# Patient Record
Sex: Male | Born: 1970
Health system: Southern US, Community
[De-identification: ages and names within clinical notes are randomized; demographics above are authoritative.]

## PROBLEM LIST (undated history)

## (undated) DIAGNOSIS — M1990 Unspecified osteoarthritis, unspecified site: Secondary | ICD-10-CM

## (undated) DIAGNOSIS — I10 Essential (primary) hypertension: Secondary | ICD-10-CM

## (undated) DIAGNOSIS — I219 Acute myocardial infarction, unspecified: Secondary | ICD-10-CM

## (undated) DIAGNOSIS — F419 Anxiety disorder, unspecified: Secondary | ICD-10-CM

## (undated) DIAGNOSIS — R51 Headache: Secondary | ICD-10-CM

## (undated) DIAGNOSIS — N289 Disorder of kidney and ureter, unspecified: Secondary | ICD-10-CM

## (undated) DIAGNOSIS — Z8639 Personal history of other endocrine, nutritional and metabolic disease: Secondary | ICD-10-CM

## (undated) DIAGNOSIS — K219 Gastro-esophageal reflux disease without esophagitis: Secondary | ICD-10-CM

## (undated) DIAGNOSIS — I2510 Atherosclerotic heart disease of native coronary artery without angina pectoris: Secondary | ICD-10-CM

## (undated) DIAGNOSIS — E119 Type 2 diabetes mellitus without complications: Secondary | ICD-10-CM

## (undated) DIAGNOSIS — R519 Headache, unspecified: Secondary | ICD-10-CM

## (undated) DIAGNOSIS — I509 Heart failure, unspecified: Secondary | ICD-10-CM

## (undated) DIAGNOSIS — Z9289 Personal history of other medical treatment: Secondary | ICD-10-CM

## (undated) DIAGNOSIS — Z8614 Personal history of Methicillin resistant Staphylococcus aureus infection: Secondary | ICD-10-CM

## (undated) DIAGNOSIS — M129 Arthropathy, unspecified: Secondary | ICD-10-CM

## (undated) HISTORY — PX: CARPAL TUNNEL RELEASE: SHX101

## (undated) HISTORY — PX: CORONARY STENT PLACEMENT: SHX1402

## (undated) HISTORY — DX: Personal history of other medical treatment: Z92.89

## (undated) HISTORY — PX: CORONARY ARTERY ANGIOPLASTY: PR CATH30428

## (undated) HISTORY — PX: CARDIAC CATHETERIZATION: SHX172

## (undated) HISTORY — PX: AMPUTATION FOOT / TOE: SUR25

## (undated) HISTORY — DX: Acute myocardial infarction, unspecified: I21.9

## (undated) HISTORY — PX: HX CATARACT REMOVAL: SHX102

---

## 2016-01-11 DIAGNOSIS — I219 Acute myocardial infarction, unspecified: Secondary | ICD-10-CM

## 2016-01-11 HISTORY — DX: Acute myocardial infarction, unspecified: I21.9

## 2017-05-03 ENCOUNTER — Other Ambulatory Visit: Payer: Self-pay

## 2017-05-03 ENCOUNTER — Other Ambulatory Visit (HOSPITAL_COMMUNITY): Payer: Self-pay

## 2017-05-04 ENCOUNTER — Emergency Department (HOSPITAL_COMMUNITY): Payer: Self-pay

## 2017-05-04 ENCOUNTER — Encounter (HOSPITAL_COMMUNITY): Payer: Self-pay

## 2017-05-04 ENCOUNTER — Inpatient Hospital Stay (HOSPITAL_COMMUNITY)
Admission: EM | Admit: 2017-05-04 | Discharge: 2017-05-06 | DRG: 247 | Disposition: A | Discharge status: Home / Self Care | Payer: Self-pay | Attending: Cardiology | Admitting: Cardiology

## 2017-05-04 DIAGNOSIS — R51 Headache: Secondary | ICD-10-CM | POA: Diagnosis not present

## 2017-05-04 DIAGNOSIS — I200 Unstable angina: Secondary | ICD-10-CM | POA: Diagnosis present

## 2017-05-04 DIAGNOSIS — Z590 Homelessness: Secondary | ICD-10-CM

## 2017-05-04 DIAGNOSIS — I25110 Atherosclerotic heart disease of native coronary artery with unstable angina pectoris: Principal | ICD-10-CM | POA: Diagnosis present

## 2017-05-04 DIAGNOSIS — I252 Old myocardial infarction: Secondary | ICD-10-CM

## 2017-05-04 DIAGNOSIS — Z955 Presence of coronary angioplasty implant and graft: Secondary | ICD-10-CM

## 2017-05-04 DIAGNOSIS — E1165 Type 2 diabetes mellitus with hyperglycemia: Secondary | ICD-10-CM | POA: Diagnosis present

## 2017-05-04 DIAGNOSIS — I10 Essential (primary) hypertension: Secondary | ICD-10-CM | POA: Diagnosis present

## 2017-05-04 DIAGNOSIS — Z888 Allergy status to other drugs, medicaments and biological substances status: Secondary | ICD-10-CM

## 2017-05-04 DIAGNOSIS — N289 Disorder of kidney and ureter, unspecified: Secondary | ICD-10-CM | POA: Diagnosis present

## 2017-05-04 DIAGNOSIS — Z880 Allergy status to penicillin: Secondary | ICD-10-CM

## 2017-05-04 DIAGNOSIS — Z8249 Family history of ischemic heart disease and other diseases of the circulatory system: Secondary | ICD-10-CM

## 2017-05-04 DIAGNOSIS — R072 Precordial pain: Secondary | ICD-10-CM | POA: Diagnosis present

## 2017-05-04 DIAGNOSIS — E785 Hyperlipidemia, unspecified: Secondary | ICD-10-CM | POA: Diagnosis present

## 2017-05-04 HISTORY — DX: Gastro-esophageal reflux disease without esophagitis: K21.9

## 2017-05-04 HISTORY — DX: Type 2 diabetes mellitus without complications: E11.9

## 2017-05-04 HISTORY — DX: Unspecified osteoarthritis, unspecified site: M19.90

## 2017-05-04 HISTORY — DX: Atherosclerotic heart disease of native coronary artery without angina pectoris: I25.10

## 2017-05-04 HISTORY — DX: Headache: R51

## 2017-05-04 HISTORY — DX: Acute myocardial infarction, unspecified: I21.9

## 2017-05-04 HISTORY — DX: Essential (primary) hypertension: I10

## 2017-05-04 HISTORY — DX: Headache, unspecified: R51.9

## 2017-05-04 HISTORY — DX: Personal history of other endocrine, nutritional and metabolic disease: Z86.39

## 2017-05-04 HISTORY — DX: Anxiety disorder, unspecified: F41.9

## 2017-05-04 HISTORY — DX: Disorder of kidney and ureter, unspecified: N28.9

## 2017-05-04 LAB — CREATININE, SERUM
CREATININE: 1.19 mg/dL (ref 0.61–1.24)
GFR calc non Af Amer: >60 mL/min (ref >60)

## 2017-05-04 LAB — BASIC METABOLIC PANEL
Anion gap: 10 (ref 5–15)
BUN: 18 mg/dL (ref 6–20)
CALCIUM: 8.9 mg/dL (ref 8.9–10.3)
CO2: 21 mmol/L — AB (ref 22–32)
CREATININE: 1.26 mg/dL — AB (ref 0.61–1.24)
Chloride: 107 mmol/L (ref 101–111)
GFR calc non Af Amer: >60 mL/min (ref >60)
Glucose, Bld: 170 mg/dL — ABNORMAL HIGH (ref 65–99)
Potassium: 3.9 mmol/L (ref 3.5–5.1)
SODIUM: 138 mmol/L (ref 135–145)

## 2017-05-04 LAB — CBC
HCT: 35.5 % — ABNORMAL LOW (ref 39.0–52.0)
HCT: 35.1 % — ABNORMAL LOW (ref 39.0–52.0)
Hemoglobin: 11.1 g/dL — ABNORMAL LOW (ref 13.0–17.0)
Hemoglobin: 11.2 g/dL — ABNORMAL LOW (ref 13.0–17.0)
MCH: 27 pg (ref 26.0–34.0)
MCH: 27.6 pg (ref 26.0–34.0)
MCHC: 31.3 g/dL (ref 30.0–36.0)
MCHC: 31.9 g/dL (ref 30.0–36.0)
MCV: 86.4 fL (ref 78.0–100.0)
MCV: 86.5 fL (ref 78.0–100.0)
PLATELETS: 439 10*3/uL — AB (ref 150–400)
PLATELETS: 440 10*3/uL — AB (ref 150–400)
RBC: 4.11 MIL/uL — ABNORMAL LOW (ref 4.22–5.81)
RBC: 4.06 MIL/uL — ABNORMAL LOW (ref 4.22–5.81)
RDW: 15.6 % — AB (ref 11.5–15.5)
RDW: 15.5 % (ref 11.5–15.5)
WBC: 11 10*3/uL — AB (ref 4.0–10.5)
WBC: 11.1 10*3/uL — AB (ref 4.0–10.5)

## 2017-05-04 LAB — TROPONIN I: Troponin I: <0.03 ng/mL (ref <0.03)

## 2017-05-04 LAB — I-STAT TROPONIN, ED
TROPONIN I, POC: 0.01 ng/mL (ref 0.00–0.08)
Troponin i, poc: 0 ng/mL (ref 0.00–0.08)

## 2017-05-04 LAB — CBG MONITORING, ED: Glucose-Capillary: 178 mg/dL — ABNORMAL HIGH (ref 65–99)

## 2017-05-04 LAB — BRAIN NATRIURETIC PEPTIDE: B Natriuretic Peptide: 517.8 pg/mL — ABNORMAL HIGH (ref 0.0–100.0)

## 2017-05-04 MED ORDER — ATORVASTATIN CALCIUM 80 MG PO TABS
80.0000 mg | ORAL_TABLET | Freq: Every day | ORAL | Status: DC
  Administered 2017-05-04 – 2017-05-06 (×2): 80 mg via ORAL
  Filled 2017-05-04 (×2): qty 1

## 2017-05-04 MED ORDER — LISINOPRIL 10 MG PO TABS
20.0000 mg | ORAL_TABLET | Freq: Every morning | ORAL | Status: DC
  Administered 2017-05-05 – 2017-05-06 (×2): 20 mg via ORAL
  Filled 2017-05-04 (×2): qty 2

## 2017-05-04 MED ORDER — ONDANSETRON HCL 4 MG/2ML IJ SOLN
4.0000 mg | Freq: Once | INTRAMUSCULAR | Status: AC
  Administered 2017-05-04: 4 mg via INTRAVENOUS
  Filled 2017-05-04: qty 2

## 2017-05-04 MED ORDER — MORPHINE SULFATE (PF) 4 MG/ML IV SOLN
4.0000 mg | Freq: Once | INTRAVENOUS | Status: AC
  Administered 2017-05-04: 4 mg via INTRAVENOUS
  Filled 2017-05-04: qty 1

## 2017-05-04 MED ORDER — TICAGRELOR 90 MG PO TABS
90.0000 mg | ORAL_TABLET | Freq: Two times a day (BID) | ORAL | Status: DC
  Administered 2017-05-04 – 2017-05-06 (×3): 90 mg via ORAL
  Filled 2017-05-04 (×3): qty 1

## 2017-05-04 MED ORDER — AMLODIPINE BESYLATE 10 MG PO TABS
10.0000 mg | ORAL_TABLET | Freq: Every day | ORAL | Status: DC
  Administered 2017-05-05 – 2017-05-06 (×2): 10 mg via ORAL
  Filled 2017-05-04 (×2): qty 1

## 2017-05-04 MED ORDER — ASPIRIN EC 81 MG PO TBEC
81.0000 mg | DELAYED_RELEASE_TABLET | Freq: Every day | ORAL | Status: DC
  Administered 2017-05-05 – 2017-05-06 (×2): 81 mg via ORAL
  Filled 2017-05-04 (×2): qty 1

## 2017-05-04 MED ORDER — NITROGLYCERIN 0.4 MG SL SUBL
0.4000 mg | SUBLINGUAL_TABLET | SUBLINGUAL | Status: DC | PRN
  Administered 2017-05-04 (×3): 0.4 mg via SUBLINGUAL

## 2017-05-04 MED ORDER — GABAPENTIN 300 MG PO CAPS
300.0000 mg | ORAL_CAPSULE | Freq: Three times a day (TID) | ORAL | Status: DC
  Administered 2017-05-04 – 2017-05-06 (×5): 300 mg via ORAL
  Filled 2017-05-04 (×5): qty 1

## 2017-05-04 MED ORDER — NITROGLYCERIN 0.4 MG SL SUBL
SUBLINGUAL_TABLET | SUBLINGUAL | Status: AC
  Filled 2017-05-04: qty 3

## 2017-05-04 MED ORDER — SODIUM CHLORIDE 0.9 % IV SOLN
INTRAVENOUS | Status: AC
  Administered 2017-05-04: 22:00:00 via INTRAVENOUS

## 2017-05-04 MED ORDER — INSULIN GLARGINE 100 UNIT/ML ~~LOC~~ SOLN
25.0000 [IU] | Freq: Every day | SUBCUTANEOUS | Status: DC
  Administered 2017-05-06: 25 [IU] via SUBCUTANEOUS
  Filled 2017-05-04 (×2): qty 0.25

## 2017-05-04 MED ORDER — METOPROLOL TARTRATE 25 MG PO TABS
100.0000 mg | ORAL_TABLET | Freq: Two times a day (BID) | ORAL | Status: DC
  Administered 2017-05-04 – 2017-05-06 (×4): 100 mg via ORAL
  Filled 2017-05-04 (×3): qty 4
  Filled 2017-05-04: qty 1

## 2017-05-04 MED ORDER — ACETAMINOPHEN 325 MG PO TABS
650.0000 mg | ORAL_TABLET | ORAL | Status: DC | PRN
  Administered 2017-05-04: 650 mg via ORAL
  Filled 2017-05-04: qty 2

## 2017-05-04 MED ORDER — INSULIN ASPART 100 UNIT/ML ~~LOC~~ SOLN
15.0000 [IU] | Freq: Three times a day (TID) | SUBCUTANEOUS | Status: DC
  Administered 2017-05-05 – 2017-05-06 (×2): 15 [IU] via SUBCUTANEOUS

## 2017-05-04 MED ORDER — SODIUM CHLORIDE 0.9 % IV SOLN: 250.0000 mL | INTRAVENOUS | Status: DC | PRN

## 2017-05-04 MED ORDER — SODIUM CHLORIDE 0.9% FLUSH: 3.0000 mL | INTRAVENOUS | Status: DC | PRN

## 2017-05-04 MED ORDER — NITROGLYCERIN IN D5W 200-5 MCG/ML-% IV SOLN
2.0000 ug/min | INTRAVENOUS | Status: DC
  Administered 2017-05-04: 5 ug/min via INTRAVENOUS
  Administered 2017-05-04: 25 ug/min via INTRAVENOUS
  Administered 2017-05-04: 20 ug/min via INTRAVENOUS
  Filled 2017-05-04 (×2): qty 250

## 2017-05-04 MED ORDER — ONDANSETRON HCL 4 MG/2ML IJ SOLN: 4.0000 mg | Freq: Four times a day (QID) | INTRAMUSCULAR | Status: DC | PRN

## 2017-05-04 MED ORDER — INSULIN GLULISINE 100 UNIT/ML IJ SOLN
15.0000 [IU] | Freq: Three times a day (TID) | INTRAMUSCULAR | Status: DC
  Filled 2017-05-04: qty 10

## 2017-05-04 MED ORDER — HEPARIN SODIUM (PORCINE) 5000 UNIT/ML IJ SOLN
5000.0000 [IU] | Freq: Three times a day (TID) | INTRAMUSCULAR | Status: DC
  Administered 2017-05-04 – 2017-05-05 (×4): 5000 [IU] via SUBCUTANEOUS
  Filled 2017-05-04 (×4): qty 1

## 2017-05-04 MED ORDER — SODIUM CHLORIDE 0.9% FLUSH
3.0000 mL | Freq: Two times a day (BID) | INTRAVENOUS | Status: DC
  Administered 2017-05-04: 3 mL via INTRAVENOUS

## 2017-05-04 MED ORDER — MORPHINE SULFATE (PF) 4 MG/ML IV SOLN
1.0000 mg | Freq: Four times a day (QID) | INTRAVENOUS | Status: DC | PRN
  Administered 2017-05-04 – 2017-05-05 (×4): 1 mg via INTRAVENOUS
  Filled 2017-05-04 (×4): qty 1

## 2017-05-04 NOTE — Progress Notes (Signed)
Chest pain obs admission.  Nigel Mormon, MD [facility] Pager: [phone] Office: [phone] If no answer Cell [phone]

## 2017-05-04 NOTE — ED Provider Notes (Addendum)
[facility] EMERGENCY DEPARTMENT Provider Note   CSN: [id number] Arrival date & time: 05/04/17  1018     History   Chief Complaint Chief Complaint  Patient presents with  . Chest Pain    HPI Aaron Pearson is a 47 y.o. male.  HPI  47 year old male with significant cardiac history including history of 6 stents placement with last stents in January 2019 brought here via EMS from home for evaluation of chest pain.  Patient reports since 10 PM last night he has had a steady pain across his chest.  He described pain as a sharp sensation with some pressure radiates to his left arm with tingling sensation down his left arm.  Endorsed nausea and vomiting this morning, felt lightheadedness, and shortness of breath but no diaphoresis.  Pain felt similar to prior cardiac related pain.  He denies any treatment tried at home.  Patient did receive 324 mg of aspirin and 3 LS nitro on route.  The pain did improve after receiving treatment, currently rates pain as 8 out of 10.  Pain seems to be worsening with movement and with exertion.  She endorsed a nonproductive cough for the past 2 to 3 days along with chills but no fever.  His cardiologist is in [facility] but patient recently moved to [place].  Past Medical History:  Diagnosis Date  . Coronary artery disease   . Diabetes mellitus without complication ([place])   . Hypertension   . Renal disorder     There are no active problems to display for this patient.   Past Surgical History:  Procedure Laterality Date  . CARPAL TUNNEL RELEASE    . CORONARY STENT PLACEMENT          Home Medications    Prior to Admission medications   Not on File    Family History No family history on file.  Social History Social History   Tobacco Use  . Smoking status: Not on file  Substance Use Topics  . Alcohol use: Not on file  . Drug use: Not on file     Allergies   Patient has no allergy information on record.   Review  of Systems Review of Systems  All other systems reviewed and are negative.    Physical Exam Updated Vital Signs SpO2 98%   Physical Exam  Constitutional: He appears well-developed and well-nourished. No distress.  HENT:  Head: Atraumatic.  Eyes: Conjunctivae are normal.  Neck: Neck supple.  Cardiovascular: Normal rate, regular rhythm, intact distal pulses and normal pulses.  Pulmonary/Chest: Effort normal and breath sounds normal. He has no decreased breath sounds. He has no wheezes. He has no rhonchi. He has no rales.  Abdominal: Soft. There is no tenderness.  Musculoskeletal:       Right lower leg: He exhibits edema.       Left lower leg: He exhibits edema.  Trace edema to bilateral lower extremities  Neurological: He is alert.  Skin: No rash noted.  Psychiatric: He has a normal mood and affect.  Nursing note and vitals reviewed.    ED Treatments / Results  Labs (all labs ordered are listed, but only abnormal results are displayed) Labs Reviewed  BASIC METABOLIC PANEL - Abnormal; Notable for the following components:      Result Value   CO2 21 (*)    Glucose, Bld 170 (*)    Creatinine, Ser 1.26 (*)    All other components within normal limits  CBC -  Abnormal; Notable for the following components:   WBC 11.0 (*)    RBC 4.11 (*)    Hemoglobin 11.1 (*)    HCT 35.5 (*)    RDW 15.6 (*)    Platelets 439 (*)    All other components within normal limits  BRAIN NATRIURETIC PEPTIDE - Abnormal; Notable for the following components:   B Natriuretic Peptide 517.8 (*)    All other components within normal limits  I-STAT TROPONIN, ED  I-STAT TROPONIN, ED    EKG EKG Interpretation  Date/Time:  Thursday May 04 2017 10:22:16 EDT Ventricular Rate:  84 PR Interval:    QRS Duration: 136 QT Interval:  432 QTC Calculation: 511 R Axis:   26 Text Interpretation:  Rbbb noted  no prior to compare.  Confirmed by Zenovia Jarred [phone]) on 05/04/2017 12:02:19  PM   Radiology Dg Chest 2 View  Result Date: 05/04/2017 CLINICAL DATA:  Chest pain since last night.  Shortness of breath. EXAM: CHEST - 2 VIEW COMPARISON:  None. FINDINGS: There is cardiomegaly and interstitial edema. No pneumothorax or effusion. Aortic atherosclerosis is noted. No acute bony abnormality. IMPRESSION: Cardiomegaly and interstitial edema. Atherosclerosis. Electronically Signed   By: Inge Rise M.D.   On: 05/04/2017 11:01    Procedures Procedures (including critical care time)  Medications Ordered in ED Medications  morphine 4 MG/ML injection 4 mg (has no administration in time range)  morphine 4 MG/ML injection 4 mg (4 mg Intravenous Given 05/04/17 1200)  ondansetron (ZOFRAN) injection 4 mg (4 mg Intravenous Given 05/04/17 1200)     Initial Impression / Assessment and Plan / ED Course  I have reviewed the triage vital signs and the nursing notes.  Pertinent labs & imaging results that were available during my care of the patient were reviewed by me and considered in my medical decision making (see chart for details).     BP (!) 165/95   Pulse 83   Resp (!) 9   SpO2 95%    Final Clinical Impressions(s) / ED Diagnoses   Final diagnoses:  Unstable angina [facility])    ED Discharge Orders    None     10:39 AM Patient with significant cardiac history, including 6 prior stenting here with chest pain similar to prior cardiac related pain.  Initial EKG without acute ischemic changes.  Patient is to endorse pain, will give morphine and Zofran.  He has had aspirin and SL nitro, will give morphine and zofran.  Care discussed with Dr. Thomasene Lot  1:39 PM Pt report minimal improvement of sxs after treatment.  However, he is sleeping and resting comfortably in NAD.  VSS.  Appreciate consultation from oncall unassigned cardiologist Dr. Erling Conte who agrees to see and evaluate pt.  He request report EKG.    Will give additional morphine for pain control.     Domenic Moras, PA-C 05/04/17 1341    Mackuen, Fredia Sorrow, MD 05/10/17 1442  ADDENDUM: CC time added  CRITICAL CARE Performed by: Domenic Moras Total critical care time: 35 minutes Critical care time was exclusive of separately billable procedures and treating other patients. Critical care was necessary to treat or prevent imminent or life-threatening deterioration. Critical care was time spent personally by me on the following activities: development of treatment plan with patient and/or surrogate as well as nursing, discussions with consultants, evaluation of patient's response to treatment, examination of patient, obtaining history from patient or surrogate, ordering and performing treatments and interventions, ordering and review of  laboratory studies, ordering and review of radiographic studies, pulse oximetry and re-evaluation of patient's condition.    Domenic Moras, PA-C 05/14/17 4451    Macarthur Critchley, MD 05/21/17 [phone]

## 2017-05-04 NOTE — H&P (Signed)
Aaron Pearson is an 47 y.o. male.   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  Current chest pain episode started 05/02/2017 evening, which is retrosternal, pressure like, radiating to left arm. Pain has continued waxing and waning since then, with some improvement with SL NTG. He states that he has had recent worsening in his chest pain with physical activity.   Patient has continued to have recurrent chest pains after his MI and multivessel PCI in 12/2017, 02/2017. [facility] of [facility]. [place] his medications from charity care [place]  Past Medical History:  Diagnosis Date  . Coronary artery disease   . Diabetes mellitus without complication ([place]  . Hypertension   . Renal disorder     Past Surgical History:  Procedure Laterality Date  . CARPAL TUNNEL RELEASE    . CORONARY STENT PLACEMENT      Family History  Problem Relation Age of Onset  . Coronary artery disease Mother   . Coronary artery disease Father    Social History:  reports that he has never smoked. He has never used smokeless tobacco. He reports that he does not drink alcohol or use drugs.  Allergies:  Allergies  Allergen Reactions  . Benadryl [Diphenhydramine]   . Penicillins     Has patient had a PCN reaction causing immediate rash, facial/tongue/throat swelling, SOB or lightheadedness with hypotension: Unknown Has patient  had a PCN reaction causing severe rash involving mucus membranes or skin necrosis: Unknown Has patient had a PCN reaction that required hospitalization: Unknown Has patient had a PCN reaction occurring within the last 10 years: No If all of the above answers are "NO", then may proceed with Cephalosporin use.       Results for orders placed or performed during the hospital encounter of 05/04/17 (from the past 48 hour(s))  Basic metabolic panel     Status: Abnormal   Collection Time: 05/04/17 11:50 AM  Result Value Ref Range   Sodium 138 135 - 145 mmol/L   Potassium 3.9 3.5 - 5.1 mmol/L   Chloride 107 101 - 111 mmol/L   CO2 21 (L) 22 - 32 mmol/L   Glucose, Bld 170 (H) 65 - 99 mg/dL   BUN 18 6 - 20 mg/dL   Creatinine, Ser 1.26 (H) 0.61 - 1.24 mg/dL   Calcium 8.9 8.9 - 10.3 mg/dL   GFR calc non Af Amer >60 >60 mL/min   GFR calc Af Amer >60 >60 mL/min    Comment: (NOTE) The eGFR has been calculated using the CKD EPI equation. This calculation has not been validated in all clinical situations. eGFR's persistently <60 mL/min signify possible Chronic Kidney Disease.    Anion gap 10 5 - 15    Comment: Performed at [facility] [place] [id number]     Status: Abnormal   Collection Time: 05/04/17 11:50 AM  Result Value Ref Range  WBC 11.0 (H) 4.0 - 10.5 K/uL   RBC 4.11 (L) 4.22 - 5.81 MIL/uL   Hemoglobin 11.1 (L) 13.0 - 17.0 g/dL   HCT 35.5 (L) 39.0 - 52.0 %   MCV 86.4 78.0 - 100.0 fL   MCH 27.0 26.0 - 34.0 pg   MCHC 31.3 30.0 - 36.0 g/dL   RDW 15.6 (H) 11.5 - 15.5 %   Platelets 439 (H) 150 - 400 K/uL    Comment: Performed at [facility] [address] [place], [place] [address]  Brain natriuretic peptide     Status: Abnormal   Collection Time: 05/04/17 11:50 AM  Result Value Ref Range   B Natriuretic Peptide 517.8 (H) 0.0 - 100.0 pg/mL    Comment: Performed at [facility] [address] [place], [place] [address]  I-stat  troponin, ED     Status: None   Collection Time: 05/04/17 12:11 PM  Result Value Ref Range   Troponin i, poc 0.01 0.00 - 0.08 ng/mL   Comment 3            Comment: Due to the release kinetics of cTnI, a negative result within the first hours of the onset of symptoms does not rule out myocardial infarction with certainty. If myocardial infarction is still suspected, repeat the test at appropriate intervals.   I-stat troponin, ED     Status: None   Collection Time: 05/04/17  2:29 PM  Result Value Ref Range   Troponin i, poc 0.00 0.00 - 0.08 ng/mL   Comment 3            Comment: Due to the release kinetics of cTnI, a negative result within the first hours of the onset of symptoms does not rule out myocardial infarction with certainty. If myocardial infarction is still suspected, repeat the test at appropriate intervals.   CBC     Status: Abnormal   Collection Time: 05/04/17  3:04 PM  Result Value Ref Range   WBC 11.1 (H) 4.0 - 10.5 K/uL   RBC 4.06 (L) 4.22 - 5.81 MIL/uL   Hemoglobin 11.2 (L) 13.0 - 17.0 g/dL   HCT 35.1 (L) 39.0 - 52.0 %   MCV 86.5 78.0 - 100.0 fL   MCH 27.6 26.0 - 34.0 pg   MCHC 31.9 30.0 - 36.0 g/dL   RDW 15.5 11.5 - 15.5 %   Platelets 440 (H) 150 - 400 K/uL    Comment: Performed at [facility], [place] [address], [place], [place] [address]  Creatinine, serum     Status: None   Collection Time: 05/04/17  3:04 PM  Result Value Ref Range   Creatinine, Ser 1.19 0.61 - 1.24 mg/dL   GFR calc non Af Amer >60 >60 mL/min   GFR calc Af Amer >60 >60 mL/min    Comment: (NOTE) The eGFR has been calculated using the CKD EPI equation. This calculation has not been validated in all clinical situations. eGFR's persistently <60 mL/min signify possible Chronic Kidney Disease. Performed at [facility], [place] [address] [place],  [address]    Dg Chest 2 View  Result Date: 05/04/2017 CLINICAL DATA:  Chest pain since last night.  Shortness of breath.  EXAM: CHEST - 2 VIEW COMPARISON:  None. FINDINGS: There is cardiomegaly and interstitial edema. No pneumothorax or effusion. Aortic atherosclerosis is noted. No acute bony abnormality. IMPRESSION: Cardiomegaly and interstitial edema. Atherosclerosis. Electronically Signed   By: Inge Rise M.D.  On: 05/04/2017 11:01    Review of Systems  Constitutional: Positive for malaise/fatigue.  Eyes: Negative.   Respiratory: Positive for shortness of breath. Negative for cough and sputum production.   Cardiovascular: Positive for chest pain and leg swelling.  Gastrointestinal: Negative for abdominal pain, nausea and vomiting.  Genitourinary: Negative.   Musculoskeletal:       Chest wall pain  Skin: Negative.   All other systems reviewed and are negative.   Blood pressure (!) 157/90, pulse 85, temperature 98.5 F (36.9 C), resp. rate 12, SpO2 93 %. Physical Exam  Nursing note and vitals reviewed. Constitutional: He is oriented to person, place, and time. He appears well-developed and well-nourished.  HENT:  Head: Atraumatic.  Eyes: Pupils are equal, round, and reactive to light. Conjunctivae are normal.  Neck: Normal range of motion. Neck supple. No JVD present.  Cardiovascular: Normal rate, regular rhythm and normal heart sounds.  No murmur heard. Respiratory: Effort normal. He has no wheezes. He has no rales.  GI: Soft. Bowel sounds are normal. There is no tenderness. There is no rebound.  Musculoskeletal: He exhibits edema (2+).  Lymphadenopathy:    He has no cervical adenopathy.  Neurological: He is alert and oriented to person, place, and time.  Skin: Skin is dry.  Psychiatric: He has a normal mood and affect.     EKG 05/04/2017: Sinus rhythm RBBB. No ischemic changes.  Assessment: 47 y/o Caucasian male  Chest pain: Atypical but partial NTG responsive. CAD s/p MI with staged multivessel PCI at [facility] 12/2016, 01/2017 Uncontrolled  DM Hypertension Hyperlipidemia  Plan: Obs admission for chest pain PO and IV NTG as needed Patient has had multiple [facility] and hospital [facility] since his MI with chest pain. Given his extensive CAD, his pre test probability is high. Coronary angiogram will be the only definitve evaluation to establish if he has any early restenosis or not. Will proceed with cath tomorrow am.  Continue ASA/britlina/amlodipine/metoprolol/lisinopril/insulin   Nigel Mormon, MD 05/04/2017, 5:08 PM  Jonea Bukowski Esther Hardy, MD [facility] [place] Pager: [phone] Office: [phone] If no answer Cell [phone]

## 2017-05-04 NOTE — ED Notes (Signed)
Pt eating [place] tray and tolerating well.

## 2017-05-04 NOTE — ED Notes (Addendum)
Dr. Francee Piccolo contacted. Pt refuses nitro gtt or nitro sl. Requesting food and pain medication. Pyt then states he will take sl nitro.

## 2017-05-04 NOTE — ED Notes (Signed)
Pt asssisted standing at bedside to void. Request that I step away to void.

## 2017-05-04 NOTE — ED Notes (Signed)
Nitro titrated by previous nurse.

## 2017-05-04 NOTE — ED Triage Notes (Signed)
Pt presents via [facility] for evaluation of CP starting today. Pt with hx of 6 stent placement, last stent in January 2019. Pt given 324 ASA and 3 nitro in route.

## 2017-05-05 ENCOUNTER — Encounter (HOSPITAL_COMMUNITY): Payer: Self-pay | Admitting: General Practice

## 2017-05-05 ENCOUNTER — Other Ambulatory Visit: Payer: Self-pay

## 2017-05-05 ENCOUNTER — Inpatient Hospital Stay (HOSPITAL_COMMUNITY): Payer: Self-pay

## 2017-05-05 ENCOUNTER — Inpatient Hospital Stay (HOSPITAL_COMMUNITY)
Admission: EM | Disposition: A | Discharge status: Home / Self Care | Payer: Self-pay | Source: Home / Self Care | Attending: Cardiology

## 2017-05-05 DIAGNOSIS — R079 Chest pain, unspecified: Secondary | ICD-10-CM

## 2017-05-05 DIAGNOSIS — I200 Unstable angina: Secondary | ICD-10-CM | POA: Diagnosis present

## 2017-05-05 HISTORY — PX: LEFT HEART CATH AND CORONARY ANGIOGRAPHY: CATH118249

## 2017-05-05 HISTORY — PX: CORONARY STENT INTERVENTION: CATH118234

## 2017-05-05 HISTORY — PX: INTRAVASCULAR PRESSURE WIRE/FFR STUDY: CATH118243

## 2017-05-05 HISTORY — PX: CARDIAC CATHETERIZATION: SHX172

## 2017-05-05 LAB — GLUCOSE, CAPILLARY
GLUCOSE-CAPILLARY: 159 mg/dL — AB (ref 65–99)
Glucose-Capillary: 183 mg/dL — ABNORMAL HIGH (ref 65–99)
Glucose-Capillary: 189 mg/dL — ABNORMAL HIGH (ref 65–99)

## 2017-05-05 LAB — HIV ANTIBODY (ROUTINE TESTING W REFLEX): HIV Screen 4th Generation wRfx: NONREACTIVE

## 2017-05-05 LAB — POCT ACTIVATED CLOTTING TIME
ACTIVATED CLOTTING TIME: 444 s
ACTIVATED CLOTTING TIME: 439 s
Activated Clotting Time: 230 seconds

## 2017-05-05 LAB — LIPID PANEL
CHOL/HDL RATIO: 5.2 ratio
Cholesterol: 152 mg/dL (ref 0–200)
HDL: 29 mg/dL — ABNORMAL LOW (ref >40)
LDL Cholesterol: 90 mg/dL (ref 0–99)
Triglycerides: 163 mg/dL — ABNORMAL HIGH (ref <150)
VLDL: 33 mg/dL (ref 0–40)

## 2017-05-05 LAB — PROTIME-INR
INR: 0.98
Prothrombin Time: 12.9 seconds (ref 11.4–15.2)

## 2017-05-05 LAB — ECHOCARDIOGRAM COMPLETE
HEIGHTINCHES: 69 in
WEIGHTICAEL: 3372.8 [oz_av]

## 2017-05-05 LAB — MRSA PCR SCREENING: MRSA by PCR: POSITIVE — AB

## 2017-05-05 SURGERY — LEFT HEART CATH AND CORONARY ANGIOGRAPHY
Anesthesia: LOCAL

## 2017-05-05 MED ORDER — HEPARIN (PORCINE) IN NACL 1000-0.9 UT/500ML-% IV SOLN
INTRAVENOUS | Status: AC
  Filled 2017-05-05: qty 1000

## 2017-05-05 MED ORDER — MIDAZOLAM HCL 2 MG/2ML IJ SOLN
INTRAMUSCULAR | Status: AC
  Filled 2017-05-05: qty 2

## 2017-05-05 MED ORDER — HYDRALAZINE HCL 20 MG/ML IJ SOLN: 5.0000 mg | INTRAMUSCULAR | Status: DC | PRN

## 2017-05-05 MED ORDER — SODIUM CHLORIDE 0.9% FLUSH: 3.0000 mL | INTRAVENOUS | Status: DC | PRN

## 2017-05-05 MED ORDER — FENTANYL CITRATE (PF) 100 MCG/2ML IJ SOLN
INTRAMUSCULAR | Status: DC | PRN
  Administered 2017-05-05 (×4): 50 ug via INTRAVENOUS

## 2017-05-05 MED ORDER — HEPARIN SODIUM (PORCINE) 1000 UNIT/ML IJ SOLN
INTRAMUSCULAR | Status: AC
  Filled 2017-05-05: qty 1

## 2017-05-05 MED ORDER — NITROGLYCERIN 1 MG/10 ML FOR IR/CATH LAB
INTRA_ARTERIAL | Status: AC
  Filled 2017-05-05: qty 10

## 2017-05-05 MED ORDER — TICAGRELOR 90 MG PO TABS
ORAL_TABLET | ORAL | Status: AC
  Filled 2017-05-05: qty 1

## 2017-05-05 MED ORDER — MUPIROCIN 2 % EX OINT
1.0000 "application " | TOPICAL_OINTMENT | Freq: Two times a day (BID) | CUTANEOUS | Status: DC
  Administered 2017-05-05 – 2017-05-06 (×2): 1 via NASAL
  Filled 2017-05-05 (×2): qty 22

## 2017-05-05 MED ORDER — NITROGLYCERIN 0.4 MG SL SUBL: 0.4000 mg | SUBLINGUAL_TABLET | SUBLINGUAL | 3 refills | Status: DC | PRN

## 2017-05-05 MED ORDER — HEPARIN (PORCINE) IN NACL 2-0.9 UNITS/ML
INTRAMUSCULAR | Status: AC | PRN
  Administered 2017-05-05 (×2): 500 mL

## 2017-05-05 MED ORDER — SODIUM CHLORIDE 0.9 % IV SOLN: INTRAVENOUS | Status: DC

## 2017-05-05 MED ORDER — MUPIROCIN 2 % EX OINT: 1.0000 "application " | TOPICAL_OINTMENT | Freq: Two times a day (BID) | CUTANEOUS | 0 refills | Status: DC

## 2017-05-05 MED ORDER — TICAGRELOR 90 MG PO TABS: 90.0000 mg | ORAL_TABLET | Freq: Two times a day (BID) | ORAL | 3 refills | Status: DC

## 2017-05-05 MED ORDER — NITROGLYCERIN 1 MG/10 ML FOR IR/CATH LAB
INTRA_ARTERIAL | Status: DC | PRN
  Administered 2017-05-05 (×3): 200 ug via INTRACORONARY

## 2017-05-05 MED ORDER — FENTANYL CITRATE (PF) 100 MCG/2ML IJ SOLN
INTRAMUSCULAR | Status: AC
  Filled 2017-05-05: qty 2

## 2017-05-05 MED ORDER — LABETALOL HCL 5 MG/ML IV SOLN: 10.0000 mg | INTRAVENOUS | Status: DC | PRN

## 2017-05-05 MED ORDER — MIDAZOLAM HCL 2 MG/2ML IJ SOLN
INTRAMUSCULAR | Status: DC | PRN
  Administered 2017-05-05 (×3): 1 mg via INTRAVENOUS

## 2017-05-05 MED ORDER — CHLORHEXIDINE GLUCONATE CLOTH 2 % EX PADS
6.0000 | MEDICATED_PAD | Freq: Every day | CUTANEOUS | Status: DC
  Administered 2017-05-05: 6 via TOPICAL

## 2017-05-05 MED ORDER — ONDANSETRON HCL 4 MG/2ML IJ SOLN: 4.0000 mg | Freq: Four times a day (QID) | INTRAMUSCULAR | Status: DC | PRN

## 2017-05-05 MED ORDER — VERAPAMIL HCL 2.5 MG/ML IV SOLN
INTRAVENOUS | Status: DC | PRN
  Administered 2017-05-05: 5 mL via INTRA_ARTERIAL

## 2017-05-05 MED ORDER — ADENOSINE 12 MG/4ML IV SOLN
INTRAVENOUS | Status: AC
  Filled 2017-05-05: qty 16

## 2017-05-05 MED ORDER — HEPARIN SODIUM (PORCINE) 1000 UNIT/ML IJ SOLN
INTRAMUSCULAR | Status: DC | PRN
  Administered 2017-05-05: 5000 [IU] via INTRAVENOUS
  Administered 2017-05-05: 2000 [IU] via INTRAVENOUS
  Administered 2017-05-05: 4000 [IU] via INTRAVENOUS
  Administered 2017-05-05: 5000 [IU] via INTRAVENOUS

## 2017-05-05 MED ORDER — SODIUM CHLORIDE 0.9 % IV SOLN: 250.0000 mL | INTRAVENOUS | Status: DC | PRN

## 2017-05-05 MED ORDER — TICAGRELOR 90 MG PO TABS
ORAL_TABLET | ORAL | Status: DC | PRN
  Administered 2017-05-05: 90 mg via ORAL

## 2017-05-05 MED ORDER — SODIUM CHLORIDE 0.9% FLUSH
3.0000 mL | Freq: Two times a day (BID) | INTRAVENOUS | Status: DC
  Administered 2017-05-05: 3 mL via INTRAVENOUS

## 2017-05-05 MED ORDER — LIDOCAINE HCL (PF) 1 % IJ SOLN
INTRAMUSCULAR | Status: AC
  Filled 2017-05-05: qty 30

## 2017-05-05 MED ORDER — LIDOCAINE HCL (PF) 1 % IJ SOLN
INTRAMUSCULAR | Status: DC | PRN
  Administered 2017-05-05: 2 mL via SUBCUTANEOUS

## 2017-05-05 MED ORDER — VERAPAMIL HCL 2.5 MG/ML IV SOLN
INTRAVENOUS | Status: AC
  Filled 2017-05-05: qty 2

## 2017-05-05 MED ORDER — CHLORHEXIDINE GLUCONATE CLOTH 2 % EX PADS: 6.0000 | MEDICATED_PAD | Freq: Every day | CUTANEOUS | 2 refills | Status: DC

## 2017-05-05 MED ORDER — ACETAMINOPHEN 325 MG PO TABS
650.0000 mg | ORAL_TABLET | ORAL | Status: DC | PRN
  Administered 2017-05-05 – 2017-05-06 (×3): 650 mg via ORAL
  Filled 2017-05-05 (×3): qty 2

## 2017-05-05 MED ORDER — IOHEXOL 350 MG/ML SOLN
INTRAVENOUS | Status: DC | PRN
  Administered 2017-05-05: 185 mL via INTRA_ARTERIAL

## 2017-05-05 MED ORDER — SODIUM CHLORIDE 0.9 % IV SOLN
INTRAVENOUS | Status: DC
  Administered 2017-05-05: 06:00:00 via INTRAVENOUS

## 2017-05-05 MED ORDER — MUPIROCIN 2 % EX OINT: 1.0000 "application " | TOPICAL_OINTMENT | Freq: Two times a day (BID) | CUTANEOUS | Status: DC

## 2017-05-05 SURGICAL SUPPLY — 24 items
BALLN SAPPHIRE 2.5X12 (BALLOONS) ×2
BALLN WOLVERINE 2.00X10 (BALLOONS) ×2
BALLN WOLVERINE 2.00X6 (BALLOONS) ×2
BALLOON SAPPHIRE 2.5X12 (BALLOONS) ×1 IMPLANT
BALLOON WOLVERINE 2.00X10 (BALLOONS) ×1 IMPLANT
BALLOON WOLVERINE 2.00X6 (BALLOONS) ×1 IMPLANT
CATH INFINITI 5 FR JL3.5 (CATHETERS) ×2 IMPLANT
CATH INFINITI JR4 5F (CATHETERS) ×2 IMPLANT
CATH LAUNCHER 6FR EBU3.5 (CATHETERS) ×2 IMPLANT
DEVICE RAD COMP TR BAND LRG (VASCULAR PRODUCTS) ×2 IMPLANT
GLIDESHEATH SLEND A-KIT 6F 22G (SHEATH) ×2 IMPLANT
GUIDEWIRE INQWIRE 1.5J.035X260 (WIRE) ×1 IMPLANT
GUIDEWIRE PRESSURE COMET II (WIRE) ×2 IMPLANT
INQWIRE 1.5J .035X260CM (WIRE) ×2
KIT ENCORE 26 ADVANTAGE (KITS) ×2 IMPLANT
KIT HEART LEFT (KITS) ×2 IMPLANT
KIT HEMO VALVE WATCHDOG (MISCELLANEOUS) ×2 IMPLANT
PACK CARDIAC CATHETERIZATION (CUSTOM PROCEDURE TRAY) ×2 IMPLANT
STENT RESOLUTE ONYX 2.75X18 (Permanent Stent) ×2 IMPLANT
STENT SIERRA 2.75 X 38 MM (Permanent Stent) ×2 IMPLANT
STENT SIERRA 3.00 X 12 MM (Permanent Stent) ×2 IMPLANT
TRANSDUCER W/STOPCOCK (MISCELLANEOUS) ×2 IMPLANT
TUBING CIL FLEX 10 FLL-RA (TUBING) ×2 IMPLANT
WIRE COUGAR XT STRL 190CM (WIRE) ×4 IMPLANT

## 2017-05-05 NOTE — Care Management Note (Addendum)
Case Management Note  Patient Details  Name: Aaron Pearson MRN: [id number] Date of Birth: 06/17/1970  Subjective/Objective:  From [employer], homeless, s/p stent intervention, he is already on brilinta he sees Morton Amy at [facility].  He got his medications filled on 3/28 , should have enough to take him thru Sunday , he will need to go to the retail pharmacy at the [facility] to get his refills on Monday morning , he gets them free there.  NCM spoke with Clarise Cruz B CSW , informed her patient is homeless , he will need bus pass for transportation.                Action/Plan: DC home when ready.   Expected Discharge Date:                  Expected Discharge Plan:  Homeless Shelter  In-House Referral:  Clinical Social Work  Discharge planning Services  CM Consult  Post Acute Care Choice:    Choice offered to:     DME Arranged:    DME Agency:     HH Arranged:    HH Agency:     Status of Service:  In process, will continue to follow  If discussed at Long Length of Stay Meetings, dates discussed:    Additional Comments:  Zenon Mayo, RN 05/05/2017, 12:21 PM

## 2017-05-05 NOTE — Progress Notes (Signed)
MD notified of pt's nitro gtt being titrated 3 times & pt given IV morphine. EKG done & in chart. Will continue to monitor the pt. Pt currently resting comfortably. Hoover Brunette, RN

## 2017-05-05 NOTE — Clinical Social Work Note (Signed)
Clinical Social Work Assessment  Patient Details  Name: Aaron Pearson MRN: [id number] Date of Birth: [birth year]  Date of referral:  05/05/17               Reason for consult:  Housing Concerns/Homelessness                Permission sought to share information with:    Permission granted to share information::  No  Name::        Agency::     Relationship::     Contact Information:     Housing/Transportation Living arrangements for the past 2 months:  [place] of Information:  Patient, Medical Team Patient Interpreter Needed:  None Criminal Activity/Legal Involvement Pertinent to Current Situation/Hospitalization:  No - Comment as needed Significant Relationships:  Adult Children Lives with:  Self Do you feel safe going back to the place where you live?  Yes Need for family participation in patient care:  Yes (Comment)  Care giving concerns:  Homelessness.   Social Worker assessment / plan:  CSW met with patient. No supports at bedside. CSW introduced role and inquired about housing situation. Patient confirmed he has been staying at the [employer] and plans to return at discharge. He is agreeable to receiving resources. CSW provided shelter list, community resources for emergency assistance, and booklets with food pantries and free meals in [place]. Patient also requesting a bus pass. Bus pass put on front of chart. RN aware. No further concerns. CSW signing off as social work intervention is no longer needed.   Employment status:  Unemployed Forensic scientist:  Self Pay (Medicaid Pending) PT Recommendations:  Not assessed at this time Information / Referral to community resources:  Shelter, Other (Comment Required)(Community and food resources, bus pass.)  Patient/Family's Response to care:  Patient agreeable to receiving resources. Patient's support system unknown but he has a daughter listed as emergency . Patient appreciated social work  intervention.  Patient/Family's Understanding of and Emotional Response to Diagnosis, Current Treatment, and Prognosis:  Patient has a good understanding of the reason for admission and social work consult. Patient appears pleased with hospital care.  Emotional Assessment Appearance:  Appears stated age Attitude/Demeanor/Rapport:  Engaged, Gracious Affect (typically observed):  Accepting, Appropriate, Calm, Pleasant Orientation:  Oriented to Self, Oriented to Place, Oriented to  Time, Oriented to Situation Alcohol / Substance use:  Never Used Psych involvement (Current and /or in the community):  No (Comment)  Discharge Needs  Concerns to be addressed:  No discharge needs identified Readmission within the last 30 days:  No Current discharge risk:  Homeless Barriers to Discharge:  Continued Medical Work up   Candie Chroman, LCSW 05/05/2017, 3:42 PM

## 2017-05-05 NOTE — CV Procedure (Signed)
LCx and prox-mid LAD PCI Full note to follow.  Nigel Mormon, MD [facility] [place] Pager: [phone] Office: [phone] If no answer Cell [phone]

## 2017-05-05 NOTE — Progress Notes (Signed)
[facility] BAND REMOVAL  LOCATION:    right radial  DEFLATED PER PROTOCOL:    Yes.    TIME BAND OFF / DRESSING APPLIED:    1500   SITE UPON ARRIVAL:    Level 0  SITE AFTER BAND REMOVAL:    Level 0  CIRCULATION SENSATION AND MOVEMENT:    Within Normal Limits   Yes.    COMMENTS:   Rechecked during shift with no change in assessment

## 2017-05-05 NOTE — Progress Notes (Signed)
Subjective:  Feels better Chest pain improved  Objective:  Vital Signs in the last 24 hours: Temp:  [97.5 F (36.4 C)-98.7 F (37.1 C)] 97.6 F (36.4 C) (04/26 1749) Pulse Rate:  [78-103] 78 (04/26 1749) Resp:  [0-37] 17 (04/26 1749) BP: (119-180)/(68-126) 146/75 (04/26 1749) SpO2:  [0 %-100 %] 98 % (04/26 1400) Weight:  [95.2 kg (209 lb 14.4 oz)-95.6 kg (210 lb 12.8 oz)] 95.6 kg (210 lb 12.8 oz) (04/26 0505)  Intake/Output from previous day: 04/25 0701 - 04/26 0700 In: -  Out: 1200 [Urine:1200] Intake/Output from this shift: No intake/output data recorded.  Physical Exam: Nursing note and vitals reviewed. Constitutional: He is oriented to person, place, and time. He appears well-developed and well-nourished.  HENT:  Head: Atraumatic.  Eyes: Pupils are equal, round, and reactive to light. Conjunctivae are normal.  Neck: Normal range of motion. Neck supple. No JVD present.  Cardiovascular: Normal rate, regular rhythm and normal heart sounds.  No murmur heard. Right radial site with no bleeding, hematoma. Good capillary refill.  Respiratory: Effort normal. He has no wheezes. He has no rales.  GI: Soft. Bowel sounds are normal. There is no tenderness. There is no rebound.  Musculoskeletal: He exhibits trace edema.  Lymphadenopathy:    He has no cervical adenopathy.  Neurological: He is alert and oriented to person, place, and time.  Skin: Skin is dry.  Psychiatric: He has a normal mood and affect.       Lab Results: Recent Labs    05/04/17 1150 05/04/17 1504  WBC 11.0* 11.1*  HGB 11.1* 11.2*  PLT 439* 440*   Recent Labs    05/04/17 1150 05/04/17 1504  NA 138  --   K 3.9  --   CL 107  --   CO2 21*  --   GLUCOSE 170*  --   BUN 18  --   CREATININE 1.26* 1.19   Recent Labs    05/04/17 2047  TROPONINI <0.03   Hepatic Function Panel No results for input(s): PROT, ALBUMIN, AST, ALT, ALKPHOS, BILITOT, BILIDIR, IBILI in the last 72 hours. Recent Labs   05/05/17 0418  CHOL 152     Cardiac Studies: Cath 05/05/2017 LM: Normal LAD: 75% mid LAD type C stenosis. Resting Pd/Pa 0.84. Prior stents patent with mild 20% ISR in prox LAD stent        Successful PCI w/overlapping stents Xience 2.75 X 38 mm & Xience 3.0 X 12 mm DES with 0% residual stenosis LCx: OM2 90% stenosis. Successful PCI w/Resolute DES 2.75 X 18 mm        Distal LCx with 7% ISR> Cutting balloon angioplasty with 40% residual stenosis RCA: Patent Distal RCA stent. RPL with severe diffuse disease, best treated medically         Mild RPDA disease  Recommendation: Continue DAPT for at least 1 year Aggressive management of risk factors including type 2 DM.  Echocardiogram 05/05/2017: Study Conclusions  - Left ventricle: The cavity size was normal. Wall thickness was   increased in a pattern of mild LVH. Systolic function was normal.   The estimated ejection fraction was in the range of 50% to 55%.   Wall motion was normal; there were no regional wall motion   abnormalities. Doppler parameters are consistent with a   reversible restrictive pattern, indicative of decreased left   ventricular diastolic compliance and/or increased left atrial   pressure (grade 3 diastolic dysfunction). - Left atrium: The atrium was moderately dilated.  Assessment: Aaron Pearson  Unstable angina: S/p two vessel PCI (LAD and LCx) CAD s/p MI with staged multivessel PCI at [facility] 12/2016, 01/2017 Uncontrolled DM Hypertension Hyperlipidemia  Plan: Wean off IC NTG DAPT for at least one year Continue insulin, amlodipine, lisinopril, metoprolol, lipitor. Case management and social work consulted. He lives in homeless shelter and gets free medications through [facility] transitional care.   Plan to discharge 04/27     LOS: 0 days    [name] J [name] 05/05/2017, 7:17 PM  Adeel Guiffre Esther Hardy, MD [facility]. [place] Pager: [phone] Office:  [phone] If no answer Cell [phone]

## 2017-05-05 NOTE — Interval H&P Note (Signed)
History and Physical Interval Note:  05/05/2017 7:32 AM  Aaron Pearson  has presented today for surgery, with the diagnosis of cp  The various methods of treatment have been discussed with the patient and family. After consideration of risks, benefits and other options for treatment, the patient has consented to  Procedure(s): LEFT HEART CATH AND CORONARY ANGIOGRAPHY (N/A) as a surgical intervention .  The patient's history has been reviewed, patient examined, no change in status, stable for surgery.  I have reviewed the patient's chart and labs.  Questions were answered to the patient's satisfaction.    2016 Appropriate Use Criteria for Coronary Revascularization in Patients With Acute Coronary Syndrome NSTEMI/UA High Risk (TIMI Score 5-7)  NSTEMI/Unstable angina, stabilized patient at high risk Link Here: sistemancia.com Indication:  Revascularization by PCI or CABG of 1 or more arteries in a patient with NSTEMI or unstable angina with Stabilization after presentation High risk for clinical events  A (7) Indication: 16; Score 7     [name]

## 2017-05-06 LAB — CBC
HCT: 31.5 % — ABNORMAL LOW (ref 39.0–52.0)
Hemoglobin: 10.2 g/dL — ABNORMAL LOW (ref 13.0–17.0)
MCH: 27.6 pg (ref 26.0–34.0)
MCHC: 32.4 g/dL (ref 30.0–36.0)
MCV: 85.4 fL (ref 78.0–100.0)
PLATELETS: 413 10*3/uL — AB (ref 150–400)
RBC: 3.69 MIL/uL — AB (ref 4.22–5.81)
RDW: 15.7 % — AB (ref 11.5–15.5)
WBC: 11.9 10*3/uL — AB (ref 4.0–10.5)

## 2017-05-06 LAB — BASIC METABOLIC PANEL
Anion gap: 8 (ref 5–15)
BUN: 16 mg/dL (ref 6–20)
CALCIUM: 9 mg/dL (ref 8.9–10.3)
CO2: 24 mmol/L (ref 22–32)
CREATININE: 1.34 mg/dL — AB (ref 0.61–1.24)
Chloride: 104 mmol/L (ref 101–111)
Glucose, Bld: 147 mg/dL — ABNORMAL HIGH (ref 65–99)
Potassium: 3.9 mmol/L (ref 3.5–5.1)
SODIUM: 136 mmol/L (ref 135–145)

## 2017-05-06 LAB — GLUCOSE, CAPILLARY: Glucose-Capillary: 114 mg/dL — ABNORMAL HIGH (ref 65–99)

## 2017-05-06 MED ORDER — NITROGLYCERIN 0.4 MG SL SUBL: 0.4000 mg | SUBLINGUAL_TABLET | SUBLINGUAL | 3 refills | Status: DC

## 2017-05-06 MED ORDER — LISINOPRIL 40 MG PO TABS: 40.0000 mg | ORAL_TABLET | Freq: Every morning | ORAL | Status: DC

## 2017-05-06 MED ORDER — LISINOPRIL 40 MG PO TABS: 40.0000 mg | ORAL_TABLET | Freq: Every morning | ORAL | 3 refills | Status: DC

## 2017-05-06 MED ORDER — LISINOPRIL 10 MG PO TABS
20.0000 mg | ORAL_TABLET | Freq: Once | ORAL | Status: AC
  Administered 2017-05-06: 20 mg via ORAL

## 2017-05-06 MED ORDER — TRAMADOL HCL 50 MG PO TABS
50.0000 mg | ORAL_TABLET | Freq: Once | ORAL | Status: AC
  Administered 2017-05-06: 05:00:00 50 mg via ORAL
  Filled 2017-05-06: qty 1

## 2017-05-06 MED ORDER — ACETAMINOPHEN 325 MG PO TABS: 650.0000 mg | ORAL_TABLET | Freq: Four times a day (QID) | ORAL | Status: DC | PRN

## 2017-05-06 MED ORDER — ACETAMINOPHEN 325 MG PO TABS: 650.0000 mg | ORAL_TABLET | Freq: Four times a day (QID) | ORAL | 3 refills | Status: DC | PRN

## 2017-05-06 MED ORDER — ANGIOPLASTY BOOK
Freq: Once | Status: AC
  Administered 2017-05-06: 06:00:00
  Filled 2017-05-06: qty 1

## 2017-05-06 NOTE — Discharge Summary (Signed)
Physician Discharge Summary  Patient ID: Aaron Pearson MRN: [id number] DOB/AGE: [birth year] 47 y.o.  Admit date: 05/04/2017 Discharge date: 05/06/2017  Admission Diagnoses: Chest pain CAD Hypertension Diabetes Melitus Hyperlipidemia H/o tricuspid valve endocarditis, resolved  Discharge Diagnoses:  Unstable angina: Successful two vessel PCI CAD Hypertension Diabetes Melitus Hyperlipidemia H/o tricuspid valve endocarditis, resolved  Discharged Condition: stable  Hospital Course:   47 year old male with coronary artery disease status postMI in 12/2016 requiring multivessel staged PCI (Mulltilink minivision 2.0X12 mm, Integrity BMS 2.25X30mm, 2.25X65mm OM2, Xience Sierra 3.5 X 12 mm RCA, 2.5X12 mm dLAD, 3.0X18 mm mid LAD, 3.5X 12 mm prox LAD), 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  Although he did not have significant EKG changes or cardiac biomarker elevation, he continued to have anginal pain similar to his previous MI, and was nitrate responsive. He has been reported to have chest wall pain in the past. However, due to nitrate responsiveness and his high CAD risk factors, he underwent coronayr angiograpy that showed patent stents with no or minimal restenosis, except small LCx stent with 75% restensis, focal 90% OM stenosis, physiologically significant mid LAD disease, diffuse RPLA and moderate RPDA disease. He underwent PCI to focal OM stenosis, PTCA to prior small LCx stent, and two overlapping stents to mid LAD (Details below).  He tolerated the procedure well. He reported headache during the hospital stay without any neorological deficits or signs of increased intracranial pressure. Headache is not new and present at baseline. I increased his lisinopril to 40 mg for better blood pressure control.   Fortunately he does get medications through  tranitional care clinic. Management of type 2 DM is critical in treating his very aggressive coronary artery disease. He reports not smoking. His overall situation remains complex with his homelessness.    Finally, I tried to contact his daughter, but was unable to reach her on phone. His one daughter and son live in [place], [place] and three tounger kids live with his ex-wife in [place].   He was seen by cardiac rehab. However I am not sure if he will be able to follow up with them outpatient due to transportation issues. I will see him for follow up in a week in post MI clinic. I recommend continued follow up with his Cardiologist at [facility] Dr. Atilano Median and PCP Dr. Geryl Rankins I will forward dishcarge summary to them.    Consults: None  Significant Diagnostic Studies:  Study Conclusions  - Left ventricle: The cavity size was normal. Wall thickness was   increased in a pattern of mild LVH. Systolic function was normal.   The estimated ejection fraction was in the range of 50% to 55%.   Wall motion was normal; there were no regional wall motion   abnormalities. Doppler parameters are consistent with a   reversible restrictive pattern, indicative of decreased left   ventricular diastolic compliance and/or increased left atrial   pressure (grade 3 diastolic dysfunction). - Left atrium: The atrium was moderately dilated.  Treatments:  Cath 05/04/2017 LM: Normal LAD: 75% mid LAD type C stenosis. Resting Pd/Pa 0.84. Prior stents patent with mild 20% ISR in prox LAD stent        Successful PCI w/overlapping stents Xience 2.75 X 38 mm & Xience 3.0 X 12 mm DES with 0% residual stenosis LCx: OM2 90% stenosis. Successful PCI w/Resolute DES 2.75 X 18 mm  Distal LCx with 7% ISR> Cutting balloon angioplasty with 40% residual stenosis RCA: Patent Distal RCA stent. RPL with severe diffuse disease, best treated medically         Mild RPDA disease  Recommendation: Continue DAPT for at least 1  year Aggressive management of risk factors including type 2 DM.  Coronary Diagrams   Diagnostic Diagram       Post-Intervention Diagram          Discharge Exam: Blood pressure (!) 151/90, pulse 70, temperature 98.6 F (37 C), temperature source Oral, resp. rate 14, height 5\' 9"  (1.753 m), weight 97.9 kg (215 lb 13.3 oz), SpO2 97 %. Nursing noteand vitalsreviewed. Constitutional: He isoriented to person, place, and time. He appearswell-developedand well-nourished.  HENT:  Head:Atraumatic.  Eyes:Pupils are equal, round, and reactive to light.Conjunctivaeare normal.  Neck:Normal range of motion.Neck supple.No JVDpresent.  Cardiovascular:Normal rate,regular rhythmand normal heart sounds. No murmurheard. Right radial site with no bleeding, hematoma. Good capillary refill.  Respiratory:Effort normal. He hasno wheezes. He hasno rales.  KP:TWSF.Bowel sounds are normal. There isno tenderness. There isno rebound.  Musculoskeletal: He exhibitstrace edema.  Lymphadenopathy:  He has no cervical adenopathy.  Neurological: He isalertand oriented to person, place, and time.  Skin: Skin isdry.  Psychiatric: He has anormal mood and affect.   Disposition:   Discharge Instructions    Amb Referral to Cardiac Rehabilitation   Complete by:  As directed    Diagnosis:  Coronary Stents   Diet - low sodium heart healthy   Complete by:  As directed    Increase activity slowly   Complete by:  As directed      Allergies as of 05/06/2017      Reactions   Benadryl [diphenhydramine]    Penicillins    Has patient had a PCN reaction causing immediate rash, facial/tongue/throat swelling, SOB or lightheadedness with hypotension: Unknown Has patient had a PCN reaction causing severe rash involving mucus membranes or skin necrosis: Unknown Has patient had a PCN reaction that required hospitalization: Unknown Has patient had a PCN reaction occurring within the last 10  years: No If all of the above answers are "NO", then may proceed with Cephalosporin use.      Medication List    TAKE these medications   acetaminophen 325 MG tablet Commonly known as:  TYLENOL Take 2 tablets (650 mg total) by mouth every 6 (six) hours as needed for moderate pain or headache.   amLODipine 10 MG tablet Commonly known as:  NORVASC Take 10 mg by mouth daily.   aspirin EC 81 MG tablet Take 81 mg by mouth daily.   atorvastatin 80 MG tablet Commonly known as:  LIPITOR Take 80 mg by mouth daily at 6 PM.   gabapentin 300 MG capsule Commonly known as:  NEURONTIN Take 600 mg by mouth 3 (three) times daily.   Insulin Glargine 300 UNIT/ML Sopn Inject 25 Units into the skin daily.   Insulin Glulisine 100 UNIT/ML Solostar Pen Commonly known as:  APIDRA Inject 15 Units into the skin 3 (three) times daily before meals.   lisinopril 40 MG tablet Commonly known as:  PRINIVIL,ZESTRIL Take 1 tablet (40 mg total) by mouth every morning. What changed:    medication strength  how much to take   metoprolol tartrate 100 MG tablet Commonly known as:  LOPRESSOR Take 100 mg by mouth 2 (two) times daily.   mupirocin ointment 2 % Commonly known as:  BACTROBAN Place 1 application into the nose 2 (  two) times daily.   nitroGLYCERIN 0.4 MG SL tablet Commonly known as:  NITROSTAT Place 1 tablet (0.4 mg total) under the tongue every 5 (five) minutes as needed for chest pain. What changed:  You were already taking a medication with the same name, and this prescription was added. Make sure you understand how and when to take each.   nitroGLYCERIN 0.4 MG SL tablet Commonly known as:  NITROSTAT Place 1 tablet (0.4 mg total) under the tongue See admin instructions. Place 1 tablet (0.4mg ) under tongue every 5 minutes as needed for chest pain. Call doctor/ 911 if take 2 doses. Max 3/Day What changed:  Another medication with the same name was added. Make sure you understand how and  when to take each.   ticagrelor 90 MG Tabs tablet Commonly known as:  BRILINTA Take 90 mg by mouth 2 (two) times daily. What changed:  Another medication with the same name was added. Make sure you understand how and when to take each.   ticagrelor 90 MG Tabs tablet Commonly known as:  BRILINTA Take 1 tablet (90 mg total) by mouth 2 (two) times daily. What changed:  You were already taking a medication with the same name, and this prescription was added. Make sure you understand how and when to take each.      Follow-up Information    Neblett, Jaci Carrel, NP Follow up on 05/15/2017.   Specialty:  Family Medicine Why:  10:30 for hospital follow up, they help him with his medications also Contact information: [address] [place] [address] [phone]        Nigel Mormon, MD Follow up on 05/17/2017.   Specialty:  Cardiology Why:  12:30 PM Contact information: [place] [address] [phone]           Signed: Nigel Mormon 05/06/2017, 1:24 PM   Nigel Mormon, MD [facility] [place] Pager: [phone] Office: [phone] If no answer Cell [phone]

## 2017-05-06 NOTE — Progress Notes (Signed)
CARDIAC REHAB PHASE I   PRE:  Rate/Rhythm: 80 SR  BP:  Sitting: 142/94      SaO2: 94% RA  MODE:  Ambulation: 400 ft   POST:  Rate/Rhythm: 92 SR  BP:  Sitting: 155/87      SaO2: 94% RA  Pt's BP pre ambulation was slightly elevated. Pt stated he had a headache, but wanted to walk. Pt had not ate any of his breakfast yet. Pt ambulated 400 ft. Gait was unsteady at beginning of walk. During walk pt expressed concern about discharge and having to be outside until shelter opened back up at 5pm and wanted Dr. Radene Ou. Pt encouraged to speak with Dr. About getting note to be allowed to return to shelter earlier than 5pm. Pt returned to bed per pt's request. Began education. Reviewed NTG use, restrictions, exercise guidelines, heart healthy/diabetic nutrition, stent cards, PTCA/Stent procedure Booklet and antiplatelet medications. Pt was very distracted when reviewing education due to unique living situation. Provided emotional support to patient. Pt is interested in phase II CR, but is not sure if that is a reasonable program due to him not having consistent food sources and insurance. Will send referral to [facility], per pt request. Pt will think about it and decided once he receives call.   [phone]  Carma Lair MS, ACSM CEP  8:46 AM 05/06/2017

## 2017-05-08 MED FILL — Heparin Sod (Porcine)-NaCl IV Soln 1000 Unit/500ML-0.9%: INTRAVENOUS | Qty: 1000 | Status: AC

## 2017-05-08 NOTE — Progress Notes (Signed)
NCM received email from Richardson Landry at [facility] stating the patient's scripts were sent to them.  NCM called [facility], informed them that NCM does not know why scripts were sent there when note states patient goes to [facility] called patient he states he will be going to pick up his meds on Friday in [place], he states he has enough meds to get him thru til then.  NCM called patient's pharmacy at [phone], spoke with pharmacist and gave her the [facility] phone number to call so the scripts can be transferred to patient's pharmacy.

## 2017-05-09 ENCOUNTER — Emergency Department (HOSPITAL_COMMUNITY): Payer: Self-pay

## 2017-05-09 ENCOUNTER — Inpatient Hospital Stay (HOSPITAL_COMMUNITY)
Admission: EM | Admit: 2017-05-09 | Discharge: 2017-05-11 | DRG: 280 | Disposition: A | Discharge status: Home / Self Care | Payer: Self-pay | Attending: Cardiology | Admitting: Cardiology

## 2017-05-09 ENCOUNTER — Encounter (HOSPITAL_COMMUNITY): Payer: Self-pay | Admitting: Emergency Medicine

## 2017-05-09 DIAGNOSIS — T82855A Stenosis of coronary artery stent, initial encounter: Secondary | ICD-10-CM | POA: Diagnosis present

## 2017-05-09 DIAGNOSIS — I2510 Atherosclerotic heart disease of native coronary artery without angina pectoris: Secondary | ICD-10-CM | POA: Diagnosis present

## 2017-05-09 DIAGNOSIS — Z888 Allergy status to other drugs, medicaments and biological substances status: Secondary | ICD-10-CM

## 2017-05-09 DIAGNOSIS — R748 Abnormal levels of other serum enzymes: Secondary | ICD-10-CM | POA: Diagnosis present

## 2017-05-09 DIAGNOSIS — E1140 Type 2 diabetes mellitus with diabetic neuropathy, unspecified: Secondary | ICD-10-CM | POA: Diagnosis present

## 2017-05-09 DIAGNOSIS — I110 Hypertensive heart disease with heart failure: Secondary | ICD-10-CM | POA: Diagnosis present

## 2017-05-09 DIAGNOSIS — F419 Anxiety disorder, unspecified: Secondary | ICD-10-CM | POA: Diagnosis present

## 2017-05-09 DIAGNOSIS — Z794 Long term (current) use of insulin: Secondary | ICD-10-CM

## 2017-05-09 DIAGNOSIS — I214 Non-ST elevation (NSTEMI) myocardial infarction: Principal | ICD-10-CM | POA: Diagnosis present

## 2017-05-09 DIAGNOSIS — Z880 Allergy status to penicillin: Secondary | ICD-10-CM

## 2017-05-09 DIAGNOSIS — Z9861 Coronary angioplasty status: Secondary | ICD-10-CM

## 2017-05-09 DIAGNOSIS — I5033 Acute on chronic diastolic (congestive) heart failure: Secondary | ICD-10-CM | POA: Diagnosis present

## 2017-05-09 DIAGNOSIS — Z79899 Other long term (current) drug therapy: Secondary | ICD-10-CM

## 2017-05-09 DIAGNOSIS — I249 Acute ischemic heart disease, unspecified: Secondary | ICD-10-CM

## 2017-05-09 DIAGNOSIS — I252 Old myocardial infarction: Secondary | ICD-10-CM

## 2017-05-09 DIAGNOSIS — Q250 Patent ductus arteriosus: Secondary | ICD-10-CM

## 2017-05-09 DIAGNOSIS — Z8249 Family history of ischemic heart disease and other diseases of the circulatory system: Secondary | ICD-10-CM

## 2017-05-09 DIAGNOSIS — M170 Bilateral primary osteoarthritis of knee: Secondary | ICD-10-CM | POA: Diagnosis present

## 2017-05-09 DIAGNOSIS — Y838 Other surgical procedures as the cause of abnormal reaction of the patient, or of later complication, without mention of misadventure at the time of the procedure: Secondary | ICD-10-CM | POA: Diagnosis present

## 2017-05-09 DIAGNOSIS — K219 Gastro-esophageal reflux disease without esophagitis: Secondary | ICD-10-CM | POA: Diagnosis present

## 2017-05-09 DIAGNOSIS — E785 Hyperlipidemia, unspecified: Secondary | ICD-10-CM | POA: Diagnosis present

## 2017-05-09 DIAGNOSIS — E1165 Type 2 diabetes mellitus with hyperglycemia: Secondary | ICD-10-CM | POA: Diagnosis present

## 2017-05-09 DIAGNOSIS — Z7982 Long term (current) use of aspirin: Secondary | ICD-10-CM

## 2017-05-09 DIAGNOSIS — R072 Precordial pain: Secondary | ICD-10-CM | POA: Diagnosis present

## 2017-05-09 LAB — BASIC METABOLIC PANEL
Anion gap: 10 (ref 5–15)
BUN: 13 mg/dL (ref 6–20)
CO2: 23 mmol/L (ref 22–32)
Calcium: 9.1 mg/dL (ref 8.9–10.3)
Chloride: 105 mmol/L (ref 101–111)
Creatinine, Ser: 1.62 mg/dL — ABNORMAL HIGH (ref 0.61–1.24)
GFR calc Af Amer: 58 mL/min — ABNORMAL LOW (ref >60)
GFR calc non Af Amer: 50 mL/min — ABNORMAL LOW (ref >60)
Glucose, Bld: 105 mg/dL — ABNORMAL HIGH (ref 65–99)
Potassium: 3.5 mmol/L (ref 3.5–5.1)
Sodium: 138 mmol/L (ref 135–145)

## 2017-05-09 LAB — I-STAT TROPONIN, ED: Troponin i, poc: 0.23 ng/mL (ref 0.00–0.08)

## 2017-05-09 LAB — CBC
HCT: 33.1 % — ABNORMAL LOW (ref 39.0–52.0)
Hemoglobin: 10.7 g/dL — ABNORMAL LOW (ref 13.0–17.0)
MCH: 27.7 pg (ref 26.0–34.0)
MCHC: 32.3 g/dL (ref 30.0–36.0)
MCV: 85.8 fL (ref 78.0–100.0)
Platelets: 451 10*3/uL — ABNORMAL HIGH (ref 150–400)
RBC: 3.86 MIL/uL — ABNORMAL LOW (ref 4.22–5.81)
RDW: 15.9 % — ABNORMAL HIGH (ref 11.5–15.5)
WBC: 11 10*3/uL — ABNORMAL HIGH (ref 4.0–10.5)

## 2017-05-09 LAB — PROTIME-INR
INR: 0.97
Prothrombin Time: 12.8 seconds (ref 11.4–15.2)

## 2017-05-09 MED ORDER — GABAPENTIN 300 MG PO CAPS
600.0000 mg | ORAL_CAPSULE | Freq: Three times a day (TID) | ORAL | Status: DC
  Administered 2017-05-10 – 2017-05-11 (×3): 600 mg via ORAL
  Filled 2017-05-09 (×4): qty 2

## 2017-05-09 MED ORDER — METOPROLOL TARTRATE 100 MG PO TABS
100.0000 mg | ORAL_TABLET | Freq: Two times a day (BID) | ORAL | Status: DC
  Administered 2017-05-09 – 2017-05-11 (×4): 100 mg via ORAL
  Filled 2017-05-09 (×3): qty 1
  Filled 2017-05-09: qty 2
  Filled 2017-05-09 (×2): qty 1

## 2017-05-09 MED ORDER — AMLODIPINE BESYLATE 10 MG PO TABS
10.0000 mg | ORAL_TABLET | Freq: Every day | ORAL | Status: DC
  Administered 2017-05-11: 10 mg via ORAL
  Filled 2017-05-09: qty 1

## 2017-05-09 MED ORDER — INSULIN ASPART 100 UNIT/ML ~~LOC~~ SOLN
15.0000 [IU] | Freq: Three times a day (TID) | SUBCUTANEOUS | Status: DC
  Administered 2017-05-11: 15 [IU] via SUBCUTANEOUS

## 2017-05-09 MED ORDER — INSULIN GLARGINE 100 UNIT/ML ~~LOC~~ SOLN
25.0000 [IU] | Freq: Every day | SUBCUTANEOUS | Status: DC
Start: 2017-05-10 — End: 2017-05-11
  Administered 2017-05-10 – 2017-05-11 (×2): 25 [IU] via SUBCUTANEOUS
  Filled 2017-05-09 (×2): qty 0.25

## 2017-05-09 MED ORDER — MUPIROCIN 2 % EX OINT
1.0000 "application " | TOPICAL_OINTMENT | Freq: Two times a day (BID) | CUTANEOUS | Status: DC
  Administered 2017-05-10 – 2017-05-11 (×2): 1 via NASAL
  Filled 2017-05-09 (×3): qty 22

## 2017-05-09 MED ORDER — MORPHINE SULFATE (PF) 4 MG/ML IV SOLN
4.0000 mg | Freq: Once | INTRAVENOUS | Status: AC
Start: 2017-05-09 — End: 2017-05-09
  Administered 2017-05-09: 4 mg via INTRAVENOUS
  Filled 2017-05-09: qty 1

## 2017-05-09 MED ORDER — ACETAMINOPHEN 325 MG PO TABS
650.0000 mg | ORAL_TABLET | ORAL | Status: DC | PRN
  Administered 2017-05-10: 650 mg via ORAL
  Filled 2017-05-09: qty 2

## 2017-05-09 MED ORDER — ACETAMINOPHEN 325 MG PO TABS: 650.0000 mg | ORAL_TABLET | Freq: Four times a day (QID) | ORAL | Status: DC | PRN

## 2017-05-09 MED ORDER — ASPIRIN EC 81 MG PO TBEC
81.0000 mg | DELAYED_RELEASE_TABLET | Freq: Every day | ORAL | Status: DC
  Administered 2017-05-10 – 2017-05-11 (×2): 81 mg via ORAL
  Filled 2017-05-09 (×2): qty 1

## 2017-05-09 MED ORDER — INSULIN ASPART 100 UNIT/ML ~~LOC~~ SOLN: 0.0000 [IU] | Freq: Three times a day (TID) | SUBCUTANEOUS | Status: DC

## 2017-05-09 MED ORDER — NITROGLYCERIN 0.4 MG SL SUBL
0.4000 mg | SUBLINGUAL_TABLET | SUBLINGUAL | Status: DC | PRN
  Administered 2017-05-10 (×2): 0.4 mg via SUBLINGUAL
  Filled 2017-05-09: qty 1

## 2017-05-09 MED ORDER — ONDANSETRON HCL 4 MG/2ML IJ SOLN: 4.0000 mg | Freq: Four times a day (QID) | INTRAMUSCULAR | Status: DC | PRN

## 2017-05-09 MED ORDER — HEPARIN (PORCINE) IN NACL 100-0.45 UNIT/ML-% IJ SOLN
1600.0000 [IU]/h | INTRAMUSCULAR | Status: DC
  Administered 2017-05-09: 1250 [IU]/h via INTRAVENOUS
  Administered 2017-05-10: 1600 [IU]/h via INTRAVENOUS
  Filled 2017-05-09 (×3): qty 250

## 2017-05-09 MED ORDER — ASPIRIN EC 81 MG PO TBEC: 81.0000 mg | DELAYED_RELEASE_TABLET | Freq: Every day | ORAL | Status: DC

## 2017-05-09 MED ORDER — LISINOPRIL 40 MG PO TABS
40.0000 mg | ORAL_TABLET | Freq: Every morning | ORAL | Status: DC
  Administered 2017-05-10 – 2017-05-11 (×2): 40 mg via ORAL
  Filled 2017-05-09 (×2): qty 1

## 2017-05-09 MED ORDER — TICAGRELOR 90 MG PO TABS
90.0000 mg | ORAL_TABLET | Freq: Two times a day (BID) | ORAL | Status: DC
  Administered 2017-05-10 – 2017-05-11 (×4): 90 mg via ORAL
  Filled 2017-05-09 (×4): qty 1

## 2017-05-09 MED ORDER — ATORVASTATIN CALCIUM 80 MG PO TABS
80.0000 mg | ORAL_TABLET | Freq: Every day | ORAL | Status: DC
  Filled 2017-05-09 (×2): qty 1

## 2017-05-09 MED ORDER — SODIUM CHLORIDE 0.9 % IV BOLUS
500.0000 mL | Freq: Once | INTRAVENOUS | Status: AC
  Administered 2017-05-09: 500 mL via INTRAVENOUS

## 2017-05-09 MED ORDER — MORPHINE SULFATE (PF) 4 MG/ML IV SOLN
4.0000 mg | Freq: Once | INTRAVENOUS | Status: AC
  Administered 2017-05-09: 4 mg via INTRAVENOUS
  Filled 2017-05-09: qty 1

## 2017-05-09 MED ORDER — HEPARIN BOLUS VIA INFUSION
4000.0000 [IU] | Freq: Once | INTRAVENOUS | Status: AC
  Administered 2017-05-09: 4000 [IU] via INTRAVENOUS
  Filled 2017-05-09: qty 4000

## 2017-05-09 NOTE — ED Notes (Signed)
[place], [place] @ [phone]

## 2017-05-09 NOTE — ED Provider Notes (Signed)
[facility] EMERGENCY DEPARTMENT Provider Note   CSN: [id number] Arrival date & time: 05/09/17  2216     History   Chief Complaint Chief Complaint  Patient presents with  . Chest Pain    HPI Aaron Pearson is a 47 y.o. male.  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 He is 5 days s/p PCI x 3 (2 overlapping) by Dr. Virgina Jock.  Patient reports onset of a throbbing central chest pain 1.5 hours ago.  He describes the pain as "moving across my chest".  He reports feeling short of breath which is aggravated by "moving around".  He is reports feeling clammy at the beginning of his symptoms, though EMS reports no associated diaphoresis.  No history of nausea or vomiting since onset of chest pain.  He had some similar chest discomfort yesterday evening which was relieved by 1 tablet of sublingual nitroglycerin allowing him to go to sleep.  He was given 2 tablets of nitroglycerin today by EMS with no improvement to his chest pain.  Patient also received 324 mg aspirin prior to arrival.  Patient reports compliance with his daily medications including aspirin and Brilinta.  He is not on any other anticoagulation.  No recent fevers or associated syncope, new or worsening leg swelling.     Past Medical History:  Diagnosis Date  . Anxiety   . 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 Coronary artery disease   . Diabetes mellitus without complication ([place])   . GERD (gastroesophageal reflux disease)   . Headache   . Hx of diabetic neuropathy   . Hypertension   . Myocardial infarction ([place]) 2018  . Renal disorder     Patient Active Problem List   Diagnosis Date Noted  . Unstable angina ([place]) 05/05/2017  . Chest pain 05/04/2017    Past Surgical History:  Procedure Laterality Date  . CARDIAC  CATHETERIZATION  05/05/2017  . CARPAL TUNNEL RELEASE    . CORONARY STENT INTERVENTION N/A 05/05/2017   Procedure: CORONARY STENT INTERVENTION;  Surgeon: Nigel Mormon, MD;  Location: [facility];  Service: Cardiovascular;  Laterality: N/A;  . CORONARY STENT PLACEMENT    . INTRAVASCULAR PRESSURE WIRE/FFR STUDY N/A 05/05/2017   Procedure: INTRAVASCULAR PRESSURE WIRE/FFR STUDY;  Surgeon: Nigel Mormon, MD;  Location: [facility];  Service: Cardiovascular;  Laterality: N/A;  . LEFT HEART CATH AND CORONARY ANGIOGRAPHY N/A 05/05/2017   Procedure: LEFT HEART CATH AND CORONARY ANGIOGRAPHY;  Surgeon: Nigel Mormon, MD;  Location: [facility];  Service: Cardiovascular;  Laterality: N/A;        Home Medications    Prior to Admission medications   Medication Sig Start Date End Date Taking? Authorizing Provider  acetaminophen (TYLENOL) 325 MG tablet Take 2 tablets (650 mg total) by mouth every 6 (six) hours as needed for moderate pain or headache. 05/06/17   Patwardhan, Manish J, MD  amLODipine (NORVASC) 10 MG tablet Take 10 mg by mouth daily. 04/06/17   [provider]  aspirin EC 81 MG tablet Take 81 mg by mouth daily. 04/06/17   [provider]  atorvastatin (LIPITOR) 80 MG tablet Take 80 mg by mouth daily at 6 PM. 04/06/17 04/06/18  [provider]  gabapentin (NEURONTIN) 300 MG capsule Take 600  mg by mouth 3 (three) times daily.  04/07/17   [provider]  Insulin Glargine 300 UNIT/ML SOPN Inject 25 Units into the skin daily. 03/14/17   [provider]  Insulin Glulisine (APIDRA) 100 UNIT/ML Solostar Pen Inject 15 Units into the skin 3 (three) times daily before meals. 03/14/17 03/14/18  [provider]  lisinopril (PRINIVIL,ZESTRIL) 40 MG tablet Take 1 tablet (40 mg total) by mouth every morning. 05/06/17   Patwardhan, Reynold Bowen, MD  metoprolol tartrate (LOPRESSOR) 100 MG tablet Take 100 mg by mouth 2 (two) times  daily. 04/06/17 05/06/17  [provider]  mupirocin ointment (BACTROBAN) 2 % Place 1 application into the nose 2 (two) times daily. 05/05/17   Patwardhan, Reynold Bowen, MD  nitroGLYCERIN (NITROSTAT) 0.4 MG SL tablet Place 1 tablet (0.4 mg total) under the tongue every 5 (five) minutes as needed for chest pain. 05/05/17   Patwardhan, Reynold Bowen, MD  nitroGLYCERIN (NITROSTAT) 0.4 MG SL tablet Place 1 tablet (0.4 mg total) under the tongue See admin instructions. Place 1 tablet (0.4mg ) under tongue every 5 minutes as needed for chest pain. Call doctor/ 911 if take 2 doses. Max 3/Day 05/06/17   Patwardhan, Reynold Bowen, MD  ticagrelor (BRILINTA) 90 MG TABS tablet Take 90 mg by mouth 2 (two) times daily. 04/06/17 07/05/17  [provider]  ticagrelor (BRILINTA) 90 MG TABS tablet Take 1 tablet (90 mg total) by mouth 2 (two) times daily. 05/05/17   Patwardhan, Reynold Bowen, MD    Family History Family History  Problem Relation Age of Onset  . Coronary artery disease Mother   . Coronary artery disease Father     Social History Social History   Tobacco Use  . Smoking status: Never Smoker  . Smokeless tobacco: Never Used  Substance Use Topics  . Alcohol use: Never    Frequency: Never  . Drug use: Never     Allergies   Benadryl [diphenhydramine] and Penicillins   Review of Systems Review of Systems Ten systems reviewed and are negative for acute change, except as noted in the HPI.    Physical Exam Updated Vital Signs BP 119/77 (BP Location: Right Arm)   Pulse 78   Temp 98.2 F (36.8 C) (Oral)   Resp 16   SpO2 94%   Physical Exam  Constitutional: He is oriented to person, place, and time. He appears well-developed and well-nourished. No distress.  Nonotoxic appearing and in NAD  HENT:  Head: Normocephalic and atraumatic.  Eyes: Conjunctivae and EOM are normal. No scleral icterus.  Neck: Normal range of motion.  Cardiovascular: Normal rate, regular rhythm and intact distal  pulses.  Pulmonary/Chest: Effort normal. No stridor. No respiratory distress. He has no wheezes. He has no rales.  Lungs CTAB. Respirations even and unlabored.  Abdominal: He exhibits no mass. There is no tenderness. There is no guarding.  Soft, distended, obese. No rigidity.  Musculoskeletal: Normal range of motion.  No BLE pitting edema.  Neurological: He is alert and oriented to person, place, and time. He exhibits normal muscle tone. Coordination normal.  Skin: Skin is warm and dry. No rash noted. He is not diaphoretic. No erythema. No pallor.  Psychiatric: He has a normal mood and affect. His behavior is normal.  Nursing note and vitals reviewed.    ED Treatments / Results  Labs (all labs ordered are listed, but only abnormal results are displayed) Labs Reviewed  CBC - Abnormal; Notable for the following components:  Result Value   WBC 11.0 (*)    RBC 3.86 (*)    Hemoglobin 10.7 (*)    HCT 33.1 (*)    RDW 15.9 (*)    Platelets 451 (*)    All other components within normal limits  I-STAT TROPONIN, ED - Abnormal; Notable for the following components:   Troponin i, poc 0.23 (*)    All other components within normal limits  PROTIME-INR  BASIC METABOLIC PANEL  RAPID URINE DRUG SCREEN, HOSP PERFORMED  TROPONIN I  HEPARIN LEVEL (UNFRACTIONATED)  TROPONIN I  TROPONIN I  TROPONIN I  CBC    EKG EKG Interpretation  Date/Time:  Tuesday May 09 2017 22:33:54 EDT Ventricular Rate:  77 PR Interval:    QRS Duration: 133 QT Interval:  452 QTC Calculation: 512 R Axis:   93 Text Interpretation:  Sinus rhythm Right bundle branch block Non-specific ST-t changes Confirmed by Virgel Manifold [phone]) on 05/09/2017 11:04:47 PM   Radiology Dg Chest 2 View  Result Date: 05/09/2017 CLINICAL DATA:  Left-sided chest pain and shortness of breath beginning today. Clamminess. History of stents. History of hypertension. History of diabetes. EXAM: CHEST - 2 VIEW COMPARISON:  05/04/2017  FINDINGS: Shallow inspiration with linear atelectasis in the lung bases. Cardiac enlargement. No vascular congestion or edema. No focal consolidation. No blunting of costophrenic angles. No pneumothorax. Mediastinal contours appear intact. IMPRESSION: Cardiac enlargement. No vascular congestion, edema, or consolidation. Linear atelectasis in the lung bases with shallow inspiration. Electronically Signed   By: Lucienne Capers M.D.   On: 05/09/2017 23:10    Procedures Procedures (including critical care time)  Medications Ordered in ED Medications  sodium chloride 0.9 % bolus 500 mL (500 mLs Intravenous New Bag/Given 05/09/17 2246)  heparin bolus via infusion 4,000 Units (has no administration in time range)  heparin ADULT infusion 100 units/mL (25000 units/229mL sodium chloride 0.45%) (has no administration in time range)  morphine 4 MG/ML injection 4 mg (4 mg Intravenous Given 05/09/17 2246)    CRITICAL CARE Performed by: Antonietta Breach   Total critical care time: 40 minutes  Critical care time was exclusive of separately billable procedures and treating other patients.  Critical care was necessary to treat or prevent imminent or life-threatening deterioration.  Critical care was time spent personally by me on the following activities: development of treatment plan with patient and/or surrogate as well as nursing, discussions with consultants, evaluation of patient's response to treatment, examination of patient, obtaining history from patient or surrogate, ordering and performing treatments and interventions, ordering and review of laboratory studies, ordering and review of radiographic studies, pulse oximetry and re-evaluation of patient's condition.   Initial Impression / Assessment and Plan / ED Course  I have reviewed the triage vital signs and the nursing notes.  Pertinent labs & imaging results that were available during my care of the patient were reviewed by me and considered in my  medical decision making (see chart for details).     11:05 PM Patient with elevated troponin of 0.23. There are some ST changes in the lateral precordial leads of EKG today in comparison with EKG post stenting. Hx of PCI to focal OM stenosis, PTCA to prior small LCx stent, and two overlapping stents to mid LAD on 05/05/17. Heparin ordered for ACS. Will consult cardiology.  11:13 PM Case discussed with Dr. Einar Gip of Cardiology who will place orders for observation admission. Will trend troponin level to determine degree of any necessary future interventions.  11:21 PM  Patient updated on lab results and need for admission. Patient with improvement in chest pain with morphine. Still c/o active chest pain, however. Additional dose of morphine ordered. VSS.   Final Clinical Impressions(s) / ED Diagnoses   Final diagnoses:  ACS (acute coronary syndrome) [facility])    ED Discharge Orders    None       Antonietta Breach, PA-C 05/09/17 2325    Virgel Manifold, MD 05/11/17 2248

## 2017-05-09 NOTE — ED Triage Notes (Signed)
Patient arrived with EMS from [facility] reports pain across his chest this evening with mild SOB , no nausea or diaphoresis , received 324 mg ASA and 2 NTG sl by EMS with no relief , rates pain 8/10 , history of CAD / Coronary stents.

## 2017-05-09 NOTE — Progress Notes (Addendum)
ANTICOAGULATION CONSULT NOTE - Initial Consult  Pharmacy Consult for heparin Indication: chest pain/ACS  Allergies  Allergen Reactions  . Benadryl [Diphenhydramine]   . Penicillins     Has patient had a PCN reaction causing immediate rash, facial/tongue/throat swelling, SOB or lightheadedness with hypotension: Unknown Has patient had a PCN reaction causing severe rash involving mucus membranes or skin necrosis: Unknown Has patient had a PCN reaction that required hospitalization: Unknown Has patient had a PCN reaction occurring within the last 10 years: No If all of the above answers are "NO", then may proceed with Cephalosporin use.     Patient Measurements:   Heparin Dosing Weight: 90.4 kg  Vital Signs: Temp: 98.2 F (36.8 C) (04/30 2236) Temp Source: Oral (04/30 2236) BP: 119/77 (04/30 2236) Pulse Rate: 78 (04/30 2236)  Labs: Recent Labs    05/09/17 2225  HGB 10.7*  HCT 33.1*  PLT 451*    Estimated Creatinine Clearance: 81.2 mL/min (A) (by C-G formula based on SCr of 1.34 mg/dL (H)).   Medical History: Past Medical History:  Diagnosis Date  . Anxiety   . 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 Coronary artery disease   . Diabetes mellitus without complication ([name])   . GERD (gastroesophageal reflux disease)   . Headache   . Hx of diabetic neuropathy   . Hypertension   . Myocardial infarction ([name]) 2018  . Renal disorder     Medications:  See medication history  Assessment: 47 yo man with recent PCI to focal OM stenosis, PTCA to prior small LCx stent, and two overlapping stents to mid LAD to start heparin for CP.   Goal of Therapy:  Heparin level 0.3-0.7 units/ml Monitor platelets by anticoagulation protocol: Yes   Plan:  Heparin bolus 4000 unit and drip at 1250 units/hr Check heparin level 6 hours after start Daily heparin level and CBC while on heparin Monitor for bleeding complications  Aaron Pearson 05/09/2017,11:13 PM   Addum:  Heparin level  0.14 units/ml.  Will rebolus with 2500 units and increase drip to 1600 units/hr.  Recheck heparin level in 6 hours

## 2017-05-10 ENCOUNTER — Inpatient Hospital Stay (HOSPITAL_COMMUNITY)
Admission: EM | Disposition: A | Discharge status: Home / Self Care | Payer: Self-pay | Source: Home / Self Care | Attending: Cardiology

## 2017-05-10 ENCOUNTER — Encounter (HOSPITAL_COMMUNITY): Payer: Self-pay | Admitting: Cardiology

## 2017-05-10 HISTORY — PX: LEFT HEART CATH AND CORONARY ANGIOGRAPHY: CATH118249

## 2017-05-10 HISTORY — PX: INTRAVASCULAR PRESSURE WIRE/FFR STUDY: CATH118243

## 2017-05-10 LAB — CBC
HCT: 35.6 % — ABNORMAL LOW (ref 39.0–52.0)
HEMOGLOBIN: 11.2 g/dL — AB (ref 13.0–17.0)
MCH: 27 pg (ref 26.0–34.0)
MCHC: 31.5 g/dL (ref 30.0–36.0)
MCV: 85.8 fL (ref 78.0–100.0)
PLATELETS: 353 10*3/uL (ref 150–400)
RBC: 4.15 MIL/uL — ABNORMAL LOW (ref 4.22–5.81)
RDW: 16.2 % — ABNORMAL HIGH (ref 11.5–15.5)
WBC: 8.7 10*3/uL (ref 4.0–10.5)

## 2017-05-10 LAB — BASIC METABOLIC PANEL
ANION GAP: 8 (ref 5–15)
BUN: 14 mg/dL (ref 6–20)
CALCIUM: 8.2 mg/dL — AB (ref 8.9–10.3)
CO2: 23 mmol/L (ref 22–32)
Chloride: 105 mmol/L (ref 101–111)
Creatinine, Ser: 1.31 mg/dL — ABNORMAL HIGH (ref 0.61–1.24)
GFR calc non Af Amer: >60 mL/min (ref >60)
Glucose, Bld: 201 mg/dL — ABNORMAL HIGH (ref 65–99)
Potassium: 3.7 mmol/L (ref 3.5–5.1)
Sodium: 136 mmol/L (ref 135–145)

## 2017-05-10 LAB — CBG MONITORING, ED
GLUCOSE-CAPILLARY: 105 mg/dL — AB (ref 65–99)
Glucose-Capillary: 90 mg/dL (ref 65–99)
Glucose-Capillary: 204 mg/dL — ABNORMAL HIGH (ref 65–99)

## 2017-05-10 LAB — RAPID URINE DRUG SCREEN, HOSP PERFORMED
AMPHETAMINES: NOT DETECTED
BARBITURATES: NOT DETECTED
Benzodiazepines: NOT DETECTED
Cocaine: NOT DETECTED
Opiates: POSITIVE — AB
Tetrahydrocannabinol: NOT DETECTED

## 2017-05-10 LAB — GLUCOSE, CAPILLARY: Glucose-Capillary: 190 mg/dL — ABNORMAL HIGH (ref 65–99)

## 2017-05-10 LAB — TROPONIN I
TROPONIN I: 0.35 ng/mL — AB (ref <0.03)
TROPONIN I: 0.31 ng/mL — AB (ref <0.03)
Troponin I: 0.18 ng/mL (ref <0.03)

## 2017-05-10 LAB — POCT ACTIVATED CLOTTING TIME: Activated Clotting Time: 335 seconds

## 2017-05-10 LAB — HEPARIN LEVEL (UNFRACTIONATED)
HEPARIN UNFRACTIONATED: 0.35 [IU]/mL (ref 0.30–0.70)
Heparin Unfractionated: 0.14 IU/mL — ABNORMAL LOW (ref 0.30–0.70)

## 2017-05-10 SURGERY — LEFT HEART CATH AND CORONARY ANGIOGRAPHY
Anesthesia: LOCAL

## 2017-05-10 MED ORDER — ADENOSINE 12 MG/4ML IV SOLN
INTRAVENOUS | Status: AC
  Filled 2017-05-10: qty 16

## 2017-05-10 MED ORDER — LIDOCAINE HCL (PF) 1 % IJ SOLN
INTRAMUSCULAR | Status: DC | PRN
  Administered 2017-05-10: 2 mL

## 2017-05-10 MED ORDER — SODIUM CHLORIDE 0.9 % IV SOLN
INTRAVENOUS | Status: AC
  Administered 2017-05-10 (×2): via INTRAVENOUS

## 2017-05-10 MED ORDER — IOPAMIDOL (ISOVUE-370) INJECTION 76%
INTRAVENOUS | Status: DC | PRN
  Administered 2017-05-10: 55 mL via INTRAVENOUS

## 2017-05-10 MED ORDER — NITROGLYCERIN IN D5W 200-5 MCG/ML-% IV SOLN
20.0000 ug/min | INTRAVENOUS | Status: DC
  Administered 2017-05-10: 20 ug/min via INTRAVENOUS
  Filled 2017-05-10: qty 250

## 2017-05-10 MED ORDER — VERAPAMIL HCL 2.5 MG/ML IV SOLN
INTRAVENOUS | Status: AC
  Filled 2017-05-10: qty 2

## 2017-05-10 MED ORDER — IOPAMIDOL (ISOVUE-370) INJECTION 76%
INTRAVENOUS | Status: AC
  Filled 2017-05-10: qty 100

## 2017-05-10 MED ORDER — SODIUM CHLORIDE 0.9 % IV SOLN
250.0000 mL | INTRAVENOUS | Status: DC | PRN
  Administered 2017-05-10: 250 mL via INTRAVENOUS

## 2017-05-10 MED ORDER — SODIUM CHLORIDE 0.9% FLUSH: 3.0000 mL | INTRAVENOUS | Status: DC | PRN

## 2017-05-10 MED ORDER — NITROGLYCERIN 1 MG/10 ML FOR IR/CATH LAB
INTRA_ARTERIAL | Status: AC
  Filled 2017-05-10: qty 10

## 2017-05-10 MED ORDER — SODIUM CHLORIDE 0.9 % IV SOLN
INTRAVENOUS | Status: DC | PRN
  Administered 2017-05-10: 1.75 mg/kg/h via INTRAVENOUS

## 2017-05-10 MED ORDER — HEPARIN (PORCINE) IN NACL 2-0.9 UNITS/ML
INTRAMUSCULAR | Status: AC | PRN
  Administered 2017-05-10 (×2): 500 mL

## 2017-05-10 MED ORDER — ACETAMINOPHEN 325 MG PO TABS: 650.0000 mg | ORAL_TABLET | ORAL | Status: DC | PRN

## 2017-05-10 MED ORDER — ONDANSETRON HCL 4 MG/2ML IJ SOLN: 4.0000 mg | Freq: Four times a day (QID) | INTRAMUSCULAR | Status: DC | PRN

## 2017-05-10 MED ORDER — SODIUM CHLORIDE 0.9 % IV SOLN: 250.0000 mL | INTRAVENOUS | Status: DC | PRN

## 2017-05-10 MED ORDER — HEPARIN BOLUS VIA INFUSION
2500.0000 [IU] | Freq: Once | INTRAVENOUS | Status: AC
  Administered 2017-05-10: 2500 [IU] via INTRAVENOUS
  Filled 2017-05-10: qty 2500

## 2017-05-10 MED ORDER — SODIUM CHLORIDE 0.9% FLUSH
3.0000 mL | Freq: Two times a day (BID) | INTRAVENOUS | Status: DC
  Administered 2017-05-10: 3 mL via INTRAVENOUS

## 2017-05-10 MED ORDER — BIVALIRUDIN BOLUS VIA INFUSION - CUPID
INTRAVENOUS | Status: DC | PRN
  Administered 2017-05-10: 73.425 mg via INTRAVENOUS

## 2017-05-10 MED ORDER — BIVALIRUDIN TRIFLUOROACETATE 250 MG IV SOLR
INTRAVENOUS | Status: AC
  Filled 2017-05-10: qty 250

## 2017-05-10 MED ORDER — FUROSEMIDE 10 MG/ML IJ SOLN
40.0000 mg | Freq: Two times a day (BID) | INTRAMUSCULAR | Status: DC
  Administered 2017-05-10 – 2017-05-11 (×2): 40 mg via INTRAVENOUS
  Filled 2017-05-10 (×2): qty 4

## 2017-05-10 MED ORDER — NITROGLYCERIN 1 MG/10 ML FOR IR/CATH LAB
INTRA_ARTERIAL | Status: DC | PRN
  Administered 2017-05-10: 200 ug via INTRACORONARY

## 2017-05-10 MED ORDER — FENTANYL CITRATE (PF) 100 MCG/2ML IJ SOLN
INTRAMUSCULAR | Status: AC
  Filled 2017-05-10: qty 2

## 2017-05-10 MED ORDER — MIDAZOLAM HCL 2 MG/2ML IJ SOLN
INTRAMUSCULAR | Status: AC
  Filled 2017-05-10: qty 2

## 2017-05-10 MED ORDER — MORPHINE SULFATE (PF) 4 MG/ML IV SOLN
1.0000 mg | INTRAVENOUS | Status: DC | PRN
  Administered 2017-05-10 – 2017-05-11 (×8): 1 mg via INTRAVENOUS
  Filled 2017-05-10 (×9): qty 1

## 2017-05-10 MED ORDER — ASPIRIN 81 MG PO CHEW: 81.0000 mg | CHEWABLE_TABLET | ORAL | Status: DC

## 2017-05-10 MED ORDER — NITROGLYCERIN IN D5W 200-5 MCG/ML-% IV SOLN: 2.0000 ug/min | INTRAVENOUS | Status: DC

## 2017-05-10 MED ORDER — VERAPAMIL HCL 2.5 MG/ML IV SOLN
INTRAVENOUS | Status: DC | PRN
  Administered 2017-05-10: 5 mL via INTRA_ARTERIAL

## 2017-05-10 MED ORDER — FENTANYL CITRATE (PF) 100 MCG/2ML IJ SOLN
INTRAMUSCULAR | Status: DC | PRN
  Administered 2017-05-10: 25 ug via INTRAVENOUS
  Administered 2017-05-10: 50 ug via INTRAVENOUS
  Administered 2017-05-10: 25 ug via INTRAVENOUS

## 2017-05-10 MED ORDER — LIDOCAINE HCL (PF) 1 % IJ SOLN
INTRAMUSCULAR | Status: AC
  Filled 2017-05-10: qty 30

## 2017-05-10 MED ORDER — SODIUM CHLORIDE 0.9 % IV SOLN
INTRAVENOUS | Status: DC
  Administered 2017-05-10: 15:00:00 via INTRAVENOUS

## 2017-05-10 MED ORDER — MIDAZOLAM HCL 2 MG/2ML IJ SOLN
INTRAMUSCULAR | Status: DC | PRN
  Administered 2017-05-10 (×2): 1 mg via INTRAVENOUS

## 2017-05-10 MED ORDER — HEPARIN (PORCINE) IN NACL 1000-0.9 UT/500ML-% IV SOLN
INTRAVENOUS | Status: AC
  Filled 2017-05-10: qty 1000

## 2017-05-10 MED ORDER — HEPARIN SODIUM (PORCINE) 1000 UNIT/ML IJ SOLN
INTRAMUSCULAR | Status: AC
  Filled 2017-05-10: qty 1

## 2017-05-10 MED ORDER — ADENOSINE (DIAGNOSTIC) 140MCG/KG/MIN
INTRAVENOUS | Status: DC | PRN
  Administered 2017-05-10: 140 ug/kg/min via INTRAVENOUS

## 2017-05-10 SURGICAL SUPPLY — 14 items
CATH INFINITI 5 FR JL3.5 (CATHETERS) ×2 IMPLANT
CATH LAUNCHER 6FR JR4 (CATHETERS) ×2 IMPLANT
DEVICE RAD COMP TR BAND LRG (VASCULAR PRODUCTS) ×2 IMPLANT
ELECT DEFIB PAD ADLT CADENCE (PAD) ×2 IMPLANT
GLIDESHEATH SLEND A-KIT 6F 22G (SHEATH) ×2 IMPLANT
GUIDEWIRE INQWIRE 1.5J.035X260 (WIRE) ×1 IMPLANT
GUIDEWIRE PRESSURE COMET II (WIRE) ×2 IMPLANT
INQWIRE 1.5J .035X260CM (WIRE) ×2
KIT HEART LEFT (KITS) ×2 IMPLANT
KIT HEMO VALVE WATCHDOG (MISCELLANEOUS) ×2 IMPLANT
PACK CARDIAC CATHETERIZATION (CUSTOM PROCEDURE TRAY) ×2 IMPLANT
SHEATH GLIDE SLENDER 4/5FR (SHEATH) IMPLANT
TRANSDUCER W/STOPCOCK (MISCELLANEOUS) ×2 IMPLANT
TUBING CIL FLEX 10 FLL-RA (TUBING) ×2 IMPLANT

## 2017-05-10 NOTE — H&P (Signed)
Aaron Pearson is an 47 y.o. male.   Chief Complaint: Chest pain  HPI:   47 year old Caucasian male with coronary artery disease status post multiple prior PCI's since December 2018 MI, uncontrolled type 2 diabetes mellitus, hypertension, hyperlipidemia, history of tricuspid valve endocarditis, discharged after admission for unstable angina and PCI on 05/06/2017. Patient lives in a homeless shelter but fortunately has access to free medications through transitional care clinic before S. He returned to the ED last night with complaints of right-sided pressure-like chest pain associated with palpitations. Workup showed lateral T-wave inversions with troponin elevation that peaked at 0.35 ng/mL. Patient still has chest pain on IV nitroglycerin 20 mcg/min. Endorses compliance to medications including aspirin and Brilinta.  Past Medical History:  Diagnosis Date  . Anxiety   . 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 Coronary artery disease   . Diabetes mellitus without complication ([place])   . GERD (gastroesophageal reflux disease)   . Headache   . Hx of diabetic neuropathy   . Hypertension   . Myocardial infarction ([place]) 2018  . Renal disorder     Past Surgical History:  Procedure Laterality Date  . CARDIAC CATHETERIZATION  05/05/2017  . CARPAL TUNNEL RELEASE    . CORONARY STENT INTERVENTION N/A 05/05/2017   Procedure: CORONARY STENT INTERVENTION;  Surgeon: Nigel Mormon, MD;  Location: [place] [facility];  Service: Cardiovascular;  Laterality: N/A;  . CORONARY STENT PLACEMENT    . INTRAVASCULAR PRESSURE WIRE/FFR STUDY N/A 05/05/2017   Procedure: INTRAVASCULAR PRESSURE WIRE/FFR STUDY;  Surgeon: Nigel Mormon, MD;  Location: [place] [facility];  Service: Cardiovascular;  Laterality: N/A;  . LEFT HEART CATH AND CORONARY ANGIOGRAPHY N/A 05/05/2017   Procedure: LEFT HEART CATH AND CORONARY ANGIOGRAPHY;  Surgeon: Nigel Mormon, MD;  Location: [place] [facility];  Service: Cardiovascular;   Laterality: N/A;    Family History  Problem Relation Age of Onset  . Coronary artery disease Mother   . Coronary artery disease Father    Social History:  reports that he has never smoked. He has never used smokeless tobacco. He reports that he does not drink alcohol or use drugs.  Allergies:  Allergies  Allergen Reactions  . Benadryl [Diphenhydramine] Nausea Only  . Penicillins     Childhood allergy  Has patient had a PCN reaction causing immediate rash, facial/tongue/throat swelling, SOB or lightheadedness with hypotension: Unknown Has patient had a PCN reaction causing severe rash involving mucus membranes or skin necrosis: Unknown Has patient had a PCN reaction that required hospitalization: Unknown Has patient had a PCN reaction occurring within the last 10 years: No If all of the above answers are "NO", then may proceed with Cephalosporin use.      (Not in a hospital admission)  Results for orders placed or performed during the hospital encounter of 05/09/17 (from the past 48 hour(s))  Basic metabolic panel     Status: Abnormal   Collection Time: 05/09/17 10:25 PM  Result Value Ref Range   Sodium 138 135 - 145 mmol/L   Potassium 3.5 3.5 - 5.1 mmol/L   Chloride 105 101 - 111 mmol/L   CO2 23 22 - 32 mmol/L   Glucose, Bld 105 (H) 65 - 99 mg/dL   BUN 13 6 - 20 mg/dL   Creatinine, Ser 1.62 (H) 0.61 - 1.24 mg/dL   Calcium 9.1 8.9 - 10.3 mg/dL   GFR calc non Af Amer 50 (L) >60 mL/min   GFR calc  Af Amer 58 (L) >60 mL/min    Comment: (NOTE) The eGFR has been calculated using the CKD EPI equation. This calculation has not been validated in all clinical situations. eGFR's persistently <60 mL/min signify possible Chronic Kidney Disease.    Anion gap 10 5 - 15    Comment: Performed at [facility] [address], [place], [place] [address]  CBC     Status: Abnormal   Collection Time: 05/09/17 10:25 PM  Result Value Ref Range   WBC 11.0 (H) 4.0 - 10.5 K/uL   RBC  3.86 (L) 4.22 - 5.81 MIL/uL   Hemoglobin 10.7 (L) 13.0 - 17.0 g/dL   HCT 33.1 (L) 39.0 - 52.0 %   MCV 85.8 78.0 - 100.0 fL   MCH 27.7 26.0 - 34.0 pg   MCHC 32.3 30.0 - 36.0 g/dL   RDW 15.9 (H) 11.5 - 15.5 %   Platelets 451 (H) 150 - 400 K/uL    Comment: Performed at [facility], [place] [address], [place], [place] [address]  Protime-INR (order if Patient is taking Coumadin / Warfarin)     Status: None   Collection Time: 05/09/17 10:25 PM  Result Value Ref Range   Prothrombin Time 12.8 11.4 - 15.2 seconds   INR 0.97     Comment: Performed at [facility] [address] [place], [place] [address]  I-stat troponin, ED     Status: Abnormal   Collection Time: 05/09/17 10:42 PM  Result Value Ref Range   Troponin i, poc 0.23 (HH) 0.00 - 0.08 ng/mL   Comment NOTIFIED PHYSICIAN    Comment 3            Comment: Due to the release kinetics of cTnI, a negative result within the first hours of the onset of symptoms does not rule out myocardial infarction with certainty. If myocardial infarction is still suspected, repeat the test at appropriate intervals.   Troponin I     Status: Abnormal   Collection Time: 05/09/17 11:09 PM  Result Value Ref Range   Troponin I 0.35 (HH) <0.03 ng/mL    Comment: CRITICAL RESULT CALLED TO, READ BACK BY AND VERIFIED WITH: SANGELANG B,RN 05/10/17 0004 WAYK Performed at [facility], [place] [address], [place], [place] [address]   Rapid urine drug screen (hospital performed)     Status: Abnormal   Collection Time: 05/10/17  2:55 AM  Result Value Ref Range   Opiates POSITIVE (A) NONE DETECTED   Cocaine NONE DETECTED NONE DETECTED   Benzodiazepines NONE DETECTED NONE DETECTED   Amphetamines NONE DETECTED NONE DETECTED   Tetrahydrocannabinol NONE DETECTED NONE DETECTED   Barbiturates NONE DETECTED NONE DETECTED    Comment: (NOTE) DRUG SCREEN FOR MEDICAL PURPOSES ONLY.  IF CONFIRMATION IS NEEDED FOR ANY PURPOSE, NOTIFY LAB WITHIN 5  DAYS. LOWEST DETECTABLE LIMITS FOR URINE DRUG SCREEN Drug Class                     Cutoff (ng/mL) Amphetamine and metabolites    1000 Barbiturate and metabolites    200 Benzodiazepine                 338 Tricyclics and metabolites     300 Opiates and metabolites        300 Cocaine and metabolites        300 THC  50 Performed at [facility], [place] [address], [place], [place] [address]   CBG monitoring, ED     Status: None   Collection Time: 05/10/17  3:12 AM  Result Value Ref Range   Glucose-Capillary 90 65 - 99 mg/dL  Heparin level (unfractionated)     Status: Abnormal   Collection Time: 05/10/17  5:07 AM  Result Value Ref Range   Heparin Unfractionated 0.14 (L) 0.30 - 0.70 IU/mL    Comment:        IF HEPARIN RESULTS ARE BELOW EXPECTED VALUES, AND PATIENT DOSAGE HAS BEEN CONFIRMED, SUGGEST FOLLOW UP TESTING OF ANTITHROMBIN III LEVELS. Performed at [facility], [place] [address] [place], [place] [address]   Troponin I     Status: Abnormal   Collection Time: 05/10/17  5:07 AM  Result Value Ref Range   Troponin I 0.31 (HH) <0.03 ng/mL    Comment: CRITICAL VALUE NOTED.  VALUE IS CONSISTENT WITH PREVIOUSLY REPORTED AND CALLED VALUE. Performed at [facility], [place] [address], [place], [place] [address]   CBC     Status: Abnormal   Collection Time: 05/10/17  5:07 AM  Result Value Ref Range   WBC 8.7 4.0 - 10.5 K/uL   RBC 4.15 (L) 4.22 - 5.81 MIL/uL   Hemoglobin 11.2 (L) 13.0 - 17.0 g/dL   HCT 35.6 (L) 39.0 - 52.0 %   MCV 85.8 78.0 - 100.0 fL   MCH 27.0 26.0 - 34.0 pg   MCHC 31.5 30.0 - 36.0 g/dL   RDW 16.2 (H) 11.5 - 15.5 %   Platelets 353 150 - 400 K/uL    Comment: Performed at [facility], [place] [address] [place], [place] [address]  CBG monitoring, ED     Status: Abnormal   Collection Time: 05/10/17  7:54 AM  Result Value Ref Range   Glucose-Capillary 105 (H) 65 - 99 mg/dL   Dg Chest 2 View  Result Date:  05/09/2017 CLINICAL DATA:  Left-sided chest pain and shortness of breath beginning today. [place]. History of stents. History of hypertension. History of diabetes. EXAM: CHEST - 2 VIEW COMPARISON:  05/04/2017 FINDINGS: Shallow inspiration with linear atelectasis in the lung bases. Cardiac enlargement. No vascular congestion or edema. No focal consolidation. No blunting of costophrenic angles. No pneumothorax. Mediastinal contours appear intact. IMPRESSION: Cardiac enlargement. No vascular congestion, edema, or consolidation. Linear atelectasis in the lung bases with shallow inspiration. Electronically Signed   By: Lucienne Capers M.D.   On: 05/09/2017 23:10    EKG 05/09/2017: Sinus rhythm 77 bpm. Normal axis. Right bundle branch block. Possible old inferior infarct. Lateral T-wave inversions, new compared to previous EKG on 05/06/2017  Echocardiogram 05/05/2017: Study Conclusions  - Left ventricle: The cavity size was normal. Wall thickness was   increased in a pattern of mild LVH. Systolic function was normal.   The estimated ejection fraction was in the range of 50% to 55%.   Wall motion was normal; there were no regional wall motion   abnormalities. Doppler parameters are consistent with a   reversible restrictive pattern, indicative of decreased left   ventricular diastolic compliance and/or increased left atrial   pressure (grade 3 diastolic dysfunction). - Left atrium: The atrium was moderately dilated.   Review of Systems  HENT: Negative.   Eyes:       Baseline decreased visual acuity  Respiratory: Positive for shortness of breath.   Cardiovascular: Positive for chest pain, palpitations and  leg swelling.  Gastrointestinal: Negative for abdominal pain and nausea.  Genitourinary: Negative.   Musculoskeletal: Negative.   Skin: Negative.   Neurological: Positive for headaches (Resolved). Negative for dizziness, speech change, focal weakness and loss of consciousness.   Endo/Heme/Allergies: Does not bruise/bleed easily.  Psychiatric/Behavioral: The patient is nervous/anxious.   All other systems reviewed and are negative.   Blood pressure 106/71, pulse 70, temperature 98.2 F (36.8 C), temperature source Oral, resp. rate 11, SpO2 96 %. Physical Exam  Nursing note and vitals reviewed. Constitutional: He is oriented to person, place, and time. He appears well-developed and well-nourished.  HENT:  Head: Normocephalic and atraumatic.  Eyes: Pupils are equal, round, and reactive to light. Conjunctivae are normal.  Neck: Normal range of motion. Neck supple. No JVD present.  Cardiovascular: Normal rate, regular rhythm and normal heart sounds.  No murmur heard. Respiratory: Effort normal. He has no wheezes. He has no rales.  GI: Soft. Bowel sounds are normal. There is no tenderness.  Musculoskeletal: He exhibits edema (1+ RLE, trace LLE).  Lymphadenopathy:    He has no cervical adenopathy.  Neurological: He is alert and oriented to person, place, and time. A cranial nerve deficit is present.  Skin: Skin is warm.  Psychiatric:  Anxious      Assessment/Plan: 47 y/o Caucasian male  Chest pain: NSTEMI. Trop peak 0.35 ng/mL: Based on EKG and clinical course I do not think this is due to stent thrombosis. I suspect his troponin elevation is coming from diffusely disease small PDA that is not amenable to PCI. However, there is a moderate lesion in a moderate sized RPL that supplied large amount of myocardium. Plan to cath later this afternoon. Will perform FFR of this vessel and stent if physiologically significant. Continue ASA/brilinta/heparin/IV NTG CAD s/p multiple prior PCI, most recent 05/05/2017 LCx and overlapping LAD stents Uncontrolled DM: Continue insulin Hypertension: Better controlled. Continue current antihypertensive therapy Hyperlipidemia HFpEF   Patient lives in [facility] homeless shelter. He has been seen by case management and social  work during recent admission and was provided with resources. Fortunately, he has access to free medications through transitional care clinic at [place], MD 05/10/2017, 8:41 AM   Nigel Mormon, MD [facility]. [place] Pager: [phone] Office: [phone] If no answer Cell [phone]

## 2017-05-10 NOTE — Progress Notes (Signed)
[place] for heparin Indication: chest pain/ACS  Allergies  Allergen Reactions  . Benadryl [Diphenhydramine] Nausea Only  . Penicillins     Childhood allergy  Has patient had a PCN reaction causing immediate rash, facial/tongue/throat swelling, SOB or lightheadedness with hypotension: Unknown Has patient had a PCN reaction causing severe rash involving mucus membranes or skin necrosis: Unknown Has patient had a PCN reaction that required hospitalization: Unknown Has patient had a PCN reaction occurring within the last 10 years: No If all of the above answers are "NO", then may proceed with Cephalosporin use.     Patient Measurements:   Heparin Dosing Weight: 90.4 kg  Vital Signs: BP: 113/69 (05/01 1330) Pulse Rate: 72 (05/01 1330)  Labs: Recent Labs    05/09/17 2225 05/09/17 2309 05/10/17 0507 05/10/17 1230  HGB 10.7*  --  11.2*  --   HCT 33.1*  --  35.6*  --   PLT 451*  --  353  --   LABPROT 12.8  --   --   --   INR 0.97  --   --   --   HEPARINUNFRC  --   --  0.14* 0.35  CREATININE 1.62*  --   --   --   TROPONINI  --  0.35* 0.31*  --     Estimated Creatinine Clearance: 67.2 mL/min (A) (by C-G formula based on SCr of 1.62 mg/dL (H)).  Assessment: 47 yo man with recent PCI to focal OM stenosis, PTCA to prior small LCx stent, and two overlapping stents to mid LAD continues on heparin for chest pain.  Heparin level this afternoon is in range at  0.35units/mL. Per notes, plans for cath today. CBC stable, no bleeding noted.   Goal of Therapy:  Heparin level 0.3-0.7 units/ml Monitor platelets by anticoagulation protocol: Yes   Plan:  Continue heparin at 1600 units/hr Daily heparin level and CBC while on heparin Monitor for bleeding complications  Eloise Picone D. Daric Koren, PharmD, BCPS Clinical Pharmacist Clinical Phone for 05/10/2017 until 3:30pm: [phone] If after 3:30pm, please call main pharmacy at [phone] 05/10/2017 2:13 PM

## 2017-05-10 NOTE — ED Notes (Signed)
Updated on plan of care.  

## 2017-05-10 NOTE — ED Notes (Signed)
Patient is resting with call bell in reach  

## 2017-05-10 NOTE — ED Notes (Signed)
telephone order from provider call back for 1mg  morphine PRN q 4 hr for chest pain

## 2017-05-10 NOTE — ED Notes (Signed)
Pt urinating in urinal at bedside.

## 2017-05-10 NOTE — Interval H&P Note (Signed)
History and Physical Interval Note:  05/10/2017 4:45 PM  Aaron Pearson  has presented today for surgery, with the diagnosis of cp  The various methods of treatment have been discussed with the patient and family. After consideration of risks, benefits and other options for treatment, the patient has consented to  Procedure(s): RIGHT/LEFT HEART CATH AND CORONARY ANGIOGRAPHY (N/A) as a surgical intervention .  The patient's history has been reviewed, patient examined, no change in status, stable for surgery.  I have reviewed the patient's chart and labs.  Questions were answered to the patient's satisfaction.    2016 Appropriate Use Criteria for Coronary Revascularization in Patients With Acute Coronary Syndrome NSTEMI/UA High Risk (TIMI Score 5-7)  NSTEMI/Unstable angina, stabilized patient at high risk Link Here: sistemancia.com Indication:  Revascularization by PCI or CABG of 1 or more arteries in a patient with NSTEMI or unstable angina with Stabilization after presentation High risk for clinical events  A (7) Indication: 16; Score 7      [place]

## 2017-05-10 NOTE — ED Notes (Addendum)
Spoke with Provider Dr. Virgina Jock, [facility]. Provider verbal order to modify Nitroglycerin 50mg  in dextrose 5% 280ml (0.2mg /ml) titratable dose 2-258mcg/ min to Nitroglycerin 50mg  in dextrose 5% 245mL (0.2mg /mL) , dose of 43mcg/min non-titratable dose. provider aware of latest vitals BP 113/75 and HR 71bpm.

## 2017-05-10 NOTE — ED Notes (Signed)
Breakfast tray ordered for the pt.

## 2017-05-10 NOTE — ED Notes (Signed)
Got patient up to use the urinal patient is resting with call bell in reach

## 2017-05-10 NOTE — ED Notes (Signed)
Provider paged for PRN pain meds for chest pain

## 2017-05-10 NOTE — ED Notes (Signed)
Per Cardiology. Pt can eat breakfast, nothing after 10am. Cath scheduled for later this afternoon.

## 2017-05-10 NOTE — Progress Notes (Signed)
I called patient's daughter Aaron Pearson on [phone] and informed her of the results and plan of care.  Nigel Mormon, MD [facility]. [place] Pager: [phone] Office: [phone] If no answer Cell [phone]

## 2017-05-10 NOTE — ED Notes (Signed)
Pt attempted to urinate but was unable to provide sample.

## 2017-05-10 NOTE — ED Notes (Signed)
Pt states having chest pain 9/10 central chest area. PRN nitro given, pt on cardiac moniter. Pt states morphine relives chest pain better than the nitro, also complaining of head ache.

## 2017-05-10 NOTE — ED Notes (Signed)
Attempted to call report at this time.  Receiving RN to call this RN back.  

## 2017-05-11 ENCOUNTER — Encounter (HOSPITAL_COMMUNITY): Payer: Self-pay | Admitting: Cardiology

## 2017-05-11 ENCOUNTER — Encounter: Payer: Self-pay | Admitting: Pediatric Intensive Care

## 2017-05-11 LAB — GLUCOSE, CAPILLARY
GLUCOSE-CAPILLARY: 139 mg/dL — AB (ref 65–99)
GLUCOSE-CAPILLARY: 146 mg/dL — AB (ref 65–99)
Glucose-Capillary: 110 mg/dL — ABNORMAL HIGH (ref 65–99)
Glucose-Capillary: 72 mg/dL (ref 65–99)

## 2017-05-11 MED ORDER — FUROSEMIDE 40 MG PO TABS: 20.0000 mg | ORAL_TABLET | Freq: Two times a day (BID) | ORAL | 3 refills | Status: DC

## 2017-05-11 MED ORDER — POTASSIUM CHLORIDE CRYS ER 20 MEQ PO TBCR: 20.0000 meq | EXTENDED_RELEASE_TABLET | Freq: Every day | ORAL | 3 refills | Status: DC

## 2017-05-11 MED FILL — Heparin Sod (Porcine)-NaCl IV Soln 1000 Unit/500ML-0.9%: INTRAVENOUS | Qty: 1000 | Status: AC

## 2017-05-11 MED FILL — FUROSEMIDE 40 MG TAB: 40 | 30 days supply | Qty: 30 | Fill #0

## 2017-05-11 MED FILL — POTASSIUM CL ER 20 MEQ TABL: 20 | 30 days supply | Qty: 30 | Fill #0

## 2017-05-11 NOTE — Clinical Social Work Note (Signed)
Clinical Social Work Assessment  Patient Details  Name: Aaron Pearson MRN: [id number] Date of Birth: 29-Oct-1970  Date of referral:  05/11/17               Reason for consult:  Transportation, Housing Concerns/Homelessness, [employer]                Permission sought to share information with:    Permission granted to share information::  No  Name::        Agency::     Relationship::     Contact Information:     Housing/Transportation Living arrangements for the past 2 months:  Barrister's clerk of Information:  Patient Patient Interpreter Needed:  None Criminal Activity/Legal Involvement Pertinent to Current Situation/Hospitalization:  No - Comment as needed Significant Relationships:  Adult Children Lives with:  Self Do you feel safe going back to the place where you live?  Yes Need for family participation in patient care:  No (Coment)  Care giving concerns: Patient stays in [place]. Patient medications were through clinic in [facility]. RNCM following for medication needs. CSW consulted for patient homelessness and    Social Worker assessment / plan: CSW met with patient at bedside. Patient alert and oriented. CSW assessed patient housing situation. Patient reported he stays at [facility]. Patient was previously staying at a shelter in [facility], but was only allowed to stay there for 90 days, and maxed out his time there. Patient seen by another CSW last admission and given shelter resources. This CSW provided information about [facility]). Patient familiar with [facility]. CSW provided shelter list again. CSW also provided bus passes for patient to return to shelter. CSW called shelter and confirmed patient could come back this evening after discharge. Shelter confirmed patient can come in this evening, as long as he brings AVS as proof of hospital stay. CSW signing off, as no additional needs at this time.  Employment status:   Unemployed Forensic scientist:  Self Pay (Medicaid Pending) PT Recommendations:  Not assessed at this time Information / Referral to community resources:  Shelter, Other (Comment Required)  Patient/Family's Response to care: Patient appreciative of care and resources.  Patient/Family's Understanding of and Emotional Response to Diagnosis, Current Treatment, and Prognosis: Patient with understanding of conditions and agreeable to discharge today.  Emotional Assessment Appearance:  Appears stated age Attitude/Demeanor/Rapport:  Engaged Affect (typically observed):  Appropriate, Calm, Pleasant Orientation:  Oriented to Self, Oriented to Place, Oriented to  Time, Oriented to Situation Alcohol / Substance use:  Not Applicable Psych involvement (Current and /or in the community):  No (Comment)  Discharge Needs  Concerns to be addressed:  Medication Concerns, Homelessness Readmission within the last 30 days:  Yes Current discharge risk:  Homeless Barriers to Discharge:  Continued Medical Work up   Aaron Emms, LCSW 05/11/2017, 3:25 PM

## 2017-05-11 NOTE — Progress Notes (Signed)
Patient received discharge information and acknowledged understanding of it. Patient received radial site care education. Patient received bus buses. Patient IV was removed.

## 2017-05-11 NOTE — Care Management Note (Addendum)
Case Management Note  Patient Details  Name: Aaron Pearson MRN: [id number] Date of Birth: [birth year]  Subjective/Objective: Pt presented for Chest Pain- Previously admitted for NStemi-post stent. Pt usually goes to [facility] in [facility] @ [phone] and he utilizes the Pharmacy at [facility] [phone].                    Action/Plan: Pt will be transitioned to [facility] today and he states he does not have any money to pay for medications. Per pt he had been able to get medications from above pharmacy for free. CM did call the above pharmacy and pharmacists states that pt will only got medications free 2/2 he was at that previous hospital and the pharmacy utilized the [place] for assistance. Pharmacists stated that if pt is at a different hospital pt could not utilize the fund for free medications. Lasix will cost $7.00 and $26.31 for K+. CM then called the [place] outpatient pharmacy (medications sent to this location) and lasix is $9.00 and $15.00. CM then called Opal Sidles with [facility] to discuss the case with her to see if pt may need to be changed to [facility] or if Nurse [place] from [place] could provide [employer] and assistance with medications. CM awaiting call back from Opal Sidles to see what other options pt may have in the community. Goal: Pt needs to have  PCP appointment with adequate transportation to get to appointments and be able to get medications in the community to decrease readmission.   Expected Discharge Date:  05/11/17               Expected Discharge Plan:  Homeless Shelter  In-House Referral:  Clinical Social Work  Discharge planning Services  CM Consult, Medication Assistance, [facility]  Post Acute Care Choice:  NA Choice offered to:  NA  DME Arranged:  N/A DME Agency:  NA  HH Arranged:  NA HH Agency:  NA  Status of Service:  Completed, signed off  If discussed at [employer], dates discussed:    Additional Comments: 1355 05-11-17 Jacqlyn Krauss, RN,BSN [phone] CM did get a call from [place] [facility]  ([facility] RN) in regards to disposition needs. Nurse E[place] will speak with pt this afternoon by 4:00 pm in regards to returning to [facility]. Pt will get assistance from Nurse [place] via the [place]. CM will see if the medications can be switched from [facility] to [place]. CM will call MD for paper Rx's. CSW assisting with transportation needs back to [facility]. CM will make pt/ staff RN aware of disposition needs.      Bethena Roys, RN 05/11/2017, 11:50 AM

## 2017-05-11 NOTE — Progress Notes (Signed)
Correction to H&P dated 05/10/2017: No cranial nerve deficit seen  Nigel Mormon, MD [facility]. [place] Pager: [phone] Office: [phone] If no answer Cell [phone]

## 2017-05-11 NOTE — Discharge Summary (Signed)
Physician Discharge Summary  Patient ID: Aaron Pearson MRN: [id number] DOB/AGE: February 16, 1970 47 y.o.  Admit date: 05/09/2017 Discharge date: 05/11/2017  Admission Diagnoses:  Discharge Diagnoses:  Active Problems:   Chest pain NSTEMI  Discharged Condition: good  Hospital Course:   47 year old Caucasian male with coronary artery disease status post multiple prior PCI as since December 2018 MI, uncontrolled type 2 diabetes pedis, hypertension, hyperlipidemia, history of tricuspid valve endocarditis, underwent PCI for unstable angina to his left circumflex and LAD last week. He returned to ED with chest pain and was found to have troponin elevation peaked at 0.35 ng per mL. He endorses compliance with his aspirin and Brilinta. He is recommended to undergo coronary angiography to rule out any acute stent thrombosis although unlikely, and as well as performed FFR guided stenting to his large right posterolateral branch 50% stenosis, if necessary.  Consults: None  Significant Diagnostic Studies:  Cath 05/10/2017 LM: Normal LAD: Prior prox-distal LAD stents with 20% prox LAD restenosis. Diagonal with moderate disease LCx: Patent OM2 stent with % restenosis        40% restensosis in small distal LCx OM2 90% stenosis. Successful PCI w/Resolute DES 2.75 X 18 mm RCA: Patent Distal RCA stent with 0% restenosis         RPL with FFR negative (0.91) moderate disease         Small RPDA with severe diffuse disease, best treated medically   Elevated LVEDP 30 mmHg  No stent thrombosis seen No severe restenosis seen  Coronary Diagrams   Diagnostic Diagram        NSTEMI likely occurred due to small PDA with severe diffuse disease that is not amenable to PCI. Medical management recommended. He was started on PO lasix given his HFpEF and elevated LVEDP.  Patient was chest pain free on discharge.  Treatments: Medical management of NSTEMI with heparin, ASA/brilinta.   Discharge Exam: Blood  pressure (!) 146/91, pulse 79, temperature 98.6 F (37 C), temperature source Oral, resp. rate 17, weight 94.6 kg (208 lb 8 oz), SpO2 97 %.  Nursing note and vitals reviewed. Constitutional: He is oriented to person, place, and time. He appears well-developed and well-nourished.  HENT:  Head: Normocephalic and atraumatic.  Eyes: Pupils are equal, round, and reactive to light. Conjunctivae are normal.  Neck: Normal range of motion. Neck supple. No JVD present.  Cardiovascular: Normal rate, regular rhythm and normal heart sounds.  No murmur heard. Respiratory: Effort normal. He has no wheezes. He has no rales.  GI: Soft. Bowel sounds are normal. There is no tenderness.  Musculoskeletal: He exhibits edema (1+ RLE, trace LLE).  Lymphadenopathy:    He has no cervical adenopathy.  Neurological: He is alert and oriented to person, place, and time. No cranial nerve deficit Skin: Skin is warm.  Psychiatric: Stablr     Disposition: Discharge disposition: 01-Home or Self Care       Discharge Instructions    Diet - low sodium heart healthy   Complete by:  As directed    Increase activity slowly   Complete by:  As directed      Allergies as of 05/11/2017      Reactions   Benadryl [diphenhydramine] Nausea Only   Penicillins    Childhood allergy  Has patient had a PCN reaction causing immediate rash, facial/tongue/throat swelling, SOB or lightheadedness with hypotension: Unknown Has patient had a PCN reaction causing severe rash involving mucus membranes or skin necrosis: Unknown Has patient had a PCN  reaction that required hospitalization: Unknown Has patient had a PCN reaction occurring within the last 10 years: No If all of the above answers are "NO", then may proceed with Cephalosporin use.      Medication List    TAKE these medications   acetaminophen 325 MG tablet Commonly known as:  TYLENOL Take 2 tablets (650 mg total) by mouth every 6 (six) hours as needed for moderate  pain or headache.   amLODipine 10 MG tablet Commonly known as:  NORVASC Take 10 mg by mouth daily.   aspirin EC 81 MG tablet Take 81 mg by mouth daily.   atorvastatin 80 MG tablet Commonly known as:  LIPITOR Take 80 mg by mouth daily at 6 PM.   furosemide 40 MG tablet Commonly known as:  LASIX Take 0.5 tablets (20 mg total) by mouth 2 (two) times daily.   gabapentin 300 MG capsule Commonly known as:  NEURONTIN Take 600 mg by mouth 3 (three) times daily.   Insulin Glargine 300 UNIT/ML Sopn Inject 25 Units into the skin daily.   Insulin Glulisine 100 UNIT/ML Solostar Pen Commonly known as:  APIDRA Inject 15 Units into the skin 3 (three) times daily before meals.   lisinopril 40 MG tablet Commonly known as:  PRINIVIL,ZESTRIL Take 1 tablet (40 mg total) by mouth every morning.   metoprolol tartrate 100 MG tablet Commonly known as:  LOPRESSOR Take 100 mg by mouth 2 (two) times daily.   mupirocin ointment 2 % Commonly known as:  BACTROBAN Place 1 application into the nose 2 (two) times daily.   nitroGLYCERIN 0.4 MG SL tablet Commonly known as:  NITROSTAT Place 1 tablet (0.4 mg total) under the tongue See admin instructions. Place 1 tablet (0.4mg ) under tongue every 5 minutes as needed for chest pain. Call doctor/ 911 if take 2 doses. Max 3/Day   potassium chloride SA 20 MEQ tablet Commonly known as:  K-DUR,KLOR-CON Take 1 tablet (20 mEq total) by mouth daily.   ticagrelor 90 MG Tabs tablet Commonly known as:  BRILINTA Take 1 tablet (90 mg total) by mouth 2 (two) times daily.      Follow-up Information    Nigel Mormon, MD Follow up on 05/17/2017.   Specialty:  Cardiology Why:  1:00 PM Contact information: [place]  [id number] [phone]           Signed: Nigel Mormon 05/11/2017, 8:55 AM  Nigel Mormon, MD [facility] Pager: [phone] Office: [phone] If no answer Cell  [phone]

## 2017-05-12 ENCOUNTER — Encounter: Payer: Self-pay | Admitting: Pediatric Intensive Care

## 2017-05-12 ENCOUNTER — Telehealth: Payer: Self-pay | Admitting: General Practice

## 2017-05-12 ENCOUNTER — Encounter (HOSPITAL_COMMUNITY): Payer: Self-pay | Admitting: Cardiology

## 2017-05-12 NOTE — Congregational Nurse Program (Signed)
Congregational Nurse Program Note  Date of Encounter: 05/12/2017  Past Medical History: Past Medical History:  Diagnosis Date  . Anxiety   . 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 Coronary artery disease   . Diabetes mellitus without complication ([place])   . GERD (gastroesophageal reflux disease)   . Headache   . Hx of diabetic neuropathy   . Hypertension   . Myocardial infarction ([place]) 2018  . Renal disorder     Encounter Details: CNP Questionnaire - 05/12/17 1030      Questionnaire   Patient Status  Not Applicable    Race  White or Caucasian    Location Patient Served At  [facility]  Not Applicable    Uninsured  Uninsured (Subsequent visits/quarter)    Food  Within past 12 months, worried food would run out with no money to buy more;Yes, have food insecurities    Housing/Utilities  No permanent housing    Transportation  Yes, need transportation assistance    Interpersonal Safety  No, do not feel physically and emotionally safe where you currently live    Medication  Yes, have medication insecurities    Medical Provider  Yes    Referrals  Primary Care Provider/Clinic    ED Visit Averted  Not Applicable    [facility] encounter- Client states that he has PCP appointment in [facility] on Monday however he does not have transportation. CN discussed client hospital follow up at [facility] and that the staff would be able to assist him with case management. CN reviewed medications and needs. Client has 2cm raised erythemic area on back of neck at hairline. It has a small scab but no drainage. Client is MRSA + from nasal swab in hospital and is using Bactroban as directed. Client states he has an ulcer on the bottom of his left foot. CN discussed appointment needs with client. [place] will follow up in CN clinic on MOnday.

## 2017-05-12 NOTE — Congregational Nurse Program (Signed)
Congregational Nurse Program Note  Date of Encounter: 05/11/2017  Past Medical History: Past Medical History:  Diagnosis Date  . Anxiety   . 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 Coronary artery disease   . Diabetes mellitus without complication ([place])   . GERD (gastroesophageal reflux disease)   . Headache   . Hx of diabetic neuropathy   . Hypertension   . Myocardial infarction ([place]) 2018  . Renal disorder     Encounter Details: CNP Questionnaire - 05/11/17 1645      Questionnaire   Patient Status  Not Applicable    Race  White or Caucasian    Location Patient [place]  Not Applicable    Uninsured  Uninsured (NEW 1x/quarter)    Food  Within past 12 months, worried food would run out with no money to buy more;Yes, have food insecurities    Housing/Utilities  No permanent housing    Transportation  Yes, need transportation assistance;Provided transportation assistance (bus pass, taxi voucher, etc.)    Interpersonal Safety  No, do not feel physically and emotionally safe where you currently live    Medication  Yes, have medication insecurities;Provided medication assistance    Medical Provider  Yes    Referrals  Medication Assistance;Primary Care Provider/Clinic    ED Visit Averted  Not Applicable    Life-Saving Intervention Made  Not Applicable      Met with client at [facility]. Discussed addition of lasix and potassium to existing medications for discharge. Briefly discussed CN program. Client will meet with CN in morning at [facility] to review health history, need for PCP, medications and transportation needs. Client agrees to plan.

## 2017-05-12 NOTE — Telephone Encounter (Signed)
Call received from [place], Scientist, research (physical sciences) at [facility], regarding an appointment for patient and help with his disability. Scheduled patient an appointment on Thursday 05/18/17 @ 10:10am. During our conversation [place] mentioned that unfortunately she can't help patient with transportation to [facility] and patient is aware of this . Patient has support from the pharmacy in [place] and has received his medication from them (540B) but if something were to happen, she can have his medication transferred to Outpatient pharmacy.   During our conversation [place] mentioned that patient has poor vision, which concerns her when he uses the insulin pen. [place] would also like patient to meet with [employer] rep regarding his disability as well has his medical records. Informed [place] that Verdis Frederickson, from [employer], see our patient on certain Wednesdays of the month but that I would be sending her a email to contact [place] to speak about patient's situation. I also informed [place] that I would be informing Opal Sidles, Tourist information centre manager, of our conversation.

## 2017-05-14 ENCOUNTER — Emergency Department (HOSPITAL_COMMUNITY)
Admission: EM | Admit: 2017-05-14 | Discharge: 2017-05-14 | Disposition: A | Discharge status: Home / Self Care | Payer: Self-pay | Attending: Emergency Medicine | Admitting: Emergency Medicine

## 2017-05-14 ENCOUNTER — Emergency Department (HOSPITAL_COMMUNITY): Payer: Self-pay

## 2017-05-14 ENCOUNTER — Encounter (HOSPITAL_COMMUNITY): Payer: Self-pay | Admitting: *Deleted

## 2017-05-14 DIAGNOSIS — G8929 Other chronic pain: Secondary | ICD-10-CM | POA: Insufficient documentation

## 2017-05-14 DIAGNOSIS — R079 Chest pain, unspecified: Secondary | ICD-10-CM | POA: Insufficient documentation

## 2017-05-14 DIAGNOSIS — L0211 Cutaneous abscess of neck: Secondary | ICD-10-CM | POA: Insufficient documentation

## 2017-05-14 DIAGNOSIS — L02811 Cutaneous abscess of head [any part, except face]: Secondary | ICD-10-CM

## 2017-05-14 DIAGNOSIS — R600 Localized edema: Secondary | ICD-10-CM | POA: Insufficient documentation

## 2017-05-14 DIAGNOSIS — I10 Essential (primary) hypertension: Secondary | ICD-10-CM | POA: Insufficient documentation

## 2017-05-14 DIAGNOSIS — Z794 Long term (current) use of insulin: Secondary | ICD-10-CM | POA: Insufficient documentation

## 2017-05-14 DIAGNOSIS — E119 Type 2 diabetes mellitus without complications: Secondary | ICD-10-CM | POA: Insufficient documentation

## 2017-05-14 DIAGNOSIS — Z7982 Long term (current) use of aspirin: Secondary | ICD-10-CM | POA: Insufficient documentation

## 2017-05-14 DIAGNOSIS — Z955 Presence of coronary angioplasty implant and graft: Secondary | ICD-10-CM | POA: Insufficient documentation

## 2017-05-14 DIAGNOSIS — I2510 Atherosclerotic heart disease of native coronary artery without angina pectoris: Secondary | ICD-10-CM | POA: Insufficient documentation

## 2017-05-14 DIAGNOSIS — Z79899 Other long term (current) drug therapy: Secondary | ICD-10-CM | POA: Insufficient documentation

## 2017-05-14 LAB — BASIC METABOLIC PANEL
Anion gap: 11 (ref 5–15)
BUN: 31 mg/dL — ABNORMAL HIGH (ref 6–20)
CALCIUM: 9.5 mg/dL (ref 8.9–10.3)
CO2: 23 mmol/L (ref 22–32)
CREATININE: 1.7 mg/dL — AB (ref 0.61–1.24)
Chloride: 106 mmol/L (ref 101–111)
GFR calc Af Amer: 55 mL/min — ABNORMAL LOW (ref >60)
GFR, EST NON AFRICAN AMERICAN: 47 mL/min — AB (ref >60)
Glucose, Bld: 220 mg/dL — ABNORMAL HIGH (ref 65–99)
Potassium: 4.2 mmol/L (ref 3.5–5.1)
Sodium: 140 mmol/L (ref 135–145)

## 2017-05-14 LAB — CBC
HCT: 38.3 % — ABNORMAL LOW (ref 39.0–52.0)
Hemoglobin: 12.4 g/dL — ABNORMAL LOW (ref 13.0–17.0)
MCH: 28.2 pg (ref 26.0–34.0)
MCHC: 32.4 g/dL (ref 30.0–36.0)
MCV: 87.2 fL (ref 78.0–100.0)
PLATELETS: 490 10*3/uL — AB (ref 150–400)
RBC: 4.39 MIL/uL (ref 4.22–5.81)
RDW: 16.1 % — ABNORMAL HIGH (ref 11.5–15.5)
WBC: 11 10*3/uL — ABNORMAL HIGH (ref 4.0–10.5)

## 2017-05-14 LAB — TROPONIN I: TROPONIN I: 0.03 ng/mL — AB (ref <0.03)

## 2017-05-14 LAB — I-STAT TROPONIN, ED: TROPONIN I, POC: 0.02 ng/mL (ref 0.00–0.08)

## 2017-05-14 MED ORDER — SULFAMETHOXAZOLE-TRIMETHOPRIM 800-160 MG PO TABS
1.0000 | ORAL_TABLET | Freq: Once | ORAL | Status: AC
  Administered 2017-05-14: 1 via ORAL
  Filled 2017-05-14: qty 1

## 2017-05-14 MED ORDER — OXYCODONE-ACETAMINOPHEN 5-325 MG PO TABS
2.0000 | ORAL_TABLET | Freq: Once | ORAL | Status: AC
  Administered 2017-05-14: 2 via ORAL
  Filled 2017-05-14: qty 2

## 2017-05-14 MED ORDER — OMEPRAZOLE 20 MG PO CPDR: 20.0000 mg | DELAYED_RELEASE_CAPSULE | Freq: Every day | ORAL | 0 refills | Status: DC

## 2017-05-14 MED ORDER — LIDOCAINE-EPINEPHRINE (PF) 2 %-1:200000 IJ SOLN
10.0000 mL | Freq: Once | INTRAMUSCULAR | Status: AC
  Administered 2017-05-14: 10 mL via INTRADERMAL
  Filled 2017-05-14: qty 20

## 2017-05-14 MED ORDER — SUCRALFATE 1 G PO TABS: 1.0000 g | ORAL_TABLET | Freq: Three times a day (TID) | ORAL | 0 refills | Status: DC

## 2017-05-14 MED ORDER — GI COCKTAIL ~~LOC~~
30.0000 mL | Freq: Once | ORAL | Status: AC
  Administered 2017-05-14: 30 mL via ORAL
  Filled 2017-05-14: qty 30

## 2017-05-14 MED ORDER — SULFAMETHOXAZOLE-TRIMETHOPRIM 800-160 MG PO TABS: 1.0000 | ORAL_TABLET | Freq: Two times a day (BID) | ORAL | 0 refills | Status: AC

## 2017-05-14 NOTE — ED Notes (Signed)
Patient transported to X-ray 

## 2017-05-14 NOTE — ED Notes (Addendum)
Pt stated "I usually get admitted and get morphine for the chest pain."

## 2017-05-14 NOTE — ED Notes (Signed)
Date and time results received: 05/14/17 0937 (use smartphrase ".now" to insert current time)  Test: Troponin  Critical Value: 0.03  Name of Provider Notified: Issacs  Orders Received? Or Actions Taken?: Actions Taken: Notified EDP and RN

## 2017-05-14 NOTE — Discharge Instructions (Signed)
For your neck: Follow-up in 48 hours with the ER or your primary doctor Take the antibiotics as prescribed. You've been given a dose here. Take tylenol as needed for pain.  For your chest: Your heart labs were very reassuring. I've prescribed antacids to help with some of the pain, as it could be related to reflux Follow-up with cardiology

## 2017-05-14 NOTE — ED Triage Notes (Signed)
Pt presents with abscess to left posterior neck.  Pt also c/o mid-sternal chest pain.

## 2017-05-14 NOTE — ED Notes (Signed)
Pt given a meal tray for breakfast. Pt asked RN to setup meal tray, assist him with the urinal, and situate him in the bed.

## 2017-05-14 NOTE — ED Notes (Signed)
RN went in to discharge pt, and pt was very anxious stating he had not yet spoken to the doctor again and wanting to know if he was going to be kept overnight.  MD aware of patients concerns and states he will speak to the pt again and then RN may d/c pt.

## 2017-05-14 NOTE — ED Triage Notes (Signed)
Per [facility], pt c/o spider bite to posterior neck and chronic chest pain.

## 2017-05-14 NOTE — ED Provider Notes (Signed)
[facility] DEPT Provider Note   CSN: [id number] Arrival date & time: 05/14/17  0445     History   Chief Complaint Chief Complaint  Patient presents with  . Abscess  . Chest Pain    HPI Aaron Pearson is a 47 y.o. male.  HPI   47 yo M with h/o CAD, DM, HTN, h/o recent MI and NSTEMI here with primary c/o abscess, also CP.  Abscess - Pt notes that over the past 3-4 days he's had progressively worsening swelling, pain to his left neck. He noticed it at the base of his neck 3 days ago. Pain is aching, throbbing. He's had an area of associated redness and swelling. H/o MRSA abscesses. No drainage. No fever or chills. No neck stiffness. Painful when palpated, no pain at rest/when not moving. No other alleviating factors. Has not been on ABX. Denies any known trauma or bites.  CP - This has been constant, chronic x weeks. He has undergone cath x 2 in last week, most recently 5/1 with diffuse dz treated medically. Aching, dull, substernal. States nitro gives him a headache, and morphine is the only thing that helps. No worsening of his pain. No SOB. No diaphoresis or lightheadedness. Sx are at rest, also with exertion.  Past Medical History:  Diagnosis Date  . Anxiety   . 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 Coronary artery disease   . Diabetes mellitus without complication ([place])   . GERD (gastroesophageal reflux disease)   . Headache   . Hx of diabetic neuropathy   . Hypertension   . Myocardial infarction ([place]) 2018  . Renal disorder     Patient Active Problem List   Diagnosis Date Noted  . Unstable angina ([place]) 05/05/2017  . Chest pain 05/04/2017    Past Surgical History:  Procedure Laterality Date  . CARDIAC CATHETERIZATION  05/05/2017  . CARPAL TUNNEL RELEASE    . CORONARY STENT INTERVENTION N/A 05/05/2017   Procedure: CORONARY STENT INTERVENTION;  Surgeon: Nigel Mormon, MD;  Location: [facility];  Service: Cardiovascular;   Laterality: N/A;  . CORONARY STENT PLACEMENT    . INTRAVASCULAR PRESSURE WIRE/FFR STUDY N/A 05/05/2017   Procedure: INTRAVASCULAR PRESSURE WIRE/FFR STUDY;  Surgeon: Nigel Mormon, MD;  Location: [facility];  Service: Cardiovascular;  Laterality: N/A;  . INTRAVASCULAR PRESSURE WIRE/FFR STUDY N/A 05/10/2017   Procedure: INTRAVASCULAR PRESSURE WIRE/FFR STUDY;  Surgeon: Nigel Mormon, MD;  Location: [facility];  Service: Cardiovascular;  Laterality: N/A;  . LEFT HEART CATH AND CORONARY ANGIOGRAPHY N/A 05/05/2017   Procedure: LEFT HEART CATH AND CORONARY ANGIOGRAPHY;  Surgeon: Nigel Mormon, MD;  Location: [facility];  Service: Cardiovascular;  Laterality: N/A;  . LEFT HEART CATH AND CORONARY ANGIOGRAPHY N/A 05/10/2017   Procedure: LEFT HEART CATH AND CORONARY ANGIOGRAPHY;  Surgeon: Nigel Mormon, MD;  Location: [facility];  Service: Cardiovascular;  Laterality: N/A;        Home Medications    Prior to Admission medications   Medication Sig Start Date End Date Taking? Authorizing Provider  acetaminophen (TYLENOL) 325 MG tablet Take 2 tablets (650 mg total) by mouth every 6 (six) hours as needed for moderate pain or headache. 05/06/17  Yes Patwardhan, Manish J, MD  amLODipine (NORVASC) 10 MG tablet Take 10 mg by mouth daily. 04/06/17  Yes [provider]  aspirin EC 81 MG tablet Take 81 mg by mouth daily.  04/06/17  Yes [provider]  atorvastatin (LIPITOR) 80 MG tablet Take 80 mg by mouth daily at 6 PM. 04/06/17 04/06/18 Yes [provider]  furosemide (LASIX) 40 MG tablet Take 0.5 tablets (20 mg total) by mouth 2 (two) times daily. 05/11/17 05/11/18 Yes Patwardhan, Manish J, MD  gabapentin (NEURONTIN) 300 MG capsule Take 600 mg by mouth 3 (three) times daily.  04/07/17  Yes [provider]  Insulin Glargine 300 UNIT/ML SOPN Inject 25 Units into the skin daily. 03/14/17  Yes [provider]  Insulin Glulisine  (APIDRA) 100 UNIT/ML Solostar Pen Inject 15 Units into the skin 3 (three) times daily before meals. 03/14/17 03/14/18 Yes [provider]  lisinopril (PRINIVIL,ZESTRIL) 40 MG tablet Take 1 tablet (40 mg total) by mouth every morning. 05/06/17  Yes Patwardhan, Manish J, MD  metoprolol tartrate (LOPRESSOR) 100 MG tablet Take 100 mg by mouth 2 (two) times daily. 04/06/17 05/14/17 Yes [provider]  mupirocin ointment (BACTROBAN) 2 % Place 1 application into the nose 2 (two) times daily. 05/05/17  Yes Patwardhan, Manish J, MD  nitroGLYCERIN (NITROSTAT) 0.4 MG SL tablet Place 1 tablet (0.4 mg total) under the tongue See admin instructions. Place 1 tablet (0.4mg ) under tongue every 5 minutes as needed for chest pain. Call doctor/ 911 if take 2 doses. Max 3/Day 05/06/17  Yes Patwardhan, Manish J, MD  potassium chloride (K-DUR,KLOR-CON) 20 MEQ tablet Take 1 tablet (20 mEq total) by mouth daily. 05/11/17  Yes Patwardhan, Reynold Bowen, MD  ticagrelor (BRILINTA) 90 MG TABS tablet Take 1 tablet (90 mg total) by mouth 2 (two) times daily. 05/05/17  Yes Patwardhan, Manish J, MD  omeprazole (PRILOSEC) 20 MG capsule Take 1 capsule (20 mg total) by mouth daily for 14 days. 05/14/17 05/28/17  Duffy Bruce, MD  sucralfate (CARAFATE) 1 g tablet Take 1 tablet (1 g total) by mouth 4 (four) times daily -  with meals and at bedtime for 10 days. 05/14/17 05/24/17  Duffy Bruce, MD  sulfamethoxazole-trimethoprim (BACTRIM DS,SEPTRA DS) 800-160 MG tablet Take 1 tablet by mouth 2 (two) times daily for 7 days. 05/14/17 05/21/17  Duffy Bruce, MD    Family History Family History  Problem Relation Age of Onset  . Coronary artery disease Mother   . Coronary artery disease Father     Social History Social History   Tobacco Use  . Smoking status: Never Smoker  . Smokeless tobacco: Never Used  Substance Use Topics  . Alcohol use: Never    Frequency: Never  . Drug use: Never     Allergies   Benadryl [diphenhydramine]  and Penicillins   Review of Systems Review of Systems  Constitutional: Positive for fatigue. Negative for chills and fever.  HENT: Negative for congestion and rhinorrhea.   Eyes: Negative for visual disturbance.  Respiratory: Positive for chest tightness. Negative for cough, shortness of breath and wheezing.   Cardiovascular: Positive for chest pain. Negative for leg swelling.  Gastrointestinal: Negative for abdominal pain, diarrhea, nausea and vomiting.  Genitourinary: Negative for dysuria and flank pain.  Musculoskeletal: Negative for neck pain and neck stiffness.  Skin: Positive for rash. Negative for wound.  Allergic/Immunologic: Negative for immunocompromised state.  Neurological: Negative for syncope, weakness and headaches.  All other systems reviewed and are negative.    Physical Exam Updated Vital Signs BP (!) 151/92 (BP Location: Left Arm)   Pulse 89   Temp 98.3 F (36.8 C) (Oral)   Resp 16   Ht 5'  9" (1.753 m)   Wt 94.3 kg (208 lb)   SpO2 99%   BMI 30.72 kg/m   Physical Exam  Constitutional: He is oriented to person, place, and time. He appears well-developed and well-nourished. No distress.  HENT:  Head: Normocephalic and atraumatic.  Eyes: Conjunctivae are normal.  Neck: Neck supple.    Cardiovascular: Normal rate, regular rhythm and normal heart sounds. Exam reveals no friction rub.  No murmur heard. Pulmonary/Chest: Effort normal and breath sounds normal. No respiratory distress. He has no wheezes. He has no rales.  Abdominal: He exhibits no distension.  Musculoskeletal: He exhibits no edema.  Trace b/l edema  Neurological: He is alert and oriented to person, place, and time. He exhibits normal muscle tone.  Skin: Skin is warm. Capillary refill takes less than 2 seconds.  Psychiatric: He has a normal mood and affect.  Nursing note and vitals reviewed.    ED Treatments / Results  Labs (all labs ordered are listed, but only abnormal results are  displayed) Labs Reviewed  BASIC METABOLIC PANEL - Abnormal; Notable for the following components:      Result Value   Glucose, Bld 220 (*)    BUN 31 (*)    Creatinine, Ser 1.70 (*)    GFR calc non Af Amer 47 (*)    GFR calc Af Amer 55 (*)    All other components within normal limits  CBC - Abnormal; Notable for the following components:   WBC 11.0 (*)    Hemoglobin 12.4 (*)    HCT 38.3 (*)    RDW 16.1 (*)    Platelets 490 (*)    All other components within normal limits  TROPONIN I - Abnormal; Notable for the following components:   Troponin I 0.03 (*)    All other components within normal limits  I-STAT TROPONIN, ED    EKG EKG Interpretation  Date/Time:  Sunday May 14 2017 06:06:09 EDT Ventricular Rate:  87 PR Interval:    QRS Duration: 128 QT Interval:  372 QTC Calculation: 448 R Axis:   -155 Text Interpretation:  Sinus rhythm Left atrial enlargement RBBB and LPFB Baseline wander in lead(s) III No significant change was found Confirmed by Shanon Rosser [phone]) on 05/14/2017 6:11:02 AM   Radiology Dg Chest 2 View  Result Date: 05/14/2017 CLINICAL DATA:  47 y/o M; central chest pain, dizziness, shortness of breath. EXAM: CHEST - 2 VIEW COMPARISON:  05/09/2017 chest radiograph FINDINGS: Stable heart size and mediastinal contours are within normal limits. Both lungs are clear. The visualized skeletal structures are unremarkable. IMPRESSION: No acute pulmonary process identified. Electronically Signed   By: Kristine Garbe M.D.   On: 05/14/2017 06:32    Procedures .Marland KitchenIncision and Drainage Date/Time: 05/14/2017 8:59 AM Performed by: Duffy Bruce, MD Authorized by: Duffy Bruce, MD   Consent:    Consent obtained:  Verbal   Consent given by:  Patient   Risks discussed:  Bleeding, damage to other organs, incomplete drainage, infection and pain   Alternatives discussed:  Alternative treatment and delayed treatment Location:    Type:  Abscess Pre-procedure  details:    Skin preparation:  Betadine Anesthesia (see MAR for exact dosages):    Anesthesia method:  Local infiltration   Local anesthetic:  Lidocaine 1% WITH epi Procedure type:    Complexity:  Simple Procedure details:    Incision types:  Single straight   Incision depth:  Dermal   Scalpel blade:  11   Wound management:  Probed and deloculated and irrigated with saline   Drainage:  Purulent   Drainage amount:  Moderate   Wound treatment:  Wound left open   Packing materials:  1/4 in iodoform gauze Post-procedure details:    Patient tolerance of procedure:  Tolerated well, no immediate complications   (including critical care time)  Medications Ordered in ED Medications  gi cocktail (Maalox,Lidocaine,Donnatal) (has no administration in time range)  lidocaine-EPINEPHrine (XYLOCAINE W/EPI) 2 %-1:200000 (PF) injection 10 mL (10 mLs Intradermal Given by Other 05/14/17 0837)  oxyCODONE-acetaminophen (PERCOCET/ROXICET) 5-325 MG per tablet 2 tablet (2 tablets Oral Given 05/14/17 0836)  sulfamethoxazole-trimethoprim (BACTRIM DS,SEPTRA DS) 800-160 MG per tablet 1 tablet (1 tablet Oral Given 05/14/17 0836)     Initial Impression / Assessment and Plan / ED Course  I have reviewed the triage vital signs and the nursing notes.  Pertinent labs & imaging results that were available during my care of the patient were reviewed by me and considered in my medical decision making (see chart for details).  Clinical Course as of May 14 1008  Sun May 14, 2017  0940 Abscess drained, trop 0.03 but this is downtrending. Will d/w Cardiology.   [CI]    Clinical Course User Index [CI] Duffy Bruce, MD    47 yo M here with abscess and CP.  Regarding his abscess - I&D performed. WIll tx for MRSA given history. No fever, tachycardia, or s/s systemic illness. He is not immune suppressed. Will place on Bactrim, d/c.  Regarding his CP - h/o multiple recent caths. Trop [employer] and formal) neg. Will  discuss with Cardiology given recent complex history. Concern for possible opiate-seeking behavior given repeated requests for moprhine, but must also consider underlying severe CAD.  Trop 0.03, down from prior. Discussed with Dr. Einar Gip. Given pt's recent cath with medical disease not amenable to PCI, constant pain and downtrending trop, he recommends d/c. Does not feel repeat trop needed. I do suspect there may be a component of seeking opiates. Will trial PPIs for possible GERD component, d/c.  Final Clinical Impressions(s) / ED Diagnoses   Final diagnoses:  Scalp abscess  Chronic chest pain    ED Discharge Orders        Ordered    sulfamethoxazole-trimethoprim (BACTRIM DS,SEPTRA DS) 800-160 MG tablet  2 times daily     05/14/17 1007    omeprazole (PRILOSEC) 20 MG capsule  Daily     05/14/17 1007    sucralfate (CARAFATE) 1 g tablet  3 times daily with meals & bedtime     05/14/17 1007       Duffy Bruce, MD 05/14/17 1010

## 2017-05-15 ENCOUNTER — Encounter: Payer: Self-pay | Admitting: Pediatric Intensive Care

## 2017-05-15 ENCOUNTER — Telehealth: Payer: Self-pay

## 2017-05-15 ENCOUNTER — Other Ambulatory Visit: Payer: Self-pay

## 2017-05-15 ENCOUNTER — Encounter (HOSPITAL_COMMUNITY): Payer: Self-pay

## 2017-05-15 ENCOUNTER — Emergency Department (HOSPITAL_COMMUNITY)
Admission: EM | Admit: 2017-05-15 | Discharge: 2017-05-16 | Disposition: A | Discharge status: Home / Self Care | Payer: Self-pay | Attending: Emergency Medicine | Admitting: Emergency Medicine

## 2017-05-15 DIAGNOSIS — R531 Weakness: Secondary | ICD-10-CM | POA: Insufficient documentation

## 2017-05-15 DIAGNOSIS — Z5321 Procedure and treatment not carried out due to patient leaving prior to being seen by health care provider: Secondary | ICD-10-CM | POA: Insufficient documentation

## 2017-05-15 DIAGNOSIS — R079 Chest pain, unspecified: Secondary | ICD-10-CM | POA: Insufficient documentation

## 2017-05-15 LAB — BASIC METABOLIC PANEL
Anion gap: 12 (ref 5–15)
BUN: 29 mg/dL — ABNORMAL HIGH (ref 6–20)
CALCIUM: 9.4 mg/dL (ref 8.9–10.3)
CO2: 22 mmol/L (ref 22–32)
CREATININE: 1.98 mg/dL — AB (ref 0.61–1.24)
Chloride: 98 mmol/L — ABNORMAL LOW (ref 101–111)
GFR, EST AFRICAN AMERICAN: 46 mL/min — AB (ref >60)
GFR, EST NON AFRICAN AMERICAN: 39 mL/min — AB (ref >60)
GLUCOSE: 332 mg/dL — AB (ref 65–99)
Potassium: 4.7 mmol/L (ref 3.5–5.1)
Sodium: 132 mmol/L — ABNORMAL LOW (ref 135–145)

## 2017-05-15 LAB — CBC
HCT: 36.3 % — ABNORMAL LOW (ref 39.0–52.0)
Hemoglobin: 11.7 g/dL — ABNORMAL LOW (ref 13.0–17.0)
MCH: 27.8 pg (ref 26.0–34.0)
MCHC: 32.2 g/dL (ref 30.0–36.0)
MCV: 86.2 fL (ref 78.0–100.0)
PLATELETS: 464 10*3/uL — AB (ref 150–400)
RBC: 4.21 MIL/uL — ABNORMAL LOW (ref 4.22–5.81)
RDW: 16.3 % — ABNORMAL HIGH (ref 11.5–15.5)
WBC: 8 10*3/uL (ref 4.0–10.5)

## 2017-05-15 LAB — I-STAT TROPONIN, ED: TROPONIN I, POC: 0.02 ng/mL (ref 0.00–0.08)

## 2017-05-15 MED FILL — OMEPRAZOLE 20 MG CAP: 20 | 14 days supply | Qty: 14 | Fill #0

## 2017-05-15 MED FILL — SUCRALFATE 1 GM TABLET: 1 | 10 days supply | Qty: 40 | Fill #0

## 2017-05-15 MED FILL — SULFAMETHOXAZOLE-TMP DS TAB: 800-160 | 7 days supply | Qty: 14 | Fill #0

## 2017-05-15 NOTE — ED Notes (Signed)
Pt called for X-ray X 2 no answer.

## 2017-05-15 NOTE — ED Triage Notes (Signed)
Pt comes via [facility] for generalized weakness that started weakness that started yesterday and CP that has been going on for months, hx of MI, vomited x 2 today. Denies other cardiac symptoms. Had abscess that was lanced Saturday.

## 2017-05-15 NOTE — Telephone Encounter (Signed)
[facility] Post-discharge Follow-Up Phone Call:  Date of Discharge:  05/11/2017 Principal Discharge Diagnosis(es):  Chest pain, NSTEMI, CAD, DM Post-discharge Communication: (Clearly document all attempts clearly and date contact made) call received from [place], [place] who was with the patient.  Call Completed: Yes                    With Whom: Lisette Abu, RN Interpreter Needed: no    Please check all that apply:  X  Patient is knowledgeable of his/her condition(s) and/or treatment. ? Patient is caring for self at home.  X  Patient is receiving assist at home from family and/or caregiver. Family and/or caregiver is knowledgeable of patient's condition(s) and/or treatment. - patient is currently homeless and is staying at the [facility]. He had been at [facility] for the 90 days prior to his hospital admission.  He was receiving medical care in [facility] but is not able to get to [facility] at this time. Aaron Pearson will provide him with bus passes to get to [facility] and she stated that he will be accompanied by hs " buddy" Will. Jordan Hawks will be able to check on him when she is at the clinic at [facility].  ? Patient is receiving home health services. If so, name of agency.     Medication Reconciliation:  ? Medication list reviewed with patient. X  Patient obtained all discharge medications. If not, why? - as per Aaron Pearson, he has all of his medications and has been managing them independently. She stated that his vision is poor and he has been using an insulin pen as he is not able to see the insulin syringe to draw up the insulin. Aaron Pearson also noted that she provided him with a pill sorter.  She also reported that he has a glucometer.  He had been receiving his medications from the hospital pharmacy in [facility],  This CM spoke to [employer], [place] regarding the patient's medication regime. He will plan to meet with the patient tomorrow when  he is at the clinic for his appointment.    Activities of Daily Living:  X  Independent - as per Aaron Pearson, he is independent with ADLs and ambulates without an assistive device.  ? Needs assist (describe; ? home DME used) ? [employer] (describe, ? home DME used)   Community resources in place for patient:  X  [employer]   ? Home Health/Home DME ? Assisted Living ? Support Group          He will need to meet with the [facility] Financial Counselor to apply for an [employer] and [employer] assistance as he has no insurance.  He will also need to apply for a Blue Card if he is going to get his medications from [facility],  As per Aaron Pearson, he may also need to meet with [employer] [place] regarding his disability.   He was seen in the ED on 05/14/17 for neck abscess and I&D as well as chest pain.  He will need. As per Aaron Pearson and AVS, patient will need to have the dressing removed.  An appointment was scheduled for him at [facility] for tomorrow  - 05/16/17 @ 1010.  Maisie Fus [facility] triage aware of the need for wound care at his appointment tomorrow. She confirmed [place] has iodoform for would care if needed.        Aaron Pearson stated that he has been instructed  to bring all of his medications, including his insulin, to the appointment tomorrow

## 2017-05-16 ENCOUNTER — Telehealth: Payer: Self-pay

## 2017-05-16 ENCOUNTER — Encounter: Payer: Self-pay | Admitting: Family Medicine

## 2017-05-16 ENCOUNTER — Ambulatory Visit: Payer: Self-pay | Attending: Family Medicine | Admitting: Family Medicine

## 2017-05-16 ENCOUNTER — Ambulatory Visit: Payer: Self-pay | Attending: Family Medicine | Admitting: Licensed Clinical Social Worker

## 2017-05-16 VITALS — BP 128/85 | HR 88 | Temp 98.8°F | Wt 204.4 lb

## 2017-05-16 DIAGNOSIS — I10 Essential (primary) hypertension: Secondary | ICD-10-CM | POA: Insufficient documentation

## 2017-05-16 DIAGNOSIS — Z794 Long term (current) use of insulin: Secondary | ICD-10-CM | POA: Insufficient documentation

## 2017-05-16 DIAGNOSIS — F329 Major depressive disorder, single episode, unspecified: Secondary | ICD-10-CM | POA: Insufficient documentation

## 2017-05-16 DIAGNOSIS — E119 Type 2 diabetes mellitus without complications: Secondary | ICD-10-CM | POA: Insufficient documentation

## 2017-05-16 DIAGNOSIS — G8929 Other chronic pain: Secondary | ICD-10-CM | POA: Insufficient documentation

## 2017-05-16 DIAGNOSIS — L02811 Cutaneous abscess of head [any part, except face]: Secondary | ICD-10-CM | POA: Insufficient documentation

## 2017-05-16 DIAGNOSIS — F419 Anxiety disorder, unspecified: Secondary | ICD-10-CM

## 2017-05-16 DIAGNOSIS — E1169 Type 2 diabetes mellitus with other specified complication: Secondary | ICD-10-CM | POA: Insufficient documentation

## 2017-05-16 DIAGNOSIS — Z955 Presence of coronary angioplasty implant and graft: Secondary | ICD-10-CM | POA: Insufficient documentation

## 2017-05-16 DIAGNOSIS — Z79899 Other long term (current) drug therapy: Secondary | ICD-10-CM | POA: Insufficient documentation

## 2017-05-16 DIAGNOSIS — F32A Depression, unspecified: Secondary | ICD-10-CM | POA: Insufficient documentation

## 2017-05-16 DIAGNOSIS — Z880 Allergy status to penicillin: Secondary | ICD-10-CM | POA: Insufficient documentation

## 2017-05-16 DIAGNOSIS — Z9889 Other specified postprocedural states: Secondary | ICD-10-CM | POA: Insufficient documentation

## 2017-05-16 DIAGNOSIS — I252 Old myocardial infarction: Secondary | ICD-10-CM | POA: Insufficient documentation

## 2017-05-16 DIAGNOSIS — I25110 Atherosclerotic heart disease of native coronary artery with unstable angina pectoris: Secondary | ICD-10-CM | POA: Insufficient documentation

## 2017-05-16 DIAGNOSIS — Z888 Allergy status to other drugs, medicaments and biological substances status: Secondary | ICD-10-CM | POA: Insufficient documentation

## 2017-05-16 DIAGNOSIS — M542 Cervicalgia: Secondary | ICD-10-CM | POA: Insufficient documentation

## 2017-05-16 DIAGNOSIS — I2510 Atherosclerotic heart disease of native coronary artery without angina pectoris: Secondary | ICD-10-CM | POA: Insufficient documentation

## 2017-05-16 DIAGNOSIS — K219 Gastro-esophageal reflux disease without esophagitis: Secondary | ICD-10-CM | POA: Insufficient documentation

## 2017-05-16 DIAGNOSIS — Z7982 Long term (current) use of aspirin: Secondary | ICD-10-CM | POA: Insufficient documentation

## 2017-05-16 LAB — POCT GLYCOSYLATED HEMOGLOBIN (HGB A1C): HEMOGLOBIN A1C: 10.7

## 2017-05-16 LAB — GLUCOSE, POCT (MANUAL RESULT ENTRY): POC Glucose: 233 mg/dl — AB (ref 70–99)

## 2017-05-16 MED ORDER — INSULIN ASPART 100 UNIT/ML FLEXPEN: 10.0000 [IU] | PEN_INJECTOR | Freq: Three times a day (TID) | SUBCUTANEOUS | 3 refills | Status: DC

## 2017-05-16 MED ORDER — POTASSIUM CHLORIDE CRYS ER 20 MEQ PO TBCR: 20.0000 meq | EXTENDED_RELEASE_TABLET | Freq: Every day | ORAL | 3 refills | Status: DC

## 2017-05-16 MED ORDER — TRUEPLUS LANCETS 28G MISC: 1.0000 | Freq: Three times a day (TID) | 12 refills | Status: DC

## 2017-05-16 MED ORDER — DICLOFENAC SODIUM 1 % TD GEL: 4.0000 g | Freq: Four times a day (QID) | TRANSDERMAL | 1 refills | Status: DC

## 2017-05-16 MED ORDER — INSULIN GLARGINE 100 UNIT/ML SOLOSTAR PEN: 35.0000 [IU] | PEN_INJECTOR | Freq: Every day | SUBCUTANEOUS | 3 refills | Status: DC

## 2017-05-16 MED ORDER — METOPROLOL TARTRATE 100 MG PO TABS: 100.0000 mg | ORAL_TABLET | Freq: Two times a day (BID) | ORAL | 3 refills | Status: DC

## 2017-05-16 MED ORDER — FUROSEMIDE 40 MG PO TABS: 20.0000 mg | ORAL_TABLET | Freq: Two times a day (BID) | ORAL | 3 refills | Status: DC

## 2017-05-16 MED ORDER — ATORVASTATIN CALCIUM 80 MG PO TABS: 80.0000 mg | ORAL_TABLET | Freq: Every day | ORAL | 3 refills | Status: DC

## 2017-05-16 MED ORDER — GABAPENTIN 300 MG PO CAPS: 600.0000 mg | ORAL_CAPSULE | Freq: Three times a day (TID) | ORAL | 3 refills | Status: DC

## 2017-05-16 MED ORDER — LISINOPRIL 40 MG PO TABS: 40.0000 mg | ORAL_TABLET | Freq: Every morning | ORAL | 3 refills | Status: DC

## 2017-05-16 MED ORDER — TRUE METRIX METER DEVI: 1.0000 | Freq: Three times a day (TID) | 0 refills | Status: DC

## 2017-05-16 MED ORDER — DULOXETINE HCL 60 MG PO CPEP: 60.0000 mg | ORAL_CAPSULE | Freq: Every day | ORAL | 3 refills | Status: DC

## 2017-05-16 MED ORDER — TICAGRELOR 90 MG PO TABS: 90.0000 mg | ORAL_TABLET | Freq: Two times a day (BID) | ORAL | 3 refills | Status: DC

## 2017-05-16 MED ORDER — LIDOCAINE 5 % EX PTCH
1.0000 | MEDICATED_PATCH | CUTANEOUS | 1 refills | Status: DC
Start: 2017-05-16 — End: 2017-07-19

## 2017-05-16 MED ORDER — OMEPRAZOLE 20 MG PO CPDR: 20.0000 mg | DELAYED_RELEASE_CAPSULE | Freq: Every day | ORAL | 3 refills | Status: DC

## 2017-05-16 MED ORDER — AMLODIPINE BESYLATE 10 MG PO TABS: 10.0000 mg | ORAL_TABLET | Freq: Every day | ORAL | 3 refills | Status: DC

## 2017-05-16 MED ORDER — GLUCOSE BLOOD VI STRP: ORAL_STRIP | 12 refills | Status: DC

## 2017-05-16 MED FILL — POTASSIUM CL ER 20 MEQ TAB: 20 | 30 days supply | Qty: 30 | Fill #0

## 2017-05-16 MED FILL — LISINOPRIL 40 MG TABLET: 40 | 30 days supply | Qty: 30 | Fill #0

## 2017-05-16 MED FILL — !TRUE METRIX BLOOD GLUCOSE: 1 days supply | Qty: 1 | Fill #0

## 2017-05-16 MED FILL — OMEPRAZOLE 20 MG CAP: 20 | 30 days supply | Qty: 30 | Fill #0

## 2017-05-16 MED FILL — DULoxetine HCL 60 MG CPEP: 60 | 30 days supply | Qty: 30 | Fill #0

## 2017-05-16 MED FILL — !LANTUS SOLOSTAR 100UNITS/M: 100 | 34 days supply | Qty: 12 | Fill #0

## 2017-05-16 MED FILL — BRILINTA 90 MG TABLET: 90 | 30 days supply | Qty: 60 | Fill #0

## 2017-05-16 MED FILL — NOVOLOG FLEXPEN SYRINGE: 100 | 30 days supply | Qty: 9 | Fill #0

## 2017-05-16 MED FILL — DICLOFENAC SODIUM 1% GEL: 1 | 6 days supply | Qty: 100 | Fill #0

## 2017-05-16 MED FILL — TRUE METRIX TEST STRIP: 30 days supply | Qty: 100 | Fill #0

## 2017-05-16 MED FILL — ATORVASTATIN 80 MG TABLET: 80 | 30 days supply | Qty: 30 | Fill #0

## 2017-05-16 MED FILL — ?LIDOCAINE 5% PATCH: 5 | 30 days supply | Qty: 30 | Fill #0

## 2017-05-16 MED FILL — FUROSEMIDE 40 MG TAB: 40 | 30 days supply | Qty: 30 | Fill #0

## 2017-05-16 MED FILL — METOPROLOL TARTRATE 100 MG: 100 | 30 days supply | Qty: 60 | Fill #0

## 2017-05-16 MED FILL — GABAPENTIN 300 MG CAPSULE: 300 | 30 days supply | Qty: 180 | Fill #0

## 2017-05-16 MED FILL — TRUEplus LANCETS 28G MISC: 30 days supply | Qty: 100 | Fill #0

## 2017-05-16 MED FILL — AMLODIPINE BESYLATE 10 MG T: 10 | 30 days supply | Qty: 30 | Fill #0

## 2017-05-16 NOTE — Progress Notes (Signed)
[id number] A1C-10.7

## 2017-05-16 NOTE — ED Notes (Signed)
Called to recheck Pt's vitals. No answer x3.

## 2017-05-16 NOTE — Progress Notes (Signed)
Subjective:  Patient ID: Aaron Pearson, male    DOB: 30-May-1970  Age: 47 y.o. MRN: [id number]  CC: Diabetes and Establish Care   HPI Aaron Pearson is a 47 year old male with a history of type 2 diabetes mellitus (A1c 10.7), hypertension, coronary artery disease (status post multiple prior PCI) hospitalized at [facility] from 05/09/2017 through 05/11/2017 for acute coronary syndrome. He had undergone PCI for unstable angina to his left circumflex and LAD on 05/05/17.  Found to have elevated troponins at presentation and seen by cardiology with cardiac cath revealing: Conclusion   LM: Normal LAD: Prior prox-distal LAD stents with 20% prox LAD restenosis. Diagonal with moderate disease LCx: Patent OM2 stent with % restenosis        40% restensosis in small distal LCx OM2 90% stenosis. Successful PCI w/Resolute DES 2.75 X 18 mm RCA: Patent Distal RCA stent with 0% restenosis         RPL with FFR negative (0.91) moderate disease         Small RPDA with severe diffuse disease, best treated medically   Elevated LVEDP 30 mmHg  No stent thrombosis seen No severe restenosis seen     Recommendation was for dual antiplatelet therapy with aspirin and Brilinta for 1 year, aggressive risk factor management, diuresis.  Yesterday he had an incision and drainage of a left posterior scalp abscess which was packed with iodoform gauze. He presents today denying chest pain or shortness of breath but states "I need something for my nerves".  He informs me he has a feeling "as if something will happen" and his anxiety stems from his multiple medical conditions.  He denies suicidal ideations or intents and endorses compliance with his medications. He also informs me of chronic pain in his neck, his knees and his back.  Past Medical History:  Diagnosis Date  . Anxiety   . 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 Coronary artery disease   . Diabetes mellitus without complication ([place])   . GERD  (gastroesophageal reflux disease)   . Headache   . Hx of diabetic neuropathy   . Hypertension   . Myocardial infarction ([place]) 2018  . Renal disorder     Past Surgical History:  Procedure Laterality Date  . CARDIAC CATHETERIZATION  05/05/2017  . CARPAL TUNNEL RELEASE    . CORONARY STENT INTERVENTION N/A 05/05/2017   Procedure: CORONARY STENT INTERVENTION;  Surgeon: Nigel Mormon, MD;  Location: [place] [facility];  Service: Cardiovascular;  Laterality: N/A;  . CORONARY STENT PLACEMENT    . INTRAVASCULAR PRESSURE WIRE/FFR STUDY N/A 05/05/2017   Procedure: INTRAVASCULAR PRESSURE WIRE/FFR STUDY;  Surgeon: Nigel Mormon, MD;  Location: [place] [facility];  Service: Cardiovascular;  Laterality: N/A;  . INTRAVASCULAR PRESSURE WIRE/FFR STUDY N/A 05/10/2017   Procedure: INTRAVASCULAR PRESSURE WIRE/FFR STUDY;  Surgeon: Nigel Mormon, MD;  Location: [place] [facility];  Service: Cardiovascular;  Laterality: N/A;  . LEFT HEART CATH AND CORONARY ANGIOGRAPHY N/A 05/05/2017   Procedure: LEFT HEART CATH AND CORONARY ANGIOGRAPHY;  Surgeon: Nigel Mormon, MD;  Location: [place] [facility];  Service: Cardiovascular;  Laterality: N/A;  . LEFT HEART CATH AND CORONARY ANGIOGRAPHY N/A 05/10/2017   Procedure: LEFT HEART CATH AND CORONARY ANGIOGRAPHY;  Surgeon: Nigel Mormon, MD;  Location: [place] [facility];  Service: Cardiovascular;  Laterality: N/A;    Allergies  Allergen Reactions  . Benadryl [Diphenhydramine] Nausea Only  . Penicillins  Childhood allergy  Has patient had a PCN reaction causing immediate rash, facial/tongue/throat swelling, SOB or lightheadedness with hypotension: Unknown Has patient had a PCN reaction causing severe rash involving mucus membranes or skin necrosis: Unknown Has patient had a PCN reaction that required hospitalization: Unknown Has patient had a PCN reaction occurring within the last 10 years: No If all of the above answers are "NO", then  may proceed with Cephalosporin use.      Outpatient Medications Prior to Visit  Medication Sig Dispense Refill  . acetaminophen (TYLENOL) 325 MG tablet Take 2 tablets (650 mg total) by mouth every 6 (six) hours as needed for moderate pain or headache. 90 tablet 3  . aspirin EC 81 MG tablet Take 81 mg by mouth daily.    . mupirocin ointment (BACTROBAN) 2 % Place 1 application into the nose 2 (two) times daily. 22 g 0  . nitroGLYCERIN (NITROSTAT) 0.4 MG SL tablet Place 1 tablet (0.4 mg total) under the tongue See admin instructions. Place 1 tablet (0.4mg ) under tongue every 5 minutes as needed for chest pain. Call doctor/ 911 if take 2 doses. Max 3/Day 30 tablet 3  . sucralfate (CARAFATE) 1 g tablet Take 1 tablet (1 g total) by mouth 4 (four) times daily -  with meals and at bedtime for 10 days. 40 tablet 0  . sulfamethoxazole-trimethoprim (BACTRIM DS,SEPTRA DS) 800-160 MG tablet Take 1 tablet by mouth 2 (two) times daily for 7 days. 14 tablet 0  . amLODipine (NORVASC) 10 MG tablet Take 10 mg by mouth daily.    Marland Kitchen atorvastatin (LIPITOR) 80 MG tablet Take 80 mg by mouth daily at 6 PM.    . furosemide (LASIX) 40 MG tablet Take 0.5 tablets (20 mg total) by mouth 2 (two) times daily. 60 tablet 3  . gabapentin (NEURONTIN) 300 MG capsule Take 600 mg by mouth 3 (three) times daily.     . Insulin Glargine 300 UNIT/ML SOPN Inject 25 Units into the skin daily.    . Insulin Glulisine (APIDRA) 100 UNIT/ML Solostar Pen Inject 15 Units into the skin 3 (three) times daily before meals.    Marland Kitchen lisinopril (PRINIVIL,ZESTRIL) 40 MG tablet Take 1 tablet (40 mg total) by mouth every morning. 60 tablet 3  . omeprazole (PRILOSEC) 20 MG capsule Take 1 capsule (20 mg total) by mouth daily for 14 days. 14 capsule 0  . potassium chloride (K-DUR,KLOR-CON) 20 MEQ tablet Take 1 tablet (20 mEq total) by mouth daily. 60 tablet 3  . ticagrelor (BRILINTA) 90 MG TABS tablet Take 1 tablet (90 mg total) by mouth 2 (two) times daily.  180 tablet 3  . metoprolol tartrate (LOPRESSOR) 100 MG tablet Take 100 mg by mouth 2 (two) times daily.     No facility-administered medications prior to visit.     ROS Review of Systems  Constitutional: Negative for activity change and appetite change.  HENT: Negative for sinus pressure and sore throat.   Eyes: Negative for visual disturbance.  Respiratory: Negative for cough, chest tightness and shortness of breath.   Cardiovascular: Negative for chest pain and leg swelling.  Gastrointestinal: Negative for abdominal distention, abdominal pain, constipation and diarrhea.  Endocrine: Negative.   Genitourinary: Negative for dysuria.  Musculoskeletal:       See hpi  Skin: Negative for rash.  Allergic/Immunologic: Negative.   Neurological: Negative for weakness, light-headedness and numbness.  Psychiatric/Behavioral: Negative for dysphoric mood and suicidal ideas.       Positive for anxiety  Objective:  BP 128/85   Pulse 88   Temp 98.8 F (37.1 C) (Oral)   Wt 204 lb 6.4 oz (92.7 kg)   BMI 31.08 kg/m   BP/Weight 05/16/2017 06/15/4401 04/16/4257  Systolic BP 563 875 643  Diastolic BP 85 87 96  Wt. (Lbs) 204.4 208 208  BMI 31.08 31.63 30.72      Physical Exam  Constitutional: He is oriented to person, place, and time. He appears well-developed and well-nourished.  Neck: Normal range of motion. No JVD present.  Cardiovascular: Normal rate, normal heart sounds and intact distal pulses.  No murmur heard. Pulmonary/Chest: Effort normal and breath sounds normal. He has no wheezes. He has no rales. He exhibits no tenderness.  Abdominal: Soft. Bowel sounds are normal. He exhibits no distension and no mass. There is no tenderness.  Musculoskeletal: Normal range of motion. He exhibits no edema or tenderness.  Neurological: He is alert and oriented to person, place, and time.  Skin:  Left posterior scalp abscess - no dranage  Psychiatric: He has a normal mood and affect.     Lab  Results  Component Value Date   HGBA1C 10.7 05/16/2017    Assessment & Plan:   1. Type 2 diabetes mellitus with other specified complication, with long-term current use of insulin (HCC) Uncontrolled with A1c of 10.7 Poor control likely due to lack of compliance Refilled medications Continue diabetic diet and I will review blood sugar log at next visit - POCT glucose (manual entry) - POCT glycosylated hemoglobin (Hb A1C) - Insulin Glargine (LANTUS SOLOSTAR) 100 UNIT/ML Solostar Pen; Inject 35 Units into the skin daily at 10 pm.  Dispense: 5 pen; Refill: 3 - insulin aspart (NOVOLOG FLEXPEN) 100 UNIT/ML FlexPen; Inject 10 Units into the skin 3 (three) times daily with meals.  Dispense: 30 mL; Refill: 3 - glucose blood (TRUE METRIX BLOOD GLUCOSE TEST) test strip; 3 times daily before meals  Dispense: 100 each; Refill: 12 - Blood Glucose Monitoring Suppl (TRUE METRIX METER) DEVI; 1 each by Does not apply route 3 (three) times daily before meals.  Dispense: 1 Device; Refill: 0 - TRUEPLUS LANCETS 28G MISC; 1 each by Does not apply route 3 (three) times daily before meals.  Dispense: 100 each; Refill: 12  2. Essential hypertension Controlled Low-sodium diet - metoprolol tartrate (LOPRESSOR) 100 MG tablet; Take 1 tablet (100 mg total) by mouth 2 (two) times daily.  Dispense: 60 tablet; Refill: 3 - lisinopril (PRINIVIL,ZESTRIL) 40 MG tablet; Take 1 tablet (40 mg total) by mouth every morning.  Dispense: 30 tablet; Refill: 3 - amLODipine (NORVASC) 10 MG tablet; Take 1 tablet (10 mg total) by mouth daily.  Dispense: 30 tablet; Refill: 3  3. Coronary artery disease involving native coronary artery of native heart with unstable angina pectoris (HCC) No angina at this time Risk factor modification Follow-up with cardiology - ticagrelor (BRILINTA) 90 MG TABS tablet; Take 1 tablet (90 mg total) by mouth 2 (two) times daily.  Dispense: 180 tablet; Refill: 3 - atorvastatin (LIPITOR) 80 MG tablet; Take 1  tablet (80 mg total) by mouth daily at 6 PM.  Dispense: 30 tablet; Refill: 3  4. Anxiety and depression Comment Cymbalta which would also help with pain - DULoxetine (CYMBALTA) 60 MG capsule; Take 1 capsule (60 mg total) by mouth daily.  Dispense: 30 capsule; Refill: 3  5. Other chronic pain He has chronic pain of his neck, his knee and his back Unable to elicit tenderness on my exam, the  musculoskeletal system Will also add Voltaren gel; caution with oral NSAIDs due to the fact that he is on Brilinta - DULoxetine (CYMBALTA) 60 MG capsule; Take 1 capsule (60 mg total) by mouth daily.  Dispense: 30 capsule; Refill: 3 - lidocaine (LIDODERM) 5 %; Place 1 patch onto the skin daily. Remove & Discard patch within 12 hours or as directed by MD  Dispense: 30 patch; Refill: 1  6. Gastroesophageal reflux disease without esophagitis Stable - omeprazole (PRILOSEC) 20 MG capsule; Take 1 capsule (20 mg total) by mouth daily for 14 days.  Dispense: 30 capsule; Refill: 3  7. Scalp abscess Dressing change performed in the clinic   Case management and LCSW called in to assist with care coordination, assist with application for the [place] financial discount, community resources and we have ensure that he will be able to obtain his medications from the pharmacy today.  Meds ordered this encounter  Medications  . DULoxetine (CYMBALTA) 60 MG capsule    Sig: Take 1 capsule (60 mg total) by mouth daily.    Dispense:  30 capsule    Refill:  3  . lidocaine (LIDODERM) 5 %    Sig: Place 1 patch onto the skin daily. Remove & Discard patch within 12 hours or as directed by MD    Dispense:  30 patch    Refill:  1  . Insulin Glargine (LANTUS SOLOSTAR) 100 UNIT/ML Solostar Pen    Sig: Inject 35 Units into the skin daily at 10 pm.    Dispense:  5 pen    Refill:  3  . ticagrelor (BRILINTA) 90 MG TABS tablet    Sig: Take 1 tablet (90 mg total) by mouth 2 (two) times daily.    Dispense:  180 tablet     Refill:  3  . potassium chloride SA (K-DUR,KLOR-CON) 20 MEQ tablet    Sig: Take 1 tablet (20 mEq total) by mouth daily.    Dispense:  30 tablet    Refill:  3  . omeprazole (PRILOSEC) 20 MG capsule    Sig: Take 1 capsule (20 mg total) by mouth daily for 14 days.    Dispense:  30 capsule    Refill:  3  . metoprolol tartrate (LOPRESSOR) 100 MG tablet    Sig: Take 1 tablet (100 mg total) by mouth 2 (two) times daily.    Dispense:  60 tablet    Refill:  3  . lisinopril (PRINIVIL,ZESTRIL) 40 MG tablet    Sig: Take 1 tablet (40 mg total) by mouth every morning.    Dispense:  30 tablet    Refill:  3  . gabapentin (NEURONTIN) 300 MG capsule    Sig: Take 2 capsules (600 mg total) by mouth 3 (three) times daily.    Dispense:  60 capsule    Refill:  3  . amLODipine (NORVASC) 10 MG tablet    Sig: Take 1 tablet (10 mg total) by mouth daily.    Dispense:  30 tablet    Refill:  3  . atorvastatin (LIPITOR) 80 MG tablet    Sig: Take 1 tablet (80 mg total) by mouth daily at 6 PM.    Dispense:  30 tablet    Refill:  3  . furosemide (LASIX) 40 MG tablet    Sig: Take 0.5 tablets (20 mg total) by mouth 2 (two) times daily.    Dispense:  60 tablet    Refill:  3  . insulin aspart (NOVOLOG FLEXPEN)  100 UNIT/ML FlexPen    Sig: Inject 10 Units into the skin 3 (three) times daily with meals.    Dispense:  30 mL    Refill:  3  . glucose blood (TRUE METRIX BLOOD GLUCOSE TEST) test strip    Sig: 3 times daily before meals    Dispense:  100 each    Refill:  12  . Blood Glucose Monitoring Suppl (TRUE METRIX METER) DEVI    Sig: 1 each by Does not apply route 3 (three) times daily before meals.    Dispense:  1 Device    Refill:  0  . TRUEPLUS LANCETS 28G MISC    Sig: 1 each by Does not apply route 3 (three) times daily before meals.    Dispense:  100 each    Refill:  12    Follow-up: Return in about 3 weeks (around 06/06/2017) for Follow-up of chronic medical conditions.   Charlott Rakes MD

## 2017-05-16 NOTE — Telephone Encounter (Signed)
Met with the patient when he was in the clinic today to see Dr Margarita Rana. He was accompanied by a friend, Will,  from the shelter. Explained the services provided at [facility].  He met with Christa See, LCSW and Wynonia Hazard, [facility] Financial Counselor. Aaron Pearson explained to him the process for applying for the [employer] and [employer] as well as the [employer]. The patient can get his prescriptions filled at [place] and will need to obtain a blue card to get his prescriptions filled after this initial medication fill. He said that he has coverage with [place] program through 08/2017.  He met with Veneda Melter, [facility] pharmacy for medication instruction. As per [name], the patient's friend is assisting with insulin pen use and medication reminders as the patient has decreased vision.   The patient stated that he will pick up all of his his medication refills and [employer] glucometer  at the pharmacy on Thursday, 05/18/17 as he is not able to come tomorrow due to a cardiology appointment. As per the patient and [name], he has a few more days left of his medications and does not need to pick up the refills today.  His wound dressing was changed by Carilyn Goodpasture, RN/Triage Nurse and she instructed the patient's friend how to change the dressing and gave him dressing supplies.   The patient stated that he has applied for disability and has contacted an attorney but he is not sure of the status of the claim. He was agreeable to a referral to [employer] [facility] [place] and he completed and signed a referral form.  The referral was faxed to Abelino Derrick, Legal Aid of   This CM explained that Legal Aid will need to make sure that there are no conflicts of interest prior to providing any assistance to the patient  He also noted that someone from the hospital spoke to him about medicaid but he is not sure of the status of a medicaid application.  He has no income at this time  and receives $192/month in food stamps.    He was given bus passes for his cardiology appointment tomorrow and to return to [facility] pharmacy this week.

## 2017-05-16 NOTE — BH Specialist Note (Signed)
Integrated Behavioral Health Initial Visit  MRN: [id number] Name: Aaron Pearson  Number of [facility] Clinician visits:: 1/6 Session Start time: 11:00 AM  Session End time: 11:20 AM Total time: 20 minutes  Type of Service: [place] Interpretor:No. Interpretor Name and Language: N/A   Warm Hand Off Completed.       SUBJECTIVE: Aaron Pearson is a 47 y.o. male accompanied by Friend Patient was referred by Dr. Margarita Rana for anxiety and depression. Patient reports the following symptoms/concerns: feelings of sadness and worry, little interest in doing things, low energy, restlessness, and irritability Duration of problem: Ongoing; Severity of problem: moderate  OBJECTIVE: Mood: Anxious and Pleasant and Affect: Appropriate Risk of harm to self or others: No plan to harm self or others  LIFE CONTEXT: Family and Social: Pt currently resides at [facility]. He receives support from staff and a friend at the shelter School/Work: Pt is unemployed. He receives food stamps ($192). Pt has applied for disability and medicaid.  Self-Care: Per review of chart, pt has hx of polysubstance use. Pt is open to medication management through PCP Life Changes: Pt has ongoing medical concerns that resulted in recent hospitalizations. He is currently homeless, is experiencing financial strain, and receives limited support.   GOALS ADDRESSED: Patient Aaron Pearson: 1. Reduce symptoms of: anxiety and depression 2. Increase knowledge and/or ability of: coping skills  3. Demonstrate ability to: Increase healthy adjustment to current life circumstances and Increase adequate support systems for patient/family  INTERVENTIONS: Interventions utilized: Solution-Focused Strategies, Supportive Counseling, Psychoeducation and/or Health Education and Link to [employer]  Standardized Assessments completed: GAD-7 and PHQ 2&9  ASSESSMENT: Patient currently  experiencing depression and anxiety triggered by ongoing medical conditions, homelessness, financial strain, and limited support. He reports feelings of sadness and worry, little interest in doing things, low energy, restlessness, and irritability. He has hx of polysubstance use. He denied SI/HI/AVH. Pt was accompanied at visit by friend, Aaron Pearson.    Patient may benefit from psychoeducation and psychotherapy. Aaron Pearson educated pt on correlation between one's physical and mental health. Therapeutic interventions were discussed to decrease symptoms. Pt agreed to participate in medication management through PCP. He completed a Industrial/product designer and spoke with a Development worker, community to discuss options for financial assistance. Crisis intervention resources and transportation assistance were provided.   PLAN: 1. Follow up with behavioral health clinician on : Pt was encouraged to contact [facility] if symptoms worsen or fail to improve to schedule behavioral appointments at [facility] 2. Behavioral recommendations: LCSWA recommends that pt apply healthy coping skills discussed, comply with medication management, schedule appointment with financial counseling, and utilize provided resources. Pt is encouraged to schedule follow up appointment with LCSWA 3. Referral(s): [place] (In Clinic) and [employer] Resources:  Financial trader 4. "From scale of 1-10, how likely are you to follow plan?":   Aaron Chesterfield, LCSW 05/17/17 3:58 PM

## 2017-05-17 ENCOUNTER — Encounter: Payer: Self-pay | Admitting: Pediatric Intensive Care

## 2017-05-17 ENCOUNTER — Telehealth (HOSPITAL_COMMUNITY): Payer: Self-pay

## 2017-05-17 NOTE — Congregational Nurse Program (Signed)
Congregational Nurse Program Note  Date of Encounter: 05/15/2017  Past Medical History: Past Medical History:  Diagnosis Date  . Anxiety   . 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 Coronary artery disease   . Diabetes mellitus without complication ([place])   . GERD (gastroesophageal reflux disease)   . Headache   . Hx of diabetic neuropathy   . Hypertension   . Myocardial infarction ([place]) 2018  . Renal disorder     Encounter Details: CNP Questionnaire - 05/17/17 1030      Questionnaire   Patient Status  Not Applicable    Race  White or Caucasian    Location Patient Served At  [facility] [employer] [facility]  Not Applicable    Uninsured  Uninsured (Subsequent visits/quarter)    Food  Within past 12 months, worried food would run out with no money to buy more    Housing/Utilities  No permanent housing    Transportation  Yes, need transportation assistance;Provided transportation assistance (bus pass, taxi voucher, etc.)    Interpersonal Safety  No, do not feel physically and emotionally safe where you currently live    Medication  Yes, have medication insecurities    Medical Provider  Yes    Referrals  Primary Care Provider/Clinic    ED Visit Averted  Not Applicable    Life-Saving Intervention Made  Not Applicable      Clinical Intake - 05/16/17 0843      Pre-visit preparation   Pre-visit preparation completed  Yes      Pain   Pain   0-10    Pain Score  7     Pain Location  Chest      Nutrition Screen   Diabetes  Yes    CBG done?  Yes    CBG resulted in Enter/ Edit results?  Yes    Did pt. bring in CBG monitor from home?  No      Functional Status   Activities of Daily Living  Independent    Ambulation  Independent    Medication Administration  Independent    Home Management  Independent      Abuse/Neglect   Do you feel unsafe in your current relationship?  No    Do you feel physically threatened by others?  No    Anyone hurting you at home, work, or school?  No    Unable to ask?   No      Investment banker, operational Needed?  No     BP check and clinic follow up for transportation and appointments for [facility] and cardiology. Client states that he was seen in ED over the weekend to have wound on back of neck drained. He states that he was instructed to follow up in ED to have wound checked and dressing changed. CN contacted Eden Lathe at [facility] and client will have wound check at clinic. Clinic appointment changed to tomorrow. CN also reviewed cardiology follow up for Thursday. Bus passes provided for client and his friend Will Blanken who will accompany client and assist with navigating bus routes. Client will follow up in CN clinic on 5/10 to discuss follow up appointments and any medication/medical needs. Client agrees to plan.

## 2017-05-17 NOTE — Telephone Encounter (Signed)
Referral received. Attempted to call patient in regards to Insurance and Cardiac Rehab - lm on vm

## 2017-05-18 ENCOUNTER — Emergency Department (HOSPITAL_COMMUNITY)
Admission: EM | Admit: 2017-05-18 | Discharge: 2017-05-18 | Disposition: A | Discharge status: Home / Self Care | Payer: Self-pay | Attending: Emergency Medicine | Admitting: Emergency Medicine

## 2017-05-18 ENCOUNTER — Emergency Department (HOSPITAL_COMMUNITY): Payer: Self-pay

## 2017-05-18 ENCOUNTER — Ambulatory Visit: Payer: Self-pay | Admitting: Family Medicine

## 2017-05-18 ENCOUNTER — Other Ambulatory Visit: Payer: Self-pay

## 2017-05-18 DIAGNOSIS — R0789 Other chest pain: Secondary | ICD-10-CM

## 2017-05-18 DIAGNOSIS — I10 Essential (primary) hypertension: Secondary | ICD-10-CM | POA: Insufficient documentation

## 2017-05-18 DIAGNOSIS — R072 Precordial pain: Secondary | ICD-10-CM

## 2017-05-18 DIAGNOSIS — Z955 Presence of coronary angioplasty implant and graft: Secondary | ICD-10-CM | POA: Insufficient documentation

## 2017-05-18 DIAGNOSIS — Z7982 Long term (current) use of aspirin: Secondary | ICD-10-CM | POA: Insufficient documentation

## 2017-05-18 DIAGNOSIS — Z79899 Other long term (current) drug therapy: Secondary | ICD-10-CM | POA: Insufficient documentation

## 2017-05-18 DIAGNOSIS — Z794 Long term (current) use of insulin: Secondary | ICD-10-CM | POA: Insufficient documentation

## 2017-05-18 DIAGNOSIS — E119 Type 2 diabetes mellitus without complications: Secondary | ICD-10-CM | POA: Insufficient documentation

## 2017-05-18 DIAGNOSIS — I2510 Atherosclerotic heart disease of native coronary artery without angina pectoris: Secondary | ICD-10-CM | POA: Insufficient documentation

## 2017-05-18 LAB — D-DIMER, QUANTITATIVE (NOT AT ARMC): D DIMER QUANT: 1.12 ug{FEU}/mL — AB (ref 0.00–0.50)

## 2017-05-18 LAB — BASIC METABOLIC PANEL
Anion gap: 13 (ref 5–15)
BUN: 29 mg/dL — AB (ref 6–20)
CHLORIDE: 104 mmol/L (ref 101–111)
CO2: 19 mmol/L — AB (ref 22–32)
CREATININE: 1.31 mg/dL — AB (ref 0.61–1.24)
Calcium: 9.3 mg/dL (ref 8.9–10.3)
GFR calc Af Amer: >60 mL/min (ref >60)
GFR calc non Af Amer: >60 mL/min (ref >60)
Glucose, Bld: 245 mg/dL — ABNORMAL HIGH (ref 65–99)
POTASSIUM: 4.2 mmol/L (ref 3.5–5.1)
Sodium: 136 mmol/L (ref 135–145)

## 2017-05-18 LAB — CBC
HCT: 36 % — ABNORMAL LOW (ref 39.0–52.0)
Hemoglobin: 11.8 g/dL — ABNORMAL LOW (ref 13.0–17.0)
MCH: 27.7 pg (ref 26.0–34.0)
MCHC: 32.8 g/dL (ref 30.0–36.0)
MCV: 84.5 fL (ref 78.0–100.0)
Platelets: 450 K/uL — ABNORMAL HIGH (ref 150–400)
RBC: 4.26 MIL/uL (ref 4.22–5.81)
RDW: 15.4 % (ref 11.5–15.5)
WBC: 6.7 K/uL (ref 4.0–10.5)

## 2017-05-18 LAB — PROTIME-INR
INR: 0.98
PROTHROMBIN TIME: 12.9 s (ref 11.4–15.2)

## 2017-05-18 LAB — TROPONIN I: Troponin I: <0.03 ng/mL (ref <0.03)

## 2017-05-18 LAB — I-STAT TROPONIN, ED
Troponin i, poc: 0.01 ng/mL (ref 0.00–0.08)
Troponin i, poc: 0 ng/mL (ref 0.00–0.08)

## 2017-05-18 MED ORDER — SODIUM CHLORIDE 0.9 % IV SOLN: INTRAVENOUS | Status: DC

## 2017-05-18 MED ORDER — IOPAMIDOL (ISOVUE-370) INJECTION 76%
INTRAVENOUS | Status: AC
  Filled 2017-05-18: qty 100

## 2017-05-18 MED ORDER — MORPHINE SULFATE 15 MG PO TABS: 15.0000 mg | ORAL_TABLET | ORAL | Status: DC | PRN

## 2017-05-18 MED ORDER — IOPAMIDOL (ISOVUE-370) INJECTION 76%
100.0000 mL | Freq: Once | INTRAVENOUS | Status: AC | PRN
  Administered 2017-05-18: 100 mL via INTRAVENOUS

## 2017-05-18 MED ORDER — MORPHINE SULFATE 15 MG PO TABS
15.0000 mg | ORAL_TABLET | ORAL | Status: AC
  Administered 2017-05-18: 15 mg via ORAL
  Filled 2017-05-18: qty 1

## 2017-05-18 MED ORDER — FENTANYL CITRATE (PF) 100 MCG/2ML IJ SOLN
25.0000 ug | Freq: Once | INTRAMUSCULAR | Status: AC
  Administered 2017-05-18: 25 ug via INTRAVENOUS
  Filled 2017-05-18: qty 2

## 2017-05-18 NOTE — ED Notes (Signed)
Transported from radiology per rad tech  

## 2017-05-18 NOTE — ED Notes (Signed)
CT staff made aware of IV placement in FA

## 2017-05-18 NOTE — ED Notes (Signed)
ER MD in to assess

## 2017-05-18 NOTE — ED Provider Notes (Signed)
I received the patient in signout from Dr. Rogene Houston, briefly he is a 47 year old male with persistent chest pain after having a stent placed for an MI.  He has had multiple work-ups for this including a recent cath that was negative.  During the work-up today he was found to have a positive d-dimer.  The plan is for a CT angiogram of the chest to evaluate for PE.  Likely this returned and was negative.  He did have an incidental thyroid nodule which I discussed with him.  PCP, cards follow-up.   Deno Etienne, DO 05/18/17 1730

## 2017-05-18 NOTE — ED Notes (Signed)
Transported to radiology via stretcher per rad tech  

## 2017-05-18 NOTE — ED Triage Notes (Signed)
BIB EMS for c/o central CP rad into left arm and left jaw - onset 2200 last PM - constant - also reports left arms feels "weak"; (+) cardiac hx - reportedly has 9 stents - saw Junius Finner at [facility] for same yesterday - lastr stented x2 weeks ago - went back in x1 week ago for continued symptoms; has been taking Brilenta since January - reports has not missed a dose; was given NTG x4, Morphine 4 mg IVP, Zofran 4 mg IVP and NS 400 cc bolus per EMS PTA

## 2017-05-18 NOTE — ED Provider Notes (Signed)
[facility] EMERGENCY DEPARTMENT Provider Note   CSN: [id number] Arrival date & time: 05/18/17  0800     History   Chief Complaint Chief Complaint  Patient presents with  . Chest Pain    HPI Aaron Pearson is a 47 y.o. male.  Patient with complaint of anterior chest pain across both sides of the chest.  Sometimes radiates to the left arm but never has been pain-free since his most recent discharge.  Patient was admitted April 30 for acute coronary syndrome.  Here at [facility].  Was ruled out.  Patient had cardiac cath following admission for unstable angina on April 25.  A cardiac cath was done April 26.  With stent placement.  Heart catheterization was repeated on May 1.  Without any significant findings for anything to stent.  As stated patient had constipation since then.  Patient seen on May 5 for abscess on the side of his neck as well as chest pain.  He was discharged home after rule out.  Came back on May 6 for chest pain but left without being seen.  Patient denies any shortness of breath or leg swelling.  The pain is been present every day since his most recent discharge and cardiac cath.     Past Medical History:  Diagnosis Date  . Anxiety   . 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 Coronary artery disease   . Diabetes mellitus without complication ([place])   . GERD (gastroesophageal reflux disease)   . Headache   . Hx of diabetic neuropathy   . Hypertension   . Myocardial infarction ([place]) 2018  . Renal disorder     Patient Active Problem List   Diagnosis Date Noted  . Hypertension 05/16/2017  . Coronary artery disease 05/16/2017  . Anxiety and depression 05/16/2017  . Chronic pain 05/16/2017  . GERD (gastroesophageal reflux disease) 05/16/2017  . Type 2 diabetes mellitus ([place]) 05/16/2017  . Unstable angina ([place]) 05/05/2017  . Chest pain 05/04/2017    Past Surgical History:  Procedure Laterality Date  . CARDIAC CATHETERIZATION  05/05/2017  . CARPAL  TUNNEL RELEASE    . CORONARY STENT INTERVENTION N/A 05/05/2017   Procedure: CORONARY STENT INTERVENTION;  Surgeon: Nigel Mormon, MD;  Location: [facility];  Service: Cardiovascular;  Laterality: N/A;  . CORONARY STENT PLACEMENT    . INTRAVASCULAR PRESSURE WIRE/FFR STUDY N/A 05/05/2017   Procedure: INTRAVASCULAR PRESSURE WIRE/FFR STUDY;  Surgeon: Nigel Mormon, MD;  Location: [facility];  Service: Cardiovascular;  Laterality: N/A;  . INTRAVASCULAR PRESSURE WIRE/FFR STUDY N/A 05/10/2017   Procedure: INTRAVASCULAR PRESSURE WIRE/FFR STUDY;  Surgeon: Nigel Mormon, MD;  Location: [facility];  Service: Cardiovascular;  Laterality: N/A;  . LEFT HEART CATH AND CORONARY ANGIOGRAPHY N/A 05/05/2017   Procedure: LEFT HEART CATH AND CORONARY ANGIOGRAPHY;  Surgeon: Nigel Mormon, MD;  Location: [facility];  Service: Cardiovascular;  Laterality: N/A;  . LEFT HEART CATH AND CORONARY ANGIOGRAPHY N/A 05/10/2017   Procedure: LEFT HEART CATH AND CORONARY ANGIOGRAPHY;  Surgeon: Nigel Mormon, MD;  Location: [facility];  Service: Cardiovascular;  Laterality: N/A;        Home Medications    Prior to Admission medications   Medication Sig Start Date End Date Taking? Authorizing Provider  acetaminophen (TYLENOL) 325 MG tablet Take 2 tablets (650 mg total) by mouth every 6 (six) hours as needed for moderate pain or headache. 05/06/17  Yes Patwardhan, Manish J, MD  amLODipine (NORVASC) 10 MG tablet Take 1 tablet (10 mg total) by mouth daily. 05/16/17  Yes Charlott Rakes, MD  aspirin EC 81 MG tablet Take 81 mg by mouth daily. 04/06/17  Yes [provider]  atorvastatin (LIPITOR) 80 MG tablet Take 1 tablet (80 mg total) by mouth daily at 6 PM. 05/16/17 05/16/18 Yes Newlin, Enobong, MD  DULoxetine (CYMBALTA) 60 MG capsule Take 1 capsule (60 mg total) by mouth daily. 05/16/17  Yes Charlott Rakes, MD  furosemide (LASIX) 40 MG tablet Take 0.5 tablets (20  mg total) by mouth 2 (two) times daily. 05/16/17 05/16/18 Yes Charlott Rakes, MD  gabapentin (NEURONTIN) 300 MG capsule Take 2 capsules (600 mg total) by mouth 3 (three) times daily. 05/16/17  Yes Newlin, Enobong, MD  insulin aspart (NOVOLOG FLEXPEN) 100 UNIT/ML FlexPen Inject 10 Units into the skin 3 (three) times daily with meals. 05/16/17  Yes Charlott Rakes, MD  Insulin Glargine (LANTUS SOLOSTAR) 100 UNIT/ML Solostar Pen Inject 35 Units into the skin daily at 10 pm. 05/16/17  Yes Newlin, Enobong, MD  lidocaine (LIDODERM) 5 % Place 1 patch onto the skin daily. Remove & Discard patch within 12 hours or as directed by MD 05/16/17  Yes Charlott Rakes, MD  lisinopril (PRINIVIL,ZESTRIL) 40 MG tablet Take 1 tablet (40 mg total) by mouth every morning. 05/16/17  Yes Charlott Rakes, MD  metoprolol tartrate (LOPRESSOR) 100 MG tablet Take 1 tablet (100 mg total) by mouth 2 (two) times daily. 05/16/17 06/23/17 Yes Charlott Rakes, MD  mupirocin ointment (BACTROBAN) 2 % Place 1 application into the nose 2 (two) times daily. 05/05/17  Yes Patwardhan, Manish J, MD  nitroGLYCERIN (NITROSTAT) 0.4 MG SL tablet Place 1 tablet (0.4 mg total) under the tongue See admin instructions. Place 1 tablet (0.4mg ) under tongue every 5 minutes as needed for chest pain. Call doctor/ 911 if take 2 doses. Max 3/Day 05/06/17  Yes Patwardhan, Manish J, MD  omeprazole (PRILOSEC) 20 MG capsule Take 1 capsule (20 mg total) by mouth daily for 14 days. 05/16/17 05/30/17 Yes Charlott Rakes, MD  potassium chloride SA (K-DUR,KLOR-CON) 20 MEQ tablet Take 1 tablet (20 mEq total) by mouth daily. 05/16/17  Yes Charlott Rakes, MD  sucralfate (CARAFATE) 1 g tablet Take 1 tablet (1 g total) by mouth 4 (four) times daily -  with meals and at bedtime for 10 days. 05/14/17 05/24/17 Yes Duffy Bruce, MD  sulfamethoxazole-trimethoprim (BACTRIM DS,SEPTRA DS) 800-160 MG tablet Take 1 tablet by mouth 2 (two) times daily for 7 days. 05/14/17 05/21/17 Yes Duffy Bruce, MD    ticagrelor (BRILINTA) 90 MG TABS tablet Take 1 tablet (90 mg total) by mouth 2 (two) times daily. 05/16/17  Yes Charlott Rakes, MD  Blood Glucose Monitoring Suppl (TRUE METRIX METER) DEVI 1 each by Does not apply route 3 (three) times daily before meals. 05/16/17   Charlott Rakes, MD  diclofenac sodium (VOLTAREN) 1 % GEL Apply 4 g topically 4 (four) times daily. 05/16/17   Charlott Rakes, MD  glucose blood (TRUE METRIX BLOOD GLUCOSE TEST) test strip 3 times daily before meals 05/16/17   Charlott Rakes, MD  TRUEPLUS LANCETS 28G MISC 1 each by Does not apply route 3 (three) times daily before meals. 05/16/17   Charlott Rakes, MD    Family History Family History  Problem Relation Age of Onset  . Coronary artery disease Mother   . Coronary artery disease Father     Social History Social History  Tobacco Use  . Smoking status: Never Smoker  . Smokeless tobacco: Never Used  Substance Use Topics  . Alcohol use: Never    Frequency: Never  . Drug use: Never     Allergies   Benadryl [diphenhydramine] and Penicillins   Review of Systems Review of Systems  Constitutional: Negative for fever.  HENT: Negative for congestion.   Eyes: Negative for visual disturbance.  Respiratory: Negative for shortness of breath.   Cardiovascular: Positive for chest pain.  Gastrointestinal: Negative for nausea and vomiting.  Genitourinary: Negative for dysuria.  Musculoskeletal: Negative for back pain.  Skin: Negative for rash.  Neurological: Negative for syncope.  Hematological: Does not bruise/bleed easily.  Psychiatric/Behavioral: Negative for confusion.     Physical Exam Updated Vital Signs BP (!) 141/85   Pulse 88   Temp 98.2 F (36.8 C) (Oral)   Resp 14   Ht 1.727 m (5\' 8" )   Wt 90.7 kg (200 lb)   SpO2 99%   BMI 30.41 kg/m   Physical Exam  Constitutional: He is oriented to person, place, and time. He appears well-developed and well-nourished. No distress.  HENT:  Head:  Normocephalic and atraumatic.  Mouth/Throat: Oropharynx is clear and moist.  Eyes: Pupils are equal, round, and reactive to light. Conjunctivae and EOM are normal.  Neck: Neck supple.  Cardiovascular: Normal rate, regular rhythm and normal heart sounds.  Pulmonary/Chest: Effort normal and breath sounds normal. No respiratory distress.  Abdominal: Soft. Bowel sounds are normal. There is no tenderness.  Musculoskeletal: Normal range of motion. He exhibits no edema.  Neurological: He is alert and oriented to person, place, and time. No cranial nerve deficit or sensory deficit. He exhibits normal muscle tone. Coordination normal.  Skin: Skin is warm.  Nursing note and vitals reviewed.    ED Treatments / Results  Labs (all labs ordered are listed, but only abnormal results are displayed) Labs Reviewed  BASIC METABOLIC PANEL - Abnormal; Notable for the following components:      Result Value   CO2 19 (*)    Glucose, Bld 245 (*)    BUN 29 (*)    Creatinine, Ser 1.31 (*)    All other components within normal limits  CBC - Abnormal; Notable for the following components:   Hemoglobin 11.8 (*)    HCT 36.0 (*)    Platelets 450 (*)    All other components within normal limits  D-DIMER, QUANTITATIVE (NOT AT [facility]) - Abnormal; Notable for the following components:   D-Dimer, Quant 1.12 (*)    All other components within normal limits  PROTIME-INR  TROPONIN I  I-STAT TROPONIN, ED  I-STAT TROPONIN, ED    EKG None  Radiology Dg Chest 2 View  Result Date: 05/18/2017 CLINICAL DATA:  Chest pain. EXAM: CHEST - 2 VIEW COMPARISON:  Chest x-ray dated May 14, 2017. FINDINGS: The heart size and mediastinal contours are within normal limits. Both lungs are clear. The visualized skeletal structures are unremarkable. IMPRESSION: No active cardiopulmonary disease. Electronically Signed   By: Titus Dubin M.D.   On: 05/18/2017 09:23    Procedures Procedures (including critical care  time)  Medications Ordered in ED Medications  0.9 %  sodium chloride infusion (has no administration in time range)  fentaNYL (SUBLIMAZE) injection 25 mcg (25 mcg Intravenous Given 05/18/17 1012)  fentaNYL (SUBLIMAZE) injection 25 mcg (25 mcg Intravenous Given 05/18/17 1407)     Initial Impression / Assessment and Plan / ED Course  I have reviewed  the triage vital signs and the nursing notes.  Pertinent labs & imaging results that were available during my care of the patient were reviewed by me and considered in my medical decision making (see chart for details).   Work-up here for the constant chest pain  since discharge.   And also has had pain radiating to left arm.  But is never been pain-free.  Troponins x2 are negative EKG without acute finding chest x-ray negative.  Labs show some mild renal insufficiency however d-dimer was elevated and patient will receive CT angios.  Disposition will be based on CT angios.  Negative for PE patient is not showing evidence of acute coronary event.  Also has had 2 caps recently.  Patient can follow-up with cardiology.  If he has a PE will require admission.       Final Clinical Impressions(s) / ED Diagnoses   Final diagnoses:  Precordial pain    ED Discharge Orders    None       Fredia Sorrow, MD 05/18/17 [phone]

## 2017-05-18 NOTE — Discharge Instructions (Signed)
Incidentally on the CT scan there was a thyroid nodule.  The radiologist is recommending that this gets a ultrasound done as an outpatient.  Please discuss this with your family physician and see what they recommend.

## 2017-05-18 NOTE — ED Notes (Signed)
EKG done

## 2017-05-19 ENCOUNTER — Telehealth: Payer: Self-pay | Admitting: Family Medicine

## 2017-05-19 NOTE — Telephone Encounter (Signed)
[place] H. returned my call and she informed me that she is aware of patient (his behavior and personality) and met with him yesterday. During their conversation patient was making requests that were not adequate (considering his situation) such as: he wanted food brought upstairs to his bed (at the [facility]) in case his blood sugar level drops, and wanted to be placed on bed rest.   Patient also mentioned that he went to his cardiologist appointment, and provider wanted to prescribe another medication but he didn't have samples to give patient at the time. [place] will have to contact manufacturer but she believes that medication will be very expensive.

## 2017-05-19 NOTE — Telephone Encounter (Signed)
Call placed to Talbert Cage, congregational nurse, [phone] to give her an update on patient. No answer. Unable to leave voice message since voicemail is full. Kerby an e-mail.

## 2017-05-23 ENCOUNTER — Encounter: Payer: Self-pay | Admitting: Pediatric Intensive Care

## 2017-05-23 ENCOUNTER — Telehealth: Payer: Self-pay

## 2017-05-23 NOTE — Telephone Encounter (Signed)
Call received from [place], [place] Ashley noting that she has been in contact with the patient and he has stated that he is taking his medications. She also noted that she set up his glucometer for him ,  She noted that she did not have any additional information about a new medication from the cardiologist.

## 2017-05-23 NOTE — Congregational Nurse Program (Signed)
Congregational Nurse Program Note  Date of Encounter: 05/23/2017  Past Medical History: Past Medical History:  Diagnosis Date  . Anxiety   . 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 Coronary artery disease   . Diabetes mellitus without complication ([place])   . GERD (gastroesophageal reflux disease)   . Headache   . Hx of diabetic neuropathy   . Hypertension   . Myocardial infarction ([place]) 2018  . Renal disorder     Encounter Details: CNP Questionnaire - 05/23/17 0945      Questionnaire   Patient Status  Not Applicable    Race  White or Caucasian    Location Patient Served At  [facility]  Not Applicable    Uninsured  Uninsured (Subsequent visits/quarter)    Food  Within past 12 months, worried food would run out with no money to buy more    Housing/Utilities  No permanent housing    Transportation  No transportation needs    Interpersonal Safety  No, do not feel physically and emotionally safe where you currently live    Medication  No medication insecurities    Medical Provider  Yes    Referrals  Not Applicable    ED Visit Averted  Not Applicable    Life-Saving Intervention Made  Not Applicable      Clinical Intake - 05/16/17 0843      Pre-visit preparation   Pre-visit preparation completed  Yes      Pain   Pain   0-10    Pain Score  7     Pain Location  Chest      Nutrition Screen   Diabetes  Yes    CBG done?  Yes    CBG resulted in Enter/ Edit results?  Yes    Did pt. bring in CBG monitor from home?  No      Functional Status   Activities of Daily Living  Independent    Ambulation  Independent    Medication Administration  Independent    Home Management  Independent      Abuse/Neglect   Do you feel unsafe in your current relationship?  No    Do you feel physically threatened by others?  No    Anyone hurting you at home, work, or school?  No    Unable to ask?  No      Investment banker, operational Needed?  No     BP check. States he took his  medication this morning. Also states he took 35 units of insulin last night- "I checked my blood sugar this morning and it was OK". Client continues to request more bedrest. CN states that bedrest needs to be a collaborative effort with client, CN and staff.

## 2017-05-26 ENCOUNTER — Telehealth: Payer: Self-pay | Admitting: Family Medicine

## 2017-05-26 NOTE — Telephone Encounter (Signed)
Aaron Pearson, Aaron Pearson, Aaron (physical sciences), called to confirm if patient had been discharged from the office [facility]). In a recent conversation with patient, Mr. Aaron Pearson informed Aaron Pearson that his disability was not approved that he had been discharged from the office. He didn't specify which office but when Aaron Pearson asked patient if it was [facility], patient nodded yes. Informed Aaron Pearson that patient had not been discharged and that he has an upcoming appointment.

## 2017-05-29 ENCOUNTER — Telehealth: Payer: Self-pay

## 2017-05-29 ENCOUNTER — Ambulatory Visit: Payer: Self-pay | Attending: Family Medicine | Admitting: Family Medicine

## 2017-05-29 ENCOUNTER — Other Ambulatory Visit: Payer: Self-pay

## 2017-05-29 ENCOUNTER — Encounter: Payer: Self-pay | Admitting: Pediatric Intensive Care

## 2017-05-29 VITALS — BP 159/101 | HR 104 | Wt 209.0 lb

## 2017-05-29 DIAGNOSIS — Z888 Allergy status to other drugs, medicaments and biological substances status: Secondary | ICD-10-CM | POA: Insufficient documentation

## 2017-05-29 DIAGNOSIS — Z955 Presence of coronary angioplasty implant and graft: Secondary | ICD-10-CM | POA: Insufficient documentation

## 2017-05-29 DIAGNOSIS — Z9889 Other specified postprocedural states: Secondary | ICD-10-CM | POA: Insufficient documentation

## 2017-05-29 DIAGNOSIS — E1165 Type 2 diabetes mellitus with hyperglycemia: Secondary | ICD-10-CM | POA: Insufficient documentation

## 2017-05-29 DIAGNOSIS — E1140 Type 2 diabetes mellitus with diabetic neuropathy, unspecified: Secondary | ICD-10-CM | POA: Insufficient documentation

## 2017-05-29 DIAGNOSIS — I252 Old myocardial infarction: Secondary | ICD-10-CM | POA: Insufficient documentation

## 2017-05-29 DIAGNOSIS — Z79899 Other long term (current) drug therapy: Secondary | ICD-10-CM | POA: Insufficient documentation

## 2017-05-29 DIAGNOSIS — Z794 Long term (current) use of insulin: Secondary | ICD-10-CM | POA: Insufficient documentation

## 2017-05-29 DIAGNOSIS — I25110 Atherosclerotic heart disease of native coronary artery with unstable angina pectoris: Secondary | ICD-10-CM

## 2017-05-29 DIAGNOSIS — R079 Chest pain, unspecified: Secondary | ICD-10-CM | POA: Insufficient documentation

## 2017-05-29 DIAGNOSIS — E1169 Type 2 diabetes mellitus with other specified complication: Secondary | ICD-10-CM | POA: Insufficient documentation

## 2017-05-29 DIAGNOSIS — Z7982 Long term (current) use of aspirin: Secondary | ICD-10-CM | POA: Insufficient documentation

## 2017-05-29 DIAGNOSIS — Z880 Allergy status to penicillin: Secondary | ICD-10-CM | POA: Insufficient documentation

## 2017-05-29 DIAGNOSIS — I10 Essential (primary) hypertension: Secondary | ICD-10-CM | POA: Insufficient documentation

## 2017-05-29 DIAGNOSIS — F419 Anxiety disorder, unspecified: Secondary | ICD-10-CM | POA: Insufficient documentation

## 2017-05-29 DIAGNOSIS — I2510 Atherosclerotic heart disease of native coronary artery without angina pectoris: Secondary | ICD-10-CM | POA: Insufficient documentation

## 2017-05-29 DIAGNOSIS — E041 Nontoxic single thyroid nodule: Secondary | ICD-10-CM | POA: Insufficient documentation

## 2017-05-29 DIAGNOSIS — E1149 Type 2 diabetes mellitus with other diabetic neurological complication: Secondary | ICD-10-CM

## 2017-05-29 DIAGNOSIS — G8929 Other chronic pain: Secondary | ICD-10-CM | POA: Insufficient documentation

## 2017-05-29 DIAGNOSIS — K219 Gastro-esophageal reflux disease without esophagitis: Secondary | ICD-10-CM | POA: Insufficient documentation

## 2017-05-29 LAB — GLUCOSE, POCT (MANUAL RESULT ENTRY): POC Glucose: 335 mg/dl — AB (ref 70–99)

## 2017-05-29 MED ORDER — GABAPENTIN 300 MG PO CAPS: 900.0000 mg | ORAL_CAPSULE | Freq: Three times a day (TID) | ORAL | 0 refills | Status: DC

## 2017-05-29 MED FILL — GABAPENTIN 300 MG CAPSULE: 300 | 30 days supply | Qty: 270 | Fill #0

## 2017-05-29 NOTE — Congregational Nurse Program (Signed)
Congregational Nurse Program Note  Date of Encounter: 05/29/2017  Past Medical History: Past Medical History:  Diagnosis Date  . Anxiety   . 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 Coronary artery disease   . Diabetes mellitus without complication ([place])   . GERD (gastroesophageal reflux disease)   . Headache   . Hx of diabetic neuropathy   . Hypertension   . Myocardial infarction ([place]) 2018  . Renal disorder     Encounter Details: CNP Questionnaire - 05/29/17 1000      Questionnaire   Patient Status  Not Applicable    Race  White or Caucasian    Location Patient Served At  [facility]  Not Applicable    Uninsured  Uninsured (Subsequent visits/quarter)    Food  Within past 12 months, worried food would run out with no money to buy more;Yes, have food insecurities    Housing/Utilities  No permanent housing    Transportation  Yes, need transportation assistance;Provided transportation assistance (bus pass, taxi voucher, etc.)    Interpersonal Safety  No, do not feel physically and emotionally safe where you currently live    Medication  Yes, have medication insecurities    Medical Provider  Yes    Referrals  [facility];Primary Care Provider/Clinic    ED Visit Averted  Yes    Life-Saving Intervention Made  Not Applicable      Clinical Intake - 05/16/17 0843      Pre-visit preparation   Pre-visit preparation completed  Yes      Pain   Pain   0-10    Pain Score  7     Pain Location  Chest      Nutrition Screen   Diabetes  Yes    CBG done?  Yes    CBG resulted in Enter/ Edit results?  Yes    Did pt. bring in CBG monitor from home?  No      Functional Status   Activities of Daily Living  Independent    Ambulation  Independent    Medication Administration  Independent    Home Management  Independent      Abuse/Neglect   Do you feel unsafe in your current relationship?  No    Do you feel physically threatened by others?  No    Anyone hurting you at home,  work, or school?  No    Unable to ask?  No      Investment banker, operational Needed?  No     Client needs assistance with form completion for Medicaid. He states that he is very concerned he will not have resources for medication. CN called Eden Lathe at [facility]- she arranged appointment for client to meet with [employer] navigator this afternoon. CN will provide taxi vouchers for client. Client states that his calves are tender bilaterally- he has non-pitting edema of lower extremities. Noted elevated BP. Client states that he has been taking medication as prescribed and that his BG was 220 this morning. Client to follow up in CN clinic tomorrow morning.

## 2017-05-29 NOTE — Progress Notes (Signed)
Subjective:  Patient ID: Aaron Pearson, male    DOB: 1970/09/08  Age: 46 y.o. MRN: [id number]  CC: Blood Sugar Problem and Blood Pressure Check   HPI [name] is a 47 year old male with a history of type 2 diabetes mellitus (A1c 10.7), hypertension, coronary artery disease (status post multiple prior PCI) with most recent PCI for unstable angina to his left circumflex and LAD on 05/05/17.  Repeat cardiac cath on 05/10/2017 revealed no in stent thrombosis recommendation was for dual antiplatelet therapy with aspirin and Brilinta for 1 year.   He presents to the clinic today complaining his blood pressure has been elevated due to pain.  On further questioning he tells me he has pain in his head, his chest which is described as throbbing and it hurts all the time.  He has pain in his bilateral lower extremities which make it difficult to walk and he also has diabetic neuropathy.  I had placed him on Cymbalta at his last office visit which he takes in addition to gabapentin and Voltaren gel and he informs me these have not helped.  He also tells me he received oxycodone in the past from a previous PCP. He was seen at the ED for chest pain on 05/15/2017 and left prior to being seen and again on 5/9/2019where he had elevated d-dimer and CT angiogram was negative for PE but revealed incidental thyroid nodule. He was recently seen by his cardiologist.  His blood sugar is elevated at 345 today and he did not take NovoLog today as he is yet to eat.  He took his Lantus last night  Past Medical History:  Diagnosis Date  . Anxiety   . 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 Coronary artery disease   . Diabetes mellitus without complication ([place])   . GERD (gastroesophageal reflux disease)   . Headache   . Hx of diabetic neuropathy   . Hypertension   . Myocardial infarction ([place]) 2018  . Renal disorder     Past Surgical History:  Procedure Laterality Date  . CARDIAC CATHETERIZATION  05/05/2017  . CARPAL TUNNEL  RELEASE    . CORONARY STENT INTERVENTION N/A 05/05/2017   Procedure: CORONARY STENT INTERVENTION;  Surgeon: Nigel Mormon, MD;  Location: [facility];  Service: Cardiovascular;  Laterality: N/A;  . CORONARY STENT PLACEMENT    . INTRAVASCULAR PRESSURE WIRE/FFR STUDY N/A 05/05/2017   Procedure: INTRAVASCULAR PRESSURE WIRE/FFR STUDY;  Surgeon: Nigel Mormon, MD;  Location: [facility];  Service: Cardiovascular;  Laterality: N/A;  . INTRAVASCULAR PRESSURE WIRE/FFR STUDY N/A 05/10/2017   Procedure: INTRAVASCULAR PRESSURE WIRE/FFR STUDY;  Surgeon: Nigel Mormon, MD;  Location: [facility];  Service: Cardiovascular;  Laterality: N/A;  . LEFT HEART CATH AND CORONARY ANGIOGRAPHY N/A 05/05/2017   Procedure: LEFT HEART CATH AND CORONARY ANGIOGRAPHY;  Surgeon: Nigel Mormon, MD;  Location: [facility];  Service: Cardiovascular;  Laterality: N/A;  . LEFT HEART CATH AND CORONARY ANGIOGRAPHY N/A 05/10/2017   Procedure: LEFT HEART CATH AND CORONARY ANGIOGRAPHY;  Surgeon: Nigel Mormon, MD;  Location: [facility];  Service: Cardiovascular;  Laterality: N/A;    Allergies  Allergen Reactions  . Benadryl [Diphenhydramine] Nausea Only  . Penicillins     Childhood allergy  Has patient had a PCN reaction causing immediate rash, facial/tongue/throat swelling, SOB or lightheadedness with hypotension: Unknown Has patient had a PCN reaction causing severe rash involving mucus membranes or  skin necrosis: Unknown Has patient had a PCN reaction that required hospitalization: Unknown Has patient had a PCN reaction occurring within the last 10 years: No If all of the above answers are "NO", then may proceed with Cephalosporin use.      Outpatient Medications Prior to Visit  Medication Sig Dispense Refill  . amLODipine (NORVASC) 10 MG tablet Take 1 tablet (10 mg total) by mouth daily. 30 tablet 3  . aspirin EC 81 MG tablet Take 81 mg by mouth daily.    Marland Kitchen  atorvastatin (LIPITOR) 80 MG tablet Take 1 tablet (80 mg total) by mouth daily at 6 PM. 30 tablet 3  . diclofenac sodium (VOLTAREN) 1 % GEL Apply 4 g topically 4 (four) times daily. 100 g 1  . DULoxetine (CYMBALTA) 60 MG capsule Take 1 capsule (60 mg total) by mouth daily. 30 capsule 3  . furosemide (LASIX) 40 MG tablet Take 0.5 tablets (20 mg total) by mouth 2 (two) times daily. 60 tablet 3  . insulin aspart (NOVOLOG FLEXPEN) 100 UNIT/ML FlexPen Inject 10 Units into the skin 3 (three) times daily with meals. 30 mL 3  . Insulin Glargine (LANTUS SOLOSTAR) 100 UNIT/ML Solostar Pen Inject 35 Units into the skin daily at 10 pm. 5 pen 3  . lidocaine (LIDODERM) 5 % Place 1 patch onto the skin daily. Remove & Discard patch within 12 hours or as directed by MD 30 patch 1  . lisinopril (PRINIVIL,ZESTRIL) 40 MG tablet Take 1 tablet (40 mg total) by mouth every morning. 30 tablet 3  . metoprolol tartrate (LOPRESSOR) 100 MG tablet Take 1 tablet (100 mg total) by mouth 2 (two) times daily. 60 tablet 3  . nitroGLYCERIN (NITROSTAT) 0.4 MG SL tablet Place 1 tablet (0.4 mg total) under the tongue See admin instructions. Place 1 tablet (0.4mg ) under tongue every 5 minutes as needed for chest pain. Call doctor/ 911 if take 2 doses. Max 3/Day 30 tablet 3  . omeprazole (PRILOSEC) 20 MG capsule Take 1 capsule (20 mg total) by mouth daily for 14 days. 30 capsule 3  . potassium chloride SA (K-DUR,KLOR-CON) 20 MEQ tablet Take 1 tablet (20 mEq total) by mouth daily. 30 tablet 3  . ticagrelor (BRILINTA) 90 MG TABS tablet Take 1 tablet (90 mg total) by mouth 2 (two) times daily. 180 tablet 3  . gabapentin (NEURONTIN) 300 MG capsule Take 2 capsules (600 mg total) by mouth 3 (three) times daily. 60 capsule 3  . acetaminophen (TYLENOL) 325 MG tablet Take 2 tablets (650 mg total) by mouth every 6 (six) hours as needed for moderate pain or headache. 90 tablet 3  . Blood Glucose Monitoring Suppl (TRUE METRIX METER) DEVI 1 each by Does  not apply route 3 (three) times daily before meals. 1 Device 0  . glucose blood (TRUE METRIX BLOOD GLUCOSE TEST) test strip 3 times daily before meals 100 each 12  . mupirocin ointment (BACTROBAN) 2 % Place 1 application into the nose 2 (two) times daily. 22 g 0  . sucralfate (CARAFATE) 1 g tablet Take 1 tablet (1 g total) by mouth 4 (four) times daily -  with meals and at bedtime for 10 days. 40 tablet 0  . TRUEPLUS LANCETS 28G MISC 1 each by Does not apply route 3 (three) times daily before meals. 100 each 12   No facility-administered medications prior to visit.     ROS Review of Systems  Constitutional: Negative for activity change and appetite change.  HENT:  Negative for sinus pressure and sore throat.   Eyes: Negative for visual disturbance.  Respiratory: Negative for cough, chest tightness and shortness of breath.   Cardiovascular: Negative for chest pain and leg swelling.  Gastrointestinal: Negative for abdominal distention, abdominal pain, constipation and diarrhea.  Endocrine: Negative.   Genitourinary: Negative for dysuria.  Musculoskeletal:       See hpi  Skin: Negative for rash.  Allergic/Immunologic: Negative.   Neurological: Negative for weakness, light-headedness and numbness.  Psychiatric/Behavioral: Negative for dysphoric mood and suicidal ideas.    Objective:  BP (!) 159/101 (BP Location: Left Arm, Patient Position: Sitting, Cuff Size: Large)   Pulse (!) 104   Wt 209 lb (94.8 kg)   SpO2 97%   BMI 31.78 kg/m   BP/Weight 05/29/2017 05/29/2017 1/61/0960  Systolic BP 454 098 119  Diastolic BP 147 829 562  Wt. (Lbs) 209 - -  BMI 31.78 - -     Physical Exam  Constitutional: He is oriented to person, place, and time. He appears well-developed and well-nourished.  Cardiovascular: Normal rate, normal heart sounds and intact distal pulses.  No murmur heard. Pulmonary/Chest: Effort normal and breath sounds normal. He has no wheezes. He has no rales. He exhibits no  tenderness.  Abdominal: Soft. Bowel sounds are normal. He exhibits no distension and no mass. There is no tenderness.  Musculoskeletal: Normal range of motion. He exhibits no tenderness (no tenderness ellicited on my exam).  Neurological: He is alert and oriented to person, place, and time.  Psychiatric: He has a normal mood and affect.     Assessment & Plan:   1. Essential hypertension Uncontrolled, blood pressure was 128/85 at his last visit He attributes this to pain No regimen change today Continue low sodium, DASH diet and bring in all medications at his next visit We will reassess blood pressure at his next visit.  2. Other diabetic neurological complication associated with type 2 diabetes mellitus ([place]) Uncontrolled diabetes mellitus with A1c of 10.7, CBG of 345 in the clinic - he is yet to take his 10 units of NovoLog Increase Lantus to 42 units twice daily I have increased gabapentin to 900 mg 3 times daily Consider switching to Lyrica if symptoms remain uncontrolled - Drug Screen 12+Alcohol+CRT, Ur  3. Other chronic pain Complains of pain all over - his head, his chest, his legs and I have explained to him that those do not justify the need for narcotic - gabapentin (NEURONTIN) 300 MG capsule; Take 3 capsules (900 mg total) by mouth 3 (three) times daily.  Dispense: 270 capsule; Refill: 0  4. CAD  Status post PCI Continue Brilinta and aspirin Risk factor modification He continues to have chest pains-has had multiple ED visit for same Advised to keep appointment with cardiology  Incidental thyroid nodule found on most recent CT-we will order thyroid ultrasound at next visit  Meds ordered this encounter  Medications  . gabapentin (NEURONTIN) 300 MG capsule    Sig: Take 3 capsules (900 mg total) by mouth 3 (three) times daily.    Dispense:  270 capsule    Refill:  0    Follow-up: Return for follow up of chronic medical conditions, keep previously scheduled  appointment.   Charlott Rakes MD

## 2017-05-29 NOTE — Telephone Encounter (Signed)
Met with patient when he was in the clinic today.  He first met with Wynonia Hazard, [facility] Financial Counselor and obtained his [employer].  As per Clifton James, the patient is waiting for a new EBT card and when he receives the card he is to call Clifton James with the EBT number. Clifton James also noted that the patient does not need to come back to the clinic to complete the CAFA and [employer].  He just needs to complete the [employer] and fax it back to this office. This CM provided him with the [facility] application and instructed him to complete to the best of his ability and then have [place], [place] fax to this clinic.  The patient stated that he understood and would see [place] tomorrow.   The patient is also seeing Dr Margarita Rana while he is in the clinic due his elevated BP and blood sugar that were checked by Carilyn Goodpasture, RN/ [facility].   Call placed to [place] and a voicemail message was left with an update on the patient's visit to [facility] today.

## 2017-05-29 NOTE — Progress Notes (Signed)
Pt states nurse at the shelter took his blood pressure today and was noted to be elevated: 180's/ 112. He came in the office today asked to have blood pressure checked.  Per pt, all medications were taken today. Pt states, " My blood pressure is high because I am in pain."Cymbalta and other medication for pain is not helping". C/o headache.  He also had concern that his blood sugar was 345. This morning his blood sugar was 220. He states he took his insulin this morning.

## 2017-05-29 NOTE — Patient Instructions (Signed)
Diabetic Neuropathy Diabetic neuropathy is a nerve disease or nerve damage that is caused by diabetes mellitus. About half of all people with diabetes mellitus have some form of nerve damage. Nerve damage is more common in those who have had diabetes mellitus for many years and who generally have not had good control of their blood sugar (glucose) level. Diabetic neuropathy is a common complication of diabetes mellitus. There are three common types of diabetic neuropathy and a fourth type that is less common and less understood:  Peripheral neuropathy-This is the most common type of diabetic neuropathy. It causes damage to the nerves of the feet and legs first and then eventually the hands and arms. The damage affects the ability to sense touch.  Autonomic neuropathy-This type causes damage to the autonomic nervous system, which controls the following functions: ? Heartbeat. ? Body temperature. ? Blood pressure. ? Urination. ? Digestion. ? Sweating. ? Sexual function.  Focal neuropathy-Focal neuropathy can be painful and unpredictable and occurs most often in older adults with diabetes mellitus. It involves a specific nerve or one area and often comes on suddenly. It usually does not cause long-term problems.  Radiculoplexus neuropathy- Sometimes called lumbosacral radiculoplexus neuropathy, radiculoplexus neuropathy affects the nerves of the thighs, hips, buttocks, or legs. It is more common in people with type 2 diabetes mellitus and in older men. It is characterized by debilitating pain, weakness, and atrophy, usually in the thigh muscles.  What are the causes? The cause of peripheral, autonomic, and focal neuropathies is diabetes mellitus that is uncontrolled and high glucose levels. The cause of radiculoplexus neuropathy is unknown. However, it is thought to be caused by inflammation related to uncontrolled glucose levels. What are the signs or symptoms? Peripheral Neuropathy Peripheral  neuropathy develops slowly over time. When the nerves of the feet and legs no longer work there may be:  Burning, stabbing, or aching pain in the legs or feet.  Inability to feel pressure or pain in your feet. This can lead to: ? Thick calluses over pressure areas. ? Pressure sores. ? Ulcers.  Foot deformities.  Reduced ability to feel temperature changes.  Muscle weakness.  Autonomic Neuropathy The symptoms of autonomic neuropathy vary depending on which nerves are affected. Symptoms may include:  Problems with digestion, such as: ? Feeling sick to your stomach (nausea). ? Vomiting. ? Bloating. ? Constipation. ? Diarrhea. ? Abdominal pain.  Difficulty with urination. This occurs if you lose your ability to sense when your bladder is full. Problems include: ? Urine leakage (incontinence). ? Inability to empty your bladder completely (retention).  Rapid or irregular heartbeat (palpitations).  Blood pressure drops when you stand up (orthostatic hypotension). When you stand up you may feel: ? Dizzy. ? Weak. ? Faint.  In men, inability to attain and maintain an erection.  In women, vaginal dryness and problems with decreased sexual desire and arousal.  Problems with body temperature regulation.  Increased or decreased sweating.  Focal Neuropathy  Abnormal eye movements or abnormal alignment of both eyes.  Weakness in the wrist.  Foot drop. This results in an inability to lift the foot properly and abnormal walking or foot movement.  Paralysis on one side of your face (Bell palsy).  Chest or abdominal pain. Radiculoplexus Neuropathy  Sudden, severe pain in your hip, thigh, or buttocks.  Weakness and wasting of thigh muscles.  Difficulty rising from a seated position.  Abdominal swelling.  Unexplained weight loss (usually more than 10 lb [4.5 kg]). How is   this diagnosed? Peripheral Neuropathy Your senses may be tested. Sensory function testing can be  done with:  A light touch using a monofilament.  A vibration with tuning fork.  A sharp sensation with a pin prick.  Other tests that can help diagnose neuropathy are:  Nerve conduction velocity. This test checks the transmission of an electrical current through a nerve.  Electromyography. This shows how muscles respond to electrical signals transmitted by nearby nerves.  Quantitative sensory testing. This is used to assess how your nerves respond to vibrations and changes in temperature.  Autonomic Neuropathy Diagnosis is often based on reported symptoms. Tell your health care provider if you experience:  Dizziness.  Constipation.  Diarrhea.  Inappropriate urination or inability to urinate.  Inability to get or maintain an erection.  Tests that may be done include:  Electrocardiography or Holter monitor. These are tests that can help show problems with the heart rate or heart rhythm.  An X-ray exam may be done.  Focal Neuropathy Diagnosis is made based on your symptoms and what your health care provider finds during your exam. Other tests may be done. They may include:  Nerve conduction velocities. This checks the transmission of electrical current through a nerve.  Electromyography. This shows how muscles respond to electrical signals transmitted by nearby nerves.  Quantitative sensory testing. This test is used to assess how your nerves respond to vibration and changes in temperature.  Radiculoplexus Neuropathy  Often the first thing is to eliminate any other issue or problems that might be the cause, as there is no standard test for diagnosis.  X-ray exam of your spine and lumbar region.  Spinal tap to rule out cancer.  MRI to rule out other lesions. How is this treated? Once nerve damage occurs, it cannot be reversed. The goal of treatment is to keep the disease or nerve damage from getting worse and affecting more nerve fibers. Controlling your blood  glucose level is the key. Most people with radiculoplexus neuropathy see at least a partial improvement over time. You will need to keep your blood glucose and HbA1c levels in the target range determined by your health care provider. Things that help control blood glucose levels include:  Blood glucose monitoring.  Meal planning.  Physical activity.  Diabetes medicine.  Over time, maintaining lower blood glucose levels helps lessen symptoms. Sometimes, prescription pain medicine is needed. Follow these instructions at home:  Do not smoke.  Keep your blood glucose level in the range that you and your health care provider have determined acceptable for you.  Keep your blood pressure level in the range that you and your health care provider have determined acceptable for you.  Eat a well-balanced diet.  Be physically active every day. Include strength training and balance exercises.  Protect your feet. ? Check your feet every day for sores, cuts, blisters, or signs of infection. ? Wear padded socks and supportive shoes. Use orthotic inserts, if necessary. ? Regularly check the insides of your shoes for worn spots. Make sure there are no rocks or other items inside your shoes before you put them on. Contact a health care provider if:  You have burning, stabbing, or aching pain in the legs or feet.  You are unable to feel pressure or pain in your feet.  You develop problems with digestion such as: ? Nausea. ? Vomiting. ? Bloating. ? Constipation. ? Diarrhea. ? Abdominal pain.  You have difficulty with urination, such as: ? Incontinence. ? Retention.    You have palpitations.  You develop orthostatic hypotension. When you stand up you may feel: ? Dizzy. ? Weak. ? Faint.  You cannot attain and maintain an erection (in men).  You have vaginal dryness and problems with decreased sexual desire and arousal (in women).  You have severe pain in your thighs, legs, or  buttocks.  You have unexplained weight loss. This information is not intended to replace advice given to you by your health care provider. Make sure you discuss any questions you have with your health care provider. Document Released: 03/07/2001 Document Revised: 06/04/2015 Document Reviewed: 06/07/2012 Elsevier Interactive Patient Education  2017 Elsevier Inc.  

## 2017-05-29 NOTE — Telephone Encounter (Signed)
Call received from [place], [place].  She was with the patient and wanted to confirm what the patient would need to complete his [facility] Card application as soon as possible as he needs to have medications refilled within the next 1-2  Weeks.  He is especially concerned about obtaining the brilinta.  As per Wynonia Hazard, [facility] Financial Counselor, he needs to bring a current ID, letter from [facility] stating that he is homeless, bank account statements from the last 3 months, EBT card.  Clifton James stated that he would meet with the patient at 1330 today to complete the [facility] Card application if the patient is able to get to the clinic. This information was shared with [place] who stated that she would provide transportation to the clinic for the appointment at 1330 and she would assist the patient with obtaining the necessary documentation.  The patient stated that he does not have a bank account.   [place] also reported that the patient's BP this morning was 180/112 and the patient stated that he has been taking his medications. Message sent to Dr Margarita Rana to report the BP.

## 2017-05-30 ENCOUNTER — Encounter: Payer: Self-pay | Admitting: Family Medicine

## 2017-05-30 ENCOUNTER — Encounter: Payer: Self-pay | Admitting: Pediatric Intensive Care

## 2017-05-30 LAB — DRUG SCREEN 12+ALCOHOL+CRT, UR
Amphetamines, Urine: NEGATIVE ng/mL
BARBITURATE: NEGATIVE ng/mL
BENZODIAZ UR QL: NEGATIVE ng/mL
COCAINE (METABOLITE): NEGATIVE ng/mL
Cannabinoids: NEGATIVE ng/mL
Creatinine, Urine: 78.6 mg/dL (ref 20.0–300.0)
ETHANOL, URINE: NEGATIVE %
Meperidine: NEGATIVE ng/mL
Methadone: NEGATIVE ng/mL
OPIATE SCREEN URINE: NEGATIVE ng/mL
Oxycodone/Oxymorphone, Urine: NEGATIVE ng/mL
PROPOXYPHENE: NEGATIVE ng/mL
Phencyclidine: NEGATIVE ng/mL
TRAMADOL: NEGATIVE ng/mL

## 2017-05-30 NOTE — Congregational Nurse Program (Signed)
Congregational Nurse Program Note  Date of Encounter: 05/30/2017  Past Medical History: Past Medical History:  Diagnosis Date  . Anxiety   . 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 Coronary artery disease   . Diabetes mellitus without complication ([place])   . GERD (gastroesophageal reflux disease)   . Headache   . Hx of diabetic neuropathy   . Hypertension   . Myocardial infarction ([place]) 2018  . Renal disorder     Encounter Details: CNP Questionnaire - 05/30/17 1000      Questionnaire   Patient Status  Not Applicable    Race  White or Caucasian    Location Patient Served At  [employer]  Not Applicable    Uninsured  Uninsured (Subsequent visits/quarter)    Food  No food insecurities    Housing/Utilities  No permanent housing    Transportation  Yes, need transportation assistance;Within past 12 months, lack of transportation negatively impacted life    Interpersonal Safety  Yes, feel physically and emotionally safe where you currently live    Medication  Yes, have medication insecurities    Medical Provider  Yes    Referrals  [facility]    ED Visit Averted  Yes    Life-Saving Intervention Made  Not Applicable      Clinical Intake - 05/29/17 1429      Pain   Pain   0-10    Pain Score  6     Pain Type  Chronic pain    Pain Location  Knee    Pain Orientation  Right;Left    Pain Descriptors / Indicators  Aching;Discomfort    Pain Onset  Other (comment) chronic   chronic   Pain Frequency  Several days a week      Nutrition Screen   Nutritional Risks  None    Diabetes  Yes    CBG done?  Yes    CBG resulted in Enter/ Edit results?  Yes    Did pt. bring in CBG monitor from home?  No      Functional Status   Activities of Daily Living  Independent    Ambulation  Independent    Medication Administration  Independent    Home Management  Independent      Risk/Barriers   Barriers to Care Management & Learning  None    Psychosocial Barriers   Uninsured/under-insured      Abuse/Neglect   Do you feel unsafe in your current relationship?  No    Do you feel physically threatened by others?  No    Anyone hurting you at home, work, or school?  No    Unable to ask?  Yes      Investment banker, operational Needed?  No     BP check. Client was seen at [facility] yesterday. Client states that his BG was elevated in office and PCP increased his Lantus to 42 units. States his BG was in 240s this am ut he has difficulty reading the meter. CN assisted completing [employer] application and will take this and copy of EBT info to [employer] at [facility]. Client will follow up in clinic on Friday for BP check.

## 2017-05-31 ENCOUNTER — Telehealth: Payer: Self-pay

## 2017-05-31 NOTE — Telephone Encounter (Signed)
This CM spoke to Wynonia Hazard, [facility] Financial Counselor who confirmed that he received the EBT and [employer]. He did not need anything else from the patient at this time.

## 2017-05-31 NOTE — Telephone Encounter (Signed)
Message received from [facility], [place] noting that she was bringing the patient's [employer] and EBT information to Wynonia Hazard, [place] on 05/30/2017

## 2017-06-02 ENCOUNTER — Encounter: Payer: Self-pay | Admitting: Pediatric Intensive Care

## 2017-06-02 DIAGNOSIS — E118 Type 2 diabetes mellitus with unspecified complications: Secondary | ICD-10-CM

## 2017-06-02 DIAGNOSIS — Z794 Long term (current) use of insulin: Principal | ICD-10-CM

## 2017-06-02 LAB — GLUCOSE, POCT (MANUAL RESULT ENTRY): POC Glucose: 185 mg/dl — AB (ref 70–99)

## 2017-06-06 ENCOUNTER — Other Ambulatory Visit: Payer: Self-pay

## 2017-06-06 ENCOUNTER — Encounter: Payer: Self-pay | Admitting: Pediatric Intensive Care

## 2017-06-06 ENCOUNTER — Encounter: Payer: Self-pay | Admitting: Family Medicine

## 2017-06-06 ENCOUNTER — Ambulatory Visit: Payer: Self-pay | Attending: Family Medicine | Admitting: Family Medicine

## 2017-06-06 VITALS — BP 131/82 | HR 86 | Temp 98.2°F | Wt 217.0 lb

## 2017-06-06 DIAGNOSIS — E1165 Type 2 diabetes mellitus with hyperglycemia: Secondary | ICD-10-CM

## 2017-06-06 DIAGNOSIS — G8929 Other chronic pain: Secondary | ICD-10-CM

## 2017-06-06 DIAGNOSIS — E1169 Type 2 diabetes mellitus with other specified complication: Secondary | ICD-10-CM

## 2017-06-06 DIAGNOSIS — Z888 Allergy status to other drugs, medicaments and biological substances status: Secondary | ICD-10-CM | POA: Insufficient documentation

## 2017-06-06 DIAGNOSIS — I10 Essential (primary) hypertension: Secondary | ICD-10-CM | POA: Insufficient documentation

## 2017-06-06 DIAGNOSIS — Z794 Long term (current) use of insulin: Secondary | ICD-10-CM | POA: Insufficient documentation

## 2017-06-06 DIAGNOSIS — E1140 Type 2 diabetes mellitus with diabetic neuropathy, unspecified: Secondary | ICD-10-CM | POA: Insufficient documentation

## 2017-06-06 DIAGNOSIS — E1149 Type 2 diabetes mellitus with other diabetic neurological complication: Secondary | ICD-10-CM

## 2017-06-06 DIAGNOSIS — I252 Old myocardial infarction: Secondary | ICD-10-CM | POA: Insufficient documentation

## 2017-06-06 DIAGNOSIS — Z79899 Other long term (current) drug therapy: Secondary | ICD-10-CM | POA: Insufficient documentation

## 2017-06-06 DIAGNOSIS — M179 Osteoarthritis of knee, unspecified: Secondary | ICD-10-CM | POA: Insufficient documentation

## 2017-06-06 DIAGNOSIS — K219 Gastro-esophageal reflux disease without esophagitis: Secondary | ICD-10-CM | POA: Insufficient documentation

## 2017-06-06 DIAGNOSIS — Z955 Presence of coronary angioplasty implant and graft: Secondary | ICD-10-CM | POA: Insufficient documentation

## 2017-06-06 DIAGNOSIS — Z880 Allergy status to penicillin: Secondary | ICD-10-CM | POA: Insufficient documentation

## 2017-06-06 DIAGNOSIS — I2510 Atherosclerotic heart disease of native coronary artery without angina pectoris: Secondary | ICD-10-CM | POA: Insufficient documentation

## 2017-06-06 DIAGNOSIS — M542 Cervicalgia: Secondary | ICD-10-CM

## 2017-06-06 DIAGNOSIS — Z7982 Long term (current) use of aspirin: Secondary | ICD-10-CM | POA: Insufficient documentation

## 2017-06-06 DIAGNOSIS — Z7902 Long term (current) use of antithrombotics/antiplatelets: Secondary | ICD-10-CM | POA: Insufficient documentation

## 2017-06-06 DIAGNOSIS — I255 Ischemic cardiomyopathy: Secondary | ICD-10-CM

## 2017-06-06 LAB — GLUCOSE, POCT (MANUAL RESULT ENTRY): POC GLUCOSE: 152 mg/dL — AB (ref 70–99)

## 2017-06-06 MED ORDER — PREGABALIN 100 MG PO CAPS: 100.0000 mg | ORAL_CAPSULE | Freq: Two times a day (BID) | ORAL | 3 refills | Status: DC

## 2017-06-06 MED ORDER — METHOCARBAMOL 500 MG PO TABS: 500.0000 mg | ORAL_TABLET | Freq: Three times a day (TID) | ORAL | 3 refills | Status: DC | PRN

## 2017-06-06 MED ORDER — FUROSEMIDE 20 MG PO TABS: 30.0000 mg | ORAL_TABLET | Freq: Two times a day (BID) | ORAL | 3 refills | Status: DC

## 2017-06-06 MED ORDER — TETANUS-DIPHTH-ACELL PERTUSSIS 5-2.5-18.5 LF-MCG/0.5 IM SUSP: 0.5000 mL | INTRAMUSCULAR | 0 refills | Status: DC

## 2017-06-06 MED ORDER — INSULIN GLARGINE 100 UNIT/ML SOLOSTAR PEN: 42.0000 [IU] | PEN_INJECTOR | Freq: Every day | SUBCUTANEOUS | 3 refills | Status: DC

## 2017-06-06 MED FILL — ?FUROSEMIDE 20 MG TABLET: 20 | 30 days supply | Qty: 90 | Fill #0

## 2017-06-06 MED FILL — METHOCARBAMOL 500 MG TABS: 500 | 30 days supply | Qty: 90 | Fill #0

## 2017-06-06 MED FILL — $LANTUS SOLOSTAR 100 UNITS/: 100 | 35 days supply | Qty: 15 | Fill #0

## 2017-06-06 NOTE — Congregational Nurse Program (Signed)
Congregational Nurse Program Note  Date of Encounter: 06/06/2017  Past Medical History: Past Medical History:  Diagnosis Date  . Anxiety   . 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 Coronary artery disease   . Diabetes mellitus without complication ([place])   . GERD (gastroesophageal reflux disease)   . Headache   . Hx of diabetic neuropathy   . Hypertension   . Myocardial infarction ([place]) 2018  . Renal disorder     Encounter Details: CNP Questionnaire - 06/06/17 0900      Questionnaire   Patient Status  Not Applicable    Race  White or Caucasian    Location Patient Served At  [facility] [employer] [facility]  Not Applicable    Uninsured  Uninsured (Subsequent visits/quarter)    Food  No food insecurities    Housing/Utilities  No permanent housing    Transportation  Yes, need transportation assistance;Within past 12 months, lack of transportation negatively impacted life;Provided transportation assistance (bus pass, taxi voucher, etc.)    Interpersonal Safety  Yes, feel physically and emotionally safe where you currently live    Medication  Yes, have medication insecurities    Medical Provider  Yes    Referrals  Primary Care Provider/Clinic    ED Visit Averted  Not Applicable    Life-Saving Intervention Made  Not Applicable      Clinical Intake - 06/06/17 0953      Pre-visit preparation   Pre-visit preparation completed  Yes      Pain   Pain   0-10    Pain Score  7       Nutrition Screen   Diabetes  Yes    CBG done?  Yes    CBG resulted in Enter/ Edit results?  Yes    Did pt. bring in CBG monitor from home?  No      Functional Status   Activities of Daily Living  Independent    Ambulation  Independent    Medication Administration  Independent    Home Management  Independent      Abuse/Neglect   Do you feel unsafe in your current relationship?  No    Do you feel physically threatened by others?  No    Anyone hurting you at home, work, or school?  No    Unable to ask?  No      Investment banker, operational Needed?  No      BG/BP check. Has appointment at [facility] this morning. Taxi vouchers given. Recheck BG/BP on Friday morning.

## 2017-06-06 NOTE — Congregational Nurse Program (Signed)
Congregational Nurse Program Note  Date of Encounter: 06/02/2017  Past Medical History: Past Medical History:  Diagnosis Date  . Anxiety   . 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 Coronary artery disease   . Diabetes mellitus without complication ([place])   . GERD (gastroesophageal reflux disease)   . Headache   . Hx of diabetic neuropathy   . Hypertension   . Myocardial infarction ([place]) 2018  . Renal disorder     Encounter Details: CNP Questionnaire - 06/06/17 0900      Questionnaire   Patient Status  Not Applicable    Race  White or Caucasian    Location Patient Served At  [facility] [employer] [facility]  Not Applicable    Uninsured  Uninsured (Subsequent visits/quarter)    Food  No food insecurities    Housing/Utilities  No permanent housing    Transportation  Yes, need transportation assistance;Within past 12 months, lack of transportation negatively impacted life;Provided transportation assistance (bus pass, taxi voucher, etc.)    Interpersonal Safety  Yes, feel physically and emotionally safe where you currently live    Medication  Yes, have medication insecurities    Medical Provider  Yes    Referrals  Primary Care Provider/Clinic    ED Visit Averted  Not Applicable    Life-Saving Intervention Made  Not Applicable      Clinical Intake - 06/06/17 0953      Pre-visit preparation   Pre-visit preparation completed  Yes      Pain   Pain   0-10    Pain Score  7       Nutrition Screen   Diabetes  Yes    CBG done?  Yes    CBG resulted in Enter/ Edit results?  Yes    Did pt. bring in CBG monitor from home?  No      Functional Status   Activities of Daily Living  Independent    Ambulation  Independent    Medication Administration  Independent    Home Management  Independent      Abuse/Neglect   Do you feel unsafe in your current relationship?  No    Do you feel physically threatened by others?  No    Anyone hurting you at home, work, or school?  No    Unable to ask?  No      Investment banker, operational Needed?  No     BG/BP check. Client is now on 42 units Lantus at night. States he is taking medication as prescribed. States that his legs and feet are really painful. Client has appointment on Tuesday at [facility]. States he needs bus passes for him and his friend as he can't navigate the bus routes. CN states that she will arrange transportation but that he may not be able to take an attendant.

## 2017-06-06 NOTE — Progress Notes (Signed)
Subjective:  Patient ID: Aaron Pearson, male    DOB: 10/07/1970  Age: 47 y.o. MRN: [id number]  CC: Diabetes and Hypertension   HPI Aaron Pearson is a 47 year old male with a history of type 2 diabetes mellitus (A1c 10.7), hypertension, coronary artery disease (status post multiple prior PCI) with most recent PCI for unstable angina to his left circumflex and LAD on 05/05/17.  Repeat cardiac cath on 05/10/2017 revealed no in stent thrombosis recommendation was for dual antiplatelet therapy with aspirin and Brilinta for 1 year. He presents today for follow-up of his hypertension and diabetes mellitus and his blood pressure is controlled today and he endorses compliance with his medications.  His blood sugar in the clinic is 152 and he informs me it was 115 at home; his Lantus has been increased to 42 at his last office visit and he reports doing well on it and denies episodes of hypoglycemia. However his diabetic neuropathy is uncontrolled despite increasing his dose of gabapentin from 600 mg 3 times daily to 900 mg 3 times daily at his last office visit and he continues to complain of severe pain in his lower extremities. He also complains of neck pain from previous cervical bulging disc he states and was receiving oxycodone while he had insurance and was able to see the pain doctor. He has gained 17 pounds in the last 1 month but denies shortness of breath. Echo 05/05/2017: Study Conclusions  - Left ventricle: The cavity size was normal. Wall thickness was   increased in a pattern of mild LVH. Systolic function was normal.   The estimated ejection fraction was in the range of 50% to 55%.   Wall motion was normal; there were no regional wall motion   abnormalities. Doppler parameters are consistent with a   reversible restrictive pattern, indicative of decreased left   ventricular diastolic compliance and/or increased left atrial   pressure (grade 3 diastolic dysfunction). - Left atrium: The atrium  was moderately dilated.  Past Medical History:  Diagnosis Date  . Anxiety   . 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 Coronary artery disease   . Diabetes mellitus without complication ([place])   . GERD (gastroesophageal reflux disease)   . Headache   . Hx of diabetic neuropathy   . Hypertension   . Myocardial infarction ([place]) 2018  . Renal disorder     Past Surgical History:  Procedure Laterality Date  . CARDIAC CATHETERIZATION  05/05/2017  . CARPAL TUNNEL RELEASE    . CORONARY STENT INTERVENTION N/A 05/05/2017   Procedure: CORONARY STENT INTERVENTION;  Surgeon: Nigel Mormon, MD;  Location: [place] [facility];  Service: Cardiovascular;  Laterality: N/A;  . CORONARY STENT PLACEMENT    . INTRAVASCULAR PRESSURE WIRE/FFR STUDY N/A 05/05/2017   Procedure: INTRAVASCULAR PRESSURE WIRE/FFR STUDY;  Surgeon: Nigel Mormon, MD;  Location: [place] [facility];  Service: Cardiovascular;  Laterality: N/A;  . INTRAVASCULAR PRESSURE WIRE/FFR STUDY N/A 05/10/2017   Procedure: INTRAVASCULAR PRESSURE WIRE/FFR STUDY;  Surgeon: Nigel Mormon, MD;  Location: [place] [facility];  Service: Cardiovascular;  Laterality: N/A;  . LEFT HEART CATH AND CORONARY ANGIOGRAPHY N/A 05/05/2017   Procedure: LEFT HEART CATH AND CORONARY ANGIOGRAPHY;  Surgeon: Nigel Mormon, MD;  Location: [place] [facility];  Service: Cardiovascular;  Laterality: N/A;  . LEFT HEART CATH AND CORONARY ANGIOGRAPHY N/A 05/10/2017   Procedure: LEFT HEART CATH AND CORONARY ANGIOGRAPHY;  Surgeon: Nigel Mormon, MD;  Location: [facility]  INVASIVE [facility] LAB;  Service: Cardiovascular;  Laterality: N/A;    Allergies  Allergen Reactions  . Benadryl [Diphenhydramine] Nausea Only  . Penicillins     Childhood allergy  Has patient had a PCN reaction causing immediate rash, facial/tongue/throat swelling, SOB or lightheadedness with hypotension: Unknown Has patient had a PCN reaction causing severe rash involving mucus membranes or skin necrosis:  Unknown Has patient had a PCN reaction that required hospitalization: Unknown Has patient had a PCN reaction occurring within the last 10 years: No If all of the above answers are "NO", then may proceed with Cephalosporin use.      Outpatient Medications Prior to Visit  Medication Sig Dispense Refill  . acetaminophen (TYLENOL) 325 MG tablet Take 2 tablets (650 mg total) by mouth every 6 (six) hours as needed for moderate pain or headache. 90 tablet 3  . amLODipine (NORVASC) 10 MG tablet Take 1 tablet (10 mg total) by mouth daily. 30 tablet 3  . aspirin EC 81 MG tablet Take 81 mg by mouth daily.    Marland Kitchen atorvastatin (LIPITOR) 80 MG tablet Take 1 tablet (80 mg total) by mouth daily at 6 PM. 30 tablet 3  . Blood Glucose Monitoring Suppl (TRUE METRIX METER) DEVI 1 each by Does not apply route 3 (three) times daily before meals. 1 Device 0  . diclofenac sodium (VOLTAREN) 1 % GEL Apply 4 g topically 4 (four) times daily. 100 g 1  . DULoxetine (CYMBALTA) 60 MG capsule Take 1 capsule (60 mg total) by mouth daily. 30 capsule 3  . gabapentin (NEURONTIN) 300 MG capsule Take 3 capsules (900 mg total) by mouth 3 (three) times daily. 270 capsule 0  . glucose blood (TRUE METRIX BLOOD GLUCOSE TEST) test strip 3 times daily before meals 100 each 12  . insulin aspart (NOVOLOG FLEXPEN) 100 UNIT/ML FlexPen Inject 10 Units into the skin 3 (three) times daily with meals. 30 mL 3  . lidocaine (LIDODERM) 5 % Place 1 patch onto the skin daily. Remove & Discard patch within 12 hours or as directed by MD 30 patch 1  . lisinopril (PRINIVIL,ZESTRIL) 40 MG tablet Take 1 tablet (40 mg total) by mouth every morning. 30 tablet 3  . metoprolol tartrate (LOPRESSOR) 100 MG tablet Take 1 tablet (100 mg total) by mouth 2 (two) times daily. 60 tablet 3  . mupirocin ointment (BACTROBAN) 2 % Place 1 application into the nose 2 (two) times daily. 22 g 0  . nitroGLYCERIN (NITROSTAT) 0.4 MG SL tablet Place 1 tablet (0.4 mg total) under  the tongue See admin instructions. Place 1 tablet (0.'4mg'$ ) under tongue every 5 minutes as needed for chest pain. Call doctor/ 911 if take 2 doses. Max 3/Day 30 tablet 3  . potassium chloride SA (K-DUR,KLOR-CON) 20 MEQ tablet Take 1 tablet (20 mEq total) by mouth daily. 30 tablet 3  . ticagrelor (BRILINTA) 90 MG TABS tablet Take 1 tablet (90 mg total) by mouth 2 (two) times daily. 180 tablet 3  . TRUEPLUS LANCETS 28G MISC 1 each by Does not apply route 3 (three) times daily before meals. 100 each 12  . furosemide (LASIX) 40 MG tablet Take 0.5 tablets (20 mg total) by mouth 2 (two) times daily. 60 tablet 3  . Insulin Glargine (LANTUS SOLOSTAR) 100 UNIT/ML Solostar Pen Inject 35 Units into the skin daily at 10 pm. 5 pen 3  . omeprazole (PRILOSEC) 20 MG capsule Take 1 capsule (20 mg total) by mouth daily for 14 days.  30 capsule 3  . sucralfate (CARAFATE) 1 g tablet Take 1 tablet (1 g total) by mouth 4 (four) times daily -  with meals and at bedtime for 10 days. 40 tablet 0   No facility-administered medications prior to visit.     ROS Review of Systems  Constitutional: Positive for unexpected weight change. Negative for activity change and appetite change.  HENT: Negative for sinus pressure and sore throat.   Eyes: Negative for visual disturbance.  Respiratory: Negative for cough, chest tightness and shortness of breath.   Cardiovascular: Negative for chest pain and leg swelling.  Gastrointestinal: Negative for abdominal distention, abdominal pain, constipation and diarrhea.  Endocrine: Negative.   Genitourinary: Negative for dysuria.  Musculoskeletal:       See hpi  Skin: Negative for rash.  Allergic/Immunologic: Negative.   Neurological: Positive for numbness. Negative for weakness and light-headedness.  Psychiatric/Behavioral: Negative for dysphoric mood and suicidal ideas.    Objective:  BP 131/82   Pulse 86   Temp 98.2 F (36.8 C) (Oral)   Wt 217 lb (98.4 kg)   SpO2 97%   BMI  32.99 kg/m   BP/Weight 06/06/2017 05/30/2017 1/54/0086  Systolic BP 761 950 932  Diastolic BP 82 96 671  Wt. (Lbs) 217 - 209  BMI 32.99 - 31.78      Physical Exam  Constitutional: He is oriented to person, place, and time. He appears well-developed and well-nourished.  Neck: No JVD present.  Cardiovascular: Normal rate, normal heart sounds and intact distal pulses.  No murmur heard. Pulmonary/Chest: Effort normal and breath sounds normal. He has no wheezes. He has no rales. He exhibits no tenderness.  Abdominal: Soft. Bowel sounds are normal. He exhibits no distension and no mass. There is no tenderness.  Musculoskeletal: Normal range of motion.  Neurological: He is alert and oriented to person, place, and time.  Skin: Skin is warm and dry.  Psychiatric: He has a normal mood and affect.     CMP Latest Ref Rng & Units 05/18/2017 05/15/2017 05/14/2017  Glucose 65 - 99 mg/dL 245(H) 332(H) 220(H)  BUN 6 - 20 mg/dL 29(H) 29(H) 31(H)  Creatinine 0.61 - 1.24 mg/dL 1.31(H) 1.98(H) 1.70(H)  Sodium 135 - 145 mmol/L 136 132(L) 140  Potassium 3.5 - 5.1 mmol/L 4.2 4.7 4.2  Chloride 101 - 111 mmol/L 104 98(L) 106  CO2 22 - 32 mmol/L 19(L) 22 23  Calcium 8.9 - 10.3 mg/dL 9.3 9.4 9.5    Lab Results  Component Value Date   HGBA1C 10.7 05/16/2017    Assessment & Plan:   1. Type 2 diabetes mellitus with other specified complication, with long-term current use of insulin (HCC) Uncontrolled with A1c of 10.7 Blood sugar reveal improvement No regimen change todays - POCT glucose (manual entry) - Insulin Glargine (LANTUS SOLOSTAR) 100 UNIT/ML Solostar Pen; Inject 42 Units into the skin daily at 10 pm.  Dispense: 5 pen; Refill: 3 - Basic Metabolic Panel - IWP80+[id number]; Future  2. Other chronic pain Uncontrolled Robaxin added to regimen I have explained to him that pain management referral is an option however given his lack of medical coverage he could have some out-of-pocket expenses.  3.  Other diabetic neurological complication associated with type 2 diabetes mellitus (HCC) Uncontrolled on gabapentin Switch to Lyrica - pregabalin (LYRICA) 100 MG capsule; Take 1 capsule (100 mg total) by mouth 2 (two) times daily.  Dispense: 180 capsule; Refill: 3  4. Neck pain Informs me of previous history of  cervical bulging disks - methocarbamol (ROBAXIN) 500 MG tablet; Take 1 tablet (500 mg total) by mouth every 8 (eight) hours as needed for muscle spasms.  Dispense: 90 tablet; Refill: 3  5. Ischemic cardiomyopathy EF of 50 to 55%, grade 3 DD from echo of 04/2017 Weight gain of 17 pounds in the last 1 month Increased dose of Lasix from 20 mg twice daily to 30 mg twice daily Low-sodium, heart healthy diet, daily weights - furosemide (LASIX) 20 MG tablet; Take 1.5 tablets (30 mg total) by mouth 2 (two) times daily.  Dispense: 90 tablet; Refill: 3   Meds ordered this encounter  Medications  . Insulin Glargine (LANTUS SOLOSTAR) 100 UNIT/ML Solostar Pen    Sig: Inject 42 Units into the skin daily at 10 pm.    Dispense:  5 pen    Refill:  3    Discontinue previous dose  . pregabalin (LYRICA) 100 MG capsule    Sig: Take 1 capsule (100 mg total) by mouth 2 (two) times daily.    Dispense:  180 capsule    Refill:  3    Discontinue gabapentin once Lyrica is obtained through patient assistance  . furosemide (LASIX) 20 MG tablet    Sig: Take 1.5 tablets (30 mg total) by mouth 2 (two) times daily.    Dispense:  90 tablet    Refill:  3    Discontinue previous dose  . methocarbamol (ROBAXIN) 500 MG tablet    Sig: Take 1 tablet (500 mg total) by mouth every 8 (eight) hours as needed for muscle spasms.    Dispense:  90 tablet    Refill:  3    Follow-up: Return in about 3 months (around 09/06/2017) for Follow-up of chronic medical conditions.   Charlott Rakes MD

## 2017-06-06 NOTE — Patient Instructions (Signed)
Diabetic Neuropathy Diabetic neuropathy is a nerve disease or nerve damage that is caused by diabetes mellitus. About half of all people with diabetes mellitus have some form of nerve damage. Nerve damage is more common in those who have had diabetes mellitus for many years and who generally have not had good control of their blood sugar (glucose) level. Diabetic neuropathy is a common complication of diabetes mellitus. There are three common types of diabetic neuropathy and a fourth type that is less common and less understood:  Peripheral neuropathy-This is the most common type of diabetic neuropathy. It causes damage to the nerves of the feet and legs first and then eventually the hands and arms. The damage affects the ability to sense touch.  Autonomic neuropathy-This type causes damage to the autonomic nervous system, which controls the following functions: ? Heartbeat. ? Body temperature. ? Blood pressure. ? Urination. ? Digestion. ? Sweating. ? Sexual function.  Focal neuropathy-Focal neuropathy can be painful and unpredictable and occurs most often in older adults with diabetes mellitus. It involves a specific nerve or one area and often comes on suddenly. It usually does not cause long-term problems.  Radiculoplexus neuropathy- Sometimes called lumbosacral radiculoplexus neuropathy, radiculoplexus neuropathy affects the nerves of the thighs, hips, buttocks, or legs. It is more common in people with type 2 diabetes mellitus and in older men. It is characterized by debilitating pain, weakness, and atrophy, usually in the thigh muscles.  What are the causes? The cause of peripheral, autonomic, and focal neuropathies is diabetes mellitus that is uncontrolled and high glucose levels. The cause of radiculoplexus neuropathy is unknown. However, it is thought to be caused by inflammation related to uncontrolled glucose levels. What are the signs or symptoms? Peripheral Neuropathy Peripheral  neuropathy develops slowly over time. When the nerves of the feet and legs no longer work there may be:  Burning, stabbing, or aching pain in the legs or feet.  Inability to feel pressure or pain in your feet. This can lead to: ? Thick calluses over pressure areas. ? Pressure sores. ? Ulcers.  Foot deformities.  Reduced ability to feel temperature changes.  Muscle weakness.  Autonomic Neuropathy The symptoms of autonomic neuropathy vary depending on which nerves are affected. Symptoms may include:  Problems with digestion, such as: ? Feeling sick to your stomach (nausea). ? Vomiting. ? Bloating. ? Constipation. ? Diarrhea. ? Abdominal pain.  Difficulty with urination. This occurs if you lose your ability to sense when your bladder is full. Problems include: ? Urine leakage (incontinence). ? Inability to empty your bladder completely (retention).  Rapid or irregular heartbeat (palpitations).  Blood pressure drops when you stand up (orthostatic hypotension). When you stand up you may feel: ? Dizzy. ? Weak. ? Faint.  In men, inability to attain and maintain an erection.  In women, vaginal dryness and problems with decreased sexual desire and arousal.  Problems with body temperature regulation.  Increased or decreased sweating.  Focal Neuropathy  Abnormal eye movements or abnormal alignment of both eyes.  Weakness in the wrist.  Foot drop. This results in an inability to lift the foot properly and abnormal walking or foot movement.  Paralysis on one side of your face (Bell palsy).  Chest or abdominal pain. Radiculoplexus Neuropathy  Sudden, severe pain in your hip, thigh, or buttocks.  Weakness and wasting of thigh muscles.  Difficulty rising from a seated position.  Abdominal swelling.  Unexplained weight loss (usually more than 10 lb [4.5 kg]). How is   this diagnosed? Peripheral Neuropathy Your senses may be tested. Sensory function testing can be  done with:  A light touch using a monofilament.  A vibration with tuning fork.  A sharp sensation with a pin prick.  Other tests that can help diagnose neuropathy are:  Nerve conduction velocity. This test checks the transmission of an electrical current through a nerve.  Electromyography. This shows how muscles respond to electrical signals transmitted by nearby nerves.  Quantitative sensory testing. This is used to assess how your nerves respond to vibrations and changes in temperature.  Autonomic Neuropathy Diagnosis is often based on reported symptoms. Tell your health care provider if you experience:  Dizziness.  Constipation.  Diarrhea.  Inappropriate urination or inability to urinate.  Inability to get or maintain an erection.  Tests that may be done include:  Electrocardiography or Holter monitor. These are tests that can help show problems with the heart rate or heart rhythm.  An X-ray exam may be done.  Focal Neuropathy Diagnosis is made based on your symptoms and what your health care provider finds during your exam. Other tests may be done. They may include:  Nerve conduction velocities. This checks the transmission of electrical current through a nerve.  Electromyography. This shows how muscles respond to electrical signals transmitted by nearby nerves.  Quantitative sensory testing. This test is used to assess how your nerves respond to vibration and changes in temperature.  Radiculoplexus Neuropathy  Often the first thing is to eliminate any other issue or problems that might be the cause, as there is no standard test for diagnosis.  X-ray exam of your spine and lumbar region.  Spinal tap to rule out cancer.  MRI to rule out other lesions. How is this treated? Once nerve damage occurs, it cannot be reversed. The goal of treatment is to keep the disease or nerve damage from getting worse and affecting more nerve fibers. Controlling your blood  glucose level is the key. Most people with radiculoplexus neuropathy see at least a partial improvement over time. You will need to keep your blood glucose and HbA1c levels in the target range determined by your health care provider. Things that help control blood glucose levels include:  Blood glucose monitoring.  Meal planning.  Physical activity.  Diabetes medicine.  Over time, maintaining lower blood glucose levels helps lessen symptoms. Sometimes, prescription pain medicine is needed. Follow these instructions at home:  Do not smoke.  Keep your blood glucose level in the range that you and your health care provider have determined acceptable for you.  Keep your blood pressure level in the range that you and your health care provider have determined acceptable for you.  Eat a well-balanced diet.  Be physically active every day. Include strength training and balance exercises.  Protect your feet. ? Check your feet every day for sores, cuts, blisters, or signs of infection. ? Wear padded socks and supportive shoes. Use orthotic inserts, if necessary. ? Regularly check the insides of your shoes for worn spots. Make sure there are no rocks or other items inside your shoes before you put them on. Contact a health care provider if:  You have burning, stabbing, or aching pain in the legs or feet.  You are unable to feel pressure or pain in your feet.  You develop problems with digestion such as: ? Nausea. ? Vomiting. ? Bloating. ? Constipation. ? Diarrhea. ? Abdominal pain.  You have difficulty with urination, such as: ? Incontinence. ? Retention.    You have palpitations.  You develop orthostatic hypotension. When you stand up you may feel: ? Dizzy. ? Weak. ? Faint.  You cannot attain and maintain an erection (in men).  You have vaginal dryness and problems with decreased sexual desire and arousal (in women).  You have severe pain in your thighs, legs, or  buttocks.  You have unexplained weight loss. This information is not intended to replace advice given to you by your health care provider. Make sure you discuss any questions you have with your health care provider. Document Released: 03/07/2001 Document Revised: 06/04/2015 Document Reviewed: 06/07/2012 Elsevier Interactive Patient Education  2017 Elsevier Inc.  

## 2017-06-07 ENCOUNTER — Other Ambulatory Visit: Payer: Self-pay

## 2017-06-07 ENCOUNTER — Observation Stay (HOSPITAL_COMMUNITY)
Admission: EM | Admit: 2017-06-07 | Discharge: 2017-06-09 | Disposition: A | Discharge status: Home / Self Care | Payer: Self-pay | Attending: Family Medicine | Admitting: Family Medicine

## 2017-06-07 ENCOUNTER — Emergency Department (HOSPITAL_COMMUNITY): Payer: Self-pay

## 2017-06-07 DIAGNOSIS — I10 Essential (primary) hypertension: Secondary | ICD-10-CM | POA: Insufficient documentation

## 2017-06-07 DIAGNOSIS — Z8679 Personal history of other diseases of the circulatory system: Secondary | ICD-10-CM | POA: Insufficient documentation

## 2017-06-07 DIAGNOSIS — I2510 Atherosclerotic heart disease of native coronary artery without angina pectoris: Secondary | ICD-10-CM | POA: Insufficient documentation

## 2017-06-07 DIAGNOSIS — Z79899 Other long term (current) drug therapy: Secondary | ICD-10-CM | POA: Insufficient documentation

## 2017-06-07 DIAGNOSIS — Z794 Long term (current) use of insulin: Secondary | ICD-10-CM | POA: Insufficient documentation

## 2017-06-07 DIAGNOSIS — R072 Precordial pain: Principal | ICD-10-CM

## 2017-06-07 DIAGNOSIS — R0789 Other chest pain: Secondary | ICD-10-CM

## 2017-06-07 DIAGNOSIS — I255 Ischemic cardiomyopathy: Secondary | ICD-10-CM

## 2017-06-07 DIAGNOSIS — Z7982 Long term (current) use of aspirin: Secondary | ICD-10-CM | POA: Insufficient documentation

## 2017-06-07 DIAGNOSIS — E1140 Type 2 diabetes mellitus with diabetic neuropathy, unspecified: Secondary | ICD-10-CM | POA: Insufficient documentation

## 2017-06-07 DIAGNOSIS — E118 Type 2 diabetes mellitus with unspecified complications: Secondary | ICD-10-CM

## 2017-06-07 DIAGNOSIS — Z955 Presence of coronary angioplasty implant and graft: Secondary | ICD-10-CM | POA: Insufficient documentation

## 2017-06-07 LAB — COMPREHENSIVE METABOLIC PANEL
ALK PHOS: 71 U/L (ref 38–126)
ALT: 11 U/L — AB (ref 17–63)
AST: 15 U/L (ref 15–41)
Albumin: 3.4 g/dL — ABNORMAL LOW (ref 3.5–5.0)
Anion gap: 8 (ref 5–15)
BILIRUBIN TOTAL: 0.6 mg/dL (ref 0.3–1.2)
BUN: 16 mg/dL (ref 6–20)
CALCIUM: 8.9 mg/dL (ref 8.9–10.3)
CO2: 26 mmol/L (ref 22–32)
CREATININE: 1.49 mg/dL — AB (ref 0.61–1.24)
Chloride: 103 mmol/L (ref 101–111)
GFR calc Af Amer: >60 mL/min (ref >60)
GFR calc non Af Amer: 55 mL/min — ABNORMAL LOW (ref >60)
GLUCOSE: 234 mg/dL — AB (ref 65–99)
Potassium: 4.5 mmol/L (ref 3.5–5.1)
Sodium: 137 mmol/L (ref 135–145)
TOTAL PROTEIN: 7.1 g/dL (ref 6.5–8.1)

## 2017-06-07 LAB — BASIC METABOLIC PANEL
BUN/Creatinine Ratio: 12 (ref 9–20)
BUN: 17 mg/dL (ref 6–24)
CALCIUM: 9.8 mg/dL (ref 8.7–10.2)
CO2: 22 mmol/L (ref 20–29)
CREATININE: 1.46 mg/dL — AB (ref 0.76–1.27)
Chloride: 105 mmol/L (ref 96–106)
GFR, EST AFRICAN AMERICAN: 67 mL/min/{1.73_m2} (ref >59)
GFR, EST NON AFRICAN AMERICAN: 58 mL/min/{1.73_m2} — AB (ref >59)
Glucose: 135 mg/dL — ABNORMAL HIGH (ref 65–99)
Potassium: 5.1 mmol/L (ref 3.5–5.2)
Sodium: 142 mmol/L (ref 134–144)

## 2017-06-07 LAB — CBC WITH DIFFERENTIAL/PLATELET
Abs Immature Granulocytes: 0.1 10*3/uL (ref 0.0–0.1)
BASOS PCT: 0 %
Basophils Absolute: 0.1 10*3/uL (ref 0.0–0.1)
EOS ABS: 0.1 10*3/uL (ref 0.0–0.7)
EOS PCT: 1 %
HCT: 36.7 % — ABNORMAL LOW (ref 39.0–52.0)
HEMOGLOBIN: 11.7 g/dL — AB (ref 13.0–17.0)
Immature Granulocytes: 1 %
LYMPHS PCT: 8 %
Lymphs Abs: 1.6 10*3/uL (ref 0.7–4.0)
MCH: 27.6 pg (ref 26.0–34.0)
MCHC: 31.9 g/dL (ref 30.0–36.0)
MCV: 86.6 fL (ref 78.0–100.0)
MONO ABS: 2 10*3/uL — AB (ref 0.1–1.0)
Monocytes Relative: 10 %
Neutro Abs: 15.9 10*3/uL — ABNORMAL HIGH (ref 1.7–7.7)
Neutrophils Relative %: 80 %
Platelets: 462 10*3/uL — ABNORMAL HIGH (ref 150–400)
RBC: 4.24 MIL/uL (ref 4.22–5.81)
RDW: 15.9 % — AB (ref 11.5–15.5)
WBC: 19.8 10*3/uL — ABNORMAL HIGH (ref 4.0–10.5)

## 2017-06-07 LAB — URINALYSIS, ROUTINE W REFLEX MICROSCOPIC
BILIRUBIN URINE: NEGATIVE
Glucose, UA: 50 mg/dL — AB
HGB URINE DIPSTICK: NEGATIVE
KETONES UR: NEGATIVE mg/dL
Leukocytes, UA: NEGATIVE
Nitrite: NEGATIVE
PROTEIN: NEGATIVE mg/dL
Specific Gravity, Urine: 1.008 (ref 1.005–1.030)
pH: 5 (ref 5.0–8.0)

## 2017-06-07 LAB — TROPONIN I
TROPONIN I: 0.06 ng/mL — AB (ref <0.03)
Troponin I: 0.07 ng/mL (ref <0.03)

## 2017-06-07 LAB — I-STAT CG4 LACTIC ACID, ED
LACTIC ACID, VENOUS: 1.74 mmol/L (ref 0.5–1.9)
Lactic Acid, Venous: 1.25 mmol/L (ref 0.5–1.9)

## 2017-06-07 LAB — GLUCOSE, CAPILLARY: Glucose-Capillary: 213 mg/dL — ABNORMAL HIGH (ref 65–99)

## 2017-06-07 MED ORDER — AZITHROMYCIN 250 MG PO TABS
500.0000 mg | ORAL_TABLET | Freq: Every day | ORAL | Status: AC
  Administered 2017-06-07: 500 mg via ORAL
  Filled 2017-06-07: qty 2

## 2017-06-07 MED ORDER — ENOXAPARIN SODIUM 40 MG/0.4ML ~~LOC~~ SOLN
40.0000 mg | SUBCUTANEOUS | Status: DC
  Administered 2017-06-07 – 2017-06-08 (×2): 40 mg via SUBCUTANEOUS
  Filled 2017-06-07 (×2): qty 0.4

## 2017-06-07 MED ORDER — FUROSEMIDE 40 MG PO TABS
40.0000 mg | ORAL_TABLET | Freq: Two times a day (BID) | ORAL | Status: DC
  Administered 2017-06-07 – 2017-06-09 (×4): 40 mg via ORAL
  Filled 2017-06-07 (×3): qty 1

## 2017-06-07 MED ORDER — SODIUM CHLORIDE 0.9 % IV BOLUS
500.0000 mL | Freq: Once | INTRAVENOUS | Status: AC
  Administered 2017-06-07: 500 mL via INTRAVENOUS

## 2017-06-07 MED ORDER — ATORVASTATIN CALCIUM 80 MG PO TABS
80.0000 mg | ORAL_TABLET | Freq: Every day | ORAL | Status: DC
  Administered 2017-06-07 – 2017-06-08 (×2): 80 mg via ORAL
  Filled 2017-06-07 (×2): qty 1

## 2017-06-07 MED ORDER — GABAPENTIN 300 MG PO CAPS
900.0000 mg | ORAL_CAPSULE | Freq: Three times a day (TID) | ORAL | Status: DC
  Administered 2017-06-07 – 2017-06-09 (×6): 900 mg via ORAL
  Filled 2017-06-07 (×6): qty 3

## 2017-06-07 MED ORDER — ASPIRIN 81 MG PO CHEW: 324.0000 mg | CHEWABLE_TABLET | ORAL | Status: AC

## 2017-06-07 MED ORDER — ACETAMINOPHEN 325 MG PO TABS
650.0000 mg | ORAL_TABLET | ORAL | Status: DC | PRN
  Administered 2017-06-07 – 2017-06-08 (×4): 650 mg via ORAL
  Filled 2017-06-07 (×5): qty 2

## 2017-06-07 MED ORDER — INSULIN GLARGINE 100 UNIT/ML ~~LOC~~ SOLN
42.0000 [IU] | Freq: Every day | SUBCUTANEOUS | Status: DC
  Administered 2017-06-07: 42 [IU] via SUBCUTANEOUS
  Filled 2017-06-07 (×5): qty 0.42

## 2017-06-07 MED ORDER — LISINOPRIL 40 MG PO TABS
40.0000 mg | ORAL_TABLET | Freq: Every morning | ORAL | Status: DC
  Administered 2017-06-08 – 2017-06-09 (×2): 40 mg via ORAL
  Filled 2017-06-07 (×2): qty 1

## 2017-06-07 MED ORDER — SODIUM CHLORIDE 0.9 % IV SOLN: INTRAVENOUS | Status: DC

## 2017-06-07 MED ORDER — NITROGLYCERIN 0.4 MG SL SUBL: 0.4000 mg | SUBLINGUAL_TABLET | SUBLINGUAL | Status: DC

## 2017-06-07 MED ORDER — MORPHINE SULFATE (PF) 4 MG/ML IV SOLN
4.0000 mg | Freq: Once | INTRAVENOUS | Status: AC
  Administered 2017-06-07: 4 mg via INTRAVENOUS
  Filled 2017-06-07: qty 1

## 2017-06-07 MED ORDER — ASPIRIN EC 81 MG PO TBEC
81.0000 mg | DELAYED_RELEASE_TABLET | Freq: Every day | ORAL | Status: DC
  Administered 2017-06-08 – 2017-06-09 (×2): 81 mg via ORAL
  Filled 2017-06-07 (×2): qty 1

## 2017-06-07 MED ORDER — PNEUMOCOCCAL VAC POLYVALENT 25 MCG/0.5ML IJ INJ: 0.5000 mL | INJECTION | Freq: Once | INTRAMUSCULAR | 0 refills | Status: DC

## 2017-06-07 MED ORDER — PANTOPRAZOLE SODIUM 40 MG PO TBEC: 40.0000 mg | DELAYED_RELEASE_TABLET | Freq: Every day | ORAL | Status: DC

## 2017-06-07 MED ORDER — PREGABALIN 25 MG PO CAPS: 100.0000 mg | ORAL_CAPSULE | Freq: Two times a day (BID) | ORAL | Status: DC

## 2017-06-07 MED ORDER — ASPIRIN EC 81 MG PO TBEC: 81.0000 mg | DELAYED_RELEASE_TABLET | Freq: Every day | ORAL | Status: DC

## 2017-06-07 MED ORDER — ASPIRIN 300 MG RE SUPP: 300.0000 mg | RECTAL | Status: AC

## 2017-06-07 MED ORDER — TICAGRELOR 90 MG PO TABS
90.0000 mg | ORAL_TABLET | Freq: Two times a day (BID) | ORAL | Status: DC
  Administered 2017-06-07 – 2017-06-09 (×4): 90 mg via ORAL
  Filled 2017-06-07 (×4): qty 1

## 2017-06-07 MED ORDER — NITROGLYCERIN 0.4 MG SL SUBL
0.4000 mg | SUBLINGUAL_TABLET | SUBLINGUAL | Status: DC | PRN
  Administered 2017-06-07 (×2): 0.4 mg via SUBLINGUAL
  Filled 2017-06-07 (×3): qty 1

## 2017-06-07 MED ORDER — INSULIN GLARGINE 100 UNIT/ML SOLOSTAR PEN: 42.0000 [IU] | PEN_INJECTOR | Freq: Every day | SUBCUTANEOUS | Status: DC

## 2017-06-07 MED ORDER — METHOCARBAMOL 500 MG PO TABS: 500.0000 mg | ORAL_TABLET | Freq: Three times a day (TID) | ORAL | Status: DC | PRN

## 2017-06-07 MED ORDER — ONDANSETRON HCL 4 MG/2ML IJ SOLN
4.0000 mg | Freq: Once | INTRAMUSCULAR | Status: AC
  Administered 2017-06-07: 4 mg via INTRAVENOUS
  Filled 2017-06-07: qty 2

## 2017-06-07 MED ORDER — INSULIN ASPART 100 UNIT/ML ~~LOC~~ SOLN
0.0000 [IU] | Freq: Three times a day (TID) | SUBCUTANEOUS | Status: DC
  Administered 2017-06-08 (×2): 2 [IU] via SUBCUTANEOUS
  Administered 2017-06-09 (×2): 3 [IU] via SUBCUTANEOUS

## 2017-06-07 MED ORDER — INSULIN ASPART 100 UNIT/ML ~~LOC~~ SOLN
0.0000 [IU] | Freq: Every day | SUBCUTANEOUS | Status: DC
  Administered 2017-06-07: 2 [IU] via SUBCUTANEOUS

## 2017-06-07 MED ORDER — ONDANSETRON HCL 4 MG/2ML IJ SOLN
4.0000 mg | Freq: Four times a day (QID) | INTRAMUSCULAR | Status: DC | PRN
  Administered 2017-06-08: 4 mg via INTRAVENOUS
  Filled 2017-06-07: qty 2

## 2017-06-07 MED ORDER — AZITHROMYCIN 250 MG PO TABS
250.0000 mg | ORAL_TABLET | Freq: Every day | ORAL | Status: DC
  Administered 2017-06-08: 250 mg via ORAL
  Filled 2017-06-07: qty 1

## 2017-06-07 MED ORDER — DULOXETINE HCL 60 MG PO CPEP
60.0000 mg | ORAL_CAPSULE | Freq: Every day | ORAL | Status: DC
  Administered 2017-06-08 – 2017-06-09 (×2): 60 mg via ORAL
  Filled 2017-06-07 (×2): qty 1

## 2017-06-07 MED ORDER — MORPHINE SULFATE (PF) 2 MG/ML IV SOLN
1.0000 mg | Freq: Once | INTRAVENOUS | Status: AC
  Administered 2017-06-07: 1 mg via INTRAVENOUS
  Filled 2017-06-07: qty 1

## 2017-06-07 MED ORDER — AMLODIPINE BESYLATE 10 MG PO TABS
10.0000 mg | ORAL_TABLET | Freq: Every day | ORAL | Status: DC
  Filled 2017-06-07: qty 1

## 2017-06-07 MED ORDER — METOPROLOL TARTRATE 100 MG PO TABS
100.0000 mg | ORAL_TABLET | Freq: Two times a day (BID) | ORAL | Status: DC
  Administered 2017-06-07 – 2017-06-09 (×4): 100 mg via ORAL
  Filled 2017-06-07: qty 1
  Filled 2017-06-07: qty 2
  Filled 2017-06-07 (×2): qty 1

## 2017-06-07 MED FILL — $BOOSTRIX VACCINE SYRINGE: 5-2.5-18.5 | 1 days supply | Qty: 1 | Fill #0

## 2017-06-07 MED FILL — $PNEUMOVAX 23 VIAL: 25 | 1 days supply | Qty: 1 | Fill #0

## 2017-06-07 NOTE — ED Provider Notes (Signed)
[facility] EMERGENCY DEPARTMENT Provider Note   CSN: [id number] Arrival date & time: 06/07/17  5277     History   Chief Complaint No chief complaint on file.   HPI Aaron Pearson is a 47 y.o. male.  The history is provided by the patient. No language interpreter was used.    Aaron Pearson is a 47 y.o. male who presents to the Emergency Department complaining of chest pain. He reports severe, central chest pain that began last night. Symptoms were proceeded with nausea and vomiting. Pain is constant nature with associated shortness of breath. He denies any fevers but does feel cold. He has a mild nonproductive cough. He denies any abdominal pain, diarrhea. He does have bilateral lower extremity edema, no tenderness. No prior similar symptoms. He did receive aspirin as well as nitroglycerin by EMS with no significant improvement in his symptoms.  Past Medical History:  Diagnosis Date  . Anxiety   . 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 Coronary artery disease   . Diabetes mellitus without complication ([place])   . GERD (gastroesophageal reflux disease)   . Headache   . Hx of diabetic neuropathy   . Hypertension   . Myocardial infarction ([place]) 2018  . Renal disorder     Patient Active Problem List   Diagnosis Date Noted  . Atypical chest pain 06/07/2017  . Ischemic cardiomyopathy 06/06/2017  . Diabetic neuropathy ([place]) 05/29/2017  . Hypertension 05/16/2017  . Coronary artery disease 05/16/2017  . Anxiety and depression 05/16/2017  . Chronic pain 05/16/2017  . GERD (gastroesophageal reflux disease) 05/16/2017  . Type 2 diabetes mellitus ([place]) 05/16/2017  . Unstable angina ([place]) 05/05/2017  . Chest pain 05/04/2017    Past Surgical History:  Procedure Laterality Date  . CARDIAC CATHETERIZATION  05/05/2017  . CARPAL TUNNEL RELEASE    . CORONARY STENT INTERVENTION N/A 05/05/2017   Procedure: CORONARY STENT INTERVENTION;  Surgeon: Nigel Mormon, MD;  Location:  [facility];  Service: Cardiovascular;  Laterality: N/A;  . CORONARY STENT PLACEMENT    . INTRAVASCULAR PRESSURE WIRE/FFR STUDY N/A 05/05/2017   Procedure: INTRAVASCULAR PRESSURE WIRE/FFR STUDY;  Surgeon: Nigel Mormon, MD;  Location: [facility];  Service: Cardiovascular;  Laterality: N/A;  . INTRAVASCULAR PRESSURE WIRE/FFR STUDY N/A 05/10/2017   Procedure: INTRAVASCULAR PRESSURE WIRE/FFR STUDY;  Surgeon: Nigel Mormon, MD;  Location: [facility];  Service: Cardiovascular;  Laterality: N/A;  . LEFT HEART CATH AND CORONARY ANGIOGRAPHY N/A 05/05/2017   Procedure: LEFT HEART CATH AND CORONARY ANGIOGRAPHY;  Surgeon: Nigel Mormon, MD;  Location: [facility];  Service: Cardiovascular;  Laterality: N/A;  . LEFT HEART CATH AND CORONARY ANGIOGRAPHY N/A 05/10/2017   Procedure: LEFT HEART CATH AND CORONARY ANGIOGRAPHY;  Surgeon: Nigel Mormon, MD;  Location: [facility];  Service: Cardiovascular;  Laterality: N/A;        Home Medications    Prior to Admission medications   Medication Sig Start Date End Date Taking? Authorizing Provider  acetaminophen (TYLENOL) 325 MG tablet Take 2 tablets (650 mg total) by mouth every 6 (six) hours as needed for moderate pain or headache. 05/06/17  Yes Patwardhan, Manish J, MD  amLODipine (NORVASC) 10 MG tablet Take 1 tablet (10 mg total) by mouth daily. 05/16/17  Yes Charlott Rakes, MD  aspirin EC 81 MG tablet Take 81 mg by mouth daily. 04/06/17  Yes [provider]  atorvastatin (LIPITOR) 80 MG  tablet Take 1 tablet (80 mg total) by mouth daily at 6 PM. 05/16/17 05/16/18 Yes Newlin, Charlane Ferretti, MD  diclofenac sodium (VOLTAREN) 1 % GEL Apply 4 g topically 4 (four) times daily. 05/16/17  Yes Charlott Rakes, MD  DULoxetine (CYMBALTA) 60 MG capsule Take 1 capsule (60 mg total) by mouth daily. 05/16/17  Yes Charlott Rakes, MD  furosemide (LASIX) 20 MG tablet Take 1.5 tablets (30 mg total) by mouth 2 (two) times daily.  06/06/17 06/06/18 Yes Charlott Rakes, MD  gabapentin (NEURONTIN) 300 MG capsule Take 3 capsules (900 mg total) by mouth 3 (three) times daily. 05/29/17  Yes Newlin, Enobong, MD  insulin aspart (NOVOLOG FLEXPEN) 100 UNIT/ML FlexPen Inject 10 Units into the skin 3 (three) times daily with meals. 05/16/17  Yes Charlott Rakes, MD  Insulin Glargine (LANTUS SOLOSTAR) 100 UNIT/ML Solostar Pen Inject 42 Units into the skin daily at 10 pm. 06/06/17  Yes Newlin, Enobong, MD  lidocaine (LIDODERM) 5 % Place 1 patch onto the skin daily. Remove & Discard patch within 12 hours or as directed by MD 05/16/17  Yes Charlott Rakes, MD  lisinopril (PRINIVIL,ZESTRIL) 40 MG tablet Take 1 tablet (40 mg total) by mouth every morning. 05/16/17  Yes Charlott Rakes, MD  methocarbamol (ROBAXIN) 500 MG tablet Take 1 tablet (500 mg total) by mouth every 8 (eight) hours as needed for muscle spasms. 06/06/17  Yes Charlott Rakes, MD  metoprolol tartrate (LOPRESSOR) 100 MG tablet Take 1 tablet (100 mg total) by mouth 2 (two) times daily. 05/16/17 06/23/17 Yes Charlott Rakes, MD  mupirocin ointment (BACTROBAN) 2 % Place 1 application into the nose 2 (two) times daily. 05/05/17  Yes Patwardhan, Manish J, MD  nitroGLYCERIN (NITROSTAT) 0.4 MG SL tablet Place 1 tablet (0.4 mg total) under the tongue See admin instructions. Place 1 tablet (0.4mg ) under tongue every 5 minutes as needed for chest pain. Call doctor/ 911 if take 2 doses. Max 3/Day 05/06/17  Yes Patwardhan, Manish J, MD  potassium chloride SA (K-DUR,KLOR-CON) 20 MEQ tablet Take 1 tablet (20 mEq total) by mouth daily. 05/16/17  Yes Charlott Rakes, MD  ticagrelor (BRILINTA) 90 MG TABS tablet Take 1 tablet (90 mg total) by mouth 2 (two) times daily. 05/16/17  Yes Charlott Rakes, MD  Blood Glucose Monitoring Suppl (TRUE METRIX METER) DEVI 1 each by Does not apply route 3 (three) times daily before meals. 05/16/17   Charlott Rakes, MD  glucose blood (TRUE METRIX BLOOD GLUCOSE TEST) test strip 3  times daily before meals 05/16/17   Charlott Rakes, MD  omeprazole (PRILOSEC) 20 MG capsule Take 1 capsule (20 mg total) by mouth daily for 14 days. Patient not taking: Reported on 06/07/2017 05/16/17 05/30/17  Charlott Rakes, MD  pneumococcal 23 valent vaccine (PNEUMOVAX 23) 25 MCG/0.5ML injection Inject 0.5 mLs into the muscle once for 1 dose. 06/07/17 06/07/17  Charlott Rakes, MD  pregabalin (LYRICA) 100 MG capsule Take 1 capsule (100 mg total) by mouth 2 (two) times daily. 06/06/17   Charlott Rakes, MD  sucralfate (CARAFATE) 1 g tablet Take 1 tablet (1 g total) by mouth 4 (four) times daily -  with meals and at bedtime for 10 days. Patient not taking: Reported on 06/07/2017 05/14/17 05/24/17  Duffy Bruce, MD  Tdap [place]) 5-2.5-18.5 LF-MCG/0.5 injection Inject 0.5 mLs into the muscle as directed. Patient not taking: Reported on 06/07/2017 06/06/17   Charlott Rakes, MD  TRUEPLUS LANCETS 28G MISC 1 each by Does not apply route 3 (three) times daily before  meals. 05/16/17   Charlott Rakes, MD    Family History Family History  Problem Relation Age of Onset  . Coronary artery disease Mother   . Coronary artery disease Father     Social History Social History   Tobacco Use  . Smoking status: Never Smoker  . Smokeless tobacco: Never Used  Substance Use Topics  . Alcohol use: Never    Frequency: Never  . Drug use: Never     Allergies   Benadryl [diphenhydramine] and Penicillins   Review of Systems Review of Systems  All other systems reviewed and are negative.    Physical Exam Updated Vital Signs BP 139/82   Pulse 95   Temp (S) (!) 100.9 F (38.3 C) (Rectal)   Resp 18   Ht 5\' 8"  (1.727 m)   Wt 98.4 kg (217 lb)   SpO2 93%   BMI 32.99 kg/m   Physical Exam  Constitutional: He is oriented to person, place, and time. He appears well-developed and well-nourished.  HENT:  Head: Normocephalic and atraumatic.  Cardiovascular: Normal rate and regular rhythm.  No murmur  heard. Pulmonary/Chest: Effort normal and breath sounds normal. No respiratory distress.  Abdominal: Soft. There is no tenderness. There is no rebound and no guarding.  Musculoskeletal: He exhibits no tenderness.  Nonpitting edema to BLE  Neurological: He is alert and oriented to person, place, and time.  Skin: Skin is warm and dry.  Psychiatric: He has a normal mood and affect. His behavior is normal.  Nursing note and vitals reviewed.    ED Treatments / Results  Labs (all labs ordered are listed, but only abnormal results are displayed) Labs Reviewed  COMPREHENSIVE METABOLIC PANEL - Abnormal; Notable for the following components:      Result Value   Glucose, Bld 234 (*)    Creatinine, Ser 1.49 (*)    Albumin 3.4 (*)    ALT 11 (*)    GFR calc non Af Amer 55 (*)    All other components within normal limits  CBC WITH DIFFERENTIAL/PLATELET - Abnormal; Notable for the following components:   WBC 19.8 (*)    Hemoglobin 11.7 (*)    HCT 36.7 (*)    RDW 15.9 (*)    Platelets 462 (*)    Neutro Abs 15.9 (*)    Monocytes Absolute 2.0 (*)    All other components within normal limits  TROPONIN I - Abnormal; Notable for the following components:   Troponin I 0.06 (*)    All other components within normal limits  URINALYSIS, ROUTINE W REFLEX MICROSCOPIC - Abnormal; Notable for the following components:   Color, Urine STRAW (*)    Glucose, UA 50 (*)    All other components within normal limits  CULTURE, BLOOD (ROUTINE X 2)  CULTURE, BLOOD (ROUTINE X 2)  I-STAT CG4 LACTIC ACID, ED  I-STAT CG4 LACTIC ACID, ED    EKG EKG Interpretation  Date/Time:  Wednesday Jun 07 2017 08:52:17 EDT Ventricular Rate:  88 PR Interval:    QRS Duration: 130 QT Interval:  411 QTC Calculation: 498 R Axis:   -172 Text Interpretation:  Sinus rhythm Probable left atrial enlargement RBBB and LPFB Confirmed by Quintella Reichert [phone]) on 06/07/2017 8:56:33 AM   Radiology Dg Chest 2 View  Result Date:  06/07/2017 CLINICAL DATA:  Chest pain EXAM: CHEST - 2 VIEW COMPARISON:  05/18/2017 FINDINGS: Cardiac shadows within normal limits. The lungs are well aerated bilaterally. No focal infiltrate or sizable effusion is  seen. No acute bony abnormality is noted. IMPRESSION: No acute abnormality seen. Electronically Signed   By: Inez Catalina M.D.   On: 06/07/2017 10:10    Procedures Procedures (including critical care time)  Medications Ordered in ED Medications  morphine 4 MG/ML injection 4 mg (4 mg Intravenous Given 06/07/17 0954)  ondansetron (ZOFRAN) injection 4 mg (4 mg Intravenous Given 06/07/17 0954)  sodium chloride 0.9 % bolus 500 mL (0 mLs Intravenous Stopped 06/07/17 1159)  morphine 4 MG/ML injection 4 mg (4 mg Intravenous Given 06/07/17 1156)  morphine 4 MG/ML injection 4 mg (4 mg Intravenous Given 06/07/17 1452)     Initial Impression / Assessment and Plan / ED Course  I have reviewed the triage vital signs and the nursing notes.  Pertinent labs & imaging results that were available during my care of the patient were reviewed by me and considered in my medical decision making (see chart for details).     Patient here for evaluation of chest pain since yesterday. He has a history of diabetes, coronary artery disease. He is noted to have a low-grade fever in the emergency department. EKG with no acute changes compared to priors. CBC would leukocytosis, troponin is mildly elevated. In setting of chest pain question possible pericarditis.  Discussed with patient's primary cardiologist, who recommends medicine admission, he will see the patient and consult. Medicine consulted for admission.  Final Clinical Impressions(s) / ED Diagnoses   Final diagnoses:  None    ED Discharge Orders    None       Quintella Reichert, MD 06/07/17 [phone]

## 2017-06-07 NOTE — ED Notes (Signed)
Heart healthy carb modified ordered on [facility]

## 2017-06-07 NOTE — ED Notes (Signed)
Report attempted and secretary advised the nurse had a pt she was working with rapid response and would call back.

## 2017-06-07 NOTE — ED Notes (Signed)
Dr. Virgina Jock paged to Dr. Ralene Bathe to [phone].   Dr. Bonney Roussel pager: [phone]

## 2017-06-07 NOTE — ED Triage Notes (Signed)
Per [facility]: Pt from [employer], started with chest pain last night, seen recently for pneumonia, given pneumonia shot. Rates pain 10/10, increased pain with respiration and palpation. PT has hx of stents. Given 324 ASA< nitro X 2 with no relief. 20 G L hand. 12 lead shows right bundle with hx. Pt c/o weakness, dizziness, nausea. Pt hx dm CBG 286./  160/110 100 18 95% on room air

## 2017-06-07 NOTE — ED Notes (Signed)
Patient transported to X-ray 

## 2017-06-07 NOTE — Consult Note (Signed)
Reason for Consult: Chest pain Referring Physician: [facility]  Aaron Pearson is an 47 y.o. male.  HPI:   47 year old Caucasian male with coronary artery disease status post multiple prior PCI's since December 2018, uncontrolled type 2 diabetes melitus, hypertension, hyperlipidemia, history of tricuspid valve endocarditis. Patient was recently seen in the office after his PCI. He continues to have resting chest wall pain at baseline. He is not in the ED with fevers that started on 5/28 evening, along with chest pain that is worse with deep breathing, as well as walking. Workup in the ED shows fever 100.9 F, WBC count 19k. EKG shows sinus rhythm with RBBB, without any ischemic changes, or ST elevations. Patient endorses compliance with medications. He lives in [place] homeless [facility] and says there is always someone sick at the shelter, although denies any specific memory of recent sick contact.    Past Medical History:  Diagnosis Date  . Anxiety   . 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 Coronary artery disease   . Diabetes mellitus without complication ([place])   . GERD (gastroesophageal reflux disease)   . Headache   . Hx of diabetic neuropathy   . Hypertension   . Myocardial infarction ([place]) 2018  . Renal disorder     Past Surgical History:  Procedure Laterality Date  . CARDIAC CATHETERIZATION  05/05/2017  . CARPAL TUNNEL RELEASE    . CORONARY STENT INTERVENTION N/A 05/05/2017   Procedure: CORONARY STENT INTERVENTION;  Surgeon: Nigel Mormon, MD;  Location: [facility];  Service: Cardiovascular;  Laterality: N/A;  . CORONARY STENT PLACEMENT    . INTRAVASCULAR PRESSURE WIRE/FFR STUDY N/A 05/05/2017   Procedure: INTRAVASCULAR PRESSURE WIRE/FFR STUDY;  Surgeon: Nigel Mormon, MD;  Location: [facility];  Service: Cardiovascular;  Laterality: N/A;  . INTRAVASCULAR PRESSURE WIRE/FFR STUDY N/A 05/10/2017   Procedure: INTRAVASCULAR PRESSURE WIRE/FFR STUDY;  Surgeon:  Nigel Mormon, MD;  Location: [facility];  Service: Cardiovascular;  Laterality: N/A;  . LEFT HEART CATH AND CORONARY ANGIOGRAPHY N/A 05/05/2017   Procedure: LEFT HEART CATH AND CORONARY ANGIOGRAPHY;  Surgeon: Nigel Mormon, MD;  Location: [facility];  Service: Cardiovascular;  Laterality: N/A;  . LEFT HEART CATH AND CORONARY ANGIOGRAPHY N/A 05/10/2017   Procedure: LEFT HEART CATH AND CORONARY ANGIOGRAPHY;  Surgeon: Nigel Mormon, MD;  Location: [facility];  Service: Cardiovascular;  Laterality: N/A;    Family History  Problem Relation Age of Onset  . Coronary artery disease Mother   . Coronary artery disease Father     Social History:  reports that he has never smoked. He has never used smokeless tobacco. He reports that he does not drink alcohol or use drugs.  Allergies:  Allergies  Allergen Reactions  . Benadryl [Diphenhydramine] Nausea Only  . Penicillins Other (See Comments)    Childhood allergy  Has patient had a PCN reaction causing immediate rash, facial/tongue/throat swelling, SOB or lightheadedness with hypotension: Unknown Has patient had a PCN reaction causing severe rash involving mucus membranes or skin necrosis: Unknown Has patient had a PCN reaction that required hospitalization: Unknown Has patient had a PCN reaction occurring within the last 10 years: No If all of the above answers are "NO", then may proceed with Cephalosporin use.     Medications: I have reviewed the patient's current medications.  Results for orders placed or performed during the hospital encounter of 06/07/17 (from the past 48  hour(s))  Comprehensive metabolic panel     Status: Abnormal   Collection Time: 06/07/17  9:05 AM  Result Value Ref Range   Sodium 137 135 - 145 mmol/L   Potassium 4.5 3.5 - 5.1 mmol/L   Chloride 103 101 - 111 mmol/L   CO2 26 22 - 32 mmol/L   Glucose, Bld 234 (H) 65 - 99 mg/dL   BUN 16 6 - 20 mg/dL   Creatinine, Ser 1.49 (H)  0.61 - 1.24 mg/dL   Calcium 8.9 8.9 - 10.3 mg/dL   Total Protein 7.1 6.5 - 8.1 g/dL   Albumin 3.4 (L) 3.5 - 5.0 g/dL   AST 15 15 - 41 U/L   ALT 11 (L) 17 - 63 U/L   Alkaline Phosphatase 71 38 - 126 U/L   Total Bilirubin 0.6 0.3 - 1.2 mg/dL   GFR calc non Af Amer 55 (L) >60 mL/min   GFR calc Af Amer >60 >60 mL/min    Comment: (NOTE) The eGFR has been calculated using the CKD EPI equation. This calculation has not been validated in all clinical situations. eGFR's persistently <60 mL/min signify possible Chronic Kidney Disease.    Anion gap 8 5 - 15    Comment: Performed at [facility] [address] [place], [place] [address]  CBC with Differential     Status: Abnormal   Collection Time: 06/07/17  9:05 AM  Result Value Ref Range   WBC 19.8 (H) 4.0 - 10.5 K/uL   RBC 4.24 4.22 - 5.81 MIL/uL   Hemoglobin 11.7 (L) 13.0 - 17.0 g/dL   HCT 36.7 (L) 39.0 - 52.0 %   MCV 86.6 78.0 - 100.0 fL   MCH 27.6 26.0 - 34.0 pg   MCHC 31.9 30.0 - 36.0 g/dL   RDW 15.9 (H) 11.5 - 15.5 %   Platelets 462 (H) 150 - 400 K/uL   Neutrophils Relative % 80 %   Neutro Abs 15.9 (H) 1.7 - 7.7 K/uL   Lymphocytes Relative 8 %   Lymphs Abs 1.6 0.7 - 4.0 K/uL   Monocytes Relative 10 %   Monocytes Absolute 2.0 (H) 0.1 - 1.0 K/uL   Eosinophils Relative 1 %   Eosinophils Absolute 0.1 0.0 - 0.7 K/uL   Basophils Relative 0 %   Basophils Absolute 0.1 0.0 - 0.1 K/uL   Immature Granulocytes 1 %   Abs Immature Granulocytes 0.1 0.0 - 0.1 K/uL    Comment: Performed at [facility], [place] [address] [place], [place] [address]  Troponin I     Status: Abnormal   Collection Time: 06/07/17  9:05 AM  Result Value Ref Range   Troponin I 0.06 (HH) <0.03 ng/mL    Comment: CRITICAL RESULT CALLED TO, READ BACK BY AND VERIFIED WITH: J.ALLEN,RN 1106 06/07/17 [place],S Performed at [facility] [address], [place],  [address]   I-Stat CG4 Lactic Acid, ED     Status: None   Collection Time: 06/07/17  10:51 AM  Result Value Ref Range   Lactic Acid, Venous 1.74 0.5 - 1.9 mmol/L  Urinalysis, Routine w reflex microscopic     Status: Abnormal   Collection Time: 06/07/17 10:56 AM  Result Value Ref Range   Color, Urine STRAW (A) YELLOW   APPearance CLEAR CLEAR   Specific Gravity, Urine 1.008 1.005 - 1.030   pH 5.0 5.0 - 8.0   Glucose, UA 50 (A) NEGATIVE mg/dL   Hgb urine dipstick NEGATIVE  NEGATIVE   Bilirubin Urine NEGATIVE NEGATIVE   Ketones, ur NEGATIVE NEGATIVE mg/dL   Protein, ur NEGATIVE NEGATIVE mg/dL   Nitrite NEGATIVE NEGATIVE   Leukocytes, UA NEGATIVE NEGATIVE    Comment: Performed at [facility] [address], [place], [place] [address]    Dg Chest 2 View  Result Date: 06/07/2017 CLINICAL DATA:  Chest pain EXAM: CHEST - 2 VIEW COMPARISON:  05/18/2017 FINDINGS: Cardiac shadows within normal limits. The lungs are well aerated bilaterally. No focal infiltrate or sizable effusion is seen. No acute bony abnormality is noted. IMPRESSION: No acute abnormality seen. Electronically Signed   By: Inez Catalina M.D.   On: 06/07/2017 10:10    EKG 06/07/2017: Sinus rhythm. RBBB and LPFB. No ischemic changes. No ST elevation.  Procedures 05/10/2017:   INTRAVASCULAR PRESSURE WIRE/FFR STUDY  LEFT HEART CATH AND CORONARY ANGIOGRAPHY  Conclusion   LM: Normal LAD: Prior prox-distal LAD stents with 20% prox LAD restenosis. Diagonal with moderate disease LCx: Patent OM2 stent with % restenosis        40% restensosis in small distal LCx OM2 90% stenosis. Successful PCI w/Resolute DES 2.75 X 18 mm RCA: Patent Distal RCA stent with 0% restenosis         RPL with FFR negative (0.91) moderate disease         Small RPDA with severe diffuse disease, best treated medically   Elevated LVEDP 30 mmHg  No stent thrombosis seen No severe restenosis seen  Recommendation: Continue DAPT for at least 1 year Aggressive management of risk factors including type 2 DM. Recommend  diuresis.      Review of Systems  Constitutional: Positive for fever.  HENT: Negative.   Eyes:       Decreased visual acuity at baseline. No new changes.   Respiratory: Positive for cough and shortness of breath.   Cardiovascular: Positive for chest pain.  Gastrointestinal: Positive for nausea and vomiting.  Genitourinary: Negative.   Musculoskeletal: Negative.   Skin: Negative.   Neurological: Negative for loss of consciousness.  Endo/Heme/Allergies: Does not bruise/bleed easily.  Psychiatric/Behavioral: Positive for depression.  All other systems reviewed and are negative.  Blood pressure (!) 142/94, pulse 93, temperature (S) (!) 100.9 F (38.3 C), temperature source Rectal, resp. rate 19, height '5\' 8"'  (1.727 m), weight 98.4 kg (217 lb), SpO2 97 %. Physical Exam  Nursing note and vitals reviewed. Constitutional: He is oriented to person, place, and time. He appears well-developed and well-nourished.  HENT:  Head: Normocephalic and atraumatic.  Eyes: Pupils are equal, round, and reactive to light. Conjunctivae are normal.  Neck: Normal range of motion. Neck supple. No JVD present.  Cardiovascular: Normal rate and regular rhythm. Exam reveals no friction rub.  No murmur heard. Respiratory: Effort normal and breath sounds normal. He has no wheezes. He has no rales.  GI: Soft. Bowel sounds are normal. There is no tenderness. There is no rebound.  Musculoskeletal: He exhibits edema (1-2+ b/l).  Lymphadenopathy:    He has no cervical adenopathy.  Neurological: He is alert and oriented to person, place, and time. No cranial nerve deficit.  Skin: Skin is warm and dry.  Psychiatric: He has a normal mood and affect.    Assessment: 47 y/o Caucasian male Cough, fever, chest pain, leukocytosis: Most likely etiology is walking pneumonia. Dressler's syndrome possible, but less likely in absence of EKG changes s/o pericarditis. His MI few weeks ago was not a large one. It would be  unusual  to see profound myopericarditis changes.  Troponin elevation: Patient has continued to have chest wall pain even after successful PCI. He does have severe CAD with multiple prior stents. Residual severe disease as per last cath is in Rt PDA, which is small and has diffuse disease which is best treated with medical therapy. I do not see any clinical evidence of stent thrombosis. In this event, his troponin elevation is most likely type 2 MI in the setting of infection. CAD s/p multiple prior PCI's: Continue aggressive medical therapy. I do not think he will be best served with more PCI's, unless in the setting of STEMI or type 1 MI. HFpEF Type 2 DM Hypertension  Recommendations: Recommend echocardiogram.  Continue baseline medical therapy, including DAPT (aspirin and Brilinta), and antihypertensive, lipid lowering therapy. (Amlodipine 10 mg, Lisinopril 40 mg, Atorvastatin 80 mg) Recommend diuresis with PO lasix.   Aaron Pearson 06/07/2017, 1:04 PM   [place], MD [facility] Pager: [phone] Office: [phone] If no answer Cell [phone]

## 2017-06-07 NOTE — ED Notes (Signed)
Sent email for dinner order called to confirm.

## 2017-06-07 NOTE — ED Notes (Signed)
Pt left the floor with 6E staff.

## 2017-06-07 NOTE — H&P (Addendum)
[facility] Admission History and Physical Service Pager: [phone]  Patient name: Aaron Pearson Medical record number: [id number] Date of birth: Oct 12, 1970 Age: 47 y.o. Gender: male  Primary Care Provider: Charlott Rakes, MD Consultants: cardiology Code Status: full  Chief Complaint: chest pain  Assessment and Plan: Landan Pearson is a 47 y.o. male presenting with 6 month history of atypical chest pain. Patient with multiple stents in the past without much relief.   Chest Pain  CAD ? Atypical pneumonia Chronic. Patient with known coronary artery disease s/p multiple stents. Has not gotten relief from these interventions in terms of his chest pain. Cardiology seen and evaluated in the ED. Feel that his chest pain is not likely to be cardiac in origin. They recommended echocardiogram, antibiotics, and diuresis. Patient with cough, white count of ~20, and general fatigue. Cardiology with impression that patient has atypical pneumonia. Possible etiologies of this chest pain could include pericarditis, Dressler syndrome, possible chronic musculoskeletal, or could perhaps be related to this history of tricuspid valve endocarditis. - admit to family medicine teaching service, appropriate for telemetry, admit to Dr. Mingo Pearson - vital signs per floor routine - follow up cardiology recommendations - cardiac monitoring - continuous pulse ox - trend troponins - echocardiogram - azithromycin 500mg  on 5/29, 250mg  in subsequent days - tylenol for pain control - morphine for breakthrough pain - trend lactic acid - lasix 40mg  bid - continue atorvastatin 80mg  - continue aspirin 325mg , ticagrelor 90mg  daily - nitrostat prn  Weight gain  BLE swelling Patient claims to have gained 17 pounds in the last month. Had just had lasix increased to 40 daily day prior to admission and has been compliant per his report. Diuresis and echo as above. Echo from 4/26 with EF 50-55%. Did show  grade three diastolic dysfunction. - lasix 40mg  bid - echocardiogram to re-eval - strict I&Os, daily wts  Type II diabetes Mellitus w/ neuropathy a1c of 10.7 from 05/16/2017. Glucose 234 on arrival. Takes lantus 42Units nightly. Will also place on moderate sliding scale. Significant neuropathy from his diabetes. Takes both gabapentin and lyrica for this. Will continue these medications. - gabapentin 300mg  tid  - lyrica 100mg  bid - moderate sliding scale insulin - lantus 42 units nightly  Hypertension Blood pressure 139/82 at most recent check. Takes amlodipine, furosemide, lisinopril, metoprolol. Has taken medications today. - continue amodipine 5mg  daily - start furosemide 40mg  bid as above - continue lisinopril 40mg  daily - continue metoprolol 100mg  bid  GERD Well controlled. Continue prilosec 20mg  daily.  PMH is significant for CAD, T2DM, HTN, GERD, diabetic neuropathy  FEN/GI: heart healthy, carb modified diet Prophylaxis: lovenox  Disposition: likely home  History of Present Illness:  Aaron Pearson is a 47 y.o. male presenting with 6 month history of atypical chest pain. The pain has always been described as a "deep pressure". Patient states that his pain first started in early December. This pain grew gradually worse throughout the month prompting him to go to [facility]. He had a thrombosed distal circumflex artery. The LAD and RCA were found to be heavily stenosed as well and all three vessels were stented. Started on brillinta and aspirin at that time.  Patient re-presented to [facility] on 4/30 with similar chest pain. Was found to have stenosis of LAD and left circumflex and stents placed. Despite all of these interventions he states that he has not gotten much relief from his pain. Patient seen again in early may and found to have elevated  D-Dimer. He had a CTA performed which did not show PE. Patient again returned for similar chest pain on 5/29. 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 Per patient report he had cellulitis of his finger which eventually needed multiple weeks of IV antibiotics. Apparently his PICC line may have gotten infected causing this problem.  Workup in the ED consisted of a cmp, troponin, lactic acid, cbc, UA. Workup significant for cr 1.49, troponin 0.06, wbc 19.8, hgb 11.7, platelet 462. Ua with 50 glucose. Treatment consisted of morphine x2, zofran, and a 57mL bolus. Patient was evaluated by cardiology who did not feel this chest pain was likely to be cardiac in origin. They recommended diuresis, antibiotics, and an echocardiogram.  Review Of Systems: Per HPI with the following additions:   Review of Systems  Constitutional: Negative for chills and fever.  HENT: Negative for congestion and sore throat.   Eyes: Negative for pain, discharge and redness.  Respiratory: Negative for cough, hemoptysis and shortness of breath.   Cardiovascular: Positive for chest pain. Negative for palpitations.  Gastrointestinal: Positive for nausea and vomiting. Negative for constipation and diarrhea.  Genitourinary: Negative for dysuria and urgency.  Musculoskeletal: Negative for myalgias and neck pain.  Skin: Negative for itching and rash.  Neurological: Negative for dizziness, weakness and headaches.  Psychiatric/Behavioral: Negative for depression and substance abuse.    Patient Active Problem List   Diagnosis Date Noted  . Atypical chest pain 06/07/2017  . Ischemic cardiomyopathy 06/06/2017  . Diabetic neuropathy ([place]) 05/29/2017  . Hypertension 05/16/2017  . Coronary artery disease 05/16/2017  . Anxiety and depression 05/16/2017  . Chronic pain 05/16/2017  . GERD (gastroesophageal reflux disease) 05/16/2017  . Type 2 diabetes mellitus ([place]) 05/16/2017  . Unstable angina ([place]) 05/05/2017  . Chest pain 05/04/2017    Past Medical History: Past Medical History:  Diagnosis Date  . Anxiety   . 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 Coronary  artery disease   . Diabetes mellitus without complication ([place])   . GERD (gastroesophageal reflux disease)   . Headache   . Hx of diabetic neuropathy   . Hypertension   . Myocardial infarction ([place]) 2018  . Renal disorder     Past Surgical History: Past Surgical History:  Procedure Laterality Date  . CARDIAC CATHETERIZATION  05/05/2017  . CARPAL TUNNEL RELEASE    . CORONARY STENT INTERVENTION N/A 05/05/2017   Procedure: CORONARY STENT INTERVENTION;  Surgeon: Nigel Mormon, MD;  Location: [facility];  Service: Cardiovascular;  Laterality: N/A;  . CORONARY STENT PLACEMENT    . INTRAVASCULAR PRESSURE WIRE/FFR STUDY N/A 05/05/2017   Procedure: INTRAVASCULAR PRESSURE WIRE/FFR STUDY;  Surgeon: Nigel Mormon, MD;  Location: [facility];  Service: Cardiovascular;  Laterality: N/A;  . INTRAVASCULAR PRESSURE WIRE/FFR STUDY N/A 05/10/2017   Procedure: INTRAVASCULAR PRESSURE WIRE/FFR STUDY;  Surgeon: Nigel Mormon, MD;  Location: [facility];  Service: Cardiovascular;  Laterality: N/A;  . LEFT HEART CATH AND CORONARY ANGIOGRAPHY N/A 05/05/2017   Procedure: LEFT HEART CATH AND CORONARY ANGIOGRAPHY;  Surgeon: Nigel Mormon, MD;  Location: [facility];  Service: Cardiovascular;  Laterality: N/A;  . LEFT HEART CATH AND CORONARY ANGIOGRAPHY N/A 05/10/2017   Procedure: LEFT HEART CATH AND CORONARY ANGIOGRAPHY;  Surgeon: Nigel Mormon, MD;  Location: [facility];  Service: Cardiovascular;  Laterality: N/A;    Social History: Social History   Tobacco Use  . Smoking  status: Never Smoker  . Smokeless tobacco: Never Used  Substance Use Topics  . Alcohol use: Never    Frequency: Never  . Drug use: Never   Additional social history:  Please also refer to relevant sections of EMR.  Family History: Family History  Problem Relation Age of Onset  . Coronary artery disease Mother   . Coronary artery disease Father     Allergies and  Medications: Allergies  Allergen Reactions  . Benadryl [Diphenhydramine] Nausea Only  . Penicillins Other (See Comments)    Childhood allergy  Has patient had a PCN reaction causing immediate rash, facial/tongue/throat swelling, SOB or lightheadedness with hypotension: Unknown Has patient had a PCN reaction causing severe rash involving mucus membranes or skin necrosis: Unknown Has patient had a PCN reaction that required hospitalization: Unknown Has patient had a PCN reaction occurring within the last 10 years: No If all of the above answers are "NO", then may proceed with Cephalosporin use.    No current facility-administered medications on file prior to encounter.    Current Outpatient Medications on File Prior to Encounter  Medication Sig Dispense Refill  . acetaminophen (TYLENOL) 325 MG tablet Take 2 tablets (650 mg total) by mouth every 6 (six) hours as needed for moderate pain or headache. 90 tablet 3  . amLODipine (NORVASC) 10 MG tablet Take 1 tablet (10 mg total) by mouth daily. 30 tablet 3  . aspirin EC 81 MG tablet Take 81 mg by mouth daily.    Marland Kitchen atorvastatin (LIPITOR) 80 MG tablet Take 1 tablet (80 mg total) by mouth daily at 6 PM. 30 tablet 3  . diclofenac sodium (VOLTAREN) 1 % GEL Apply 4 g topically 4 (four) times daily. 100 g 1  . DULoxetine (CYMBALTA) 60 MG capsule Take 1 capsule (60 mg total) by mouth daily. 30 capsule 3  . furosemide (LASIX) 20 MG tablet Take 1.5 tablets (30 mg total) by mouth 2 (two) times daily. 90 tablet 3  . gabapentin (NEURONTIN) 300 MG capsule Take 3 capsules (900 mg total) by mouth 3 (three) times daily. 270 capsule 0  . insulin aspart (NOVOLOG FLEXPEN) 100 UNIT/ML FlexPen Inject 10 Units into the skin 3 (three) times daily with meals. 30 mL 3  . Insulin Glargine (LANTUS SOLOSTAR) 100 UNIT/ML Solostar Pen Inject 42 Units into the skin daily at 10 pm. 5 pen 3  . lidocaine (LIDODERM) 5 % Place 1 patch onto the skin daily. Remove & Discard patch  within 12 hours or as directed by MD 30 patch 1  . lisinopril (PRINIVIL,ZESTRIL) 40 MG tablet Take 1 tablet (40 mg total) by mouth every morning. 30 tablet 3  . methocarbamol (ROBAXIN) 500 MG tablet Take 1 tablet (500 mg total) by mouth every 8 (eight) hours as needed for muscle spasms. 90 tablet 3  . metoprolol tartrate (LOPRESSOR) 100 MG tablet Take 1 tablet (100 mg total) by mouth 2 (two) times daily. 60 tablet 3  . mupirocin ointment (BACTROBAN) 2 % Place 1 application into the nose 2 (two) times daily. 22 g 0  . nitroGLYCERIN (NITROSTAT) 0.4 MG SL tablet Place 1 tablet (0.4 mg total) under the tongue See admin instructions. Place 1 tablet (0.4mg ) under tongue every 5 minutes as needed for chest pain. Call doctor/ 911 if take 2 doses. Max 3/Day 30 tablet 3  . potassium chloride SA (K-DUR,KLOR-CON) 20 MEQ tablet Take 1 tablet (20 mEq total) by mouth daily. 30 tablet 3  . ticagrelor (BRILINTA) 90 MG  TABS tablet Take 1 tablet (90 mg total) by mouth 2 (two) times daily. 180 tablet 3  . Blood Glucose Monitoring Suppl (TRUE METRIX METER) DEVI 1 each by Does not apply route 3 (three) times daily before meals. 1 Device 0  . glucose blood (TRUE METRIX BLOOD GLUCOSE TEST) test strip 3 times daily before meals 100 each 12  . omeprazole (PRILOSEC) 20 MG capsule Take 1 capsule (20 mg total) by mouth daily for 14 days. (Patient not taking: Reported on 06/07/2017) 30 capsule 3  . pregabalin (LYRICA) 100 MG capsule Take 1 capsule (100 mg total) by mouth 2 (two) times daily. 180 capsule 3  . sucralfate (CARAFATE) 1 g tablet Take 1 tablet (1 g total) by mouth 4 (four) times daily -  with meals and at bedtime for 10 days. (Patient not taking: Reported on 06/07/2017) 40 tablet 0  . Tdap (BOOSTRIX) 5-2.5-18.5 LF-MCG/0.5 injection Inject 0.5 mLs into the muscle as directed. (Patient not taking: Reported on 06/07/2017) 0.5 mL 0  . TRUEPLUS LANCETS 28G MISC 1 each by Does not apply route 3 (three) times daily before meals.  100 each 12    Objective: BP 139/82   Pulse 95   Temp (S) (!) 100.9 F (38.3 C) (Rectal)   Resp 18   Ht 5\' 8"  (1.727 m)   Wt 217 lb (98.4 kg)   SpO2 93%   BMI 32.99 kg/m  Exam: General: alert, oriented, no acute distress. Middle aged 64 male. Eyes: eomi, perrl ENTM: no signs of trauma external ears or nose. Neck: range of motion intact Cardiovascular: regular rate and rhythm, no m/r/g. Palpable pt/dp bilaterally Respiratory: lungs clear to ausculation bilaterally,  Gastrointestinal: soft, non-tender, non-distended MSK: 5/5 strength BUE, BLE.  Extremities: 1+ edema BLE Derm: warm, dry Neuro: alert, oriented x3. No focal neuro deficits Psych: appropriate  Labs and Imaging: CBC BMET  Recent Labs  Lab 06/07/17 0905  WBC 19.8*  HGB 11.7*  HCT 36.7*  PLT 462*   Recent Labs  Lab 06/07/17 0905  NA 137  K 4.5  CL 103  CO2 26  BUN 16  CREATININE 1.49*  GLUCOSE 234*  CALCIUM 8.9      Guadalupe Dawn, MD 06/07/2017, 4:21 PM PGY-1, [facility] Intern pager: [phone], text pages welcome  Upper Level Addendum: I have seen and evaluated this patient along with Dr. Kris Mouton and reviewed the above note, making necessary revisions in blue.  Harriet Butte, [place], PGY-2

## 2017-06-08 ENCOUNTER — Observation Stay (HOSPITAL_COMMUNITY): Payer: Self-pay

## 2017-06-08 ENCOUNTER — Encounter (HOSPITAL_COMMUNITY): Payer: Self-pay

## 2017-06-08 DIAGNOSIS — E1140 Type 2 diabetes mellitus with diabetic neuropathy, unspecified: Secondary | ICD-10-CM

## 2017-06-08 DIAGNOSIS — R072 Precordial pain: Secondary | ICD-10-CM

## 2017-06-08 LAB — CBC WITH DIFFERENTIAL/PLATELET
ABS IMMATURE GRANULOCYTES: 0.1 10*3/uL (ref 0.0–0.1)
BASOS ABS: 0.1 10*3/uL (ref 0.0–0.1)
Basophils Relative: 0 %
Eosinophils Absolute: 0.1 10*3/uL (ref 0.0–0.7)
Eosinophils Relative: 1 %
HCT: 32.8 % — ABNORMAL LOW (ref 39.0–52.0)
HEMOGLOBIN: 10.5 g/dL — AB (ref 13.0–17.0)
IMMATURE GRANULOCYTES: 0 %
LYMPHS PCT: 16 %
Lymphs Abs: 3 10*3/uL (ref 0.7–4.0)
MCH: 27.5 pg (ref 26.0–34.0)
MCHC: 32 g/dL (ref 30.0–36.0)
MCV: 85.9 fL (ref 78.0–100.0)
MONO ABS: 2.8 10*3/uL — AB (ref 0.1–1.0)
Monocytes Relative: 14 %
NEUTROS ABS: 13.2 10*3/uL — AB (ref 1.7–7.7)
Neutrophils Relative %: 69 %
Platelets: 362 10*3/uL (ref 150–400)
RBC: 3.82 MIL/uL — AB (ref 4.22–5.81)
RDW: 15.7 % — ABNORMAL HIGH (ref 11.5–15.5)
WBC: 19.2 10*3/uL — AB (ref 4.0–10.5)

## 2017-06-08 LAB — BASIC METABOLIC PANEL
Anion gap: 8 (ref 5–15)
BUN: 22 mg/dL — ABNORMAL HIGH (ref 6–20)
CHLORIDE: 102 mmol/L (ref 101–111)
CO2: 25 mmol/L (ref 22–32)
CREATININE: 1.61 mg/dL — AB (ref 0.61–1.24)
Calcium: 8.3 mg/dL — ABNORMAL LOW (ref 8.9–10.3)
GFR calc non Af Amer: 51 mL/min — ABNORMAL LOW (ref >60)
GFR, EST AFRICAN AMERICAN: 59 mL/min — AB (ref >60)
Glucose, Bld: 115 mg/dL — ABNORMAL HIGH (ref 65–99)
Potassium: 4 mmol/L (ref 3.5–5.1)
Sodium: 135 mmol/L (ref 135–145)

## 2017-06-08 LAB — TROPONIN I
TROPONIN I: 0.06 ng/mL — AB (ref <0.03)
Troponin I: 0.07 ng/mL (ref <0.03)
Troponin I: 0.05 ng/mL (ref <0.03)
Troponin I: 0.05 ng/mL (ref <0.03)

## 2017-06-08 LAB — GLUCOSE, CAPILLARY
GLUCOSE-CAPILLARY: 129 mg/dL — AB (ref 65–99)
GLUCOSE-CAPILLARY: 124 mg/dL — AB (ref 65–99)
GLUCOSE-CAPILLARY: 113 mg/dL — AB (ref 65–99)
Glucose-Capillary: 96 mg/dL (ref 65–99)
Glucose-Capillary: 98 mg/dL (ref 65–99)

## 2017-06-08 LAB — ECHOCARDIOGRAM COMPLETE
Height: 68 in
WEIGHTICAEL: 3448 [oz_av]

## 2017-06-08 LAB — MRSA PCR SCREENING: MRSA by PCR: NEGATIVE

## 2017-06-08 MED ORDER — LIDOCAINE 5 % EX PTCH
1.0000 | MEDICATED_PATCH | Freq: Every day | CUTANEOUS | Status: DC | PRN
  Administered 2017-06-08: 1 via TRANSDERMAL
  Filled 2017-06-08: qty 1

## 2017-06-08 MED ORDER — SPIRONOLACTONE 25 MG PO TABS
50.0000 mg | ORAL_TABLET | Freq: Every day | ORAL | Status: DC
  Administered 2017-06-08 – 2017-06-09 (×2): 50 mg via ORAL
  Filled 2017-06-08 (×2): qty 2

## 2017-06-08 MED ORDER — PERFLUTREN LIPID MICROSPHERE
1.0000 mL | INTRAVENOUS | Status: AC | PRN
  Administered 2017-06-08: 3 mL via INTRAVENOUS
  Filled 2017-06-08: qty 10

## 2017-06-08 MED ORDER — METOCLOPRAMIDE HCL 5 MG PO TABS
5.0000 mg | ORAL_TABLET | Freq: Three times a day (TID) | ORAL | Status: DC | PRN
  Administered 2017-06-08: 5 mg via ORAL
  Filled 2017-06-08: qty 1

## 2017-06-08 MED ORDER — BISMUTH SUBSALICYLATE 262 MG PO CHEW
524.0000 mg | CHEWABLE_TABLET | ORAL | Status: DC | PRN
  Administered 2017-06-08: 524 mg via ORAL
  Filled 2017-06-08: qty 2

## 2017-06-08 NOTE — Progress Notes (Signed)
Dr Mingo Amber wanted pt to try nitro sub Ling for chest pain, pt refusing, states only morphine relieves pain and nitro gives headache.  Edward Qualia RN

## 2017-06-08 NOTE — Progress Notes (Signed)
  Echocardiogram 2D Echocardiogram has been performed.  Aaron Pearson Aaron Pearson Aaron Pearson 06/08/2017, 3:56 PM

## 2017-06-08 NOTE — Progress Notes (Addendum)
FMTS Attending Daily Note:  S:  SLeeping comfortably.  C/o chest pain when awakened.  No further dyspnea  O: Gen:  Alert, cooperative patient who appears stated age in no acute distress.  Vital signs reviewed. Cardiac:  Regular rate and rhythm without murmur auscultated.  Good S1/S2. Pulm:  Clear to auscultation bilaterally with good air movement.  No wheezes or rales noted.   Abd:  Soft/nondistended/nontender.  Good bowel sounds throughout all four quadrants.  No masses noted.  Ext:  No LE edema  Imp/Plan: 1. Chest pain:   - noncardiac.  Troponins at baseline - appreciate cardiology input  - awaiting Echo -APAP for pain relief.  Concern for narcotic seeking behavior. If true chest pain/unremitting pain, attempt NTG  2.  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  Obtain blood cx's  Rest of chronic issues per resident note.   Aaron Reasons, MD 06/08/2017 1:22 PM

## 2017-06-08 NOTE — Progress Notes (Signed)
Pt complained he still feels nauseous and is now having diarrhea paged on call provider

## 2017-06-08 NOTE — Progress Notes (Signed)
Subjective: Intermittent chest pain, only relieved with NTG Asking for more morphine  Objective: Vital signs in last 24 hours: Temp:  [98.7 F (37.1 C)-100.7 F (38.2 C)] 99.6 F (37.6 C) (05/30 0751) Pulse Rate:  [83-97] 89 (05/30 0751) Resp:  [5-25] 5 (05/30 0530) BP: (100-155)/(61-107) 133/86 (05/30 0751) SpO2:  [90 %-99 %] 95 % (05/30 0751) Weight:  [215 lb 8 oz (97.8 kg)-217 lb (98.4 kg)] 215 lb 8 oz (97.8 kg) (05/29 2137) Weight change:  Last BM Date: 06/07/17  Intake/Output from previous day: 05/29 0701 - 05/30 0700 In: -  Out: 250 [Urine:250] Intake/Output this shift: Total I/O In: -  Out: 550 [Urine:550]  Physical Exam: Physical Exam  Nursing note and vitals reviewed. Constitutional: He is oriented to person, place, and time. He appears well-developed and well-nourished.  HENT:  Head: Normocephalic and atraumatic.  Eyes: Pupils are equal, round, and reactive to light. Conjunctivae are normal.  Neck: Normal range of motion. Neck supple. No JVD present.  Cardiovascular: Normal rate and regular rhythm. Exam reveals no friction rub.  No murmur heard. Respiratory: Effort normal and breath sounds normal. He has no wheezes. He has no rales.  GI: Soft. Bowel sounds are normal. There is no tenderness. There is no rebound.  Musculoskeletal: He exhibits edema (1-2+ b/l).  Lymphadenopathy:    He has no cervical adenopathy.  Neurological: He is alert and oriented to person, place, and time. No cranial nerve deficit.  Skin: Skin is warm and dry.  Psychiatric: He has a normal mood and affect.    Lab Results: Recent Labs    06/07/17 0905 06/08/17 0630  WBC 19.8* 19.2*  HGB 11.7* 10.5*  HCT 36.7* 32.8*  PLT 462* 362   BMET Recent Labs    06/07/17 0905 06/08/17 0630  NA 137 135  K 4.5 4.0  CL 103 102  CO2 26 25  GLUCOSE 234* 115*  BUN 16 22*  CREATININE 1.49* 1.61*  CALCIUM 8.9 8.3*    Studies/Results: Dg Chest 2 View  Result Date: 06/07/2017 CLINICAL  DATA:  Chest pain EXAM: CHEST - 2 VIEW COMPARISON:  05/18/2017 FINDINGS: Cardiac shadows within normal limits. The lungs are well aerated bilaterally. No focal infiltrate or sizable effusion is seen. No acute bony abnormality is noted. IMPRESSION: No acute abnormality seen. Electronically Signed   By: Inez Catalina M.D.   On: 06/07/2017 10:10    Medications: I have reviewed the patient's current medications.   Assessment: 47 y/o Caucasian male Possible CAP Troponin elevation: Flat trend with no rise and fall. Type 2 MI in the setting of infection and supply demand mismatch.  CAD s/p multiple prior PCI's:  HFpEF Type 2 DM Hypertension CKD 3  Recommendations: No indication for PCI. He will be best treated with medical management for his residual severe disease in small Rt PDA.  Continue baseline medical therapy, including DAPT (aspirin and Brilinta), and antihypertensive, lipid lowering therapy. (Lisinopril 40 mg, Atorvastatin 80 mg, lasix 40 mg PO bid) Echocardiogram pending Switch amlodipine 10 mg to spironolactone 50 mg daily for better diuresis and management of HFpEF   Will follow up outpatient after discharge. Please call back if any questions.   LOS: 0 days   Curtisha Bendix J Franke Menter 06/08/2017, 10:39 AM   [place], MD [facility] Pager: [phone] Office: [phone] If no answer Cell [phone]

## 2017-06-08 NOTE — Progress Notes (Signed)
Pt had episode of N/V, while laying in bed resting, prn zofran given, EKG nsr with BBB, Dr Ashok Pall notified.  Edward Qualia RN

## 2017-06-08 NOTE — Clinical Social Work Note (Signed)
Clinical Social Work Assessment  Patient Details  Name: Aaron Pearson MRN: [id number] Date of Birth: 07/03/1970  Date of referral:  06/08/17               Reason for consult:  Housing Concerns/Homelessness                Permission sought to share information with:    Permission granted to share information::     Name::        Agency::     Relationship::     Contact Information:     Housing/Transportation Living arrangements for the past 2 months:  [employer]'s clerk of Information:  Patient Patient Interpreter Needed:  None Criminal Activity/Legal Involvement Pertinent to Current Situation/Hospitalization:  No - Comment as needed Significant Relationships:  Adult Children Lives with:  Self Do you feel safe going back to the place where you live?  Yes Need for family participation in patient care:  No (Coment)  Care giving concerns: CSW consulted for patient homelessness. Patient was seen by this CSW on 05/11/17 during previous admission.   Social Worker assessment / plan: CSW met with patient at bedside. Patient alert and oriented. Patient reported he has continued to stay at [facility] and plans to return there at discharge. CSW advised patient bring his AVS to shelter as proof of hospital stay. Patient is aware of [employer] and services they offer.  Patient also requires assistance with transportation - CSW to provide bus pass at discharge. CSW signing off as no additional needs at this time.  Employment status:  Unemployed Forensic scientist:  Self Pay (Medicaid Pending) PT Recommendations:  Not assessed at this time Information / Referral to community resources:  Shelter  Patient/Family's Response to care: Patient appreciative of resources.  Patient/Family's Understanding of and Emotional Response to Diagnosis, Current Treatment, and Prognosis: Patient with understanding of his conditions and planning to return to shelter.  Emotional  Assessment Appearance:  Appears stated age Attitude/Demeanor/Rapport:  Engaged Affect (typically observed):  Appropriate, Calm Orientation:  Oriented to Self, Oriented to Place, Oriented to  Time, Oriented to Situation Alcohol / Substance use:  Not Applicable Psych involvement (Current and /or in the community):  No (Comment)  Discharge Needs  Concerns to be addressed:  Homelessness Readmission within the last 30 days:  Yes Current discharge risk:    Barriers to Discharge:  No Barriers Identified   Aaron Emms, LCSW 06/08/2017, 10:57 AM

## 2017-06-08 NOTE — Progress Notes (Signed)
Patient complaining chest pain non radiating 8/10 asking for morphine.Patient refusing sl nitroglycerin 12 lead ekg with no changes.Call placed to [place] [facility] .New orders received and carried out.

## 2017-06-08 NOTE — Progress Notes (Signed)
Family Medicine Teaching Service Daily Progress Note Intern Pager: [phone]  Patient name: Aaron Pearson Medical record number: [id number] Date of birth: 1970-02-18 Age: 47 y.o. Gender: male  Primary Care Provider: Charlott Rakes, MD Consultants: cardiology Code Status: full  Pt Overview and Major Events to Date:  5/29 admitted to [facility]  Assessment and Plan: Aaron Pearson is a 47 y.o. male presenting with 6 month history of atypical chest pain. Patient with multiple stents in the past without much relief.   Chest Pain  CAD ? Atypical pneumonia Known Coronary artery disease s/p multiple stents. Some relief from nitro and morphine. Do not feel that morphine is a good treatment for chronic chest pain. No more narcotics during admission and will only give nitro and tylenol. Some improvement in respiratory symptoms on abx. Still awaiting echo. WB still elevated at 19 from 20. Leading etiology is pericarditis vs atypical pneumonia vs chest pain from severe stenosis in R PDA. Appreciate cardiology recs. Also has remote history of tricuspid valve endocarditis. - vital signs per floor routine - Cards following- appreciate their recs - cardiac monitoring - continuous pulse ox - echocardiogram - azithromycin 5/29 -  - tylenol for pain control - lasix 40mg  bid - continue atorvastatin 80mg  - continue aspirin 325mg , ticagrelor 90mg  daily - nitrostat prn - discontinue amlodipine - switching to spironolactone 50mg  daily per cards recs  Weight gain  BLE swelling Still with some pitting edema in BLE. Down from 217 to 215 lbs. Only 296mL charted overnight although this felt to be inaccurate based on patient's subjective report. Follow up echo. Continue lasix 40mg  bid. DC amlodipine and switch to spironolactone 50mg  daily. - lasix 40mg  bid - F/U echocardiogram - strict I&Os, daily wts - discontinue amlodipine - starting spironolactone 50mg  daily  Type II diabetes Mellitus w/ neuropathy a1c of  10.7 from 05/16/2017. Glucose 234 on arrival. Takes lantus 42Units nightly. Will also place on moderate sliding scale. Well controlled on current regimen of lantus 42U and mSSI. Will continue these medications. - gabapentin 300mg  tid  - lyrica 100mg  bid - moderate sliding scale insulin - lantus 42 units nightly  Hypertension Blood pressure 125/67 at most recent check. Takes amlodipine, furosemide, lisinopril, metoprolol. Has taken medications today. - Discontinue amodipine 5mg  daily - starting Spironolactone 50mg  daily - start furosemide 40mg  bid as above - continue lisinopril 40mg  daily - continue metoprolol 100mg  bid  GERD Well controlled. Continue prilosec 20mg  daily.  FEN/GI: heart-healthy carb-modifed diet PPx: lovenox  Disposition: likely home  Subjective: Doing well this morning. Still complaining of chest pain, although patient was sleeping comfortably when I entered the room  Objective: Temp:  [98.7 F (37.1 C)-100.7 F (38.2 C)] 98.7 F (37.1 C) (05/30 1324) Pulse Rate:  [83-96] 85 (05/30 1324) Resp:  [5-22] 5 (05/30 0530) BP: (100-150)/(61-96) 125/67 (05/30 1324) SpO2:  [90 %-99 %] 96 % (05/30 1324) Weight:  [215 lb 8 oz (97.8 kg)] 215 lb 8 oz (97.8 kg) (05/29 2137) Physical Exam: General: alert, oriented, no acute distress. Middle aged 47 male. Cardiovascular: rrr, no murmurs/rubs/gallops. Palpable pt/dp bilaterally Respiratory: lungs CTAB, no increased work of breathing Gastrointestinal: soft, non-tender, non-distended MSK: 5/5 strength BUE, BLE.  Extremities: 1+ edema BLE Derm: warm, dry Neuro: alert, oriented x3. No focal neuro deficits Psych: appropriate  Laboratory: Recent Labs  Lab 06/07/17 0905 06/08/17 0630  WBC 19.8* 19.2*  HGB 11.7* 10.5*  HCT 36.7* 32.8*  PLT 462* 362   Recent Labs  Lab 06/06/17 1157 06/07/17 0905  06/08/17 0630  NA 142 137 135  K 5.1 4.5 4.0  CL 105 103 102  CO2 22 26 25   BUN 17 16 22*  CREATININE 1.46*  1.49* 1.61*  CALCIUM 9.8 8.9 8.3*  PROT  --  7.1  --   BILITOT  --  0.6  --   ALKPHOS  --  71  --   ALT  --  11*  --   AST  --  15  --   GLUCOSE 135* 234* 115*    Imaging/Diagnostic Tests: CLINICAL DATA:  Chest pain  EXAM: CHEST - 2 VIEW  COMPARISON:  05/18/2017  FINDINGS: Cardiac shadows within normal limits. The lungs are well aerated bilaterally. No focal infiltrate or sizable effusion is seen. No acute bony abnormality is noted.  IMPRESSION: No acute abnormality seen.   Electronically Signed   By: Inez Catalina M.D.   On: 06/07/2017 10:10  Guadalupe Dawn, MD 06/08/2017, 1:48 PM PGY-1, [place] Intern pager: [phone], text pages welcome

## 2017-06-08 NOTE — Progress Notes (Signed)
Patient complaining of 8 out of 10 chest pain.  Patient stated that morphine helps his chest pain, nitroglycerin does not.  Dr Criss Rosales notified, no new orders received.  Will Continue to monitor.

## 2017-06-09 ENCOUNTER — Telehealth: Payer: Self-pay

## 2017-06-09 DIAGNOSIS — I255 Ischemic cardiomyopathy: Secondary | ICD-10-CM

## 2017-06-09 LAB — CBC WITH DIFFERENTIAL/PLATELET
ABS IMMATURE GRANULOCYTES: 0.1 10*3/uL (ref 0.0–0.1)
Basophils Absolute: 0 10*3/uL (ref 0.0–0.1)
Basophils Relative: 0 %
Eosinophils Absolute: 0.1 10*3/uL (ref 0.0–0.7)
Eosinophils Relative: 1 %
HEMATOCRIT: 37.5 % — AB (ref 39.0–52.0)
HEMOGLOBIN: 12 g/dL — AB (ref 13.0–17.0)
IMMATURE GRANULOCYTES: 0 %
LYMPHS ABS: 2.3 10*3/uL (ref 0.7–4.0)
LYMPHS PCT: 15 %
MCH: 27.3 pg (ref 26.0–34.0)
MCHC: 32 g/dL (ref 30.0–36.0)
MCV: 85.2 fL (ref 78.0–100.0)
MONOS PCT: 13 %
Monocytes Absolute: 1.9 10*3/uL — ABNORMAL HIGH (ref 0.1–1.0)
Neutro Abs: 10.6 10*3/uL — ABNORMAL HIGH (ref 1.7–7.7)
Neutrophils Relative %: 71 %
Platelets: 399 10*3/uL (ref 150–400)
RBC: 4.4 MIL/uL (ref 4.22–5.81)
RDW: 15.1 % (ref 11.5–15.5)
WBC: 14.9 10*3/uL — ABNORMAL HIGH (ref 4.0–10.5)

## 2017-06-09 LAB — CREATININE, SERUM
Creatinine, Ser: 1.65 mg/dL — ABNORMAL HIGH (ref 0.61–1.24)
GFR calc non Af Amer: 49 mL/min — ABNORMAL LOW (ref >60)
GFR, EST AFRICAN AMERICAN: 57 mL/min — AB (ref >60)

## 2017-06-09 LAB — GLUCOSE, CAPILLARY
GLUCOSE-CAPILLARY: 162 mg/dL — AB (ref 65–99)
Glucose-Capillary: 179 mg/dL — ABNORMAL HIGH (ref 65–99)

## 2017-06-09 LAB — TROPONIN I: Troponin I: 0.05 ng/mL (ref <0.03)

## 2017-06-09 MED ORDER — AZITHROMYCIN 250 MG PO TABS: ORAL_TABLET | ORAL | 0 refills | Status: DC

## 2017-06-09 MED ORDER — SPIRONOLACTONE 50 MG PO TABS: 50.0000 mg | ORAL_TABLET | Freq: Every day | ORAL | 0 refills | Status: DC

## 2017-06-09 MED ORDER — ISOSORBIDE MONONITRATE ER 30 MG PO TB24: 15.0000 mg | ORAL_TABLET | Freq: Every day | ORAL | 0 refills | Status: DC

## 2017-06-09 MED ORDER — ISOSORBIDE MONONITRATE ER 30 MG PO TB24
15.0000 mg | ORAL_TABLET | Freq: Every day | ORAL | Status: DC
  Administered 2017-06-09: 15 mg via ORAL
  Filled 2017-06-09: qty 1

## 2017-06-09 MED FILL — AZITHROMYCIN 250 MG TABLET: 250 | 4 days supply | Qty: 4 | Fill #0

## 2017-06-09 MED FILL — SPIRONOLACTONE 50 MG TABLET: 50 | 30 days supply | Qty: 30 | Fill #0

## 2017-06-09 MED FILL — BRILINTA 90 MG TABLET: 90 | 30 days supply | Qty: 60 | Fill #1

## 2017-06-09 MED FILL — ISOSORBIDE MN ER 30 MG TAB: 30 | 30 days supply | Qty: 15 | Fill #0

## 2017-06-09 NOTE — Progress Notes (Signed)
Family Medicine Teaching Service Daily Progress Note Intern Pager: [phone]  Patient name: Aaron Pearson Medical record number: [id number] Date of birth: Sep 02, 1970 Age: 47 y.o. Gender: male  Primary Care Provider: Charlott Rakes, MD Consultants: cardiology Code Status: full  Pt Overview and Major Events to Date:  5/29 admitted to [facility]  Assessment and Plan: Aaron Pearson is a 47 y.o. male presenting with 6 month history of atypical chest pain. Patient with multiple stents in the past without much relief.   Chest Pain  CAD ? Atypical pneumonia Known Coronary artery disease s/p multiple stents. Some relief from nitro and morphine. Do not feel that morphine is a good treatment for chronic chest pain. No more narcotics during admission and will only give nitro and tylenol. Echo with no symptoms of pericarditis, normal appearing tricuspid valve. Felt that imdur would provide some symptomatic relief especially with grade 3 diastolic dysfunction so will start that medication. WBC improved from 15 to 19. Likely cause of chest pain due to 90% blockage in R PDA. Medical management per cardiology. - vital signs per floor routine - cardiac monitoring - continuous pulse ox - azithromycin 5/29 -  - tylenol for pain control - lasix 40mg  daily - continue atorvastatin 80mg  - continue aspirin 325mg , ticagrelor 90mg  daily - nitrostat prn - continue spironolactone 50mg  daily per cards recs - imdur 15mg  daily  Weight gain  BLE swelling Still with some pitting edema in BLE. Down to 204 from 217 lbs. Only 998mL charted overnight. Echo showing G3DD but normal ef. Will decrease lasix back to 40mg  daily from bid. Will need follow up in 3-5 days after discharge for K check. - lasix 40mg  - strict I&Os, daily wts - continue spironolactone 50mg  daily  Type II diabetes Mellitus w/ neuropathy a1c of 10.7 from 05/16/2017. Glucose 234 on arrival. Takes lantus 42Units nightly. Will also place on moderate sliding  scale. Well controlled on current regimen of lantus 42U and mSSI. Will continue these medications. - gabapentin 300mg  tid  - lyrica 100mg  bid - moderate sliding scale insulin - lantus 42 units nightly  Hypertension Blood pressure 128/50 at most recent check. Takes amlodipine, furosemide, lisinopril, metoprolol. Has taken medications today. - Discontinue amodipine 5mg  daily - starting Spironolactone 50mg  daily - start furosemide 40mg  bid as above - continue lisinopril 40mg  daily - continue metoprolol 100mg  bid  GERD Well controlled. Continue prilosec 20mg  daily.  FEN/GI: heart-healthy carb-modifed diet PPx: lovenox  Disposition: likely home  Subjective: Sleeping when entered room, some chest pain but perhaps some slight improvement.  Objective: Temp:  [98.3 F (36.8 C)-98.9 F (37.2 C)] 98.3 F (36.8 C) (05/31 0847) Pulse Rate:  [79-88] 80 (05/31 0847) Resp:  [15-20] 20 (05/31 0523) BP: (122-156)/(50-98) 128/50 (05/31 0847) SpO2:  [94 %-98 %] 96 % (05/31 0847) Weight:  [204 lb 1.6 oz (92.6 kg)-208 lb 1.6 oz (94.4 kg)] 204 lb 1.6 oz (92.6 kg) (05/31 1009) Physical Exam: General: alert, oriented, NAD. Middle aged 70 male. Cardiovascular: regular rate rhythm, no m/r/g. Palpable pt/dp bilaterally Respiratory: lungs Clear To Auscultation Bilaterally, no increased work of breathing Gastrointestinal: soft, non-tender, non-distended MSK: 5/5 strength BUE, BLE.  Extremities: 1+ edema BLE Derm: warm, dry Neuro: alert, oriented x3. No focal neuro deficits Psych: appropriate  Laboratory: Recent Labs  Lab 06/07/17 0905 06/08/17 0630 06/09/17 0218  WBC 19.8* 19.2* 14.9*  HGB 11.7* 10.5* 12.0*  HCT 36.7* 32.8* 37.5*  PLT 462* 362 399   Recent Labs  Lab 06/06/17 1157 06/07/17 0905  06/08/17 0630 06/09/17 0218  NA 142 137 135  --   K 5.1 4.5 4.0  --   CL 105 103 102  --   CO2 22 26 25   --   BUN 17 16 22*  --   CREATININE 1.46* 1.49* 1.61* 1.65*  CALCIUM 9.8  8.9 8.3*  --   PROT  --  7.1  --   --   BILITOT  --  0.6  --   --   ALKPHOS  --  71  --   --   ALT  --  11*  --   --   AST  --  15  --   --   GLUCOSE 135* 234* 115*  --     Imaging/Diagnostic Tests: CLINICAL DATA:  Chest pain  EXAM: CHEST - 2 VIEW  COMPARISON:  05/18/2017  FINDINGS: Cardiac shadows within normal limits. The lungs are well aerated bilaterally. No focal infiltrate or sizable effusion is seen. No acute bony abnormality is noted.  IMPRESSION: No acute abnormality seen.   Electronically Signed   By: Inez Catalina M.D.   On: 06/07/2017 10:10  Guadalupe Dawn, MD 06/09/2017, 12:28 PM PGY-1, [place] Intern pager: [phone], text pages welcome

## 2017-06-09 NOTE — Telephone Encounter (Signed)
Patient was called and informed of lab results. 

## 2017-06-09 NOTE — Discharge Instructions (Signed)
Please follow up in 3-5 days with pcp for a potassium check due to the spironolactone. We started a medication called imdur which should help some with your chest pain.     Chest Wall Pain Chest wall pain is pain in or around the bones and muscles of your chest. Sometimes, an injury causes this pain. Sometimes, the cause may not be known. This pain may take several weeks or longer to get better. Follow these instructions at home: Pay attention to any changes in your symptoms. Take these actions to help with your pain:  Rest as told by your doctor.  Avoid activities that cause pain. Try not to use your chest, belly (abdominal), or side muscles to lift heavy things.  If directed, apply ice to the painful area: ? Put ice in a plastic bag. ? Place a towel between your skin and the bag. ? Leave the ice on for 20 minutes, 2-3 times per day.  Take over-the-counter and prescription medicines only as told by your doctor.  Do not use tobacco products, including cigarettes, chewing tobacco, and e-cigarettes. If you need help quitting, ask your doctor.  Keep all follow-up visits as told by your doctor. This is important.  Contact a doctor if:  You have a fever.  Your chest pain gets worse.  You have new symptoms. Get help right away if:  You feel sick to your stomach (nauseous) or you throw up (vomit).  You feel sweaty or light-headed.  You have a cough with phlegm (sputum) or you cough up blood.  You are short of breath. This information is not intended to replace advice given to you by your health care provider. Make sure you discuss any questions you have with your health care provider. Document Released: 06/15/2007 Document Revised: 06/04/2015 Document Reviewed: 03/24/2014 Elsevier Interactive Patient Education  [employer].

## 2017-06-09 NOTE — Care Management Note (Addendum)
Case Management Note  Patient Details  Name: Aaron Pearson MRN: [id number] Date of Birth: 1970-01-12  Subjective/Objective:  Pt presented for Chest Pain- PTA from the [facility]. Plan will be to transition back to [facility] once stable. Last admission patient was established @ the [facility] [place], [place]. Pt can utilize the [facility] for medications.                   Action/Plan: Pt can call on Monday for hospital follow up if needed. Walk in appointments available on Wed/Thurs of next week. Unable to get one of those scheduled at this time 2/2 [facility] Director not available to approve the appointment. Clinic has worked with the patient in regards to [facility]. Pt states he has no issues obtaining medications. No further needs from CM at this time.    Expected Discharge Date:                  Expected Discharge Plan:  Homeless Shelter  In-House Referral:  Clinical Social Work  Discharge planning Services  CM Consult, Medication Assistance, Follow-up appt scheduled, [facility]  Post Acute Care Choice:  NA Choice offered to:  NA  DME Arranged:  N/A DME Agency:  NA  HH Arranged:  NA HH Agency:  NA  Status of Service:  Completed, signed off  If discussed at [employer] of Stay Meetings, dates discussed:    Additional Comments: [place] 06-09-17 Jacqlyn Krauss, RN, BSN [phone] CM was able to call back and speak with patient assistance at the clinic. Pt will be approved today to come over to pick up another Rx for Brilinta. No further needs from CM at this time.    1420 06-09-17 Jacqlyn Krauss, RN,BSN [phone] CM was able to speak with [facility] and pt has a refill for Brilinta in 4 days. Per Pharmacy pt was given a full bottle of Brilinta.    1235 06-09-17 Jacqlyn Krauss, RN,BSN [phone] CM received call from MD in regards to patient's Brilinta. Per MD pt only has 3 pills left and states he will be out and unable to afford.  CM did place a call to [facility] for assistance about [place]. Unable to speak with someone in the pharmacy-closed for lunch. CM did speak with pt and he states he has Rx on file for Brilinta. [place]. Pt states he does not have transportation to get to pharmacy. CM explained that pt can walk from the Hospital to the Clinic. Bus passes were supplied to transition back to [employer].  Bethena Roys, RN 06/09/2017, 11:47 AM

## 2017-06-12 ENCOUNTER — Encounter: Payer: Self-pay | Admitting: Pediatric Intensive Care

## 2017-06-12 ENCOUNTER — Telehealth: Payer: Self-pay

## 2017-06-12 LAB — CULTURE, BLOOD (ROUTINE X 2)
CULTURE: NO GROWTH
Culture: NO GROWTH
Special Requests: ADEQUATE
Special Requests: ADEQUATE

## 2017-06-12 NOTE — Telephone Encounter (Signed)
Transitional Care Clinic Post-discharge Follow-Up Phone Call:  Date of Discharge: 06/09/2017 Principal Discharge Diagnosis(es): chest pain, ? Atypical pneumonia, DM 2 with neuropathy Post-discharge Communication: (Clearly document all attempts clearly and date contact made) call placed to [place], [place] who was with the patient Call Completed:yes                    With Whom: patient and Emerson Monte, RN Interpreter Needed: No     Please check all that apply:  X  Patient is knowledgeable of his/her condition(s) and/or treatment. X  Patient is caring for self at [facility] with support for congregational nurse.  ? Patient is receiving assist at home from family and/or caregiver. Family and/or caregiver is knowledgeable of patient's condition(s) and/or treatment. ? Patient is receiving home health services. If so, name of agency.     Medication Reconciliation:  ? Medication list reviewed with patient. - the medications that are new were reviewed and he was instructed to stop the amlodipine and lasix,  [place] stated that she would review the list with him. He said that he did not have his discharge AVS with him at the time of this call.    Patient obtained all discharge medications. If not, why? - yes he has obtained his medications. As per Acquanetta Belling [place] he picked up zithromax, imdur and spironolactone and brilinta on 06/09/2017   Activities of Daily Living:  X  Independent  - he stated that he has his glucometer and has been using it as instructed.  ? Needs assist (describe; ? home DME used) ? [facility] (describe, ? home DME used)   Community resources in place for patient:  X   [employer] - patient is homeless at present time.  ? Home Health/Home DME ? Assisted Living ? Support Group        Questions/Concerns discussed: an appointment was scheduled with Dr Margarita Rana for 06/19/17 @ 0950. The patient did not report any questions/concerns at this  time

## 2017-06-12 NOTE — Congregational Nurse Program (Signed)
Congregational Nurse Program Note  Date of Encounter: 06/12/2017  Past Medical History: Past Medical History:  Diagnosis Date  . Anxiety   . 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 Coronary artery disease   . Diabetes mellitus without complication ([place])   . GERD (gastroesophageal reflux disease)   . Headache   . Hx of diabetic neuropathy   . Hypertension   . Myocardial infarction ([place]) 2018  . Renal disorder     Encounter Details: CNP Questionnaire - 06/12/17 0945      Questionnaire   Patient Status  Not Applicable    Race  White or Caucasian    Location Patient [place]  Not Applicable    Uninsured  Uninsured (Subsequent visits/quarter)    Food  No food insecurities    Housing/Utilities  No permanent housing    Transportation  Yes, need transportation assistance;Within past 12 months, lack of transportation negatively impacted life    Interpersonal Safety  Yes, feel physically and emotionally safe where you currently live    Medication  Yes, have medication insecurities    Medical Provider  Yes    Referrals  Other;Primary Care Provider/Clinic    ED Visit Averted  Not Applicable    Life-Saving Intervention Made  Not Applicable      Clinical Intake - 06/06/17 0953      Pre-visit preparation   Pre-visit preparation completed  Yes      Pain   Pain   0-10    Pain Score  7       Nutrition Screen   Diabetes  Yes    CBG done?  Yes    CBG resulted in Enter/ Edit results?  Yes    Did pt. bring in CBG monitor from home?  No      Functional Status   Activities of Daily Living  Independent    Ambulation  Independent    Medication Administration  Independent    Home Management  Independent      Abuse/Neglect   Do you feel unsafe in your current relationship?  No    Do you feel physically threatened by others?  No    Anyone hurting you at home, work, or school?  No    Unable to ask?  No      Investment banker, operational Needed?  No     Hospital  follow up encounter. BP check. Spoke with [place] CM regarding follow up appointment and medication changes. Client picked up new medication from pharmacy. He states he has started the Imdur. States that cardiologist wanted him to follow up today or tomorrow. CN called [facility] with client (he lost his primary cell phone) Office stated that DR Patwhardan wants to enroll client in a study. CN gave her work number as contact for study appointment. CN will provide transportation to [facility] appointment next week. Client agrees to plan.

## 2017-06-13 ENCOUNTER — Encounter: Payer: Self-pay | Admitting: Pediatric Intensive Care

## 2017-06-13 NOTE — Discharge Summary (Signed)
[facility] Discharge Summary  Patient name: Aaron Pearson Medical record number: [id number] Date of birth: [birth year] Age: 47 y.o. Gender: male Date of Admission: 06/07/2017  Date of Discharge: 06/09/2017 Admitting Physician: Guadalupe Dawn, MD  Primary Care Provider: Charlott Rakes, MD Consultants: cardiology  Indication for Hospitalization: chest pain  Discharge Diagnoses/Problem List:  Chest pain Atypical pneumonia Bilateral lower extremity swelling Type II diabetes mellitus w/ neuropathy Hypertension GERD  Disposition: home  Discharge Condition: stable  Discharge Exam: General:alert, oriented, NAD. Middle aged 55 male. Cardiovascular:regular rate rhythm, no m/r/g. Palpable pt/dp bilaterally Respiratory:lungs Clear To Auscultation Bilaterally, no increased work of breathing Gastrointestinal:soft, non-tender, non-distended MSK:5/5 strength BUE, BLE. Extremities: 1+ edema BLE Derm:warm, dry Neuro:alert, oriented x3. No focal neuro deficits Psych:appropriate  Brief Hospital Course:  47 year old male who was admitted on 5/29 due to chronic chest pain. Patient initially evaluated by cardiologist who wanted echocardiogram, po diuresis, and azithromycin to treat for possible atypical pneumonia. Patient's chest pain only relieved with morphine. On 5/30 patient had echo which showed G3 DD and no concerns for pericarditis. On 5/31 patient was switched to oral spironolactone for better diuresis as well as to decrease swelling concerns in BLE (previously on amlodipine). Patient was started on imdur on 6/1 with some mild improvement in chest pain symptoms.  Issues for Follow Up:  1. Recheck potassium 2. Recheck serum creatinine  3. Monitor chest pain 4. Ensure resolution of respiratory symptoms after completion of azithromycin  Significant Procedures: none  Significant Labs and Imaging:  Recent Labs  Lab 06/07/17 0905 06/08/17 0630  06/09/17 0218  WBC 19.8* 19.2* 14.9*  HGB 11.7* 10.5* 12.0*  HCT 36.7* 32.8* 37.5*  PLT 462* 362 399   Recent Labs  Lab 06/06/17 1157 06/07/17 0905 06/08/17 0630 06/09/17 0218  NA 142 137 135  --   K 5.1 4.5 4.0  --   CL 105 103 102  --   CO2 22 26 25   --   GLUCOSE 135* 234* 115*  --   BUN 17 16 22*  --   CREATININE 1.46* 1.49* 1.61* 1.65*  CALCIUM 9.8 8.9 8.3*  --   ALKPHOS  --  71  --   --   AST  --  15  --   --   ALT  --  11*  --   --   ALBUMIN  --  3.4*  --   --      Results/Tests Pending at Time of Discharge:   Discharge Medications:  Allergies as of 06/09/2017      Reactions   Benadryl [diphenhydramine] Nausea Only   Penicillins Other (See Comments)   Childhood allergy  Has patient had a PCN reaction causing immediate rash, facial/tongue/throat swelling, SOB or lightheadedness with hypotension: Unknown Has patient had a PCN reaction causing severe rash involving mucus membranes or skin necrosis: Unknown Has patient had a PCN reaction that required hospitalization: Unknown Has patient had a PCN reaction occurring within the last 10 years: No If all of the above answers are "NO", then may proceed with Cephalosporin use.      Medication List    STOP taking these medications   amLODipine 10 MG tablet Commonly known as:  NORVASC   furosemide 20 MG tablet Commonly known as:  LASIX   Tdap 5-2.5-18.5 LF-MCG/0.5 injection Commonly known as:  BOOSTRIX     TAKE these medications   acetaminophen 325 MG tablet Commonly known as:  TYLENOL Take 2 tablets (  650 mg total) by mouth every 6 (six) hours as needed for moderate pain or headache.   aspirin EC 81 MG tablet Take 81 mg by mouth daily.   atorvastatin 80 MG tablet Commonly known as:  LIPITOR Take 1 tablet (80 mg total) by mouth daily at 6 PM.   azithromycin 250 MG tablet Commonly known as:  ZITHROMAX Please take 1 dose of 250mg  daily for next 4 days   diclofenac sodium 1 % Gel Commonly known as:   VOLTAREN Apply 4 g topically 4 (four) times daily.   DULoxetine 60 MG capsule Commonly known as:  CYMBALTA Take 1 capsule (60 mg total) by mouth daily.   gabapentin 300 MG capsule Commonly known as:  NEURONTIN Take 3 capsules (900 mg total) by mouth 3 (three) times daily.   glucose blood test strip Commonly known as:  TRUE METRIX BLOOD GLUCOSE TEST 3 times daily before meals   insulin aspart 100 UNIT/ML FlexPen Commonly known as:  NOVOLOG FLEXPEN Inject 10 Units into the skin 3 (three) times daily with meals.   Insulin Glargine 100 UNIT/ML Solostar Pen Commonly known as:  LANTUS SOLOSTAR Inject 42 Units into the skin daily at 10 pm.   isosorbide mononitrate 30 MG 24 hr tablet Commonly known as:  IMDUR Take 0.5 tablets (15 mg total) by mouth daily.   lidocaine 5 % Commonly known as:  LIDODERM Place 1 patch onto the skin daily. Remove & Discard patch within 12 hours or as directed by MD   lisinopril 40 MG tablet Commonly known as:  PRINIVIL,ZESTRIL Take 1 tablet (40 mg total) by mouth every morning.   methocarbamol 500 MG tablet Commonly known as:  ROBAXIN Take 1 tablet (500 mg total) by mouth every 8 (eight) hours as needed for muscle spasms.   metoprolol tartrate 100 MG tablet Commonly known as:  LOPRESSOR Take 1 tablet (100 mg total) by mouth 2 (two) times daily.   mupirocin ointment 2 % Commonly known as:  BACTROBAN Place 1 application into the nose 2 (two) times daily.   nitroGLYCERIN 0.4 MG SL tablet Commonly known as:  NITROSTAT Place 1 tablet (0.4 mg total) under the tongue See admin instructions. Place 1 tablet (0.4mg ) under tongue every 5 minutes as needed for chest pain. Call doctor/ 911 if take 2 doses. Max 3/Day   omeprazole 20 MG capsule Commonly known as:  PRILOSEC Take 1 capsule (20 mg total) by mouth daily for 14 days.   potassium chloride SA 20 MEQ tablet Commonly known as:  K-DUR,KLOR-CON Take 1 tablet (20 mEq total) by mouth daily.    pregabalin 100 MG capsule Commonly known as:  LYRICA Take 1 capsule (100 mg total) by mouth 2 (two) times daily.   spironolactone 50 MG tablet Commonly known as:  ALDACTONE Take 1 tablet (50 mg total) by mouth daily.   sucralfate 1 g tablet Commonly known as:  CARAFATE Take 1 tablet (1 g total) by mouth 4 (four) times daily -  with meals and at bedtime for 10 days.   ticagrelor 90 MG Tabs tablet Commonly known as:  BRILINTA Take 1 tablet (90 mg total) by mouth 2 (two) times daily.   TRUE METRIX METER Devi 1 each by Does not apply route 3 (three) times daily before meals.   TRUEPLUS LANCETS 28G Misc 1 each by Does not apply route 3 (three) times daily before meals.       Discharge Instructions: Please refer to Patient Instructions section of EMR  for full details.  Patient was counseled important signs and symptoms that should prompt return to medical care, changes in medications, dietary instructions, activity restrictions, and follow up appointments.   Follow-Up Appointments: Follow-up Information    Charlott Rakes, MD Follow up.   Specialty:  Family Medicine Why:  Please call on Monday for hospital follow up appointment. The previous appointment for June 28th is for [facility].  Contact information: [place] [address] [phone]           Guadalupe Dawn, MD 06/13/2017, 1:07 AM PGY-1, [place]

## 2017-06-14 ENCOUNTER — Telehealth: Payer: Self-pay

## 2017-06-14 MED FILL — AMLODIPINE BESYLATE 10 MG T: 10 | 30 days supply | Qty: 30 | Fill #1

## 2017-06-14 MED FILL — ?DULoxetine HCL 6OMG CP: 60 | 30 days supply | Qty: 30 | Fill #1

## 2017-06-14 MED FILL — ?LIDOCAINE 5% PATCH: 5 | 30 days supply | Qty: 30 | Fill #1

## 2017-06-14 MED FILL — TRUEplus LANCETS 28G MISC: 30 days supply | Qty: 100 | Fill #1

## 2017-06-14 MED FILL — METOPROLOL TARTRATE 100 MG: 100 | 30 days supply | Qty: 60 | Fill #1

## 2017-06-14 MED FILL — LISINOPRIL 40 MG TABLET: 40 | 30 days supply | Qty: 30 | Fill #1

## 2017-06-14 MED FILL — TRUE METRIX TEST STRIP: 30 days supply | Qty: 100 | Fill #1

## 2017-06-14 MED FILL — NOVOLOG FLEXPEN SYRINGE: 100 | 30 days supply | Qty: 9 | Fill #1

## 2017-06-14 MED FILL — OMEPRAZOLE 20 MG CAP: 20 | 30 days supply | Qty: 30 | Fill #1

## 2017-06-14 MED FILL — ATORVASTATIN 80 MG TABLET: 80 | 30 days supply | Qty: 30 | Fill #1

## 2017-06-14 NOTE — Telephone Encounter (Signed)
As per Aaron Pearson ,[employer] [place], the patient is not eligible for their assistance with SSI at this stage of the process. She stated that they have spoken to the patient and explained this issue. Case is now closed.

## 2017-06-15 ENCOUNTER — Other Ambulatory Visit: Payer: Self-pay

## 2017-06-15 DIAGNOSIS — E1149 Type 2 diabetes mellitus with other diabetic neurological complication: Secondary | ICD-10-CM

## 2017-06-15 MED ORDER — PREGABALIN 100 MG PO CAPS: 100.0000 mg | ORAL_CAPSULE | Freq: Two times a day (BID) | ORAL | 3 refills | Status: DC

## 2017-06-16 ENCOUNTER — Observation Stay (HOSPITAL_COMMUNITY)
Admission: EM | Admit: 2017-06-16 | Discharge: 2017-06-17 | Disposition: A | Discharge status: Home / Self Care | Payer: Self-pay | Attending: Family Medicine | Admitting: Family Medicine

## 2017-06-16 ENCOUNTER — Encounter (HOSPITAL_COMMUNITY): Payer: Self-pay | Admitting: Emergency Medicine

## 2017-06-16 ENCOUNTER — Other Ambulatory Visit: Payer: Self-pay

## 2017-06-16 ENCOUNTER — Emergency Department (HOSPITAL_COMMUNITY): Payer: Self-pay

## 2017-06-16 DIAGNOSIS — E1140 Type 2 diabetes mellitus with diabetic neuropathy, unspecified: Secondary | ICD-10-CM | POA: Insufficient documentation

## 2017-06-16 DIAGNOSIS — I10 Essential (primary) hypertension: Secondary | ICD-10-CM

## 2017-06-16 DIAGNOSIS — I200 Unstable angina: Secondary | ICD-10-CM

## 2017-06-16 DIAGNOSIS — I252 Old myocardial infarction: Secondary | ICD-10-CM | POA: Insufficient documentation

## 2017-06-16 DIAGNOSIS — Z955 Presence of coronary angioplasty implant and graft: Secondary | ICD-10-CM | POA: Insufficient documentation

## 2017-06-16 DIAGNOSIS — G8929 Other chronic pain: Secondary | ICD-10-CM | POA: Insufficient documentation

## 2017-06-16 DIAGNOSIS — Z79899 Other long term (current) drug therapy: Secondary | ICD-10-CM | POA: Insufficient documentation

## 2017-06-16 DIAGNOSIS — K219 Gastro-esophageal reflux disease without esophagitis: Secondary | ICD-10-CM | POA: Insufficient documentation

## 2017-06-16 DIAGNOSIS — I2510 Atherosclerotic heart disease of native coronary artery without angina pectoris: Secondary | ICD-10-CM

## 2017-06-16 DIAGNOSIS — E118 Type 2 diabetes mellitus with unspecified complications: Secondary | ICD-10-CM

## 2017-06-16 DIAGNOSIS — F329 Major depressive disorder, single episode, unspecified: Secondary | ICD-10-CM | POA: Insufficient documentation

## 2017-06-16 DIAGNOSIS — Z7982 Long term (current) use of aspirin: Secondary | ICD-10-CM | POA: Insufficient documentation

## 2017-06-16 DIAGNOSIS — I255 Ischemic cardiomyopathy: Secondary | ICD-10-CM | POA: Insufficient documentation

## 2017-06-16 DIAGNOSIS — R0789 Other chest pain: Principal | ICD-10-CM | POA: Insufficient documentation

## 2017-06-16 DIAGNOSIS — I25110 Atherosclerotic heart disease of native coronary artery with unstable angina pectoris: Secondary | ICD-10-CM | POA: Insufficient documentation

## 2017-06-16 DIAGNOSIS — E785 Hyperlipidemia, unspecified: Secondary | ICD-10-CM | POA: Insufficient documentation

## 2017-06-16 DIAGNOSIS — Z880 Allergy status to penicillin: Secondary | ICD-10-CM | POA: Insufficient documentation

## 2017-06-16 DIAGNOSIS — R072 Precordial pain: Secondary | ICD-10-CM

## 2017-06-16 DIAGNOSIS — I4510 Unspecified right bundle-branch block: Secondary | ICD-10-CM | POA: Insufficient documentation

## 2017-06-16 DIAGNOSIS — Z794 Long term (current) use of insulin: Secondary | ICD-10-CM

## 2017-06-16 DIAGNOSIS — F419 Anxiety disorder, unspecified: Secondary | ICD-10-CM | POA: Insufficient documentation

## 2017-06-16 LAB — BASIC METABOLIC PANEL
Anion gap: 8 (ref 5–15)
BUN: 17 mg/dL (ref 6–20)
CALCIUM: 8.9 mg/dL (ref 8.9–10.3)
CO2: 23 mmol/L (ref 22–32)
CREATININE: 1.35 mg/dL — AB (ref 0.61–1.24)
Chloride: 109 mmol/L (ref 101–111)
GFR calc non Af Amer: >60 mL/min (ref >60)
GLUCOSE: 155 mg/dL — AB (ref 65–99)
Potassium: 3.8 mmol/L (ref 3.5–5.1)
Sodium: 140 mmol/L (ref 135–145)

## 2017-06-16 LAB — HEPATIC FUNCTION PANEL
ALT: 12 U/L — ABNORMAL LOW (ref 17–63)
AST: 13 U/L — ABNORMAL LOW (ref 15–41)
Albumin: 3.2 g/dL — ABNORMAL LOW (ref 3.5–5.0)
Alkaline Phosphatase: 88 U/L (ref 38–126)
BILIRUBIN TOTAL: 0.3 mg/dL (ref 0.3–1.2)
Total Protein: 7 g/dL (ref 6.5–8.1)

## 2017-06-16 LAB — LIPASE, BLOOD: LIPASE: 34 U/L (ref 11–51)

## 2017-06-16 LAB — CBC
HCT: 34.1 % — ABNORMAL LOW (ref 39.0–52.0)
Hemoglobin: 10.9 g/dL — ABNORMAL LOW (ref 13.0–17.0)
MCH: 27.8 pg (ref 26.0–34.0)
MCHC: 32 g/dL (ref 30.0–36.0)
MCV: 87 fL (ref 78.0–100.0)
PLATELETS: 493 10*3/uL — AB (ref 150–400)
RBC: 3.92 MIL/uL — ABNORMAL LOW (ref 4.22–5.81)
RDW: 14.6 % (ref 11.5–15.5)
WBC: 11.4 10*3/uL — ABNORMAL HIGH (ref 4.0–10.5)

## 2017-06-16 LAB — I-STAT TROPONIN, ED
TROPONIN I, POC: 0.01 ng/mL (ref 0.00–0.08)
TROPONIN I, POC: 0.01 ng/mL (ref 0.00–0.08)

## 2017-06-16 MED ORDER — MORPHINE SULFATE (PF) 4 MG/ML IV SOLN
4.0000 mg | Freq: Once | INTRAVENOUS | Status: AC
  Administered 2017-06-16: 4 mg via INTRAVENOUS
  Filled 2017-06-16: qty 1

## 2017-06-16 MED ORDER — HYDRALAZINE HCL 20 MG/ML IJ SOLN
10.0000 mg | Freq: Once | INTRAMUSCULAR | Status: AC
  Administered 2017-06-16: 10 mg via INTRAVENOUS
  Filled 2017-06-16: qty 1

## 2017-06-16 MED ORDER — KETOROLAC TROMETHAMINE 30 MG/ML IJ SOLN
30.0000 mg | Freq: Four times a day (QID) | INTRAMUSCULAR | Status: DC | PRN
  Administered 2017-06-16 – 2017-06-17 (×2): 30 mg via INTRAVENOUS
  Filled 2017-06-16 (×3): qty 1

## 2017-06-16 MED ORDER — ONDANSETRON HCL 4 MG/2ML IJ SOLN
4.0000 mg | Freq: Once | INTRAMUSCULAR | Status: AC
  Administered 2017-06-16: 4 mg via INTRAVENOUS
  Filled 2017-06-16: qty 2

## 2017-06-16 NOTE — ED Triage Notes (Signed)
Patient arrived with EMS, reports that he has had "9 stents in the last 6 months" reports central chest pain that radiates to left arm. He received 4 Nitro from EMS,  324mg  ASA. He continues to report pain of 9 on a 0-10 scale

## 2017-06-16 NOTE — ED Notes (Signed)
Repaged Cardiology ?

## 2017-06-16 NOTE — H&P (Signed)
[facility] Admission History and Physical Service Pager: [phone]  Patient name: Aaron Pearson Medical record number: [id number] Date of birth: Feb 14, 1970 Age: 47 y.o. Gender: male  Primary Care Provider: Charlott Rakes, MD Consultants: cardiology Code Status: Full  Chief Complaint: chest pain  Assessment and Plan: Aaron Pearson is a 47 y.o. male presenting with chest pain. PMH is significant for CAD s/p multiple stent placements, T2DM with neuropathy, HTN, GERD.  Atypical chest pain in the setting of CAD s/p multiple stents. Patient with recent admission with similar presentation, most recent from 5/29-5/31/19, for chronic intermittent chest pain. Most recent cardiac cath on 05/10/17 had show residual severe disease. Cardiology had seen patient during last admission and recommended medical management. Per chart review appears to be some concern regarding opiate seeking behavior. Patient is now back with L sided chest pain, reproducible on exam. HEART score 5. Trop poc 0.01x2. EKG unchanged. No dyspnea. ED provider discussed with patient's cardiologist who recommended monitoring overnight and increasing patient imdur. - place in observation, attending Dr. Andria Frames - monitor on telemetry - trend troponins - am EKG - prn tylenol and prn toradol - prn morphine 1mg  q6h for breakthrough, would wean off quickly. Patient is s/p 8mg  total of morphine from ED. - increase home imdur from 15mg  to 30mg  - cardiology consult, appreciate recommendations - consider ranexa - continue home ASA81 and brilinta  HTN. BP on admit 170/95 likely in setting of missing daytime doses.  - hydralazine IV 10mg  x1 - restart home meds norvasc 10mg , lisinopril 40mg , lopressor 100mg  BID, spironolactone 50mg  - imdur increased as per aboce  T2DM with neuropathy. Last a1c 10.7 on 05/16/17. At home on lantus 42U. - monitor CBGs - lantus 20U  - SSI - continue home gabapentin  HLD. Last LDL 90 on  05/05/17 - continue home atorvastatin  GERD - protonix  FEN/GI: heart healthy carb modified, protonix Prophylaxis: lovenox  Disposition: place in observation  History of Present Illness:  Aaron Pearson is a 47 y.o. male presenting with chest pain  Started yesterday evening about 7pm, while standing around waiting for food at his shelter. Chest pain progressively and gradually worsened, initial center of chest and then moved to L sided with radiation down L arm. Feels similar to multiple episodes of chest pain he has had since his heart attack. He states there was a little bit of associated shortness of breath and nausea. Not worsened with exertion. Not relieved with rest   Review Of Systems: Per HPI with the following additions:   Review of Systems  Constitutional: Negative for chills and fever.  Respiratory: Negative for shortness of breath and wheezing.   Cardiovascular: Positive for chest pain and leg swelling (chronic). Negative for palpitations.  Gastrointestinal: Positive for nausea. Negative for abdominal pain, constipation, diarrhea, heartburn and vomiting.  Genitourinary: Negative for dysuria, frequency and urgency.  Neurological: Positive for dizziness.    Patient Active Problem List   Diagnosis Date Noted  . Atypical chest pain 06/07/2017  . History of endocarditis   . Ischemic cardiomyopathy 06/06/2017  . Diabetic neuropathy ([place]) 05/29/2017  . Hypertension 05/16/2017  . Coronary artery disease 05/16/2017  . Anxiety and depression 05/16/2017  . Chronic pain 05/16/2017  . GERD (gastroesophageal reflux disease) 05/16/2017  . Type 2 diabetes mellitus ([place]) 05/16/2017  . Unstable angina ([place]) 05/05/2017  . Chest pain 05/04/2017    Past Medical History: Past Medical History:  Diagnosis Date  . Anxiety   . Arthritis  in my knees  . Coronary artery disease   . Diabetes mellitus without complication ([place])   . GERD (gastroesophageal reflux disease)   . Headache    . Hx of diabetic neuropathy   . Hypertension   . Myocardial infarction ([place]) 2018  . Renal disorder     Past Surgical History: Past Surgical History:  Procedure Laterality Date  . CARDIAC CATHETERIZATION  05/05/2017  . CARPAL TUNNEL RELEASE    . CORONARY STENT INTERVENTION N/A 05/05/2017   Procedure: CORONARY STENT INTERVENTION;  Surgeon: Nigel Mormon, MD;  Location: [facility];  Service: Cardiovascular;  Laterality: N/A;  . CORONARY STENT PLACEMENT    . INTRAVASCULAR PRESSURE WIRE/FFR STUDY N/A 05/05/2017   Procedure: INTRAVASCULAR PRESSURE WIRE/FFR STUDY;  Surgeon: Nigel Mormon, MD;  Location: [facility];  Service: Cardiovascular;  Laterality: N/A;  . INTRAVASCULAR PRESSURE WIRE/FFR STUDY N/A 05/10/2017   Procedure: INTRAVASCULAR PRESSURE WIRE/FFR STUDY;  Surgeon: Nigel Mormon, MD;  Location: [place];  Service: Cardiovascular;  Laterality: N/A;  . LEFT HEART CATH AND CORONARY ANGIOGRAPHY N/A 05/05/2017   Procedure: LEFT HEART CATH AND CORONARY ANGIOGRAPHY;  Surgeon: Nigel Mormon, MD;  Location: [facility];  Service: Cardiovascular;  Laterality: N/A;  . LEFT HEART CATH AND CORONARY ANGIOGRAPHY N/A 05/10/2017   Procedure: LEFT HEART CATH AND CORONARY ANGIOGRAPHY;  Surgeon: Nigel Mormon, MD;  Location: [place] [facility];  Service: Cardiovascular;  Laterality: N/A;    Social History: Social History   Tobacco Use  . Smoking status: Never Smoker  . Smokeless tobacco: Never Used  Substance Use Topics  . Alcohol use: Never    Frequency: Never  . Drug use: Never   Additional social history: Lives at shelter.  No alcohol or recreational drug use.  Please also refer to relevant sections of EMR.  Family History: Family History  Problem Relation Age of Onset  . Coronary artery disease Mother   . Coronary artery disease Father     Allergies and Medications: Allergies  Allergen Reactions  . Benadryl  [Diphenhydramine] Nausea Only  . Penicillins Other (See Comments)    Childhood allergy  Has patient had a PCN reaction causing immediate rash, facial/tongue/throat swelling, SOB or lightheadedness with hypotension: Unknown Has patient had a PCN reaction causing severe rash involving mucus membranes or skin necrosis: Unknown Has patient had a PCN reaction that required hospitalization: Unknown Has patient had a PCN reaction occurring within the last 10 years: No If all of the above answers are "NO", then may proceed with Cephalosporin use.    No current facility-administered medications on file prior to encounter.    Current Outpatient Medications on File Prior to Encounter  Medication Sig Dispense Refill  . acetaminophen (TYLENOL) 325 MG tablet Take 2 tablets (650 mg total) by mouth every 6 (six) hours as needed for moderate pain or headache. 90 tablet 3  . amLODipine (NORVASC) 10 MG tablet Take 10 mg by mouth daily.  3  . aspirin EC 81 MG tablet Take 81 mg by mouth daily.    Marland Kitchen atorvastatin (LIPITOR) 80 MG tablet Take 1 tablet (80 mg total) by mouth daily at 6 PM. 30 tablet 3  . diclofenac sodium (VOLTAREN) 1 % GEL Apply 4 g topically 4 (four) times daily. 100 g 1  . DULoxetine (CYMBALTA) 60 MG capsule Take 1 capsule (60 mg total) by mouth daily. 30 capsule 3  . gabapentin (NEURONTIN) 300 MG capsule Take  3 capsules (900 mg total) by mouth 3 (three) times daily. 270 capsule 0  . insulin aspart (NOVOLOG FLEXPEN) 100 UNIT/ML FlexPen Inject 10 Units into the skin 3 (three) times daily with meals. 30 mL 3  . Insulin Glargine (LANTUS SOLOSTAR) 100 UNIT/ML Solostar Pen Inject 42 Units into the skin daily at 10 pm. 5 pen 3  . isosorbide mononitrate (IMDUR) 30 MG 24 hr tablet Take 0.5 tablets (15 mg total) by mouth daily. 30 tablet 0  . lidocaine (LIDODERM) 5 % Place 1 patch onto the skin daily. Remove & Discard patch within 12 hours or as directed by MD 30 patch 1  . lisinopril (PRINIVIL,ZESTRIL) 40  MG tablet Take 1 tablet (40 mg total) by mouth every morning. 30 tablet 3  . methocarbamol (ROBAXIN) 500 MG tablet Take 1 tablet (500 mg total) by mouth every 8 (eight) hours as needed for muscle spasms. 90 tablet 3  . metoprolol tartrate (LOPRESSOR) 100 MG tablet Take 1 tablet (100 mg total) by mouth 2 (two) times daily. 60 tablet 3  . omeprazole (PRILOSEC) 20 MG capsule Take 20 mg by mouth daily.    . potassium chloride SA (K-DUR,KLOR-CON) 20 MEQ tablet Take 1 tablet (20 mEq total) by mouth daily. 30 tablet 3  . spironolactone (ALDACTONE) 50 MG tablet Take 1 tablet (50 mg total) by mouth daily. 30 tablet 0  . ticagrelor (BRILINTA) 90 MG TABS tablet Take 1 tablet (90 mg total) by mouth 2 (two) times daily. 180 tablet 3  . azithromycin (ZITHROMAX) 250 MG tablet Please take 1 dose of 250mg  daily for next 4 days (Patient not taking: Reported on 06/16/2017) 4 each 0  . Blood Glucose Monitoring Suppl (TRUE METRIX METER) DEVI 1 each by Does not apply route 3 (three) times daily before meals. 1 Device 0  . glucose blood (TRUE METRIX BLOOD GLUCOSE TEST) test strip 3 times daily before meals 100 each 12  . mupirocin ointment (BACTROBAN) 2 % Place 1 application into the nose 2 (two) times daily. (Patient not taking: Reported on 06/16/2017) 22 g 0  . nitroGLYCERIN (NITROSTAT) 0.4 MG SL tablet Place 1 tablet (0.4 mg total) under the tongue See admin instructions. Place 1 tablet (0.4mg ) under tongue every 5 minutes as needed for chest pain. Call doctor/ 911 if take 2 doses. Max 3/Day 30 tablet 3  . pregabalin (LYRICA) 100 MG capsule Take 1 capsule (100 mg total) by mouth 2 (two) times daily. (Patient not taking: Reported on 06/16/2017) 180 capsule 3  . sucralfate (CARAFATE) 1 g tablet Take 1 tablet (1 g total) by mouth 4 (four) times daily -  with meals and at bedtime for 10 days. (Patient not taking: Reported on 06/07/2017) 40 tablet 0  . TRUEPLUS LANCETS 28G MISC 1 each by Does not apply route 3 (three) times daily  before meals. 100 each 12    Objective: BP (!) 171/105   Pulse (!) 105   Temp 98.1 F (36.7 C) (Oral)   Resp (!) 9   SpO2 99%  Exam: General: Sitting up in bed, in NAD Eyes: PERRL, EOMI ENTM: MMM Neck: supple, normal ROM  Cardiovascular: RRR, no murmurs. Chest pain reproducible with palpation of anterior chest wall Respiratory: CTAB, normal effort Gastrointestinal: soft, nontender, + bowel sounds MSK: trace LE edema Derm: warm and dry Neuro: grossly normal Psych: appropriate affect  Labs and Imaging: CBC BMET  Recent Labs  Lab 06/16/17 1648  WBC 11.4*  HGB 10.9*  HCT 34.1*  PLT 493*   Recent Labs  Lab 06/16/17 1648  NA 140  K 3.8  CL 109  CO2 23  BUN 17  CREATININE 1.35*  GLUCOSE 155*  CALCIUM 8.9      Troponin (Point of Care Test) Recent Labs    06/16/17 2011  TROPIPOC 0.01    EKG old RBB  Dg Chest 2 View  Result Date: 06/16/2017 CLINICAL DATA:  Chest pain EXAM: CHEST - 2 VIEW COMPARISON:  06/07/2017 chest radiograph. FINDINGS: Stable cardiomediastinal silhouette with normal heart size. No pneumothorax. No pleural effusion. Slightly low lung volumes. No pulmonary edema. No acute consolidative airspace disease. IMPRESSION: No active cardiopulmonary disease. Electronically Signed   By: Ilona Sorrel M.D.   On: 06/16/2017 17:31    Bufford Lope, DO 06/16/2017, 10:50 PM PGY-2, [place] [facility] pager: [phone], text pages welcome

## 2017-06-16 NOTE — ED Provider Notes (Addendum)
Emergency Department Provider Note   I have reviewed the triage vital signs and the nursing notes.   HISTORY  Chief Complaint Chest Pain   HPI Aaron Pearson is a 47 y.o. male with PMH of DM, GERD, HTN, and CAD with last cath on 05/10/17's to the emergency department for evaluation of left-sided chest pain.  He describes chest pain which feels similar to prior episodes which began at 7 PM last night.  His pain is constant since that time.  He states it worsened slightly with movement or walking.  Denies any nausea or diaphoresis.  No heart palpitations or shortness of breath.  When symptoms did not resolve he called EMS who gave 4 nitroglycerin tablets and full dose aspirin which did not improve his pain symptoms.  He states the pain radiates to the left arm.  He reports in the past morphine has improved his pain.   Past Medical History:  Diagnosis Date  . Anxiety   . 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 Coronary artery disease   . Diabetes mellitus without complication ([place])   . GERD (gastroesophageal reflux disease)   . Headache   . Hx of diabetic neuropathy   . Hypertension   . Myocardial infarction ([place]) 2018  . Renal disorder     Patient Active Problem List   Diagnosis Date Noted  . Atypical chest pain 06/07/2017  . History of endocarditis   . Ischemic cardiomyopathy 06/06/2017  . Diabetic neuropathy ([place]) 05/29/2017  . Hypertension 05/16/2017  . Coronary artery disease 05/16/2017  . Anxiety and depression 05/16/2017  . Chronic pain 05/16/2017  . GERD (gastroesophageal reflux disease) 05/16/2017  . Type 2 diabetes mellitus ([place]) 05/16/2017  . Unstable angina ([place]) 05/05/2017  . Chest pain 05/04/2017    Past Surgical History:  Procedure Laterality Date  . CARDIAC CATHETERIZATION  05/05/2017  . CARPAL TUNNEL RELEASE    . CORONARY STENT INTERVENTION N/A 05/05/2017   Procedure: CORONARY STENT INTERVENTION;  Surgeon: Nigel Mormon, MD;  Location: [facility];   Service: Cardiovascular;  Laterality: N/A;  . CORONARY STENT PLACEMENT    . INTRAVASCULAR PRESSURE WIRE/FFR STUDY N/A 05/05/2017   Procedure: INTRAVASCULAR PRESSURE WIRE/FFR STUDY;  Surgeon: Nigel Mormon, MD;  Location: [facility];  Service: Cardiovascular;  Laterality: N/A;  . INTRAVASCULAR PRESSURE WIRE/FFR STUDY N/A 05/10/2017   Procedure: INTRAVASCULAR PRESSURE WIRE/FFR STUDY;  Surgeon: Nigel Mormon, MD;  Location: [facility];  Service: Cardiovascular;  Laterality: N/A;  . LEFT HEART CATH AND CORONARY ANGIOGRAPHY N/A 05/05/2017   Procedure: LEFT HEART CATH AND CORONARY ANGIOGRAPHY;  Surgeon: Nigel Mormon, MD;  Location: [facility];  Service: Cardiovascular;  Laterality: N/A;  . LEFT HEART CATH AND CORONARY ANGIOGRAPHY N/A 05/10/2017   Procedure: LEFT HEART CATH AND CORONARY ANGIOGRAPHY;  Surgeon: Nigel Mormon, MD;  Location: [facility];  Service: Cardiovascular;  Laterality: N/A;      Allergies Benadryl [diphenhydramine] and Penicillins  Family History  Problem Relation Age of Onset  . Coronary artery disease Mother   . Coronary artery disease Father     Social History Social History   Tobacco Use  . Smoking status: Never Smoker  . Smokeless tobacco: Never Used  Substance Use Topics  . Alcohol use: Never    Frequency: Never  . Drug use: Never    Review of Systems  Constitutional: No fever/chills Eyes: No visual changes. ENT: No sore throat.  Cardiovascular: Positive chest pain. Respiratory: Denies shortness of breath. Gastrointestinal: No abdominal pain.  No nausea, no vomiting.  No diarrhea.  No constipation. Genitourinary: Negative for dysuria. Musculoskeletal: Negative for back pain. Skin: Negative for rash. Neurological: Negative for headaches, focal weakness or numbness.  10-point ROS otherwise negative.  ____________________________________________   PHYSICAL EXAM:  VITAL SIGNS: ED Triage Vitals   Enc Vitals Group     BP 06/16/17 1633 (!) 163/100     Pulse Rate 06/16/17 1633 (!) 109     Resp 06/16/17 1633 17     Temp 06/16/17 1634 98.1 F (36.7 C)     Temp Source 06/16/17 1633 Oral     SpO2 06/16/17 1633 98 %     Pain Score 06/16/17 1641 9   Constitutional: Alert and oriented. Well appearing and in no acute distress. Eyes: Conjunctivae are normal.  Head: Atraumatic. Nose: No congestion/rhinnorhea. Mouth/Throat: Mucous membranes are moist. Neck: No stridor.  Cardiovascular: Tachycardia. Good peripheral circulation. Grossly normal heart sounds.   Respiratory: Normal respiratory effort.  No retractions. Lungs CTAB. Gastrointestinal: Soft and nontender. No distention.  Musculoskeletal: No lower extremity tenderness nor edema. No gross deformities of extremities. Neurologic:  Normal speech and language. No gross focal neurologic deficits are appreciated.  Skin:  Skin is warm, dry and intact. No rash noted.  ____________________________________________   LABS (all labs ordered are listed, but only abnormal results are displayed)  Labs Reviewed  BASIC METABOLIC PANEL - Abnormal; Notable for the following components:      Result Value   Glucose, Bld 155 (*)    Creatinine, Ser 1.35 (*)    All other components within normal limits  CBC - Abnormal; Notable for the following components:   WBC 11.4 (*)    RBC 3.92 (*)    Hemoglobin 10.9 (*)    HCT 34.1 (*)    Platelets 493 (*)    All other components within normal limits  HEPATIC FUNCTION PANEL - Abnormal; Notable for the following components:   Albumin 3.2 (*)    AST 13 (*)    ALT 12 (*)    Bilirubin, Direct <0.1 (*)    All other components within normal limits  GLUCOSE, CAPILLARY - Abnormal; Notable for the following components:   Glucose-Capillary 170 (*)    All other components within normal limits  LIPASE, BLOOD  TROPONIN I  TROPONIN I  TROPONIN I  I-STAT TROPONIN, ED  I-STAT TROPONIN, ED    ____________________________________________  EKG   EKG Interpretation  Date/Time:  Friday June 16 2017 16:32:32 EDT Ventricular Rate:  107 PR Interval:    QRS Duration: 128 QT Interval:  379 QTC Calculation: 506 R Axis:   -29 Text Interpretation:  Sinus tachycardia Probable left atrial enlargement Right bundle branch block No STEMI.  Confirmed by Nanda Quinton [phone]) on 06/16/2017 4:40:48 PM       ____________________________________________  RADIOLOGY  Dg Chest 2 View  Result Date: 06/16/2017 CLINICAL DATA:  Chest pain EXAM: CHEST - 2 VIEW COMPARISON:  06/07/2017 chest radiograph. FINDINGS: Stable cardiomediastinal silhouette with normal heart size. No pneumothorax. No pleural effusion. Slightly low lung volumes. No pulmonary edema. No acute consolidative airspace disease. IMPRESSION: No active cardiopulmonary disease. Electronically Signed   By: Ilona Sorrel M.D.   On: 06/16/2017 17:31    ____________________________________________   PROCEDURES  Procedure(s) performed:   Procedures  CRITICAL CARE Performed by: Margette Fast Total critical care time: 35 minutes Critical care time was exclusive of  separately billable procedures and treating other patients. Critical care was necessary to treat or prevent imminent or life-threatening deterioration. Critical care was time spent personally by me on the following activities: development of treatment plan with patient and/or surrogate as well as nursing, discussions with consultants, evaluation of patient's response to treatment, examination of patient, obtaining history from patient or surrogate, ordering and performing treatments and interventions, ordering and review of laboratory studies, ordering and review of radiographic studies, pulse oximetry and re-evaluation of patient's condition.  Nanda Quinton, MD Emergency Medicine  ____________________________________________   INITIAL IMPRESSION / ASSESSMENT AND PLAN / ED  COURSE  Pertinent labs & imaging results that were available during my care of the patient were reviewed by me and considered in my medical decision making (see chart for details).  Patient presents to the emergency department for evaluation of left-sided chest pain.  He has multiple ED presentations with similar pain symptoms.  His last heart catheterization was on May 10, 2017 with some drug-eluting stent placement required at that time.  Symptoms have been constant for the past 22 hours.  Plan for pain control here along with labs, chest x-ray, reassess.  10:39 PM With the cardiology fellow regarding the case.  Patient's second troponin is normal.  Given his serious disease contrasted with his recent extensive evaluation plan for increasing his Imdur to double the current dose and monitoring overnight.  If enzymes cycled negative would try and optimize his medical management and have him follow-up as an outpatient.   Discussed patient's case with Family med to request admission. Patient and family (if present) updated with plan. Care transferred to [facility] med service.  I reviewed all nursing notes, vitals, pertinent old records, EKGs, labs, imaging (as available).  ____________________________________________  FINAL CLINICAL IMPRESSION(S) / ED DIAGNOSES  Final diagnoses:  Precordial chest pain     MEDICATIONS GIVEN DURING THIS VISIT:  Medications  amLODipine (NORVASC) tablet 10 mg (has no administration in time range)  aspirin EC tablet 81 mg (has no administration in time range)  atorvastatin (LIPITOR) tablet 80 mg (has no administration in time range)  DULoxetine (CYMBALTA) DR capsule 60 mg (has no administration in time range)  gabapentin (NEURONTIN) capsule 900 mg (has no administration in time range)  isosorbide mononitrate (IMDUR) 24 hr tablet 30 mg (has no administration in time range)  lisinopril (PRINIVIL,ZESTRIL) tablet 40 mg (has no administration in time range)    metoprolol tartrate (LOPRESSOR) tablet 100 mg (has no administration in time range)  pantoprazole (PROTONIX) EC tablet 40 mg (has no administration in time range)  spironolactone (ALDACTONE) tablet 50 mg (has no administration in time range)  ticagrelor (BRILINTA) tablet 90 mg (has no administration in time range)  acetaminophen (TYLENOL) tablet 650 mg (has no administration in time range)  ondansetron (ZOFRAN) injection 4 mg (has no administration in time range)  insulin glargine (LANTUS) injection 20 Units (has no administration in time range)  insulin aspart (novoLOG) injection 0-15 Units (has no administration in time range)  enoxaparin (LOVENOX) injection 40 mg (has no administration in time range)  morphine 2 MG/ML injection 1 mg (has no administration in time range)  ketorolac (TORADOL) 30 MG/ML injection 30 mg (30 mg Intravenous Given 06/16/17 2333)  morphine 4 MG/ML injection 4 mg (4 mg Intravenous Given 06/16/17 1804)  ondansetron (ZOFRAN) injection 4 mg (4 mg Intravenous Given 06/16/17 1803)  morphine 4 MG/ML injection 4 mg (4 mg Intravenous Given 06/16/17 2118)  hydrALAZINE (APRESOLINE) injection 10 mg (10  mg Intravenous Given 06/16/17 2333)    Note:  This document was prepared using Dragon voice recognition software and may include unintentional dictation errors.  Nanda Quinton, MD Emergency Medicine    Jahking Lesser, Wonda [facility], MD 06/17/17 2703    Margette Fast, MD 06/23/17 [phone]

## 2017-06-17 ENCOUNTER — Encounter (HOSPITAL_COMMUNITY): Payer: Self-pay

## 2017-06-17 DIAGNOSIS — I200 Unstable angina: Secondary | ICD-10-CM

## 2017-06-17 LAB — GLUCOSE, CAPILLARY
Glucose-Capillary: 123 mg/dL — ABNORMAL HIGH (ref 65–99)
Glucose-Capillary: 153 mg/dL — ABNORMAL HIGH (ref 65–99)
Glucose-Capillary: 170 mg/dL — ABNORMAL HIGH (ref 65–99)

## 2017-06-17 LAB — TROPONIN I
Troponin I: <0.03 ng/mL (ref <0.03)
Troponin I: <0.03 ng/mL (ref <0.03)

## 2017-06-17 MED ORDER — ONDANSETRON HCL 4 MG/2ML IJ SOLN: 4.0000 mg | Freq: Four times a day (QID) | INTRAMUSCULAR | Status: DC | PRN

## 2017-06-17 MED ORDER — GABAPENTIN 300 MG PO CAPS
900.0000 mg | ORAL_CAPSULE | Freq: Three times a day (TID) | ORAL | Status: DC
  Administered 2017-06-17 (×2): 900 mg via ORAL
  Filled 2017-06-17 (×2): qty 3

## 2017-06-17 MED ORDER — ACETAMINOPHEN 325 MG PO TABS: 650.0000 mg | ORAL_TABLET | ORAL | Status: DC | PRN

## 2017-06-17 MED ORDER — ENOXAPARIN SODIUM 40 MG/0.4ML ~~LOC~~ SOLN: 40.0000 mg | Freq: Every day | SUBCUTANEOUS | Status: DC

## 2017-06-17 MED ORDER — ASPIRIN EC 81 MG PO TBEC
81.0000 mg | DELAYED_RELEASE_TABLET | Freq: Every day | ORAL | Status: DC
  Administered 2017-06-17: 81 mg via ORAL
  Filled 2017-06-17: qty 1

## 2017-06-17 MED ORDER — TICAGRELOR 90 MG PO TABS
90.0000 mg | ORAL_TABLET | Freq: Two times a day (BID) | ORAL | Status: DC
  Administered 2017-06-17: 90 mg via ORAL
  Filled 2017-06-17 (×2): qty 1

## 2017-06-17 MED ORDER — SPIRONOLACTONE 25 MG PO TABS
50.0000 mg | ORAL_TABLET | Freq: Every day | ORAL | Status: DC
  Administered 2017-06-17: 50 mg via ORAL
  Filled 2017-06-17: qty 2

## 2017-06-17 MED ORDER — INSULIN ASPART 100 UNIT/ML ~~LOC~~ SOLN
0.0000 [IU] | Freq: Three times a day (TID) | SUBCUTANEOUS | Status: DC
Start: 2017-06-17 — End: 2017-06-17
  Administered 2017-06-17: 3 [IU] via SUBCUTANEOUS

## 2017-06-17 MED ORDER — DULOXETINE HCL 60 MG PO CPEP
60.0000 mg | ORAL_CAPSULE | Freq: Every day | ORAL | Status: DC
  Administered 2017-06-17: 60 mg via ORAL
  Filled 2017-06-17: qty 1

## 2017-06-17 MED ORDER — PANTOPRAZOLE SODIUM 40 MG PO TBEC
40.0000 mg | DELAYED_RELEASE_TABLET | Freq: Every day | ORAL | Status: DC
  Administered 2017-06-17: 40 mg via ORAL
  Filled 2017-06-17: qty 1

## 2017-06-17 MED ORDER — LISINOPRIL 20 MG PO TABS
40.0000 mg | ORAL_TABLET | Freq: Every morning | ORAL | Status: DC
  Administered 2017-06-17: 40 mg via ORAL
  Filled 2017-06-17: qty 2

## 2017-06-17 MED ORDER — MORPHINE SULFATE (PF) 2 MG/ML IV SOLN: 1.0000 mg | Freq: Four times a day (QID) | INTRAVENOUS | Status: DC | PRN

## 2017-06-17 MED ORDER — ISOSORBIDE MONONITRATE ER 30 MG PO TB24
30.0000 mg | ORAL_TABLET | Freq: Every day | ORAL | Status: DC
  Administered 2017-06-17: 30 mg via ORAL
  Filled 2017-06-17: qty 1

## 2017-06-17 MED ORDER — ISOSORBIDE MONONITRATE ER 30 MG PO TB24: 30.0000 mg | ORAL_TABLET | Freq: Every day | ORAL | 0 refills | Status: DC

## 2017-06-17 MED ORDER — ATORVASTATIN CALCIUM 80 MG PO TABS: 80.0000 mg | ORAL_TABLET | Freq: Every day | ORAL | Status: DC

## 2017-06-17 MED ORDER — INSULIN GLARGINE 100 UNIT/ML ~~LOC~~ SOLN
20.0000 [IU] | Freq: Every day | SUBCUTANEOUS | Status: DC
  Administered 2017-06-17: 20 [IU] via SUBCUTANEOUS
  Filled 2017-06-17 (×2): qty 0.2

## 2017-06-17 MED ORDER — METOPROLOL TARTRATE 50 MG PO TABS
100.0000 mg | ORAL_TABLET | Freq: Two times a day (BID) | ORAL | Status: DC
  Administered 2017-06-17 (×2): 100 mg via ORAL
  Filled 2017-06-17 (×2): qty 2

## 2017-06-17 MED ORDER — AMLODIPINE BESYLATE 10 MG PO TABS
10.0000 mg | ORAL_TABLET | Freq: Every day | ORAL | Status: DC
  Administered 2017-06-17: 10 mg via ORAL
  Filled 2017-06-17: qty 1

## 2017-06-17 NOTE — Progress Notes (Signed)
Pt d/c 15:30. RN checked each medication bottle with pt to insure knowledge of current meds. Pt provided with rolling walker, tested and adjusted. Bus pass provided for pt at discharge.

## 2017-06-17 NOTE — Discharge Instructions (Signed)
Your chest pain is not related to active ischemia.  This is likely secondary to your chronic heart disease and will likely not improve.  We have increased your Imdur to 30 mg daily.  Continue taking her aspirin and Brilinta as prescribed.  Follow-up with your cardiologist.  We have provided you a rolling walker with a seat and a letter to the shelter to allow you to stay between the hours of noon and 4 PM.

## 2017-06-17 NOTE — Care Management (Addendum)
[facility] and plans to return there at discharge - [facility] consulted.  Pt is active with [facility].  Bedside nurse worked with pt ambulating with the assistance of rollator.  Pt requested rolling walker with seat- order received.  [facility] accepted charity DME referral.  Pt informed CM that he has adequate inventory of PTA medications including Brilinta

## 2017-06-17 NOTE — Discharge Summary (Signed)
[facility] Discharge Summary  Patient name: Aaron Pearson Medical record number: [id number] Date of birth: August 24, 1970 Age: 47 y.o. Gender: male Date of Admission: 06/16/2017  Date of Discharge: 07/16/2017 Admitting Physician: Zenia Resides, MD  Primary Care Provider: Charlott Rakes, MD Consultants: None  Indication for Hospitalization: Atypical chest pain  Discharge Diagnoses/Problem List:  Atypical chest pain CAD Primary hypertension Diabetes mellitus type 2 with neuropathy  GERD  Disposition: Home  Discharge Condition: Stable, improved  Discharge Exam:  General: patient fast asleep in bed, well nourished, well developed, NAD with non-toxic appearance HEENT: normocephalic, atraumatic, moist mucous membranes Neck: supple, non-tender without lymphadenopathy Cardiovascular: regular rate and rhythm without murmurs, rubs, or gallops, left chest wall tenderness with gentle palpation Lungs: clear to auscultation bilaterally with normal work of breathing Abdomen: soft, non-tender, non-distended, normoactive bowel sounds Skin: warm, dry, no rashes or lesions, cap refill < 2 seconds Extremities: warm and well perfused, normal tone, no edema  Brief Hospital Course:  Aaron Pearson is a 47 year old male presenting with worsening of his chronic atypical chest pain.  PMH significant for CAD status post multiple stent placements, diabetes mellitus type 2 with neuropathy, primary hypertension, GERD.  Patient was at a shelter waiting in line for food around 7 PM when he began having gradual worsening of his chronic left-sided chest pain.  Patient was also endorsing a mild shortness of breath and nausea during the episode.  Patient was sent to [facility] ED for evaluation for ACS.  Patient was given aspirin and nitroglycerin on arrival without improvement.  EKG showed NSR with old RBBB without ST changes or T wave abnormalities.  CXR negative.  Decision was made to admit for  observation overnight.  Lead with IV morphine.  Imdur was increased to 30 mg daily.  Patient stated Imdur did not improve symptoms along with nitroglycerin.  Troponins were trended and negative x3.  Repeat EKG unchanged.  Decision was made to discharge with outpatient cardiology follow-up.  Chest pain was unlikely related to active ischemia.  Issues for Follow Up:  1. Increased Imdur to 30 mg daily.  Instructed patient to continue aspirin and Brilinta as prescribed.  Patient instructed to contact cardiology office and schedule follow-up appointment. 2. Patient was given a rolling walker with seat and letter stating patient can stay in shelter between the hours of noon to 4 PM given the current increase in temperatures outside with his known unstable angina.  Significant Procedures: None  Significant Labs and Imaging:  Recent Labs  Lab 06/16/17 1648  WBC 11.4*  HGB 10.9*  HCT 34.1*  PLT 493*   Recent Labs  Lab 06/16/17 1648  NA 140  K 3.8  CL 109  CO2 23  GLUCOSE 155*  BUN 17  CREATININE 1.35*  CALCIUM 8.9  ALKPHOS 88  AST 13*  ALT 12*  ALBUMIN 3.2*    Troponin neg x3 EKG-12 lead with old RBBB, otherwise without ST-changes or t-wave abnormalities  Results/Tests Pending at Time of Discharge: None  Discharge Medications:  Allergies as of 06/17/2017      Reactions   Benadryl [diphenhydramine] Nausea Only   Penicillins Other (See Comments)   Childhood allergy  Has patient had a PCN reaction causing immediate rash, facial/tongue/throat swelling, SOB or lightheadedness with hypotension: Unknown Has patient had a PCN reaction causing severe rash involving mucus membranes or skin necrosis: Unknown Has patient had a PCN reaction that required hospitalization: Unknown Has patient had a PCN reaction occurring within  the last 10 years: No If all of the above answers are "NO", then may proceed with Cephalosporin use.      Medication List    STOP taking these medications    azithromycin 250 MG tablet Commonly known as:  ZITHROMAX   mupirocin ointment 2 % Commonly known as:  BACTROBAN     TAKE these medications   acetaminophen 325 MG tablet Commonly known as:  TYLENOL Take 2 tablets (650 mg total) by mouth every 6 (six) hours as needed for moderate pain or headache.   amLODipine 10 MG tablet Commonly known as:  NORVASC Take 10 mg by mouth daily.   aspirin EC 81 MG tablet Take 81 mg by mouth daily.   atorvastatin 80 MG tablet Commonly known as:  LIPITOR Take 1 tablet (80 mg total) by mouth daily at 6 PM.   diclofenac sodium 1 % Gel Commonly known as:  VOLTAREN Apply 4 g topically 4 (four) times daily.   DULoxetine 60 MG capsule Commonly known as:  CYMBALTA Take 1 capsule (60 mg total) by mouth daily.   gabapentin 300 MG capsule Commonly known as:  NEURONTIN Take 3 capsules (900 mg total) by mouth 3 (three) times daily.   glucose blood test strip Commonly known as:  TRUE METRIX BLOOD GLUCOSE TEST 3 times daily before meals   insulin aspart 100 UNIT/ML FlexPen Commonly known as:  NOVOLOG FLEXPEN Inject 10 Units into the skin 3 (three) times daily with meals.   Insulin Glargine 100 UNIT/ML Solostar Pen Commonly known as:  LANTUS SOLOSTAR Inject 42 Units into the skin daily at 10 pm.   isosorbide mononitrate 30 MG 24 hr tablet Commonly known as:  IMDUR Take 1 tablet (30 mg total) by mouth daily. What changed:  how much to take   lidocaine 5 % Commonly known as:  LIDODERM Place 1 patch onto the skin daily. Remove & Discard patch within 12 hours or as directed by MD   lisinopril 40 MG tablet Commonly known as:  PRINIVIL,ZESTRIL Take 1 tablet (40 mg total) by mouth every morning.   methocarbamol 500 MG tablet Commonly known as:  ROBAXIN Take 1 tablet (500 mg total) by mouth every 8 (eight) hours as needed for muscle spasms.   metoprolol tartrate 100 MG tablet Commonly known as:  LOPRESSOR Take 1 tablet (100 mg total) by mouth 2  (two) times daily.   nitroGLYCERIN 0.4 MG SL tablet Commonly known as:  NITROSTAT Place 1 tablet (0.4 mg total) under the tongue See admin instructions. Place 1 tablet (0.4mg ) under tongue every 5 minutes as needed for chest pain. Call doctor/ 911 if take 2 doses. Max 3/Day   omeprazole 20 MG capsule Commonly known as:  PRILOSEC Take 20 mg by mouth daily.   potassium chloride SA 20 MEQ tablet Commonly known as:  K-DUR,KLOR-CON Take 1 tablet (20 mEq total) by mouth daily.   pregabalin 100 MG capsule Commonly known as:  LYRICA Take 1 capsule (100 mg total) by mouth 2 (two) times daily.   spironolactone 50 MG tablet Commonly known as:  ALDACTONE Take 1 tablet (50 mg total) by mouth daily.   sucralfate 1 g tablet Commonly known as:  CARAFATE Take 1 tablet (1 g total) by mouth 4 (four) times daily -  with meals and at bedtime for 10 days.   ticagrelor 90 MG Tabs tablet Commonly known as:  BRILINTA Take 1 tablet (90 mg total) by mouth 2 (two) times daily.   TRUE  METRIX METER Devi 1 each by Does not apply route 3 (three) times daily before meals.   TRUEPLUS LANCETS 28G Misc 1 each by Does not apply route 3 (three) times daily before meals.            Durable Medical Equipment  (From admission, onward)        Start     Ordered   06/17/17 0000  For home use only DME 4 wheeled rolling walker with seat    Question:  Patient needs a walker to treat with the following condition  Answer:  Angina at rest [facility])   06/17/17 1135      Discharge Instructions: Please refer to Patient Instructions section of EMR for full details.  Patient was counseled important signs and symptoms that should prompt return to medical care, changes in medications, dietary instructions, activity restrictions, and follow up appointments.   Follow-Up Appointments: Follow-up Information    Charlott Rakes, MD. Schedule an appointment as soon as possible for a visit.   Specialty:  Family Medicine Why:   Schedule hospital follow up this week. Contact information: [place] [address] [phone]        Cardiovascular, [place]. Call.   Why:  Make follow up appoitment this week. Contact information: [place] [address] [phone]           Hartleton Bing, DO 06/17/2017, 11:49 AM PGY-2, [place]

## 2017-06-19 ENCOUNTER — Encounter: Payer: Self-pay | Admitting: Family Medicine

## 2017-06-19 ENCOUNTER — Ambulatory Visit: Payer: Self-pay | Attending: Family Medicine | Admitting: Family Medicine

## 2017-06-19 ENCOUNTER — Telehealth: Payer: Self-pay

## 2017-06-19 VITALS — BP 149/88 | HR 82 | Temp 97.8°F | Wt 217.2 lb

## 2017-06-19 DIAGNOSIS — Z955 Presence of coronary angioplasty implant and graft: Secondary | ICD-10-CM | POA: Insufficient documentation

## 2017-06-19 DIAGNOSIS — E1165 Type 2 diabetes mellitus with hyperglycemia: Secondary | ICD-10-CM

## 2017-06-19 DIAGNOSIS — F419 Anxiety disorder, unspecified: Secondary | ICD-10-CM | POA: Insufficient documentation

## 2017-06-19 DIAGNOSIS — Z79899 Other long term (current) drug therapy: Secondary | ICD-10-CM | POA: Insufficient documentation

## 2017-06-19 DIAGNOSIS — I252 Old myocardial infarction: Secondary | ICD-10-CM | POA: Insufficient documentation

## 2017-06-19 DIAGNOSIS — E1149 Type 2 diabetes mellitus with other diabetic neurological complication: Secondary | ICD-10-CM

## 2017-06-19 DIAGNOSIS — I25110 Atherosclerotic heart disease of native coronary artery with unstable angina pectoris: Secondary | ICD-10-CM | POA: Insufficient documentation

## 2017-06-19 DIAGNOSIS — Z888 Allergy status to other drugs, medicaments and biological substances status: Secondary | ICD-10-CM | POA: Insufficient documentation

## 2017-06-19 DIAGNOSIS — Z880 Allergy status to penicillin: Secondary | ICD-10-CM | POA: Insufficient documentation

## 2017-06-19 DIAGNOSIS — Z794 Long term (current) use of insulin: Secondary | ICD-10-CM | POA: Insufficient documentation

## 2017-06-19 DIAGNOSIS — G8929 Other chronic pain: Secondary | ICD-10-CM

## 2017-06-19 DIAGNOSIS — I10 Essential (primary) hypertension: Secondary | ICD-10-CM

## 2017-06-19 DIAGNOSIS — I200 Unstable angina: Secondary | ICD-10-CM

## 2017-06-19 DIAGNOSIS — K219 Gastro-esophageal reflux disease without esophagitis: Secondary | ICD-10-CM | POA: Insufficient documentation

## 2017-06-19 DIAGNOSIS — Z7982 Long term (current) use of aspirin: Secondary | ICD-10-CM | POA: Insufficient documentation

## 2017-06-19 DIAGNOSIS — E1140 Type 2 diabetes mellitus with diabetic neuropathy, unspecified: Secondary | ICD-10-CM | POA: Insufficient documentation

## 2017-06-19 DIAGNOSIS — I255 Ischemic cardiomyopathy: Secondary | ICD-10-CM

## 2017-06-19 LAB — GLUCOSE, POCT (MANUAL RESULT ENTRY): POC Glucose: 186 mg/dl — AB (ref 70–99)

## 2017-06-19 MED ORDER — GABAPENTIN 300 MG PO CAPS: 900.0000 mg | ORAL_CAPSULE | Freq: Three times a day (TID) | ORAL | 3 refills | Status: DC

## 2017-06-19 MED ORDER — FUROSEMIDE 20 MG PO TABS: 20.0000 mg | ORAL_TABLET | Freq: Every day | ORAL | 3 refills | Status: DC

## 2017-06-19 MED ORDER — NITROGLYCERIN 0.4 MG SL SUBL: 0.4000 mg | SUBLINGUAL_TABLET | SUBLINGUAL | 3 refills | Status: DC

## 2017-06-19 MED ORDER — NITROGLYCERIN 0.4 MG SL SUBL
0.4000 mg | SUBLINGUAL_TABLET | SUBLINGUAL | 3 refills | Status: DC
Start: 2017-06-19 — End: 2023-08-30

## 2017-06-19 MED FILL — ISOSORBIDE MN ER 30 MG TAB: 30 | 30 days supply | Qty: 30 | Fill #0

## 2017-06-19 MED FILL — NITROSTAT 0.4 MG TABLET SL: 0.4 | 30 days supply | Qty: 30 | Fill #0

## 2017-06-19 NOTE — Addendum Note (Signed)
Addended byCharlott Rakes on: 06/19/2017 02:56 PM   Modules accepted: Level of Service

## 2017-06-19 NOTE — Telephone Encounter (Signed)
Met with the patient when he was in the clinic today and assisted him with completing a SCAT application which was then faxed to SCAT eligibility.   He forgot to bring his medications with him to the clinic.Message sent to Lisette Abu, RN [place] with an update from his appointment today. The patient was instructed to wait for his medications - nitroglycerin, lasix and gabapentin to be filled by [facility] before leaving the clinic. He was also given a bus pass for his return to [employer].

## 2017-06-19 NOTE — Progress Notes (Signed)
[place]  Date of Telephone Encounter:    Admit Date: 06/16/2017 Discharge Date: 06/17/2017  PCP: Dr Margarita Rana    Subjective:  Patient ID: Aaron Pearson, male    DOB: 28-Oct-1970  Age: 47 y.o. MRN: [id number]  CC: Diabetes   HPI Aaron Pearson is a 47 year old male with a history of type 2 diabetes mellitus (A1c 10.7), hypertension, coronary artery disease (status post multiple prior PCI) with most recent PCI for unstable angina to his left circumflex and LAD on 05/05/17.  Repeat cardiac cath on 05/10/2017 revealed no in stent thrombosis recommendation was for dual antiplatelet therapy with aspirin and Brilinta for 1 year. He was recently hospitalized for precordial chest pain; troponin was less than 0.03 on presentation, EKG revealed normal sinus rhythm, old right bundle branch block, no ST changes. He was treated medically with Nitroglycerin and Morphine and his home medications were continued. Chest pain was thought to be non cardiac and he was discharged with recommendation Cardiology follow up. Prior to this hospitalization he was admitted 06/07/17 - 06/09/17 for precordial chest pain as well.  He presents today and states he is doing well, his chronic pain is stable on Gabapentin but he continues to have intermittent left sided chest pain. He complains of dyspnea on mild exertion and complains of weight gain and pedal edema. He is yet to make an appointment with his Cardiologist. He received a rolling walker at discharge which he states has been very helpful.  Past Medical History:  Diagnosis Date  . Anxiety   . 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 Coronary artery disease   . Diabetes mellitus without complication ([place])   . GERD (gastroesophageal reflux disease)   . Headache   . Hx of diabetic neuropathy   . Hypertension   . Myocardial infarction ([place]) 2018  . Renal disorder     Past Surgical History:  Procedure Laterality Date  . CARDIAC CATHETERIZATION  05/05/2017  .  CARPAL TUNNEL RELEASE    . CORONARY STENT INTERVENTION N/A 05/05/2017   Procedure: CORONARY STENT INTERVENTION;  Surgeon: Nigel Mormon, MD;  Location: [facility];  Service: Cardiovascular;  Laterality: N/A;  . CORONARY STENT PLACEMENT    . INTRAVASCULAR PRESSURE WIRE/FFR STUDY N/A 05/05/2017   Procedure: INTRAVASCULAR PRESSURE WIRE/FFR STUDY;  Surgeon: Nigel Mormon, MD;  Location: [facility];  Service: Cardiovascular;  Laterality: N/A;  . INTRAVASCULAR PRESSURE WIRE/FFR STUDY N/A 05/10/2017   Procedure: INTRAVASCULAR PRESSURE WIRE/FFR STUDY;  Surgeon: Nigel Mormon, MD;  Location: [facility];  Service: Cardiovascular;  Laterality: N/A;  . LEFT HEART CATH AND CORONARY ANGIOGRAPHY N/A 05/05/2017   Procedure: LEFT HEART CATH AND CORONARY ANGIOGRAPHY;  Surgeon: Nigel Mormon, MD;  Location: [facility];  Service: Cardiovascular;  Laterality: N/A;  . LEFT HEART CATH AND CORONARY ANGIOGRAPHY N/A 05/10/2017   Procedure: LEFT HEART CATH AND CORONARY ANGIOGRAPHY;  Surgeon: Nigel Mormon, MD;  Location: [facility];  Service: Cardiovascular;  Laterality: N/A;    Allergies  Allergen Reactions  . Benadryl [Diphenhydramine] Nausea Only  . Penicillins Other (See Comments)    Childhood allergy  Has patient had a PCN reaction causing immediate rash, facial/tongue/throat swelling, SOB or lightheadedness with hypotension: Unknown Has patient had a PCN reaction causing severe rash involving mucus membranes or skin necrosis: Unknown Has patient had a PCN reaction that required hospitalization: Unknown Has patient had a PCN reaction occurring within the  last 10 years: No If all of the above answers are "NO", then may proceed with Cephalosporin use.      Outpatient Medications Prior to Visit  Medication Sig Dispense Refill  . acetaminophen (TYLENOL) 325 MG tablet Take 2 tablets (650 mg total) by mouth every 6 (six) hours as needed for  moderate pain or headache. 90 tablet 3  . amLODipine (NORVASC) 10 MG tablet Take 10 mg by mouth daily.  3  . aspirin EC 81 MG tablet Take 81 mg by mouth daily.    Marland Kitchen atorvastatin (LIPITOR) 80 MG tablet Take 1 tablet (80 mg total) by mouth daily at 6 PM. 30 tablet 3  . Blood Glucose Monitoring Suppl (TRUE METRIX METER) DEVI 1 each by Does not apply route 3 (three) times daily before meals. 1 Device 0  . diclofenac sodium (VOLTAREN) 1 % GEL Apply 4 g topically 4 (four) times daily. 100 g 1  . DULoxetine (CYMBALTA) 60 MG capsule Take 1 capsule (60 mg total) by mouth daily. 30 capsule 3  . glucose blood (TRUE METRIX BLOOD GLUCOSE TEST) test strip 3 times daily before meals 100 each 12  . insulin aspart (NOVOLOG FLEXPEN) 100 UNIT/ML FlexPen Inject 10 Units into the skin 3 (three) times daily with meals. 30 mL 3  . Insulin Glargine (LANTUS SOLOSTAR) 100 UNIT/ML Solostar Pen Inject 42 Units into the skin daily at 10 pm. 5 pen 3  . isosorbide mononitrate (IMDUR) 30 MG 24 hr tablet Take 1 tablet (30 mg total) by mouth daily. 30 tablet 0  . lidocaine (LIDODERM) 5 % Place 1 patch onto the skin daily. Remove & Discard patch within 12 hours or as directed by MD 30 patch 1  . lisinopril (PRINIVIL,ZESTRIL) 40 MG tablet Take 1 tablet (40 mg total) by mouth every morning. 30 tablet 3  . methocarbamol (ROBAXIN) 500 MG tablet Take 1 tablet (500 mg total) by mouth every 8 (eight) hours as needed for muscle spasms. 90 tablet 3  . metoprolol tartrate (LOPRESSOR) 100 MG tablet Take 1 tablet (100 mg total) by mouth 2 (two) times daily. 60 tablet 3  . omeprazole (PRILOSEC) 20 MG capsule Take 20 mg by mouth daily.    . potassium chloride SA (K-DUR,KLOR-CON) 20 MEQ tablet Take 1 tablet (20 mEq total) by mouth daily. 30 tablet 3  . spironolactone (ALDACTONE) 50 MG tablet Take 1 tablet (50 mg total) by mouth daily. 30 tablet 0  . ticagrelor (BRILINTA) 90 MG TABS tablet Take 1 tablet (90 mg total) by mouth 2 (two) times daily.  180 tablet 3  . TRUEPLUS LANCETS 28G MISC 1 each by Does not apply route 3 (three) times daily before meals. 100 each 12  . gabapentin (NEURONTIN) 300 MG capsule Take 3 capsules (900 mg total) by mouth 3 (three) times daily. 270 capsule 0  . nitroGLYCERIN (NITROSTAT) 0.4 MG SL tablet Place 1 tablet (0.4 mg total) under the tongue See admin instructions. Place 1 tablet (0.4mg ) under tongue every 5 minutes as needed for chest pain. Call doctor/ 911 if take 2 doses. Max 3/Day 30 tablet 3  . pregabalin (LYRICA) 100 MG capsule Take 1 capsule (100 mg total) by mouth 2 (two) times daily. (Patient not taking: Reported on 06/19/2017) 180 capsule 3  . sucralfate (CARAFATE) 1 g tablet Take 1 tablet (1 g total) by mouth 4 (four) times daily -  with meals and at bedtime for 10 days. (Patient not taking: Reported on 06/07/2017) 40 tablet 0  No facility-administered medications prior to visit.     ROS Review of Systems  Constitutional: Negative for activity change and appetite change.  HENT: Negative for sinus pressure and sore throat.   Eyes: Negative for visual disturbance.  Respiratory: Positive for shortness of breath. Negative for cough and chest tightness.   Cardiovascular: Negative for chest pain and leg swelling.  Gastrointestinal: Negative for abdominal distention, abdominal pain, constipation and diarrhea.  Endocrine: Negative.   Genitourinary: Negative for dysuria.  Musculoskeletal: Negative for joint swelling and myalgias.  Skin: Negative for rash.  Allergic/Immunologic: Negative.   Neurological: Negative for weakness, light-headedness and numbness.  Psychiatric/Behavioral: Negative for dysphoric mood and suicidal ideas.    Objective:  BP (!) 149/88   Pulse 82   Temp 97.8 F (36.6 C) (Oral)   Wt 217 lb 3.2 oz (98.5 kg)   SpO2 98%   BMI 33.03 kg/m   BP/Weight 06/19/2017 2/0/2542 7/0/6237  Systolic BP 628 315 176  Diastolic BP 88 74 78  Wt. (Lbs) 217.2 213.19 -  BMI 33.03 32.41 -     Wt Readings from Last 3 Encounters:  06/19/17 217 lb 3.2 oz (98.5 kg)  06/17/17 213 lb 3 oz (96.7 kg)  06/09/17 204 lb 1.6 oz (92.6 kg)     Physical Exam  Constitutional: He is oriented to person, place, and time. He appears well-developed and well-nourished.  Neck: No JVD present.  Cardiovascular: Normal rate, normal heart sounds and intact distal pulses.  No murmur heard. Pulmonary/Chest: Effort normal and breath sounds normal. He has no wheezes. He has no rales. He exhibits no tenderness.  Abdominal: Soft. Bowel sounds are normal. He exhibits no distension and no mass. There is no tenderness.  Musculoskeletal: Normal range of motion. He exhibits no edema.  Neurological: He is alert and oriented to person, place, and time.  Skin: Skin is warm and dry.  Psychiatric: He has a normal mood and affect.     CMP Latest Ref Rng & Units 06/16/2017 06/09/2017 06/08/2017  Glucose 65 - 99 mg/dL 155(H) - 115(H)  BUN 6 - 20 mg/dL 17 - 22(H)  Creatinine 0.61 - 1.24 mg/dL 1.35(H) 1.65(H) 1.61(H)  Sodium 135 - 145 mmol/L 140 - 135  Potassium 3.5 - 5.1 mmol/L 3.8 - 4.0  Chloride 101 - 111 mmol/L 109 - 102  CO2 22 - 32 mmol/L 23 - 25  Calcium 8.9 - 10.3 mg/dL 8.9 - 8.3(L)  Total Protein 6.5 - 8.1 g/dL 7.0 - -  Total Bilirubin 0.3 - 1.2 mg/dL 0.3 - -  Alkaline Phos 38 - 126 U/L 88 - -  AST 15 - 41 U/L 13(L) - -  ALT 17 - 63 U/L 12(L) - -    Lab Results  Component Value Date   HGBA1C 10.7 05/16/2017    Assessment & Plan:   1. Type 2 diabetes mellitus with hyperglycemia, without long-term current use of insulin (HCC) Uncontrolled with A1c of 10.7 Blood sugar logs reveal improvement so I will make no regimen changes at this time. Counseled on Diabetic diet, my plate method, 160 minutes of moderate intensity exercise/week Keep blood sugar logs with fasting goals of 80-120 mg/dl, random of less than 180 and in the event of sugars less than 60 mg/dl or greater than 400 mg/dl please notify  the clinic ASAP. It is recommended that you undergo annual eye exams and annual foot exams. Pneumonia vaccine is recommended. - POCT glucose (manual entry)  2. Other chronic pain Stable at  this time Advised him that if pain is uncontrolled we can always switch to Lyrica - gabapentin (NEURONTIN) 300 MG capsule; Take 3 capsules (900 mg total) by mouth 3 (three) times daily.  Dispense: 270 capsule; Refill: 3  3. Unstable angina (HCC) Continues to have intermittent chest pain Keep appointment with cardiologist - nitroGLYCERIN (NITROSTAT) 0.4 MG SL tablet; Place 1 tablet (0.4 mg total) under the tongue See admin instructions. Place 1 tablet (0.4mg ) under tongue every 5 minutes as needed for chest pain. Call doctor/ 911 if take 2 doses. Max 3/Day  Dispense: 30 tablet; Refill: 3  4. Ischemic cardiomyopathy Status post multiple PCI EF of 50 to 16%, grade 3 diastolic dysfunction from echocardiogram of 04/2017 He has gained 5 lbs in the last 2 days States his Lasix was discontinued during hospitalization but I have resumed it at a low dose - furosemide (LASIX) 20 MG tablet; Take 1 tablet (20 mg total) by mouth daily.  Dispense: 30 tablet; Refill: 3  5. Other diabetic neurological complication associated with type 2 diabetes mellitus (HCC) Currently on gabapentin  6. Essential hypertension Uncontrolled blood pressure Blood pressure was normal at last visit so we will make no regimen changes Continue current regimen Counseled on blood pressure goal of less than 130/80, low-sodium, DASH diet, medication compliance, 150 minutes of moderate intensity exercise per week. Discussed medication compliance, adverse effects.   Meds ordered this encounter  Medications  . nitroGLYCERIN (NITROSTAT) 0.4 MG SL tablet    Sig: Place 1 tablet (0.4 mg total) under the tongue See admin instructions. Place 1 tablet (0.4mg ) under tongue every 5 minutes as needed for chest pain. Call doctor/ 911 if take 2 doses. Max  3/Day    Dispense:  30 tablet    Refill:  3  . gabapentin (NEURONTIN) 300 MG capsule    Sig: Take 3 capsules (900 mg total) by mouth 3 (three) times daily.    Dispense:  270 capsule    Refill:  3  . furosemide (LASIX) 20 MG tablet    Sig: Take 1 tablet (20 mg total) by mouth daily.    Dispense:  30 tablet    Refill:  3    Follow-up: Return in about 1 month (around 07/19/2017) for Follow-up of chronic medical conditions.   Charlott Rakes MD

## 2017-06-20 ENCOUNTER — Encounter: Payer: Self-pay | Admitting: Family Medicine

## 2017-06-23 ENCOUNTER — Telehealth (HOSPITAL_COMMUNITY): Payer: Self-pay

## 2017-06-23 NOTE — Telephone Encounter (Signed)
2nd attempt to call patient in regards to Cardiac Rehab and insurance - lm on vm. Sending letter.

## 2017-06-27 ENCOUNTER — Encounter: Payer: Self-pay | Admitting: Pediatric Intensive Care

## 2017-06-27 ENCOUNTER — Telehealth (HOSPITAL_COMMUNITY): Payer: Self-pay

## 2017-06-27 MED FILL — GABAPENTIN 300 MG CAPSULE: 300 | 10 days supply | Qty: 60 | Fill #1

## 2017-06-27 MED FILL — POTASSIUM CL ER 20 MEQ TAB: 20 | 30 days supply | Qty: 30 | Fill #1

## 2017-06-27 NOTE — Telephone Encounter (Signed)
Pt called and stated he does not have medicaid, but does have Chiropractor through [employer]. He will check to see if we may be cover and give [place] a call back.

## 2017-06-30 ENCOUNTER — Encounter (HOSPITAL_COMMUNITY): Payer: Self-pay

## 2017-06-30 ENCOUNTER — Emergency Department (HOSPITAL_COMMUNITY): Payer: Self-pay

## 2017-06-30 ENCOUNTER — Other Ambulatory Visit: Payer: Self-pay

## 2017-06-30 ENCOUNTER — Emergency Department (HOSPITAL_COMMUNITY)
Admission: EM | Admit: 2017-06-30 | Discharge: 2017-07-01 | Disposition: A | Discharge status: Home / Self Care | Payer: Self-pay | Attending: Emergency Medicine | Admitting: Emergency Medicine

## 2017-06-30 DIAGNOSIS — R609 Edema, unspecified: Secondary | ICD-10-CM | POA: Insufficient documentation

## 2017-06-30 DIAGNOSIS — Z79899 Other long term (current) drug therapy: Secondary | ICD-10-CM | POA: Insufficient documentation

## 2017-06-30 DIAGNOSIS — R079 Chest pain, unspecified: Secondary | ICD-10-CM | POA: Insufficient documentation

## 2017-06-30 DIAGNOSIS — I2510 Atherosclerotic heart disease of native coronary artery without angina pectoris: Secondary | ICD-10-CM | POA: Insufficient documentation

## 2017-06-30 DIAGNOSIS — E1140 Type 2 diabetes mellitus with diabetic neuropathy, unspecified: Secondary | ICD-10-CM | POA: Insufficient documentation

## 2017-06-30 DIAGNOSIS — I10 Essential (primary) hypertension: Secondary | ICD-10-CM | POA: Insufficient documentation

## 2017-06-30 DIAGNOSIS — Z794 Long term (current) use of insulin: Secondary | ICD-10-CM | POA: Insufficient documentation

## 2017-06-30 DIAGNOSIS — Z7982 Long term (current) use of aspirin: Secondary | ICD-10-CM | POA: Insufficient documentation

## 2017-06-30 LAB — I-STAT TROPONIN, ED
TROPONIN I, POC: 0.06 ng/mL (ref 0.00–0.08)
Troponin i, poc: 0.08 ng/mL (ref 0.00–0.08)

## 2017-06-30 LAB — CBC
HCT: 35.8 % — ABNORMAL LOW (ref 39.0–52.0)
Hemoglobin: 11.4 g/dL — ABNORMAL LOW (ref 13.0–17.0)
MCH: 28.2 pg (ref 26.0–34.0)
MCHC: 31.8 g/dL (ref 30.0–36.0)
MCV: 88.6 fL (ref 78.0–100.0)
PLATELETS: 410 10*3/uL — AB (ref 150–400)
RBC: 4.04 MIL/uL — ABNORMAL LOW (ref 4.22–5.81)
RDW: 14.8 % (ref 11.5–15.5)
WBC: 10.4 10*3/uL (ref 4.0–10.5)

## 2017-06-30 LAB — BASIC METABOLIC PANEL
Anion gap: 9 (ref 5–15)
BUN: 24 mg/dL — ABNORMAL HIGH (ref 6–20)
CO2: 21 mmol/L — ABNORMAL LOW (ref 22–32)
Calcium: 9.2 mg/dL (ref 8.9–10.3)
Chloride: 108 mmol/L (ref 101–111)
Creatinine, Ser: 1.68 mg/dL — ABNORMAL HIGH (ref 0.61–1.24)
GFR calc Af Amer: 56 mL/min — ABNORMAL LOW (ref >60)
GFR calc non Af Amer: 48 mL/min — ABNORMAL LOW (ref >60)
Glucose, Bld: 110 mg/dL — ABNORMAL HIGH (ref 65–99)
Potassium: 4.1 mmol/L (ref 3.5–5.1)
Sodium: 138 mmol/L (ref 135–145)

## 2017-06-30 MED ORDER — MORPHINE SULFATE (PF) 4 MG/ML IV SOLN
4.0000 mg | Freq: Once | INTRAVENOUS | Status: AC
  Administered 2017-06-30: 4 mg via INTRAVENOUS
  Filled 2017-06-30: qty 1

## 2017-06-30 MED ORDER — NITROGLYCERIN 0.4 MG SL SUBL
0.4000 mg | SUBLINGUAL_TABLET | SUBLINGUAL | Status: DC | PRN
Start: 2017-06-30 — End: 2017-07-01
  Administered 2017-06-30 (×2): 0.4 mg via SUBLINGUAL
  Filled 2017-06-30 (×2): qty 1

## 2017-06-30 NOTE — ED Triage Notes (Addendum)
Pt BIB [facility] from [facility] d/t 10/10 L.Side CP that radiates to L.Arm and L.Leg w/ numbness. Pt has Hx of MI. Pt was given 324 ASA, 0.4 Nitro

## 2017-06-30 NOTE — ED Provider Notes (Signed)
[facility] EMERGENCY DEPARTMENT Provider Note   CSN: [id number] Arrival date & time: 06/30/17  2001     History   Chief Complaint Chief Complaint  Patient presents with  . Chest Pain    HPI Aaron Pearson is a 47 y.o. male.  HPI  The patient is a 47 year old male, he is known to have diabetes, hypertension, has had multiple myocardial infarctions and has multiple stents in his coronary arteries, most recently evaluated at the beginning of May when he had a heart catheterization with an intervention of a stent, his other stents appeared well with minimal in-stent restenosis.  He was then evaluated in June approximately 2 weeks ago where he had chest pain with radiation of the left arm, consistent with what he had in the past as well as what he has today, had negative cardiac enzymes, was referred to cardiology as an outpatient which she still has not yet seen.  The patient states that today he woke up and had pain starting at 5:00 in the morning which has been constant, worse with any small movements of his arm or his body, states that he has some shortness of breath, some nausea, and numbness in his bilateral lower extremities.  These are all similar complaints to what he has had in the past according to the patient he states it is a very similar presentation.  The patient reports that he is currently living in a homeless shelter, he has been there for a month and a half, he no longer lives with his girlfriend who he broke up with a couple months ago.  He denies tobacco use, denies alcohol use, denies swelling of his legs.  He has taken a nitroglycerin prior to arrival and states it did not help.  Past Medical History:  Diagnosis Date  . Anxiety   . 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 Coronary artery disease   . Diabetes mellitus without complication ([place])   . GERD (gastroesophageal reflux disease)   . Headache   . Hx of diabetic neuropathy   . Hypertension   . Myocardial  infarction ([place]) 2018  . Renal disorder     Patient Active Problem List   Diagnosis Date Noted  . Atypical chest pain 06/07/2017  . History of endocarditis   . Ischemic cardiomyopathy 06/06/2017  . Diabetic neuropathy ([place]) 05/29/2017  . Hypertension 05/16/2017  . Coronary artery disease 05/16/2017  . Anxiety and depression 05/16/2017  . Chronic pain 05/16/2017  . GERD (gastroesophageal reflux disease) 05/16/2017  . Type 2 diabetes mellitus ([place]) 05/16/2017  . Unstable angina ([place]) 05/05/2017  . Precordial chest pain 05/04/2017    Past Surgical History:  Procedure Laterality Date  . CARDIAC CATHETERIZATION  05/05/2017  . CARPAL TUNNEL RELEASE    . CORONARY STENT INTERVENTION N/A 05/05/2017   Procedure: CORONARY STENT INTERVENTION;  Surgeon: Nigel Mormon, MD;  Location: [facility];  Service: Cardiovascular;  Laterality: N/A;  . CORONARY STENT PLACEMENT    . INTRAVASCULAR PRESSURE WIRE/FFR STUDY N/A 05/05/2017   Procedure: INTRAVASCULAR PRESSURE WIRE/FFR STUDY;  Surgeon: Nigel Mormon, MD;  Location: [facility];  Service: Cardiovascular;  Laterality: N/A;  . INTRAVASCULAR PRESSURE WIRE/FFR STUDY N/A 05/10/2017   Procedure: INTRAVASCULAR PRESSURE WIRE/FFR STUDY;  Surgeon: Nigel Mormon, MD;  Location: [facility];  Service: Cardiovascular;  Laterality: N/A;  . LEFT HEART CATH AND CORONARY ANGIOGRAPHY N/A 05/05/2017   Procedure: LEFT HEART CATH AND  CORONARY ANGIOGRAPHY;  Surgeon: Nigel Mormon, MD;  Location: [facility];  Service: Cardiovascular;  Laterality: N/A;  . LEFT HEART CATH AND CORONARY ANGIOGRAPHY N/A 05/10/2017   Procedure: LEFT HEART CATH AND CORONARY ANGIOGRAPHY;  Surgeon: Nigel Mormon, MD;  Location: [facility];  Service: Cardiovascular;  Laterality: N/A;        Home Medications    Prior to Admission medications   Medication Sig Start Date End Date Taking? Authorizing Provider  acetaminophen  (TYLENOL) 325 MG tablet Take 2 tablets (650 mg total) by mouth every 6 (six) hours as needed for moderate pain or headache. 05/06/17  Yes Patwardhan, Manish J, MD  amLODipine (NORVASC) 10 MG tablet Take 10 mg by mouth daily. 06/14/17  Yes [provider]  aspirin EC 81 MG tablet Take 81 mg by mouth daily. 04/06/17  Yes [provider]  atorvastatin (LIPITOR) 80 MG tablet Take 1 tablet (80 mg total) by mouth daily at 6 PM. 05/16/17 05/16/18 Yes Newlin, Enobong, MD  Blood Glucose Monitoring Suppl (TRUE METRIX METER) DEVI 1 each by Does not apply route 3 (three) times daily before meals. 05/16/17  Yes Charlott Rakes, MD  diclofenac sodium (VOLTAREN) 1 % GEL Apply 4 g topically 4 (four) times daily. 05/16/17  Yes Charlott Rakes, MD  DULoxetine (CYMBALTA) 60 MG capsule Take 1 capsule (60 mg total) by mouth daily. 05/16/17  Yes Charlott Rakes, MD  furosemide (LASIX) 20 MG tablet Take 1 tablet (20 mg total) by mouth daily. 06/19/17  Yes Charlott Rakes, MD  gabapentin (NEURONTIN) 300 MG capsule Take 3 capsules (900 mg total) by mouth 3 (three) times daily. 06/19/17  Yes Newlin, Charlane Ferretti, MD  glucose blood (TRUE METRIX BLOOD GLUCOSE TEST) test strip 3 times daily before meals 05/16/17  Yes Newlin, Enobong, MD  insulin aspart (NOVOLOG FLEXPEN) 100 UNIT/ML FlexPen Inject 10 Units into the skin 3 (three) times daily with meals. 05/16/17  Yes Charlott Rakes, MD  Insulin Glargine (LANTUS SOLOSTAR) 100 UNIT/ML Solostar Pen Inject 42 Units into the skin daily at 10 pm. 06/06/17  Yes Charlott Rakes, MD  isosorbide mononitrate (IMDUR) 30 MG 24 hr tablet Take 1 tablet (30 mg total) by mouth daily. 06/17/17  Yes [place] Bing, DO  lidocaine (LIDODERM) 5 % Place 1 patch onto the skin daily. Remove & Discard patch within 12 hours or as directed by MD 05/16/17  Yes Charlott Rakes, MD  lisinopril (PRINIVIL,ZESTRIL) 40 MG tablet Take 1 tablet (40 mg total) by mouth every morning. 05/16/17  Yes Charlott Rakes, MD    methocarbamol (ROBAXIN) 500 MG tablet Take 1 tablet (500 mg total) by mouth every 8 (eight) hours as needed for muscle spasms. 06/06/17  Yes Charlott Rakes, MD  metoprolol tartrate (LOPRESSOR) 100 MG tablet Take 1 tablet (100 mg total) by mouth 2 (two) times daily. 05/16/17 06/30/17 Yes Charlott Rakes, MD  nitroGLYCERIN (NITROSTAT) 0.4 MG SL tablet Place 1 tablet (0.4 mg total) under the tongue See admin instructions. Place 1 tablet (0.4mg ) under tongue every 5 minutes as needed for chest pain. Call doctor/ 911 if take 2 doses. Max 3/Day 06/19/17  Yes Charlott Rakes, MD  omeprazole (PRILOSEC) 20 MG capsule Take 20 mg by mouth daily.   Yes [provider]  potassium chloride SA (K-DUR,KLOR-CON) 20 MEQ tablet Take 1 tablet (20 mEq total) by mouth daily. 05/16/17  Yes Charlott Rakes, MD  pregabalin (LYRICA) 100 MG capsule Take 1 capsule (100 mg total) by mouth  2 (two) times daily. 06/15/17  Yes Charlott Rakes, MD  spironolactone (ALDACTONE) 50 MG tablet Take 1 tablet (50 mg total) by mouth daily. 06/10/17  Yes Guadalupe Dawn, MD  sucralfate (CARAFATE) 1 g tablet Take 1 tablet (1 g total) by mouth 4 (four) times daily -  with meals and at bedtime for 10 days. 05/14/17 06/30/17 Yes Duffy Bruce, MD  ticagrelor (BRILINTA) 90 MG TABS tablet Take 1 tablet (90 mg total) by mouth 2 (two) times daily. 05/16/17  Yes Charlott Rakes, MD  TRUEPLUS LANCETS 28G MISC 1 each by Does not apply route 3 (three) times daily before meals. 05/16/17  Yes Charlott Rakes, MD    Family History Family History  Problem Relation Age of Onset  . Coronary artery disease Mother   . Coronary artery disease Father     Social History Social History   Tobacco Use  . Smoking status: Never Smoker  . Smokeless tobacco: Never Used  Substance Use Topics  . Alcohol use: Never    Frequency: Never  . Drug use: Never     Allergies   Benadryl [diphenhydramine] and Penicillins   Review of Systems Review of Systems  All  other systems reviewed and are negative.    Physical Exam Updated Vital Signs BP 116/72 (BP Location: Right Arm)   Pulse 90   Temp 98.2 F (36.8 C) (Oral)   Resp 16   Ht 5\' 8"  (1.727 m)   Wt 97.5 kg (215 lb)   SpO2 98%   BMI 32.69 kg/m   Physical Exam  Constitutional: He appears well-developed and well-nourished. No distress.  HENT:  Head: Normocephalic and atraumatic.  Mouth/Throat: Oropharynx is clear and moist. No oropharyngeal exudate.  Eyes: Pupils are equal, round, and reactive to light. Conjunctivae and EOM are normal. Right eye exhibits no discharge. Left eye exhibits no discharge. No scleral icterus.  Neck: Normal range of motion. Neck supple. No JVD present. No thyromegaly present.  Cardiovascular: Normal rate, regular rhythm, normal heart sounds and intact distal pulses. Exam reveals no gallop and no friction rub.  No murmur heard. Pulmonary/Chest: Effort normal and breath sounds normal. No respiratory distress. He has no wheezes. He has no rales. He exhibits no tenderness.  Abdominal: Soft. Bowel sounds are normal. He exhibits no distension and no mass. There is no tenderness.  Musculoskeletal: Normal range of motion. He exhibits edema. He exhibits no tenderness.  Mild symmetrical scant pitting edema to the bilateral lower extremities  Lymphadenopathy:    He has no cervical adenopathy.  Neurological: He is alert. Coordination normal.  Skin: Skin is warm and dry. No rash noted. No erythema.  Psychiatric: He has a normal mood and affect. His behavior is normal.  Nursing note and vitals reviewed.    ED Treatments / Results  Labs (all labs ordered are listed, but only abnormal results are displayed) Labs Reviewed  BASIC METABOLIC PANEL - Abnormal; Notable for the following components:      Result Value   CO2 21 (*)    Glucose, Bld 110 (*)    BUN 24 (*)    Creatinine, Ser 1.68 (*)    GFR calc non Af Amer 48 (*)    GFR calc Af Amer 56 (*)    All other  components within normal limits  CBC - Abnormal; Notable for the following components:   RBC 4.04 (*)    Hemoglobin 11.4 (*)    HCT 35.8 (*)    Platelets 410 (*)  All other components within normal limits  I-STAT TROPONIN, ED  I-STAT TROPONIN, ED    EKG EKG Interpretation  Date/Time:  Friday June 30 2017 20:04:11 EDT Ventricular Rate:  92 PR Interval:    QRS Duration: 135 QT Interval:  406 QTC Calculation: 503 R Axis:   -30 Text Interpretation:  Sinus rhythm Right bundle branch block Nonspecific T abnormalities, lateral leads since last tracing no significant change Confirmed by Noemi Chapel [phone]) on 06/30/2017 8:06:37 PM   Radiology Dg Chest 2 View  Result Date: 06/30/2017 CLINICAL DATA:  Left-sided chest pain radiating down the left arm since this morning. Shortness of breath. History of hypertension, diabetes, nonsmoker. EXAM: CHEST - 2 VIEW COMPARISON:  06/16/2017 FINDINGS: Shallow inspiration. Linear atelectasis or scarring in the right mid lung and left lung base. No change since prior study. No consolidation or edema. No blunting of costophrenic angles. No pneumothorax. Mediastinal contours appear intact. Heart size and pulmonary vascularity are normal. Coronary artery stents. Degenerative changes in the spine. IMPRESSION: Linear atelectasis or scarring in the right mid lung and left lung base similar to previous study. No evidence of active disease. Electronically Signed   By: Lucienne Capers M.D.   On: 06/30/2017 21:05    Procedures Procedures (including critical care time)  Medications Ordered in ED Medications  nitroGLYCERIN (NITROSTAT) SL tablet 0.4 mg (0.4 mg Sublingual Given 06/30/17 2115)  morphine 4 MG/ML injection 4 mg (4 mg Intravenous Given 06/30/17 2209)     Initial Impression / Assessment and Plan / ED Course  I have reviewed the triage vital signs and the nursing notes.  Pertinent labs & imaging results that were available during my care of the  patient were reviewed by me and considered in my medical decision making (see chart for details).    The patient has chronic pain, he is on Neurontin chronically, he has chronic left-sided chest pain, this is evidenced by multiple notes including his family doctor who he most recently saw in June.  His pain today seems similar to that.  His EKG shows a right bundle branch block and no significant changes from prior.  His known coronary disease will be evaluated with lab work-up.  That being said at this time he has been symptomatic for over 15 hours with nonstop pain.  I would suspect that if this was an ischemic cause of his pain he would have an elevated troponin.  We will give more nitroglycerin, aspirin, the patient is currently taking Brilinta and aspirin.  Chest x-ray as well to rule out other sources such as pneumothorax or pneumonia.  Patient agreeable to the plan.  Interestingly the patient does not want to know if his heart is doing well but wants to know if he can have pain medicine.  The patient has 2- troponins, the patient states that he feels better, with 2- troponins and over 18 hours of symptoms I doubt that the patient is having an acute coronary syndrome.  He has been instructed of his results and the indications for return, he is agreeable.  Final Clinical Impressions(s) / ED Diagnoses   Final diagnoses:  Chest pain, unspecified type    ED Discharge Orders    None       Noemi Chapel, MD 06/30/17 2352

## 2017-06-30 NOTE — Discharge Instructions (Addendum)
Your heart tests were unremarkable tonight however if you should develop increasing pain or any worsening symptoms such as shortness of breath vomiting or excessive sweating return to the emergency department immediately.  Please call your cardiologist in the morning to make an appointment for the next available visit hopefully this week.  Continue to take all of your medications

## 2017-06-30 NOTE — ED Notes (Signed)
Pt requesting pain medication.  

## 2017-07-03 ENCOUNTER — Encounter: Payer: Self-pay | Admitting: Pediatric Intensive Care

## 2017-07-03 ENCOUNTER — Telehealth: Payer: Self-pay

## 2017-07-03 NOTE — Telephone Encounter (Addendum)
Call received from [facility], [place]. She was with the patient and the phone was on speaker. She explained that she is giving him bus passes to come to [facility] on 07/07/17 for labwork. She also noted that he has an appointment with [facility] on 07/06/17 in the am.  She was requesting a letter from Dr Margarita Rana stating that the patient could stay inside the shelter during the afternoon due to his difficulty tolerating the heat outside. This CM explained that Dr Margarita Rana is off this week. [place] stated that she would check with the shelter director to see if he would continue to honor the letter from the hospital at the end of May 2019 noting that the patient would benefit from staying indoors in the afternoon.  She would also like the hospital to consider discharging him to the [facility] program when/if he is admitted again.  [place] also inquired about obtaining a copy of the [employer] letter as he is trying to schedule an appointment with cardiac rehab. He stated that he was given a letter but is not able to find it. This CM to check with Wynonia Hazard, [place] Counselor about obtaining a copy of the letter.   As per Wynonia Hazard, the patient is medicaid pending in Epic and the hospital financial counselor will not review the CAFA application when medicaid is pending. He stated that the patient will need to call the hospital financial counselor to discuss as he is not currently eligible for CAFA.   Update provided to V. Red Christians, [place]

## 2017-07-06 MED FILL — METHOCARBAMOL 500 MG TABS: 500 | 30 days supply | Qty: 90 | Fill #1

## 2017-07-06 MED FILL — ISOSORBIDE MN ER 30 MG TAB: 30 | 30 days supply | Qty: 15 | Fill #1

## 2017-07-06 MED FILL — !BRILINTA 90 MG TABLET: 30 days supply | Qty: 60 | Fill #2

## 2017-07-06 MED FILL — ?FUROSEMIDE 20 MG TABLET: 20 | 30 days supply | Qty: 90 | Fill #1

## 2017-07-07 ENCOUNTER — Telehealth: Payer: Self-pay | Admitting: Family Medicine

## 2017-07-07 ENCOUNTER — Ambulatory Visit: Payer: Self-pay | Attending: Family Medicine

## 2017-07-07 DIAGNOSIS — E1169 Type 2 diabetes mellitus with other specified complication: Secondary | ICD-10-CM | POA: Insufficient documentation

## 2017-07-07 DIAGNOSIS — Z794 Long term (current) use of insulin: Secondary | ICD-10-CM | POA: Insufficient documentation

## 2017-07-07 NOTE — Telephone Encounter (Signed)
Patient came to the office for lab work and to also speak with Opal Sidles, Tourist information centre manager. Patient was informed that Opal Sidles was not in the office and therefore I, transitional care coordinator, spoke with patient. Patient came to request a copy of [facility]. I informed patient that there was no letter on Epic but that I would check about the [facility]. Patient then stated that he would be in the pharmacy line picking up his medication, while I checked on CAFA letter and orange card.   Spoke with Alroy Dust., financial counselor, and she stated that patient never applied for either CAFA or [place]. A Medicaid application is currently pending.   When I returned to the lobby to give patient update, he was gone.  I asked Trinidad Curet, clinical pharmacist, if patient had picked up any medications and [name] said that patient had picked up four medications yesterday but none today.   Call placed to patient [phone], to give him an update on CAFA. No answer. Left patient a message asking him for a call back at [phone].

## 2017-07-07 NOTE — Progress Notes (Signed)
Patient here for lab visit only 

## 2017-07-08 LAB — CMP14+EGFR
A/G RATIO: 1.3 (ref 1.2–2.2)
ALK PHOS: 105 IU/L (ref 39–117)
ALT: 10 IU/L (ref 0–44)
AST: 13 IU/L (ref 0–40)
Albumin: 4 g/dL (ref 3.5–5.5)
BUN / CREAT RATIO: 11 (ref 9–20)
BUN: 15 mg/dL (ref 6–24)
Bilirubin Total: 0.4 mg/dL (ref 0.0–1.2)
CO2: 20 mmol/L (ref 20–29)
Calcium: 9.5 mg/dL (ref 8.7–10.2)
Chloride: 105 mmol/L (ref 96–106)
Creatinine, Ser: 1.32 mg/dL — ABNORMAL HIGH (ref 0.76–1.27)
GFR calc Af Amer: 75 mL/min/{1.73_m2} (ref >59)
GFR calc non Af Amer: 65 mL/min/{1.73_m2} (ref >59)
GLOBULIN, TOTAL: 3.1 g/dL (ref 1.5–4.5)
Glucose: 190 mg/dL — ABNORMAL HIGH (ref 65–99)
POTASSIUM: 4.6 mmol/L (ref 3.5–5.2)
SODIUM: 138 mmol/L (ref 134–144)
Total Protein: 7.1 g/dL (ref 6.0–8.5)

## 2017-07-10 ENCOUNTER — Telehealth: Payer: Self-pay

## 2017-07-10 NOTE — Telephone Encounter (Signed)
Call received from the patient. He stated that he is coming to the clinic tomorrow and would like to know if he can get " copies of papers" about his financial assistance from [employer],  [facility]. Informed him that Clifton James does not have any paperwork to give him.  He needs to call [place] Counselor to discuss the status of the CAFA and medicaid applications. His CAFA application will not be processed with a pending medicaid application. He stated that he understood and would be at the clinic tomorrow.

## 2017-07-10 NOTE — Telephone Encounter (Signed)
This CM spoke to [place], [place] [facility] and informed her that the patient will need to call [place] office to discuss the status of his [place] application and his pending medicaid application. She noted that she will be in the clinic at [facility] and will advise him to call if she sees him.

## 2017-07-11 ENCOUNTER — Encounter: Payer: Self-pay | Admitting: Pediatric Intensive Care

## 2017-07-11 ENCOUNTER — Telehealth: Payer: Self-pay

## 2017-07-11 ENCOUNTER — Ambulatory Visit: Payer: Self-pay | Admitting: Family Medicine

## 2017-07-11 NOTE — Congregational Nurse Program (Signed)
Congregational Nurse Program Note  Date of Encounter: 07/11/2017  Past Medical History: Past Medical History:  Diagnosis Date  . Anxiety   . 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 Coronary artery disease   . Diabetes mellitus without complication ([place])   . GERD (gastroesophageal reflux disease)   . Headache   . Hx of diabetic neuropathy   . Hypertension   . Myocardial infarction ([place]) 2018  . Renal disorder     Encounter Details: CNP Questionnaire - 07/11/17 0930      Questionnaire   Patient Status  Not Applicable    Race  White or Caucasian    Location Patient Served At  [employer]  Not Applicable    Uninsured  Uninsured (NEW 1x/quarter)    Food  No food insecurities    Housing/Utilities  No permanent housing    Transportation  Yes, need transportation assistance    Interpersonal Safety  Yes, feel physically and emotionally safe where you currently live    Medication  Yes, have medication insecurities    Medical Provider  Yes    Referrals  Primary Care Provider/Clinic;Other    ED Visit Averted  Not Applicable    Life-Saving Intervention Made  Not Applicable     Client in for BP check. Client states he wants to cancel tomorrow's clinic appointment. Cn spoke with client regarding Orange Card status and relayed message from [facility] that he needs to speak with CFA regarding status. Client states that he does not feel well today and that he is disturbed by actions of some of the guests at the shelter. CN gave client space to vent however she states that this encounter is for the client and she is not commenting on other guests' behaviors. CN reminded client that he can see the [facility] nurse at the [facility]. Client has 2+ edema on lower extremities and his pulses are bounding in upper extremities. CN recommends that he go to appointment tomorrow but CN will contact Eden Lathe at [facility] for advice.He endorses taking medication as prescribed. Client will follow up in clinic on Monday. CN advised ED if  chest pain, weakness or increased swelling.

## 2017-07-11 NOTE — Telephone Encounter (Signed)
Call received from [place]. She reported that the patient has 2+ edema of his lower extremities. BP 110/82 . He reports compliance with his medication regime.  He said that he wants to cancel his appointment at [facility] for tomorrow.  Informed [place] that his appointment was this morning and he did not show up.  This information to be shared with Dr Margarita Rana. Jordan Hawks will see the patient next Monday, 07/17/17 and the patient has an appointment with Dr Margarita Rana  07/19/17.   [place] also noted that the patient is inquiring about papers that are needed for his [employer] . Informed her that this CM spoke to him yesterday about this and he has been instructed to call [place] as her [facility] Financial Counselors.  There is no specific financial counselor that he needs to ask for when calling. [place] stated that she would remind him to call Financial Counseling and also review when to return to the ED.

## 2017-07-14 ENCOUNTER — Other Ambulatory Visit: Payer: Self-pay

## 2017-07-14 ENCOUNTER — Emergency Department (HOSPITAL_COMMUNITY): Payer: Medicaid Other

## 2017-07-14 ENCOUNTER — Encounter (HOSPITAL_COMMUNITY): Payer: Self-pay | Admitting: Emergency Medicine

## 2017-07-14 ENCOUNTER — Observation Stay (HOSPITAL_COMMUNITY)
Admission: EM | Admit: 2017-07-14 | Discharge: 2017-07-15 | Disposition: A | Discharge status: Home / Self Care | Payer: Medicaid Other | Attending: Cardiology | Admitting: Cardiology

## 2017-07-14 ENCOUNTER — Telehealth: Payer: Self-pay

## 2017-07-14 DIAGNOSIS — R7989 Other specified abnormal findings of blood chemistry: Secondary | ICD-10-CM | POA: Insufficient documentation

## 2017-07-14 DIAGNOSIS — R0789 Other chest pain: Principal | ICD-10-CM | POA: Insufficient documentation

## 2017-07-14 DIAGNOSIS — Z79899 Other long term (current) drug therapy: Secondary | ICD-10-CM | POA: Diagnosis not present

## 2017-07-14 DIAGNOSIS — Z7982 Long term (current) use of aspirin: Secondary | ICD-10-CM | POA: Insufficient documentation

## 2017-07-14 DIAGNOSIS — I2510 Atherosclerotic heart disease of native coronary artery without angina pectoris: Secondary | ICD-10-CM | POA: Diagnosis not present

## 2017-07-14 DIAGNOSIS — Z955 Presence of coronary angioplasty implant and graft: Secondary | ICD-10-CM | POA: Insufficient documentation

## 2017-07-14 DIAGNOSIS — R079 Chest pain, unspecified: Secondary | ICD-10-CM | POA: Diagnosis present

## 2017-07-14 DIAGNOSIS — I10 Essential (primary) hypertension: Secondary | ICD-10-CM | POA: Insufficient documentation

## 2017-07-14 DIAGNOSIS — E119 Type 2 diabetes mellitus without complications: Secondary | ICD-10-CM | POA: Insufficient documentation

## 2017-07-14 DIAGNOSIS — I252 Old myocardial infarction: Secondary | ICD-10-CM | POA: Insufficient documentation

## 2017-07-14 DIAGNOSIS — Z794 Long term (current) use of insulin: Secondary | ICD-10-CM | POA: Diagnosis not present

## 2017-07-14 DIAGNOSIS — E782 Mixed hyperlipidemia: Secondary | ICD-10-CM | POA: Insufficient documentation

## 2017-07-14 DIAGNOSIS — R778 Other specified abnormalities of plasma proteins: Secondary | ICD-10-CM

## 2017-07-14 DIAGNOSIS — I200 Unstable angina: Secondary | ICD-10-CM

## 2017-07-14 LAB — CBC
HCT: 33.7 % — ABNORMAL LOW (ref 39.0–52.0)
Hemoglobin: 10.9 g/dL — ABNORMAL LOW (ref 13.0–17.0)
MCH: 28.8 pg (ref 26.0–34.0)
MCHC: 32.3 g/dL (ref 30.0–36.0)
MCV: 89.2 fL (ref 78.0–100.0)
PLATELETS: 441 10*3/uL — AB (ref 150–400)
RBC: 3.78 MIL/uL — ABNORMAL LOW (ref 4.22–5.81)
RDW: 14.7 % (ref 11.5–15.5)
WBC: 10.5 10*3/uL (ref 4.0–10.5)

## 2017-07-14 LAB — I-STAT TROPONIN, ED
TROPONIN I, POC: 0.27 ng/mL — AB (ref 0.00–0.08)
Troponin i, poc: 0.34 ng/mL (ref 0.00–0.08)

## 2017-07-14 LAB — BASIC METABOLIC PANEL
Anion gap: 10 (ref 5–15)
BUN: 17 mg/dL (ref 6–20)
CALCIUM: 8.8 mg/dL — AB (ref 8.9–10.3)
CHLORIDE: 108 mmol/L (ref 98–111)
CO2: 21 mmol/L — AB (ref 22–32)
CREATININE: 1.53 mg/dL — AB (ref 0.61–1.24)
GFR calc Af Amer: >60 mL/min (ref >60)
GFR calc non Af Amer: 54 mL/min — ABNORMAL LOW (ref >60)
GLUCOSE: 175 mg/dL — AB (ref 70–99)
Potassium: 4.3 mmol/L (ref 3.5–5.1)
Sodium: 139 mmol/L (ref 135–145)

## 2017-07-14 MED ORDER — MORPHINE SULFATE (PF) 4 MG/ML IV SOLN
4.0000 mg | Freq: Once | INTRAVENOUS | Status: AC
  Administered 2017-07-14: 4 mg via INTRAVENOUS
  Filled 2017-07-14: qty 1

## 2017-07-14 MED ORDER — HEPARIN BOLUS VIA INFUSION
4000.0000 [IU] | Freq: Once | INTRAVENOUS | Status: AC
  Administered 2017-07-14: 4000 [IU] via INTRAVENOUS
  Filled 2017-07-14: qty 4000

## 2017-07-14 MED ORDER — POTASSIUM CHLORIDE CRYS ER 20 MEQ PO TBCR
20.0000 meq | EXTENDED_RELEASE_TABLET | Freq: Every day | ORAL | Status: DC
  Administered 2017-07-15: 20 meq via ORAL
  Filled 2017-07-14: qty 1

## 2017-07-14 MED ORDER — ASPIRIN EC 81 MG PO TBEC
81.0000 mg | DELAYED_RELEASE_TABLET | Freq: Every day | ORAL | Status: DC
  Administered 2017-07-15: 81 mg via ORAL
  Filled 2017-07-14: qty 1

## 2017-07-14 MED ORDER — INSULIN GLARGINE 100 UNIT/ML ~~LOC~~ SOLN
42.0000 [IU] | Freq: Every day | SUBCUTANEOUS | Status: DC
  Administered 2017-07-15: 42 [IU] via SUBCUTANEOUS
  Filled 2017-07-14 (×2): qty 0.42

## 2017-07-14 MED ORDER — HEPARIN (PORCINE) IN NACL 100-0.45 UNIT/ML-% IJ SOLN
1350.0000 [IU]/h | INTRAMUSCULAR | Status: DC
Start: 2017-07-14 — End: 2017-07-15
  Administered 2017-07-14: 1200 [IU]/h via INTRAVENOUS
  Filled 2017-07-14: qty 250

## 2017-07-14 MED ORDER — ACETAMINOPHEN 325 MG PO TABS
650.0000 mg | ORAL_TABLET | ORAL | Status: DC | PRN
  Administered 2017-07-15: 650 mg via ORAL
  Filled 2017-07-14: qty 2

## 2017-07-14 MED ORDER — PANTOPRAZOLE SODIUM 40 MG PO TBEC
40.0000 mg | DELAYED_RELEASE_TABLET | Freq: Every day | ORAL | Status: DC
  Administered 2017-07-15: 40 mg via ORAL
  Filled 2017-07-14: qty 1

## 2017-07-14 MED ORDER — INSULIN ASPART 100 UNIT/ML FLEXPEN
10.0000 [IU] | PEN_INJECTOR | Freq: Three times a day (TID) | SUBCUTANEOUS | Status: DC
  Filled 2017-07-14: qty 3

## 2017-07-14 MED ORDER — SPIRONOLACTONE 50 MG PO TABS
50.0000 mg | ORAL_TABLET | Freq: Every day | ORAL | Status: DC
Start: 2017-07-15 — End: 2017-07-15
  Administered 2017-07-15: 50 mg via ORAL
  Filled 2017-07-14: qty 2
  Filled 2017-07-14: qty 1

## 2017-07-14 MED ORDER — ASPIRIN 81 MG PO CHEW
324.0000 mg | CHEWABLE_TABLET | ORAL | Status: AC
  Administered 2017-07-15: 324 mg via ORAL
  Filled 2017-07-14: qty 4

## 2017-07-14 MED ORDER — TRUE METRIX METER DEVI: 1.0000 | Freq: Three times a day (TID) | Status: DC

## 2017-07-14 MED ORDER — DULOXETINE HCL 60 MG PO CPEP
60.0000 mg | ORAL_CAPSULE | Freq: Every day | ORAL | Status: DC
  Administered 2017-07-15: 60 mg via ORAL
  Filled 2017-07-14: qty 1

## 2017-07-14 MED ORDER — NITROGLYCERIN 0.4 MG SL SUBL
0.4000 mg | SUBLINGUAL_TABLET | SUBLINGUAL | Status: DC | PRN
Start: 2017-07-14 — End: 2017-07-15
  Administered 2017-07-15 (×3): 0.4 mg via SUBLINGUAL
  Filled 2017-07-14: qty 1

## 2017-07-14 MED ORDER — TICAGRELOR 90 MG PO TABS
90.0000 mg | ORAL_TABLET | Freq: Two times a day (BID) | ORAL | Status: DC
  Administered 2017-07-15 (×2): 90 mg via ORAL
  Filled 2017-07-14 (×2): qty 1

## 2017-07-14 MED ORDER — TRUEPLUS LANCETS 28G MISC: 1.0000 | Freq: Three times a day (TID) | Status: DC

## 2017-07-14 MED ORDER — HEPARIN SODIUM (PORCINE) 5000 UNIT/ML IJ SOLN: 5000.0000 [IU] | Freq: Three times a day (TID) | INTRAMUSCULAR | Status: DC

## 2017-07-14 MED ORDER — LISINOPRIL 40 MG PO TABS
40.0000 mg | ORAL_TABLET | Freq: Every morning | ORAL | Status: DC
  Administered 2017-07-15: 40 mg via ORAL
  Filled 2017-07-14: qty 1

## 2017-07-14 MED ORDER — GABAPENTIN 300 MG PO CAPS
900.0000 mg | ORAL_CAPSULE | Freq: Three times a day (TID) | ORAL | Status: DC
  Administered 2017-07-15: 900 mg via ORAL
  Filled 2017-07-14: qty 3

## 2017-07-14 MED ORDER — ASPIRIN 300 MG RE SUPP: 300.0000 mg | RECTAL | Status: AC

## 2017-07-14 MED ORDER — PREGABALIN 50 MG PO CAPS
100.0000 mg | ORAL_CAPSULE | Freq: Two times a day (BID) | ORAL | Status: DC
  Administered 2017-07-15: 100 mg via ORAL
  Filled 2017-07-14: qty 2

## 2017-07-14 MED ORDER — METHOCARBAMOL 500 MG PO TABS
500.0000 mg | ORAL_TABLET | Freq: Three times a day (TID) | ORAL | Status: DC | PRN
  Administered 2017-07-15: 500 mg via ORAL
  Filled 2017-07-14: qty 1

## 2017-07-14 MED ORDER — AMLODIPINE BESYLATE 5 MG PO TABS
10.0000 mg | ORAL_TABLET | Freq: Every day | ORAL | Status: DC
  Administered 2017-07-15: 10 mg via ORAL
  Filled 2017-07-14: qty 2

## 2017-07-14 MED ORDER — FUROSEMIDE 20 MG PO TABS
20.0000 mg | ORAL_TABLET | Freq: Every day | ORAL | Status: DC
  Administered 2017-07-15: 20 mg via ORAL
  Filled 2017-07-14: qty 1

## 2017-07-14 MED ORDER — ONDANSETRON HCL 4 MG/2ML IJ SOLN: 4.0000 mg | Freq: Four times a day (QID) | INTRAMUSCULAR | Status: DC | PRN

## 2017-07-14 MED ORDER — SUCRALFATE 1 G PO TABS
1.0000 g | ORAL_TABLET | Freq: Three times a day (TID) | ORAL | Status: DC
  Administered 2017-07-15: 1 g via ORAL
  Filled 2017-07-14: qty 1

## 2017-07-14 MED ORDER — ISOSORBIDE MONONITRATE ER 30 MG PO TB24
30.0000 mg | ORAL_TABLET | Freq: Every day | ORAL | Status: DC
  Administered 2017-07-15: 30 mg via ORAL
  Filled 2017-07-14: qty 1

## 2017-07-14 MED ORDER — ATORVASTATIN CALCIUM 80 MG PO TABS: 80.0000 mg | ORAL_TABLET | Freq: Every day | ORAL | Status: DC

## 2017-07-14 MED ORDER — METOPROLOL TARTRATE 100 MG PO TABS
100.0000 mg | ORAL_TABLET | Freq: Two times a day (BID) | ORAL | Status: DC
  Administered 2017-07-15 (×2): 100 mg via ORAL
  Filled 2017-07-14: qty 4
  Filled 2017-07-14: qty 1

## 2017-07-14 MED ORDER — INSULIN ASPART 100 UNIT/ML ~~LOC~~ SOLN
10.0000 [IU] | Freq: Three times a day (TID) | SUBCUTANEOUS | Status: DC
  Administered 2017-07-15: 10 [IU] via SUBCUTANEOUS

## 2017-07-14 NOTE — ED Provider Notes (Signed)
[facility] EMER[facility] DEPARTMENT Provider Note   CSN: [id number] Arrival date & time: 07/14/17  1851     History   Chief Complaint Chief Complaint  Patient presents with  . Chest Pain    HPI Aaron Pearson is a 47 y.o. male.  He has a history of coronary disease multiple MIs.  His cardiologist is Dr. Shanon Brow.  He is presenting today with substernal chest pain severe in nature rating down his left arm that was acute in onset at 5 PM today while at rest.  Associated with shortness of breath nausea dizziness.  These are typical of the patient's presentations to the ED and he says he gets this pain every few weeks.  Patient has multiple cardiac interventions in the past.  He currently is living in a shelter and he said he took his regular medications.  He also states that EMS gave him aspirin and nitro prior to arrival without any relief.  He states that morphine usually helps his pain.  The history is provided by the patient.  Chest Pain   This is a recurrent problem. The current episode started 1 to 2 hours ago. The problem occurs constantly. The problem has not changed since onset.The pain is present in the substernal region. The pain is severe. The quality of the pain is described as heavy. The pain radiates to the left arm. Associated symptoms include diaphoresis, lower extremity edema, nausea and shortness of breath. Pertinent negatives include no abdominal pain, no back pain, no headaches and no syncope. He has tried nitroglycerin for the symptoms. The treatment provided no relief. Risk factors include male gender.  His past medical history is significant for CAD and diabetes.  Pertinent negatives for past medical history include no seizures.  Procedure history is positive for cardiac catheterization.    Past Medical History:  Diagnosis Date  . Anxiety   . 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 Coronary artery disease   . Diabetes mellitus without complication ([place])   .  GERD (gastroesophageal reflux disease)   . Headache   . Hx of diabetic neuropathy   . Hypertension   . Myocardial infarction ([place]) 2018  . Renal disorder     Patient Active Problem List   Diagnosis Date Noted  . Atypical chest pain 06/07/2017  . History of endocarditis   . Ischemic cardiomyopathy 06/06/2017  . Diabetic neuropathy ([place]) 05/29/2017  . Hypertension 05/16/2017  . Coronary artery disease 05/16/2017  . Anxiety and depression 05/16/2017  . Chronic pain 05/16/2017  . GERD (gastroesophageal reflux disease) 05/16/2017  . Type 2 diabetes mellitus ([place]) 05/16/2017  . Unstable angina ([place]) 05/05/2017  . Precordial chest pain 05/04/2017    Past Surgical History:  Procedure Laterality Date  . CARDIAC CATHETERIZATION  05/05/2017  . CARPAL TUNNEL RELEASE    . CORONARY STENT INTERVENTION N/A 05/05/2017   Procedure: CORONARY STENT INTERVENTION;  Surgeon: Nigel Mormon, MD;  Location: [facility];  Service: Cardiovascular;  Laterality: N/A;  . CORONARY STENT PLACEMENT    . INTRAVASCULAR PRESSURE WIRE/FFR STUDY N/A 05/05/2017   Procedure: INTRAVASCULAR PRESSURE WIRE/FFR STUDY;  Surgeon: Nigel Mormon, MD;  Location: [facility];  Service: Cardiovascular;  Laterality: N/A;  . INTRAVASCULAR PRESSURE WIRE/FFR STUDY N/A 05/10/2017   Procedure: INTRAVASCULAR PRESSURE WIRE/FFR STUDY;  Surgeon: Nigel Mormon, MD;  Location: [facility];  Service: Cardiovascular;  Laterality: N/A;  . LEFT HEART CATH AND CORONARY ANGIOGRAPHY N/A  05/05/2017   Procedure: LEFT HEART CATH AND CORONARY ANGIOGRAPHY;  Surgeon: Nigel Mormon, MD;  Location: [facility];  Service: Cardiovascular;  Laterality: N/A;  . LEFT HEART CATH AND CORONARY ANGIOGRAPHY N/A 05/10/2017   Procedure: LEFT HEART CATH AND CORONARY ANGIOGRAPHY;  Surgeon: Nigel Mormon, MD;  Location: [facility];  Service: Cardiovascular;  Laterality: N/A;        Home Medications     Prior to Admission medications   Medication Sig Start Date End Date Taking? Authorizing Provider  acetaminophen (TYLENOL) 325 MG tablet Take 2 tablets (650 mg total) by mouth every 6 (six) hours as needed for moderate pain or headache. 05/06/17   Patwardhan, Manish J, MD  amLODipine (NORVASC) 10 MG tablet Take 10 mg by mouth daily. 06/14/17   [provider]  aspirin EC 81 MG tablet Take 81 mg by mouth daily. 04/06/17   [provider]  atorvastatin (LIPITOR) 80 MG tablet Take 1 tablet (80 mg total) by mouth daily at 6 PM. 05/16/17 05/16/18  Charlott Rakes, MD  Blood Glucose Monitoring Suppl (TRUE METRIX METER) DEVI 1 each by Does not apply route 3 (three) times daily before meals. 05/16/17   Charlott Rakes, MD  diclofenac sodium (VOLTAREN) 1 % GEL Apply 4 g topically 4 (four) times daily. 05/16/17   Charlott Rakes, MD  DULoxetine (CYMBALTA) 60 MG capsule Take 1 capsule (60 mg total) by mouth daily. 05/16/17   Charlott Rakes, MD  furosemide (LASIX) 20 MG tablet Take 1 tablet (20 mg total) by mouth daily. 06/19/17   Charlott Rakes, MD  gabapentin (NEURONTIN) 300 MG capsule Take 3 capsules (900 mg total) by mouth 3 (three) times daily. 06/19/17   Charlott Rakes, MD  glucose blood (TRUE METRIX BLOOD GLUCOSE TEST) test strip 3 times daily before meals 05/16/17   Newlin, Enobong, MD  insulin aspart (NOVOLOG FLEXPEN) 100 UNIT/ML FlexPen Inject 10 Units into the skin 3 (three) times daily with meals. 05/16/17   Charlott Rakes, MD  Insulin Glargine (LANTUS SOLOSTAR) 100 UNIT/ML Solostar Pen Inject 42 Units into the skin daily at 10 pm. 06/06/17   Charlott Rakes, MD  isosorbide mononitrate (IMDUR) 30 MG 24 hr tablet Take 1 tablet (30 mg total) by mouth daily. 06/17/17   [place] Bing, DO  lidocaine (LIDODERM) 5 % Place 1 patch onto the skin daily. Remove & Discard patch within 12 hours or as directed by MD 05/16/17   Charlott Rakes, MD  lisinopril (PRINIVIL,ZESTRIL) 40 MG tablet Take 1 tablet (40 mg  total) by mouth every morning. 05/16/17   Charlott Rakes, MD  methocarbamol (ROBAXIN) 500 MG tablet Take 1 tablet (500 mg total) by mouth every 8 (eight) hours as needed for muscle spasms. 06/06/17   Charlott Rakes, MD  metoprolol tartrate (LOPRESSOR) 100 MG tablet Take 1 tablet (100 mg total) by mouth 2 (two) times daily. 05/16/17 06/30/17  Charlott Rakes, MD  nitroGLYCERIN (NITROSTAT) 0.4 MG SL tablet Place 1 tablet (0.4 mg total) under the tongue See admin instructions. Place 1 tablet (0.4mg ) under tongue every 5 minutes as needed for chest pain. Call doctor/ 911 if take 2 doses. Max 3/Day 06/19/17   Charlott Rakes, MD  omeprazole (PRILOSEC) 20 MG capsule Take 20 mg by mouth daily.    [provider]  potassium chloride SA (K-DUR,KLOR-CON) 20 MEQ tablet Take 1 tablet (20 mEq total) by mouth daily. 05/16/17   Charlott Rakes, MD  pregabalin (LYRICA) 100 MG capsule Take  1 capsule (100 mg total) by mouth 2 (two) times daily. 06/15/17   Charlott Rakes, MD  spironolactone (ALDACTONE) 50 MG tablet Take 1 tablet (50 mg total) by mouth daily. 06/10/17   Guadalupe Dawn, MD  sucralfate (CARAFATE) 1 g tablet Take 1 tablet (1 g total) by mouth 4 (four) times daily -  with meals and at bedtime for 10 days. 05/14/17 06/30/17  Duffy Bruce, MD  ticagrelor (BRILINTA) 90 MG TABS tablet Take 1 tablet (90 mg total) by mouth 2 (two) times daily. 05/16/17   Charlott Rakes, MD  TRUEPLUS LANCETS 28G MISC 1 each by Does not apply route 3 (three) times daily before meals. 05/16/17   Charlott Rakes, MD    Family History Family History  Problem Relation Age of Onset  . Coronary artery disease Mother   . Coronary artery disease Father     Social History Social History   Tobacco Use  . Smoking status: Never Smoker  . Smokeless tobacco: Never Used  Substance Use Topics  . Alcohol use: Never    Frequency: Never  . Drug use: Never     Allergies   Benadryl [diphenhydramine] and Penicillins   Review of  Systems Review of Systems  Constitutional: Positive for diaphoresis.  HENT: Negative for rhinorrhea and sore throat.   Eyes: Negative for visual disturbance.  Respiratory: Positive for shortness of breath.   Cardiovascular: Positive for chest pain and leg swelling. Negative for syncope.  Gastrointestinal: Positive for nausea. Negative for abdominal pain.  Genitourinary: Negative for dysuria and frequency.  Musculoskeletal: Negative for back pain.  Skin: Negative for rash.  Neurological: Negative for seizures and headaches.     Physical Exam Updated Vital Signs BP 124/76 (BP Location: Right Arm)   Pulse 96   Temp 98.5 F (36.9 C) (Oral)   Resp 18   Ht 5\' 8"  (1.727 m)   Wt 97.5 kg (215 lb)   SpO2 94%   BMI 32.69 kg/m   Physical Exam  Constitutional: He appears well-developed and well-nourished.  HENT:  Head: Normocephalic and atraumatic.  Eyes: Pupils are equal, round, and reactive to light. Conjunctivae and EOM are normal.  Neck: Normal range of motion. Neck supple.  Cardiovascular: Normal rate, regular rhythm and normal pulses.  No murmur heard. Pulmonary/Chest: Effort normal and breath sounds normal. No respiratory distress.  Abdominal: Soft. There is no tenderness.  Musculoskeletal: Normal range of motion.       Right lower leg: He exhibits edema.       Left lower leg: He exhibits edema (1+ edema bilateral).  Neurological: He is alert.  Skin: Skin is warm and dry. Capillary refill takes less than 2 seconds.  Psychiatric: He has a normal mood and affect.  Nursing note and vitals reviewed.    ED Treatments / Results  Labs (all labs ordered are listed, but only abnormal results are displayed) Labs Reviewed  BASIC METABOLIC PANEL - Abnormal; Notable for the following components:      Result Value   CO2 21 (*)    Glucose, Bld 175 (*)    Creatinine, Ser 1.53 (*)    Calcium 8.8 (*)    GFR calc non Af Amer 54 (*)    All other components within normal limits  CBC  - Abnormal; Notable for the following components:   RBC 3.78 (*)    Hemoglobin 10.9 (*)    HCT 33.7 (*)    Platelets 441 (*)    All other components within  normal limits  HEPARIN LEVEL (UNFRACTIONATED) - Abnormal; Notable for the following components:   Heparin Unfractionated 0.26 (*)    All other components within normal limits  CBC - Abnormal; Notable for the following components:   RBC 3.73 (*)    Hemoglobin 10.4 (*)    HCT 33.1 (*)    Platelets 416 (*)    All other components within normal limits  CBC - Abnormal; Notable for the following components:   RBC 3.70 (*)    Hemoglobin 10.5 (*)    HCT 32.9 (*)    Platelets 417 (*)    All other components within normal limits  CREATININE, SERUM - Abnormal; Notable for the following components:   Creatinine, Ser 1.49 (*)    GFR calc non Af Amer 55 (*)    All other components within normal limits  TROPONIN I - Abnormal; Notable for the following components:   Troponin I 0.38 (*)    All other components within normal limits  TROPONIN I - Abnormal; Notable for the following components:   Troponin I 0.38 (*)    All other components within normal limits  GLUCOSE, CAPILLARY - Abnormal; Notable for the following components:   Glucose-Capillary 148 (*)    All other components within normal limits  GLUCOSE, CAPILLARY - Abnormal; Notable for the following components:   Glucose-Capillary 66 (*)    All other components within normal limits  I-STAT TROPONIN, ED - Abnormal; Notable for the following components:   Troponin i, poc 0.27 (*)    All other components within normal limits  I-STAT TROPONIN, ED - Abnormal; Notable for the following components:   Troponin i, poc 0.34 (*)    All other components within normal limits  MRSA PCR SCREENING  TROPONIN I  HEPARIN LEVEL (UNFRACTIONATED)  TSH    EKG None - see clinical course  Radiology Dg Chest 2 View  Result Date: 07/14/2017 CLINICAL DATA:  47 year old male with acute chest pain  EXAM: CHEST - 2 VIEW COMPARISON:  06/30/2017 and prior radiographs FINDINGS: Mild cardiomegaly, mild chronic peribronchial thickening minimal LEFT basilar scarring again noted. There is no evidence of focal airspace disease, pulmonary edema, suspicious pulmonary nodule/mass, pleural effusion, or pneumothorax. No acute bony abnormalities are identified. IMPRESSION: Mild cardiomegaly without evidence of acute cardiopulmonary disease. Electronically Signed   By: Margarette Canada M.D.   On: 07/14/2017 19:34    Procedures .Critical Care Performed by: Hayden Rasmussen, MD Authorized by: Hayden Rasmussen, MD   Critical care provider statement:    Critical care time (minutes):  30   Critical care time was exclusive of:  Separately billable procedures and treating other patients   Critical care was necessary to treat or prevent imminent or life-threatening deterioration of the following conditions:  Cardiac failure   Critical care was time spent personally by me on the following activities:  Development of treatment plan with patient or surrogate, discussions with consultants, evaluation of patient's response to treatment, examination of patient, obtaining history from patient or surrogate, ordering and performing treatments and interventions, ordering and review of laboratory studies, ordering and review of radiographic studies, pulse oximetry, re-evaluation of patient's condition and review of old charts   I assumed direction of critical care for this patient from another provider in my specialty: no     (including critical care time)  Medications Ordered in ED Medications  heparin ADULT infusion 100 units/mL (25000 units/252mL sodium chloride 0.45%) (1,350 Units/hr Intravenous Rate/Dose Verify 07/15/17 0627)  aspirin EC tablet 81 mg (81 mg Oral Given 07/15/17 0841)  nitroGLYCERIN (NITROSTAT) SL tablet 0.4 mg (0.4 mg Sublingual Given 07/15/17 0034)  acetaminophen (TYLENOL) tablet 650 mg (has no administration  in time range)  ondansetron (ZOFRAN) injection 4 mg (has no administration in time range)  sucralfate (CARAFATE) tablet 1 g (1 g Oral Given 07/15/17 0835)  DULoxetine (CYMBALTA) DR capsule 60 mg (60 mg Oral Given 07/15/17 0839)  ticagrelor (BRILINTA) tablet 90 mg (90 mg Oral Given 07/15/17 0840)  potassium chloride SA (K-DUR,KLOR-CON) CR tablet 20 mEq (20 mEq Oral Given 07/15/17 0842)  metoprolol tartrate (LOPRESSOR) tablet 100 mg (100 mg Oral Given 07/15/17 0841)  lisinopril (PRINIVIL,ZESTRIL) tablet 40 mg (40 mg Oral Given 07/15/17 0840)  atorvastatin (LIPITOR) tablet 80 mg (has no administration in time range)  insulin glargine (LANTUS) injection 42 Units (42 Units Subcutaneous Given 07/15/17 0416)  methocarbamol (ROBAXIN) tablet 500 mg (has no administration in time range)  spironolactone (ALDACTONE) tablet 50 mg (50 mg Oral Given 07/15/17 0839)  pregabalin (LYRICA) capsule 100 mg (100 mg Oral Given 07/15/17 0836)  amLODipine (NORVASC) tablet 10 mg (10 mg Oral Given 07/15/17 0835)  pantoprazole (PROTONIX) EC tablet 40 mg (40 mg Oral Given 07/15/17 0841)  isosorbide mononitrate (IMDUR) 24 hr tablet 30 mg (30 mg Oral Given 07/15/17 0836)  gabapentin (NEURONTIN) capsule 900 mg (900 mg Oral Given 07/15/17 0842)  furosemide (LASIX) tablet 20 mg (20 mg Oral Given 07/15/17 0835)  insulin aspart (novoLOG) injection 10 Units (10 Units Subcutaneous Given 07/15/17 0849)  fentaNYL (SUBLIMAZE) injection 50 mcg (50 mcg Intravenous Given 07/15/17 0827)  morphine 4 MG/ML injection 4 mg (4 mg Intravenous Given 07/14/17 1927)  morphine 4 MG/ML injection 4 mg (4 mg Intravenous Given 07/14/17 2223)  heparin bolus via infusion 4,000 Units (4,000 Units Intravenous Bolus from Bag 07/14/17 2224)  aspirin chewable tablet 324 mg (324 mg Oral Given 07/15/17 0026)    Or  aspirin suppository 300 mg ( Rectal See Alternative 07/15/17 0026)     Initial Impression / Assessment and Plan / ED Course  I have reviewed the triage vital signs and the nursing  notes.  Pertinent labs & imaging results that were available during my care of the patient were reviewed by me and considered in my medical decision making (see chart for details).  Clinical Course as of Jul 16 923  Fri Jul 14, 2017  1909 Last cath 5/19 - Conclusion   LM: Normal LAD: Prior prox-distal LAD stents with 20% prox LAD restenosis. Diagonal with moderate disease LCx: Patent OM2 stent with % restenosis        40% restensosis in small distal LCx OM2 90% stenosis. Successful PCI w/Resolute DES 2.75 X 18 mm RCA: Patent Distal RCA stent with 0% restenosis         RPL with FFR negative (0.91) moderate disease         Small RPDA with severe diffuse disease, best treated medically      [MB]  1909 Patient with known coronary disease and multiple ED visits for chest pain.  He was admitted 2 times ago and ruled out in the last time he had 2 troponins and was sent home.  Initial plan is get labs EKG. get pain control and then get a second troponin.   [MB]  1912 ECG today is normal sinus rhythm rate 97 right bundle branch block pattern with no acute ST-T changes compared with prior EKG   [MB]  1942 Patient's  first troponin can is elevated at 0.27.  Cardiology has been paged for recommendations.   [MB]  2022 Discussed with Dr. Einar Gip patient's covering cardiologist.  He knows the patient well and his recommendations are to get a second troponin a couple hours and if its rising he would admit him to the hospital.  He is agreeable to pain control in the meantime and if we can get him pain-free and the tropes are not rising he would recommend discharge.   [MB]  2142 Repeat troponin has risen slightly to 0.34.  Patient reevaluated and is still in significant pain he said the morphine did not help very much.  I have ordered a second dose of morphine and  put a page out to Dr. Einar Gip for his recommendations   [MB]  2145 Discussed with Dr. Einar Gip who is admitting him to his service.  He asked that  we start him on a heparin drip per protocol and admit him to observation status on telemetry and the floor to call him when he arrives.   [MB]    Clinical Course User Index [MB] Hayden Rasmussen, MD     Final Clinical Impressions(s) / ED Diagnoses   Final diagnoses:  Unstable angina [facility])  Elevated troponin    ED Discharge Orders    None       Hayden Rasmussen, MD 07/15/17 [phone]

## 2017-07-14 NOTE — ED Triage Notes (Signed)
Pt complains of chest pain that started at 1700 while sitting at the shelter. Pt states the pain radiates down his left arm and he had some nausea associated with this. Pt also reports getting dizzy, weak, and light headed.

## 2017-07-14 NOTE — Progress Notes (Signed)
[facility] for heparin Indication: chest pain/ACS  Heparin Dosing Weight: 89.1 kg  Labs: Recent Labs    07/14/17 1910  HGB 10.9*  HCT 33.7*  PLT 441*  CREATININE 1.53*    Assessment: 34 yom presenting with CP, elevated troponin. Pharmacy consulted to dose heparin for ACS. Not on anticoagulation PTA. Hg 10.9, plt 441 on admit. No bleed documented.  Goal of Therapy:  Heparin level 0.3-0.7 units/ml Monitor platelets by anticoagulation protocol: Yes   Plan:  Heparin 4000 unit bolus Start heparin at 1200 units/h 6h heparin level Daily heparin level/CBC Monitor s/sx bleeding  Elicia Lamp, PharmD, BCPS Clinical Pharmacist 07/14/2017 9:50 PM

## 2017-07-14 NOTE — ED Notes (Signed)
Heparin infusion and bolus verified by Jerrye Beavers, RN.

## 2017-07-14 NOTE — Telephone Encounter (Signed)
Patient was called and informed of lab results. 

## 2017-07-14 NOTE — ED Triage Notes (Signed)
Pt brought in by [facility] from shelter for CP x2 hours. Pt describes pain as 10/10 sharp, states it radiates across his chest and down his left arm. Pt endorses SOB, nausea, dizziness. Pt given x1 nitro PTA w/o relief. Pt given 324mg  aspirin PTA as well. Pt has hx of x9 stents placed in the last 6 months. Pt A+Ox4 and in NAD on arrival.

## 2017-07-15 DIAGNOSIS — E119 Type 2 diabetes mellitus without complications: Secondary | ICD-10-CM | POA: Diagnosis not present

## 2017-07-15 DIAGNOSIS — I2510 Atherosclerotic heart disease of native coronary artery without angina pectoris: Secondary | ICD-10-CM | POA: Diagnosis not present

## 2017-07-15 DIAGNOSIS — R0789 Other chest pain: Secondary | ICD-10-CM | POA: Diagnosis not present

## 2017-07-15 DIAGNOSIS — I10 Essential (primary) hypertension: Secondary | ICD-10-CM | POA: Diagnosis not present

## 2017-07-15 LAB — CBC
HCT: 32.9 % — ABNORMAL LOW (ref 39.0–52.0)
HCT: 33.1 % — ABNORMAL LOW (ref 39.0–52.0)
HEMOGLOBIN: 10.5 g/dL — AB (ref 13.0–17.0)
Hemoglobin: 10.4 g/dL — ABNORMAL LOW (ref 13.0–17.0)
MCH: 27.9 pg (ref 26.0–34.0)
MCH: 28.4 pg (ref 26.0–34.0)
MCHC: 31.4 g/dL (ref 30.0–36.0)
MCHC: 31.9 g/dL (ref 30.0–36.0)
MCV: 88.7 fL (ref 78.0–100.0)
MCV: 88.9 fL (ref 78.0–100.0)
PLATELETS: 416 10*3/uL — AB (ref 150–400)
Platelets: 417 10*3/uL — ABNORMAL HIGH (ref 150–400)
RBC: 3.73 MIL/uL — ABNORMAL LOW (ref 4.22–5.81)
RBC: 3.7 MIL/uL — AB (ref 4.22–5.81)
RDW: 14.7 % (ref 11.5–15.5)
RDW: 14.8 % (ref 11.5–15.5)
WBC: 9.6 10*3/uL (ref 4.0–10.5)
WBC: 9.9 10*3/uL (ref 4.0–10.5)

## 2017-07-15 LAB — TROPONIN I
TROPONIN I: 0.36 ng/mL — AB (ref <0.03)
Troponin I: 0.38 ng/mL (ref <0.03)
Troponin I: 0.38 ng/mL (ref <0.03)

## 2017-07-15 LAB — HEPARIN LEVEL (UNFRACTIONATED)
HEPARIN UNFRACTIONATED: 0.26 [IU]/mL — AB (ref 0.30–0.70)
HEPARIN UNFRACTIONATED: 0.14 [IU]/mL — AB (ref 0.30–0.70)

## 2017-07-15 LAB — GLUCOSE, CAPILLARY
GLUCOSE-CAPILLARY: 66 mg/dL — AB (ref 70–99)
Glucose-Capillary: 148 mg/dL — ABNORMAL HIGH (ref 70–99)

## 2017-07-15 LAB — MRSA PCR SCREENING: MRSA by PCR: NEGATIVE

## 2017-07-15 LAB — CREATININE, SERUM
CREATININE: 1.49 mg/dL — AB (ref 0.61–1.24)
GFR calc Af Amer: >60 mL/min (ref >60)
GFR, EST NON AFRICAN AMERICAN: 55 mL/min — AB (ref >60)

## 2017-07-15 LAB — TSH: TSH: 0.724 u[IU]/mL (ref 0.350–4.500)

## 2017-07-15 MED ORDER — FENTANYL CITRATE (PF) 100 MCG/2ML IJ SOLN
50.0000 ug | INTRAMUSCULAR | Status: DC | PRN
Start: 2017-07-15 — End: 2017-07-15
  Administered 2017-07-15 (×3): 50 ug via INTRAVENOUS
  Filled 2017-07-15 (×4): qty 2

## 2017-07-15 NOTE — Progress Notes (Signed)
[facility] for heparin Indication: chest pain/ACS  Heparin Dosing Weight: 89.1 kg  Labs: Recent Labs    07/14/17 1910 07/15/17 0044 07/15/17 0418  HGB 10.9* 10.5* 10.4*  HCT 33.7* 32.9* 33.1*  PLT 441* 417* 416*  HEPARINUNFRC  --   --  0.26*  CREATININE 1.53* 1.49*  --   TROPONINI  --  0.38*  --     Assessment: 23 yom presenting with CP, elevated troponin. Pharmacy consulted to dose heparin for ACS. Not on anticoagulation PTA.  Initial heparin level low at 0.26units/mL. No issues with line per discussion with RN. CBC this AM is stable from admission, no bleeding noted.  Goal of Therapy:  Heparin level 0.3-0.7 units/ml Monitor platelets by anticoagulation protocol: Yes   Plan:  Increase heparin to 1350 units/hr Recheck heparin level in 6 hours Daily heparin level and CBC Follow cardiology plans  Alton Tremblay D. Willeen Novak, PharmD, BCPS Clinical Pharmacist [phone] Please check AMION for all [place] numbers 07/15/2017 5:25 AM

## 2017-07-15 NOTE — Discharge Summary (Signed)
Allergies as of 07/15/2017      Reactions   Benadryl [diphenhydramine] Nausea Only   Penicillins Other (See Comments)   Childhood allergy  Has patient had a PCN reaction causing immediate rash, facial/tongue/throat swelling, SOB or lightheadedness with hypotension: Unknown Has patient had a PCN reaction causing severe rash involving mucus membranes or skin necrosis: Unknown Has patient had a PCN reaction that required hospitalization: Unknown Has patient had a PCN reaction occurring within the last 10 years: No If all of the above answers are "NO", then may proceed with Cephalosporin use.      Medication List    STOP taking these medications   gabapentin 300 MG capsule Commonly known as:  NEURONTIN     TAKE these medications   acetaminophen 325 MG tablet Commonly known as:  TYLENOL Take 2 tablets (650 mg total) by mouth every 6 (six) hours as needed for moderate pain or headache.   amLODipine 10 MG tablet Commonly known as:  NORVASC Take 10 mg by mouth daily.   aspirin EC 81 MG tablet Take 81 mg by mouth daily.   atorvastatin 80 MG tablet Commonly known as:  LIPITOR Take 1 tablet (80 mg total) by mouth daily at 6 PM.   diclofenac sodium 1 % Gel Commonly known as:  VOLTAREN Apply 4 g topically 4 (four) times daily.   DULoxetine 60 MG capsule Commonly known as:  CYMBALTA Take 1 capsule (60 mg total) by mouth daily.   furosemide 20 MG tablet Commonly known as:  LASIX Take 1 tablet (20 mg total) by mouth daily.   glucose blood test strip Commonly known as:  TRUE METRIX BLOOD GLUCOSE TEST 3 times daily before meals   insulin aspart 100 UNIT/ML FlexPen Commonly known as:  NOVOLOG FLEXPEN Inject 10 Units into the skin 3 (three) times daily with meals.   Insulin Glargine 100 UNIT/ML Solostar Pen Commonly known as:  LANTUS SOLOSTAR Inject 42 Units into the skin daily at 10 pm.   isosorbide mononitrate 30 MG 24 hr tablet Commonly known as:  IMDUR Take 1 tablet (30 mg  total) by mouth daily.   lidocaine 5 % Commonly known as:  LIDODERM Place 1 patch onto the skin daily. Remove & Discard patch within 12 hours or as directed by MD   lisinopril 40 MG tablet Commonly known as:  PRINIVIL,ZESTRIL Take 1 tablet (40 mg total) by mouth every morning.   methocarbamol 500 MG tablet Commonly known as:  ROBAXIN Take 1 tablet (500 mg total) by mouth every 8 (eight) hours as needed for muscle spasms.   metoprolol tartrate 100 MG tablet Commonly known as:  LOPRESSOR Take 1 tablet (100 mg total) by mouth 2 (two) times daily.   nitroGLYCERIN 0.4 MG SL tablet Commonly known as:  NITROSTAT Place 1 tablet (0.4 mg total) under the tongue See admin instructions. Place 1 tablet (0.4mg ) under tongue every 5 minutes as needed for chest pain. Call doctor/ 911 if take 2 doses. Max 3/Day   omeprazole 20 MG capsule Commonly known as:  PRILOSEC Take 20 mg by mouth daily.   potassium chloride SA 20 MEQ tablet Commonly known as:  K-DUR,KLOR-CON Take 1 tablet (20 mEq total) by mouth daily.   pregabalin 100 MG capsule Commonly known as:  LYRICA Take 1 capsule (100 mg total) by mouth 2 (two) times daily.   spironolactone 50 MG tablet Commonly known as:  ALDACTONE Take 1 tablet (50 mg total) by mouth daily.   sucralfate 1  g tablet Commonly known as:  CARAFATE Take 1 tablet (1 g total) by mouth 4 (four) times daily -  with meals and at bedtime for 10 days.   ticagrelor 90 MG Tabs tablet Commonly known as:  BRILINTA Take 1 tablet (90 mg total) by mouth 2 (two) times daily.   TRUE METRIX METER Devi 1 each by Does not apply route 3 (three) times daily before meals.   TRUEPLUS LANCETS 28G Misc 1 each by Does not apply route 3 (three) times daily before meals.      Patient admitted and kept overnight and discharged home the following morning with flat troponin, chest pain felt to be musculoskeletal.  Please see my history and physical for complete details.

## 2017-07-15 NOTE — Progress Notes (Signed)
CSW was consulted by RN Marya Amsler for transportation needs for pt.  CSW gave additional resources to pt.  No further needs are note at this time.  CSW signing off.  Reed Breech LCSWA [phone]

## 2017-07-15 NOTE — Progress Notes (Signed)
Patient still having CP 7/10, gave Fentanyl at 0137, per patient states CP has been relieved a little bit.  Place patient on O2 to help with CP.

## 2017-07-15 NOTE — H&P (Signed)
Aaron Pearson is an 47 y.o. male.   Chief Complaint: Chest pain HPI: Aaron Pearson  is a 47 y.o. male  With Patient with known coronary artery disease, LAD stenting with overlapping stents on 05/05/2017 along with angioplasty to circumflex and stenting same time, has severely diffusely diseased right PDA being treated medically.  Past medical history significant for chronic diastolic heart failure, diabetes mellitus with stage III chronic kidney disease, hypertension, GERD.  [facility] the homeless shelter.  He has been compliant with taking his medications.  Admitted to the hospital yesterday with diffuse chest discomfort that was continuous not relieved with nitroglycerin but help with morphine sulfate.  His troponin was minimally elevated, hence was admitted for observation.  Patient still has continuous chest pain all night.  States that it is diffuse all over his chest and also to his left arm.  No other associated symptoms.  Past Medical History:  Diagnosis Date  . Anxiety   . 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 Coronary artery disease   . Diabetes mellitus without complication ([place])   . GERD (gastroesophageal reflux disease)   . Headache   . Hx of diabetic neuropathy   . Hypertension   . Myocardial infarction ([place]) 2018  . Renal disorder     Past Surgical History:  Procedure Laterality Date  . CARDIAC CATHETERIZATION  05/05/2017  . CARPAL TUNNEL RELEASE    . CORONARY STENT INTERVENTION N/A 05/05/2017   Procedure: CORONARY STENT INTERVENTION;  Surgeon: Nigel Mormon, MD;  Location: [facility];  Service: Cardiovascular;  Laterality: N/A;  . CORONARY STENT PLACEMENT    . INTRAVASCULAR PRESSURE WIRE/FFR STUDY N/A 05/05/2017   Procedure: INTRAVASCULAR PRESSURE WIRE/FFR STUDY;  Surgeon: Nigel Mormon, MD;  Location: [facility];  Service: Cardiovascular;  Laterality: N/A;  . INTRAVASCULAR PRESSURE WIRE/FFR STUDY N/A 05/10/2017   Procedure: INTRAVASCULAR PRESSURE  WIRE/FFR STUDY;  Surgeon: Nigel Mormon, MD;  Location: [facility];  Service: Cardiovascular;  Laterality: N/A;  . LEFT HEART CATH AND CORONARY ANGIOGRAPHY N/A 05/05/2017   Procedure: LEFT HEART CATH AND CORONARY ANGIOGRAPHY;  Surgeon: Nigel Mormon, MD;  Location: [facility];  Service: Cardiovascular;  Laterality: N/A;  . LEFT HEART CATH AND CORONARY ANGIOGRAPHY N/A 05/10/2017   Procedure: LEFT HEART CATH AND CORONARY ANGIOGRAPHY;  Surgeon: Nigel Mormon, MD;  Location: [facility];  Service: Cardiovascular;  Laterality: N/A;    Family History  Problem Relation Age of Onset  . Coronary artery disease Mother   . Coronary artery disease Father    Social History:  reports that he has never smoked. He has never used smokeless tobacco. He reports that he does not drink alcohol or use drugs.  Allergies:  Allergies  Allergen Reactions  . Benadryl [Diphenhydramine] Nausea Only  . Penicillins Other (See Comments)    Childhood allergy  Has patient had a PCN reaction causing immediate rash, facial/tongue/throat swelling, SOB or lightheadedness with hypotension: Unknown Has patient had a PCN reaction causing severe rash involving mucus membranes or skin necrosis: Unknown Has patient had a PCN reaction that required hospitalization: Unknown Has patient had a PCN reaction occurring within the last 10 years: No If all of the above answers are "NO", then may proceed with Cephalosporin use.    Review of Systems  HENT: Negative.   Eyes: Negative.   Respiratory: Positive for shortness of breath.   Cardiovascular: Positive for chest pain. Negative  for palpitations, orthopnea, claudication, leg swelling and PND.  Gastrointestinal: Negative.   Genitourinary: Negative.   Musculoskeletal: Negative.   Skin: Negative.   Neurological: Positive for dizziness.  Endo/Heme/Allergies: Negative.   Psychiatric/Behavioral: Positive for depression.    Blood pressure  134/90, pulse 72, temperature 97.8 F (36.6 C), temperature source Oral, resp. rate 20, height _0  (1.727 m), weight 101 kg (222 lb 9.6 oz), SpO2 99 %. Body mass index is 33.85 kg/m.  Physical Exam  Constitutional: He is oriented to person, place, and time. He appears well-developed. No distress.  HENT:  Head: Atraumatic.  Eyes: Conjunctivae are normal.  Neck: Normal range of motion. Neck supple.  Cardiovascular: Normal rate, regular rhythm, normal heart sounds and intact distal pulses.  Pulmonary/Chest: Effort normal and breath sounds normal. He exhibits tenderness.  Abdominal: Soft. Bowel sounds are normal.  Musculoskeletal: Normal range of motion. He exhibits no edema.  Neurological: He is alert and oriented to person, place, and time.  Skin: Skin is warm and dry.  Psychiatric: He has a normal mood and affect.    Results for orders placed or performed during the hospital encounter of 07/14/17 (from the past 48 hour(s))  Basic metabolic panel     Status: Abnormal   Collection Time: 07/14/17  7:10 PM  Result Value Ref Range   Sodium 139 135 - 145 mmol/L   Potassium 4.3 3.5 - 5.1 mmol/L   Chloride 108 98 - 111 mmol/L    Comment: Please note change in reference range.   CO2 21 (L) 22 - 32 mmol/L   Glucose, Bld 175 (H) 70 - 99 mg/dL    Comment: Please note change in reference range.   BUN 17 6 - 20 mg/dL    Comment: Please note change in reference range.   Creatinine, Ser 1.53 (H) 0.61 - 1.24 mg/dL   Calcium 8.8 (L) 8.9 - 10.3 mg/dL   GFR calc non Af Amer 54 (L) >60 mL/min   GFR calc Af Amer >60 >60 mL/min    Comment: (NOTE) The eGFR has been calculated using the CKD EPI equation. This calculation has not been validated in all clinical situations. eGFR's persistently <60 mL/min signify possible Chronic Kidney Disease.    Anion gap 10 5 - 15    Comment: Performed at [facility] [address] [place], [place] [address]  CBC     Status: Abnormal   Collection Time:  07/14/17  7:10 PM  Result Value Ref Range   WBC 10.5 4.0 - 10.5 K/uL   RBC 3.78 (L) 4.22 - 5.81 MIL/uL   Hemoglobin 10.9 (L) 13.0 - 17.0 g/dL   HCT 33.7 (L) 39.0 - 52.0 %   MCV 89.2 78.0 - 100.0 fL   MCH 28.8 26.0 - 34.0 pg   MCHC 32.3 30.0 - 36.0 g/dL   RDW 14.7 11.5 - 15.5 %   Platelets 441 (H) 150 - 400 K/uL    Comment: Performed at [facility] [address] [place], [place] [address]  I-stat troponin, ED     Status: Abnormal   Collection Time: 07/14/17  7:18 PM  Result Value Ref Range   Troponin i, poc 0.27 (HH) 0.00 - 0.08 ng/mL   Comment NOTIFIED PHYSICIAN    Comment 3            Comment: Due to the release kinetics of cTnI, a negative result within the first hours of the onset of symptoms does not rule  out myocardial infarction with certainty. If myocardial infarction is still suspected, repeat the test at appropriate intervals.   I-stat troponin, ED     Status: Abnormal   Collection Time: 07/14/17  9:27 PM  Result Value Ref Range   Troponin i, poc 0.34 (HH) 0.00 - 0.08 ng/mL   Comment NOTIFIED PHYSICIAN    Comment 3            Comment: Due to the release kinetics of cTnI, a negative result within the first hours of the onset of symptoms does not rule out myocardial infarction with certainty. If myocardial infarction is still suspected, repeat the test at appropriate intervals.   CBC     Status: Abnormal   Collection Time: 07/15/17 12:44 AM  Result Value Ref Range   WBC 9.9 4.0 - 10.5 K/uL   RBC 3.70 (L) 4.22 - 5.81 MIL/uL   Hemoglobin 10.5 (L) 13.0 - 17.0 g/dL   HCT 32.9 (L) 39.0 - 52.0 %   MCV 88.9 78.0 - 100.0 fL   MCH 28.4 26.0 - 34.0 pg   MCHC 31.9 30.0 - 36.0 g/dL   RDW 14.8 11.5 - 15.5 %   Platelets 417 (H) 150 - 400 K/uL    Comment: Performed at [facility], [place] [address], [place], [place] [address]  Creatinine, serum     Status: Abnormal   Collection Time: 07/15/17 12:44 AM  Result Value Ref Range   Creatinine, Ser 1.49 (H) 0.61  - 1.24 mg/dL   GFR calc non Af Amer 55 (L) >60 mL/min   GFR calc Af Amer >60 >60 mL/min    Comment: (NOTE) The eGFR has been calculated using the CKD EPI equation. This calculation has not been validated in all clinical situations. eGFR's persistently <60 mL/min signify possible Chronic Kidney Disease. Performed at [facility], [place] [address] [place], [place] [address]   Troponin I     Status: Abnormal   Collection Time: 07/15/17 12:44 AM  Result Value Ref Range   Troponin I 0.38 (HH) <0.03 ng/mL    Comment: CRITICAL RESULT CALLED TO, READ BACK BY AND VERIFIED WITH: DUVALL D,RN 07/15/17 0140 WAYK Performed at [facility], [place] [address], [place], [place] [address]   MRSA PCR Screening     Status: None   Collection Time: 07/15/17  1:33 AM  Result Value Ref Range   MRSA by PCR NEGATIVE NEGATIVE    Comment:        The GeneXpert MRSA Assay (FDA approved for NASAL specimens only), is one component of a comprehensive MRSA colonization surveillance program. It is not intended to diagnose MRSA infection nor to guide or monitor treatment for MRSA infections. Performed at [facility], [place] [address], [place], [place] [address]   Glucose, capillary     Status: Abnormal   Collection Time: 07/15/17  2:12 AM  Result Value Ref Range   Glucose-Capillary 148 (H) 70 - 99 mg/dL  Heparin level (unfractionated)     Status: Abnormal   Collection Time: 07/15/17  4:18 AM  Result Value Ref Range   Heparin Unfractionated 0.26 (L) 0.30 - 0.70 IU/mL    Comment: (NOTE) If heparin results are below expected values, and patient dosage has  been confirmed, suggest follow up testing of antithrombin III levels. Performed at [facility], [place] [address] [place], [place] [address]   CBC     Status: Abnormal   Collection Time: 07/15/17  4:18 AM  Result Value Ref Range   WBC 9.6 4.0 - 10.5 K/uL   RBC 3.73 (L) 4.22 - 5.81 MIL/uL   Hemoglobin 10.4 (L) 13.0 - 17.0 g/dL    HCT 33.1 (L) 39.0 - 52.0 %   MCV 88.7 78.0 - 100.0 fL   MCH 27.9 26.0 - 34.0 pg   MCHC 31.4 30.0 - 36.0 g/dL   RDW 14.7 11.5 - 15.5 %   Platelets 416 (H) 150 - 400 K/uL    Comment: Performed at [facility] [address] [place], [place] [address]  Troponin I     Status: Abnormal   Collection Time: 07/15/17  4:18 AM  Result Value Ref Range   Troponin I 0.38 (HH) <0.03 ng/mL    Comment: CRITICAL VALUE NOTED.  VALUE IS CONSISTENT WITH PREVIOUSLY REPORTED AND CALLED VALUE. Performed at [facility], [place] [address], [place], [place] [address]   Glucose, capillary     Status: Abnormal   Collection Time: 07/15/17  7:38 AM  Result Value Ref Range   Glucose-Capillary 66 (L) 70 - 99 mg/dL    Labs:   Lab Results  Component Value Date   WBC 9.6 07/15/2017   HGB 10.4 (L) 07/15/2017   HCT 33.1 (L) 07/15/2017   MCV 88.7 07/15/2017   PLT 416 (H) 07/15/2017    Recent Labs  Lab 07/14/17 1910 07/15/17 0044  NA 139  --   K 4.3  --   CL 108  --   CO2 21*  --   BUN 17  --   CREATININE 1.53* 1.49*  CALCIUM 8.8*  --   GLUCOSE 175*  --     Lipid Panel     Component Value Date/Time   CHOL 152 05/05/2017 0418   TRIG 163 (H) 05/05/2017 0418   HDL 29 (L) 05/05/2017 0418   CHOLHDL 5.2 05/05/2017 0418   VLDL 33 05/05/2017 0418   LDLCALC 90 05/05/2017 0418    BNP (last 3 results) Recent Labs    05/04/17 1150  BNP 517.8*    HEMOGLOBIN A1C Lab Results  Component Value Date   HGBA1C 10.7 05/16/2017   Cardiac Panel (last 3 results) Recent Labs    06/17/17 0738 07/15/17 0044 07/15/17 0418  TROPONINI <0.03 0.38* 0.38*    Medications Prior to Admission  Medication Sig Dispense Refill  . amLODipine (NORVASC) 10 MG tablet Take 10 mg by mouth daily.  3  . aspirin EC 81 MG tablet Take 81 mg by mouth daily.    Marland Kitchen atorvastatin (LIPITOR) 80 MG tablet Take 1 tablet (80 mg total) by mouth daily at 6 PM. 30 tablet 3  . diclofenac sodium (VOLTAREN) 1 % GEL Apply 4 g  topically 4 (four) times daily. 100 g 1  . DULoxetine (CYMBALTA) 60 MG capsule Take 1 capsule (60 mg total) by mouth daily. 30 capsule 3  . furosemide (LASIX) 20 MG tablet Take 1 tablet (20 mg total) by mouth daily. 30 tablet 3  . gabapentin (NEURONTIN) 300 MG capsule Take 3 capsules (900 mg total) by mouth 3 (three) times daily. 270 capsule 3  . insulin aspart (NOVOLOG FLEXPEN) 100 UNIT/ML FlexPen Inject 10 Units into the skin 3 (three) times daily with meals. 30 mL 3  . Insulin Glargine (LANTUS SOLOSTAR) 100 UNIT/ML Solostar Pen Inject 42 Units into the skin daily at 10 pm. 5 pen 3  . isosorbide mononitrate (IMDUR) 30 MG 24 hr tablet Take 1 tablet (  30 mg total) by mouth daily. 30 tablet 0  . lidocaine (LIDODERM) 5 % Place 1 patch onto the skin daily. Remove & Discard patch within 12 hours or as directed by MD 30 patch 1  . lisinopril (PRINIVIL,ZESTRIL) 40 MG tablet Take 1 tablet (40 mg total) by mouth every morning. 30 tablet 3  . methocarbamol (ROBAXIN) 500 MG tablet Take 1 tablet (500 mg total) by mouth every 8 (eight) hours as needed for muscle spasms. 90 tablet 3  . metoprolol tartrate (LOPRESSOR) 100 MG tablet Take 1 tablet (100 mg total) by mouth 2 (two) times daily. 60 tablet 3  . nitroGLYCERIN (NITROSTAT) 0.4 MG SL tablet Place 1 tablet (0.4 mg total) under the tongue See admin instructions. Place 1 tablet (0.42m) under tongue every 5 minutes as needed for chest pain. Call doctor/ 911 if take 2 doses. Max 3/Day 30 tablet 3  . omeprazole (PRILOSEC) 20 MG capsule Take 20 mg by mouth daily.    . potassium chloride SA (K-DUR,KLOR-CON) 20 MEQ tablet Take 1 tablet (20 mEq total) by mouth daily. 30 tablet 3  . pregabalin (LYRICA) 100 MG capsule Take 1 capsule (100 mg total) by mouth 2 (two) times daily. 180 capsule 3  . spironolactone (ALDACTONE) 50 MG tablet Take 1 tablet (50 mg total) by mouth daily. 30 tablet 0  . sucralfate (CARAFATE) 1 g tablet Take 1 tablet (1 g total) by mouth 4 (four)  times daily -  with meals and at bedtime for 10 days. 40 tablet 0  . ticagrelor (BRILINTA) 90 MG TABS tablet Take 1 tablet (90 mg total) by mouth 2 (two) times daily. 180 tablet 3  . acetaminophen (TYLENOL) 325 MG tablet Take 2 tablets (650 mg total) by mouth every 6 (six) hours as needed for moderate pain or headache. (Patient not taking: Reported on 07/14/2017) 90 tablet 3  . Blood Glucose Monitoring Suppl (TRUE METRIX METER) DEVI 1 each by Does not apply route 3 (three) times daily before meals. 1 Device 0  . glucose blood (TRUE METRIX BLOOD GLUCOSE TEST) test strip 3 times daily before meals 100 each 12  . TRUEPLUS LANCETS 28G MISC 1 each by Does not apply route 3 (three) times daily before meals. 100 each 12     Current Facility-Administered Medications:  .  acetaminophen (TYLENOL) tablet 650 mg, 650 mg, Oral, Q4H PRN, GAdrian Prows MD .  amLODipine (NORVASC) tablet 10 mg, 10 mg, Oral, Daily, GAdrian Prows MD, 10 mg at 07/15/17 0835 .  aspirin EC tablet 81 mg, 81 mg, Oral, Daily, GAdrian Prows MD, 81 mg at 07/15/17 0841 .  atorvastatin (LIPITOR) tablet 80 mg, 80 mg, Oral, q1800, GAdrian Prows MD .  DULoxetine (CYMBALTA) DR capsule 60 mg, 60 mg, Oral, Daily, GAdrian Prows MD, 60 mg at 07/15/17 0839 .  fentaNYL (SUBLIMAZE) injection 50 mcg, 50 mcg, Intravenous, Q2H PRN, GAdrian Prows MD, 50 mcg at 07/15/17 0827 .  furosemide (LASIX) tablet 20 mg, 20 mg, Oral, Daily, GAdrian Prows MD, 20 mg at 07/15/17 0835 .  gabapentin (NEURONTIN) capsule 900 mg, 900 mg, Oral, TID, GAdrian Prows MD, 900 mg at 07/15/17 0842 .  heparin ADULT infusion 100 units/mL (25000 units/253msodium chloride 0.45%), 1,350 Units/hr, Intravenous, Continuous, Bajbus, Lauren D, RPH, Last Rate: 13.5 mL/hr at 07/15/17 0627, 1,350 Units/hr at 07/15/17 0627 .  insulin aspart (novoLOG) injection 10 Units, 10 Units, Subcutaneous, TID WC, GaAdrian ProwsMD, 10 Units at 07/15/17 0849 .  insulin glargine (LANTUS) injection  42 Units, 42 Units,  Subcutaneous, Q2200, Adrian Prows, MD, 42 Units at 07/15/17 0416 .  isosorbide mononitrate (IMDUR) 24 hr tablet 30 mg, 30 mg, Oral, Daily, Adrian Prows, MD, 30 mg at 07/15/17 0836 .  lisinopril (PRINIVIL,ZESTRIL) tablet 40 mg, 40 mg, Oral, q morning - 10a, Adrian Prows, MD, 40 mg at 07/15/17 0840 .  methocarbamol (ROBAXIN) tablet 500 mg, 500 mg, Oral, Q8H PRN, Adrian Prows, MD .  metoprolol tartrate (LOPRESSOR) tablet 100 mg, 100 mg, Oral, BID, Adrian Prows, MD, 100 mg at 07/15/17 0841 .  nitroGLYCERIN (NITROSTAT) SL tablet 0.4 mg, 0.4 mg, Sublingual, Q5 Min x 3 PRN, Adrian Prows, MD, 0.4 mg at 07/15/17 0034 .  ondansetron (ZOFRAN) injection 4 mg, 4 mg, Intravenous, Q6H PRN, Adrian Prows, MD .  pantoprazole (PROTONIX) EC tablet 40 mg, 40 mg, Oral, Daily, Adrian Prows, MD, 40 mg at 07/15/17 0841 .  potassium chloride SA (K-DUR,KLOR-CON) CR tablet 20 mEq, 20 mEq, Oral, Daily, Adrian Prows, MD, 20 mEq at 07/15/17 0842 .  pregabalin (LYRICA) capsule 100 mg, 100 mg, Oral, BID, Adrian Prows, MD, 100 mg at 07/15/17 0836 .  spironolactone (ALDACTONE) tablet 50 mg, 50 mg, Oral, Daily, Adrian Prows, MD, 50 mg at 07/15/17 0839 .  sucralfate (CARAFATE) tablet 1 g, 1 g, Oral, TID WC & HS, Adrian Prows, MD, 1 g at 07/15/17 0835 .  ticagrelor (BRILINTA) tablet 90 mg, 90 mg, Oral, BID, Adrian Prows, MD, 90 mg at 07/15/17 0840  CARDIAC STUDIES:  EKG 07/15/2017: Normal sinus rhythm with rate of 73 bpm, left atrial enlargement, left axis deviation, right bundle branch block.  Echocardiogram 06/08/2017: Normal LV systolic function, EF 50 to 55% with grade 3 diastolic dysfunction, severely dilated left atrium.  No significant change from 05/05/2017.  Coronary angiogram 05/10/2017: Mid LAD stents, 2.75 x 38 overlapping 3.0 x 12 mm Xience and mid circumflex OM 2 stent 2.75 x 18 mm Xience and distal circumflex [place] angioplasty site widely patent, angioplasty performed 05/05/2017.  Diffusely diseased PDA branch of  RCA.  Assessment/Plan 1.  Musculoskeletal chest pain with negative cardiac troponin/flat cardiac troponins 2.  Diabetes mellitus type 2 uncontrolled with stage III chronic kidney disease 3.  Mixed hyperlipidemia 4.  Hypertension  Recommendation: Patient will be discharged home today with present medications.  I have discussed with the patient regarding his presentation with musculoskeletal chest pain and explained differences between musculoskeletal pain and also angina pectoris.  Nitroglycerin which he took at his homeless shelter and also on route via EMS did not help, morphine sulfate did.  He also has reproducible chest pain.  No EKG changes suggestive of ischemia.  We will see him back in the office.  He needs TSH.  Adrian Prows, MD 07/15/2017, 9:05 AM [facility]. [place] Pager: [phone] Office: [phone] If no answer: Cell:  [phone]

## 2017-07-16 NOTE — ED Notes (Signed)
Fentanyl wasted in sharps box in POD C med room. Same witnessed by Nelson Chimes, RN.

## 2017-07-17 ENCOUNTER — Encounter: Payer: Self-pay | Admitting: Pediatric Intensive Care

## 2017-07-18 NOTE — Congregational Nurse Program (Signed)
Congregational Nurse Program Note  Date of Encounter: 06/13/2017  Past Medical History: Past Medical History:  Diagnosis Date  . Anxiety   . 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 Coronary artery disease   . Diabetes mellitus without complication ([place])   . GERD (gastroesophageal reflux disease)   . Headache   . Hx of diabetic neuropathy   . Hypertension   . Myocardial infarction ([place]) 2018  . Renal disorder     Encounter Details:  Clinical Intake - 06/19/17 1014      Pre-visit preparation   Pre-visit preparation completed  Yes      Pain   Pain   0-10    Pain Score  8     Pain Location  Leg      Nutrition Screen   Diabetes  Yes    CBG done?  Yes    CBG resulted in Enter/ Edit results?  Yes    Did pt. bring in CBG monitor from home?  No      Functional Status   Activities of Daily Living  Independent    Ambulation  Independent with device- listed below    Home Assistive Devices/Equipment  Walker (specify Type)    Medication Administration  Independent    Home Management  Independent      Abuse/Neglect   Do you feel unsafe in your current relationship?  No    Do you feel physically threatened by others?  No    Anyone hurting you at home, work, or school?  No    Unable to ask?  No      Investment banker, operational Needed?  No     BP check. Cab voucher for upcoming appointment. [place] will provide return cab voucher.

## 2017-07-19 ENCOUNTER — Ambulatory Visit: Payer: Self-pay | Admitting: Family Medicine

## 2017-07-19 ENCOUNTER — Other Ambulatory Visit: Payer: Self-pay | Admitting: Family Medicine

## 2017-07-19 DIAGNOSIS — G8929 Other chronic pain: Secondary | ICD-10-CM

## 2017-07-19 MED FILL — LISINOPRIL 40 MG TABLET: 40 | 30 days supply | Qty: 30 | Fill #2

## 2017-07-19 MED FILL — $LANTUS SOLOSTAR 100 UNITS/: 100 | 35 days supply | Qty: 15 | Fill #1

## 2017-07-19 MED FILL — ATORVASTATIN 80 MG TABLET: 80 | 30 days supply | Qty: 30 | Fill #2

## 2017-07-19 MED FILL — ?DULoxetine HCL 6OMG CP: 60 | 30 days supply | Qty: 30 | Fill #2

## 2017-07-19 MED FILL — GABAPENTIN 300 MG CAPSULE: 300 | 30 days supply | Qty: 270 | Fill #0

## 2017-07-19 MED FILL — NOVOLOG FLEXPEN SYRINGE: 100 | 30 days supply | Qty: 9 | Fill #2

## 2017-07-19 MED FILL — TRUE METRIX TEST STRIP: 30 days supply | Qty: 100 | Fill #2

## 2017-07-19 MED FILL — ?METOPROLOL 100 MG TABLET: 100 | 30 days supply | Qty: 60 | Fill #2

## 2017-07-19 MED FILL — ?OMEPRazole 20mg CPDR: 20 | 30 days supply | Qty: 30 | Fill #2

## 2017-07-19 MED FILL — AMLODIPINE BESYLATE 10 MG T: 10 | 30 days supply | Qty: 30 | Fill #2

## 2017-07-19 MED FILL — TRUEplus LANCETS 28G MISC: 30 days supply | Qty: 100 | Fill #2

## 2017-07-19 NOTE — Addendum Note (Signed)
Addended byCharlott Rakes on: 07/19/2017 05:15 PM   Modules accepted: Level of Service

## 2017-07-19 NOTE — Congregational Nurse Program (Signed)
Congregational Nurse Program Note  Date of Encounter: 07/03/2017  Past Medical History: Past Medical History:  Diagnosis Date  . Anxiety   . 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 Coronary artery disease   . Diabetes mellitus without complication ([place])   . GERD (gastroesophageal reflux disease)   . Headache   . Hx of diabetic neuropathy   . Hypertension   . Myocardial infarction ([place]) 2018  . Renal disorder     Encounter Details: CNP Questionnaire - 07/11/17 0930      Questionnaire   Patient Status  Not Applicable    Race  White or Caucasian    Location Patient Served At  [facility]  Not Applicable    Uninsured  Uninsured (NEW 1x/quarter)    Food  No food insecurities    Housing/Utilities  No permanent housing    Transportation  Yes, need transportation assistance    Interpersonal Safety  Yes, feel physically and emotionally safe where you currently live    Medication  Yes, have medication insecurities    Medical Provider  Yes    Referrals  Primary Care Provider/Clinic;Other    ED Visit Averted  Not Applicable    Life-Saving Intervention Made  Not Applicable     BP check- client will have optho exam via disabiilty check up.

## 2017-07-19 NOTE — Congregational Nurse Program (Signed)
Congregational Nurse Program Note  Date of Encounter: 06/27/2017  Past Medical History: Past Medical History:  Diagnosis Date  . Anxiety   . 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 Coronary artery disease   . Diabetes mellitus without complication ([place])   . GERD (gastroesophageal reflux disease)   . Headache   . Hx of diabetic neuropathy   . Hypertension   . Myocardial infarction ([place]) 2018  . Renal disorder     Encounter Details: CNP Questionnaire - 07/11/17 0930      Questionnaire   Patient Status  Not Applicable    Race  White or Caucasian    Location Patient Served At  [facility]  Not Applicable    Uninsured  Uninsured (NEW 1x/quarter)    Food  No food insecurities    Housing/Utilities  No permanent housing    Transportation  Yes, need transportation assistance    Interpersonal Safety  Yes, feel physically and emotionally safe where you currently live    Medication  Yes, have medication insecurities    Medical Provider  Yes    Referrals  Primary Care Provider/Clinic;Other    ED Visit Averted  Not Applicable    Life-Saving Intervention Made  Not Applicable      BP check. Client states BGs are in a good range. [place] hasn't received call for [facility] appointment. CN will call [facility] to confirm as client's phone is no longer working. Client also requests CN call [facility] as client is supposed to follow up with them. CN called- was told by office management that client would need to have his financial assistance award letter in order to make appointment without charges. CN will contact [place] to check on CFA letter. Follow up in clinic as needed.

## 2017-07-20 ENCOUNTER — Encounter (HOSPITAL_COMMUNITY): Payer: Self-pay | Admitting: Emergency Medicine

## 2017-07-20 ENCOUNTER — Emergency Department (HOSPITAL_COMMUNITY)
Admission: EM | Admit: 2017-07-20 | Discharge: 2017-07-20 | Disposition: A | Discharge status: Home / Self Care | Payer: Self-pay | Attending: Emergency Medicine | Admitting: Emergency Medicine

## 2017-07-20 ENCOUNTER — Emergency Department (HOSPITAL_COMMUNITY): Payer: Self-pay

## 2017-07-20 ENCOUNTER — Other Ambulatory Visit: Payer: Self-pay

## 2017-07-20 DIAGNOSIS — R072 Precordial pain: Secondary | ICD-10-CM | POA: Insufficient documentation

## 2017-07-20 DIAGNOSIS — E1140 Type 2 diabetes mellitus with diabetic neuropathy, unspecified: Secondary | ICD-10-CM | POA: Insufficient documentation

## 2017-07-20 DIAGNOSIS — Z7982 Long term (current) use of aspirin: Secondary | ICD-10-CM | POA: Insufficient documentation

## 2017-07-20 DIAGNOSIS — Z794 Long term (current) use of insulin: Secondary | ICD-10-CM | POA: Insufficient documentation

## 2017-07-20 DIAGNOSIS — I2510 Atherosclerotic heart disease of native coronary artery without angina pectoris: Secondary | ICD-10-CM | POA: Insufficient documentation

## 2017-07-20 DIAGNOSIS — I10 Essential (primary) hypertension: Secondary | ICD-10-CM | POA: Insufficient documentation

## 2017-07-20 DIAGNOSIS — Z79899 Other long term (current) drug therapy: Secondary | ICD-10-CM | POA: Insufficient documentation

## 2017-07-20 LAB — PROTIME-INR
INR: 0.89
Prothrombin Time: 12 seconds (ref 11.4–15.2)

## 2017-07-20 LAB — CBC
HEMATOCRIT: 35.1 % — AB (ref 39.0–52.0)
Hemoglobin: 10.9 g/dL — ABNORMAL LOW (ref 13.0–17.0)
MCH: 27.8 pg (ref 26.0–34.0)
MCHC: 31.1 g/dL (ref 30.0–36.0)
MCV: 89.5 fL (ref 78.0–100.0)
Platelets: 427 10*3/uL — ABNORMAL HIGH (ref 150–400)
RBC: 3.92 MIL/uL — ABNORMAL LOW (ref 4.22–5.81)
RDW: 15 % (ref 11.5–15.5)
WBC: 12.4 10*3/uL — ABNORMAL HIGH (ref 4.0–10.5)

## 2017-07-20 LAB — BASIC METABOLIC PANEL
Anion gap: 8 (ref 5–15)
BUN: 16 mg/dL (ref 6–20)
CHLORIDE: 106 mmol/L (ref 98–111)
CO2: 24 mmol/L (ref 22–32)
Calcium: 8.9 mg/dL (ref 8.9–10.3)
Creatinine, Ser: 1.54 mg/dL — ABNORMAL HIGH (ref 0.61–1.24)
GFR calc Af Amer: >60 mL/min (ref >60)
GFR calc non Af Amer: 53 mL/min — ABNORMAL LOW (ref >60)
GLUCOSE: 214 mg/dL — AB (ref 70–99)
POTASSIUM: 3.6 mmol/L (ref 3.5–5.1)
Sodium: 138 mmol/L (ref 135–145)

## 2017-07-20 LAB — I-STAT TROPONIN, ED
Troponin i, poc: 0.02 ng/mL (ref 0.00–0.08)
Troponin i, poc: 0.04 ng/mL (ref 0.00–0.08)

## 2017-07-20 LAB — RAPID URINE DRUG SCREEN, HOSP PERFORMED
AMPHETAMINES: NOT DETECTED
BENZODIAZEPINES: NOT DETECTED
COCAINE: NOT DETECTED
Opiates: NOT DETECTED
Tetrahydrocannabinol: NOT DETECTED

## 2017-07-20 MED ORDER — FENTANYL CITRATE (PF) 100 MCG/2ML IJ SOLN
100.0000 ug | INTRAMUSCULAR | Status: DC | PRN
  Administered 2017-07-20 (×2): 100 ug via INTRAVENOUS
  Filled 2017-07-20 (×2): qty 2

## 2017-07-20 MED ORDER — ONDANSETRON HCL 4 MG/2ML IJ SOLN
4.0000 mg | Freq: Once | INTRAMUSCULAR | Status: AC | PRN
  Administered 2017-07-20: 4 mg via INTRAVENOUS
  Filled 2017-07-20: qty 2

## 2017-07-20 MED FILL — ?LIDOCAINE 5% PATCH: 5 | 30 days supply | Qty: 30 | Fill #0

## 2017-07-20 NOTE — Discharge Instructions (Addendum)
Follow-up as scheduled with your cardiologist.  Please be sure to take all your home medications.

## 2017-07-20 NOTE — ED Provider Notes (Signed)
[facility] EMERGENCY DEPARTMENT Provider Note   CSN: [id number] Arrival date & time: 07/20/17  0006     History   Chief Complaint Chief Complaint  Patient presents with  . Chest Pain    HPI Aaron Pearson is a 47 y.o. male.  The history is provided by the patient.  Chest Pain   This is a new problem. The current episode started 1 to 2 hours ago. The problem occurs constantly. The problem has not changed since onset.The pain is associated with rest. Pain location: right chest. The pain is severe. The quality of the pain is described as pressure-like. Radiates to: left chest. Associated symptoms include diaphoresis, nausea and shortness of breath. Pertinent negatives include no abdominal pain. He has tried nitroglycerin (ASA) for the symptoms. The treatment provided no relief. Risk factors include male gender.  His past medical history is significant for CAD, diabetes and hypertension.   Patient presents for chest pain.  He has extensive medical conditions including coronary artery disease, diabetes, hypertension.  He reports approximately 2 hours ago he began having right-sided chest pain at rest that radiated to his left chest.  Very similar to prior episodes of chest pain.  He has taken aspirin and nitroglycerin without any relief. Reports that he felt otherwise well earlier in the day Past Medical History:  Diagnosis Date  . Anxiety   . 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 Coronary artery disease   . Diabetes mellitus without complication ([place])   . GERD (gastroesophageal reflux disease)   . Headache   . Hx of diabetic neuropathy   . Hypertension   . Myocardial infarction ([place]) 2018  . Renal disorder     Patient Active Problem List   Diagnosis Date Noted  . Chest pain 07/14/2017  . Atypical chest pain 06/07/2017  . History of endocarditis   . Ischemic cardiomyopathy 06/06/2017  . Diabetic neuropathy ([place]) 05/29/2017  . Hypertension 05/16/2017  . Coronary  artery disease 05/16/2017  . Anxiety and depression 05/16/2017  . Chronic pain 05/16/2017  . GERD (gastroesophageal reflux disease) 05/16/2017  . Type 2 diabetes mellitus ([place]) 05/16/2017  . Unstable angina ([place]) 05/05/2017  . Precordial chest pain 05/04/2017    Past Surgical History:  Procedure Laterality Date  . CARDIAC CATHETERIZATION  05/05/2017  . CARPAL TUNNEL RELEASE    . CORONARY STENT INTERVENTION N/A 05/05/2017   Procedure: CORONARY STENT INTERVENTION;  Surgeon: Nigel Mormon, MD;  Location: [facility];  Service: Cardiovascular;  Laterality: N/A;  . CORONARY STENT PLACEMENT    . INTRAVASCULAR PRESSURE WIRE/FFR STUDY N/A 05/05/2017   Procedure: INTRAVASCULAR PRESSURE WIRE/FFR STUDY;  Surgeon: Nigel Mormon, MD;  Location: [facility];  Service: Cardiovascular;  Laterality: N/A;  . INTRAVASCULAR PRESSURE WIRE/FFR STUDY N/A 05/10/2017   Procedure: INTRAVASCULAR PRESSURE WIRE/FFR STUDY;  Surgeon: Nigel Mormon, MD;  Location: [facility];  Service: Cardiovascular;  Laterality: N/A;  . LEFT HEART CATH AND CORONARY ANGIOGRAPHY N/A 05/05/2017   Procedure: LEFT HEART CATH AND CORONARY ANGIOGRAPHY;  Surgeon: Nigel Mormon, MD;  Location: [facility];  Service: Cardiovascular;  Laterality: N/A;  . LEFT HEART CATH AND CORONARY ANGIOGRAPHY N/A 05/10/2017   Procedure: LEFT HEART CATH AND CORONARY ANGIOGRAPHY;  Surgeon: Nigel Mormon, MD;  Location: [facility];  Service: Cardiovascular;  Laterality: N/A;        Home Medications    Prior to Admission medications  Medication Sig Start Date End Date Taking? Authorizing Provider  acetaminophen (TYLENOL) 325 MG tablet Take 2 tablets (650 mg total) by mouth every 6 (six) hours as needed for moderate pain or headache. Patient not taking: Reported on 07/14/2017 05/06/17   Nigel Mormon, MD  amLODipine (NORVASC) 10 MG tablet Take 10 mg by mouth daily. 06/14/17   [provider]  aspirin EC 81 MG tablet Take 81 mg by mouth daily. 04/06/17   [provider]  atorvastatin (LIPITOR) 80 MG tablet Take 1 tablet (80 mg total) by mouth daily at 6 PM. 05/16/17 05/16/18  Charlott Rakes, MD  Blood Glucose Monitoring Suppl (TRUE METRIX METER) DEVI 1 each by Does not apply route 3 (three) times daily before meals. 05/16/17   Charlott Rakes, MD  diclofenac sodium (VOLTAREN) 1 % GEL Apply 4 g topically 4 (four) times daily. 05/16/17   Charlott Rakes, MD  DULoxetine (CYMBALTA) 60 MG capsule Take 1 capsule (60 mg total) by mouth daily. 05/16/17   Charlott Rakes, MD  furosemide (LASIX) 20 MG tablet Take 1 tablet (20 mg total) by mouth daily. 06/19/17   Charlott Rakes, MD  glucose blood (TRUE METRIX BLOOD GLUCOSE TEST) test strip 3 times daily before meals 05/16/17   Newlin, Enobong, MD  insulin aspart (NOVOLOG FLEXPEN) 100 UNIT/ML FlexPen Inject 10 Units into the skin 3 (three) times daily with meals. 05/16/17   Charlott Rakes, MD  Insulin Glargine (LANTUS SOLOSTAR) 100 UNIT/ML Solostar Pen Inject 42 Units into the skin daily at 10 pm. 06/06/17   Charlott Rakes, MD  isosorbide mononitrate (IMDUR) 30 MG 24 hr tablet Take 1 tablet (30 mg total) by mouth daily. 06/17/17   Doddridge Bing, DO  lidocaine (LIDODERM) 5 % PLACE 1 PATCH ONTO THE SKIN DAILY. REMOVE & DISCARD PATCH WITHIN 12 HOURS OR AS DIRECTED BY MD 07/19/17   Charlott Rakes, MD  lisinopril (PRINIVIL,ZESTRIL) 40 MG tablet Take 1 tablet (40 mg total) by mouth every morning. 05/16/17   Charlott Rakes, MD  methocarbamol (ROBAXIN) 500 MG tablet Take 1 tablet (500 mg total) by mouth every 8 (eight) hours as needed for muscle spasms. 06/06/17   Charlott Rakes, MD  metoprolol tartrate (LOPRESSOR) 100 MG tablet Take 1 tablet (100 mg total) by mouth 2 (two) times daily. 05/16/17 07/14/17  Charlott Rakes, MD  nitroGLYCERIN (NITROSTAT) 0.4 MG SL tablet Place 1 tablet (0.4 mg total) under the tongue See admin instructions. Place 1  tablet (0.4mg ) under tongue every 5 minutes as needed for chest pain. Call doctor/ 911 if take 2 doses. Max 3/Day 06/19/17   Charlott Rakes, MD  omeprazole (PRILOSEC) 20 MG capsule Take 20 mg by mouth daily.    [provider]  potassium chloride SA (K-DUR,KLOR-CON) 20 MEQ tablet Take 1 tablet (20 mEq total) by mouth daily. 05/16/17   Charlott Rakes, MD  pregabalin (LYRICA) 100 MG capsule Take 1 capsule (100 mg total) by mouth 2 (two) times daily. 06/15/17   Charlott Rakes, MD  spironolactone (ALDACTONE) 50 MG tablet Take 1 tablet (50 mg total) by mouth daily. 06/10/17   Guadalupe Dawn, MD  sucralfate (CARAFATE) 1 g tablet Take 1 tablet (1 g total) by mouth 4 (four) times daily -  with meals and at bedtime for 10 days. 05/14/17 07/14/17  Duffy Bruce, MD  ticagrelor (BRILINTA) 90 MG TABS tablet Take 1 tablet (90 mg total) by mouth 2 (two) times daily. 05/16/17   Charlott Rakes, MD  TRUEPLUS LANCETS 28G  MISC 1 each by Does not apply route 3 (three) times daily before meals. 05/16/17   Charlott Rakes, MD    Family History Family History  Problem Relation Age of Onset  . Coronary artery disease Mother   . Coronary artery disease Father     Social History Social History   Tobacco Use  . Smoking status: Never Smoker  . Smokeless tobacco: Never Used  Substance Use Topics  . Alcohol use: Never    Frequency: Never  . Drug use: Never     Allergies   Benadryl [diphenhydramine] and Penicillins   Review of Systems Review of Systems  Constitutional: Positive for diaphoresis.  Respiratory: Positive for shortness of breath.   Cardiovascular: Positive for chest pain.  Gastrointestinal: Positive for nausea. Negative for abdominal pain.  All other systems reviewed and are negative.    Physical Exam Updated Vital Signs BP (!) 165/102 (BP Location: Right Arm)   Pulse 94   Temp 98.1 F (36.7 C) (Oral)   Resp (!) 22   Ht 1.727 m (5\' 8" )   Wt 100.7 kg (222 lb)   SpO2 97%   BMI 33.75  kg/m   Physical Exam  CONSTITUTIONAL: Well developed/well nourished, appears mildly anxious HEAD: Normocephalic/atraumatic EYES: EOMI/PERRL ENMT: Mucous membranes moist NECK: supple no meningeal signs SPINE/BACK:entire spine nontender CV: S1/S2 noted, no murmurs/rubs/gallops noted LUNGS: Lungs are clear to auscultation bilaterally, no apparent distress ABDOMEN: soft, nontender, no rebound or guarding, bowel sounds noted throughout abdomen NEURO: Pt is awake/alert/appropriate, moves all extremitiesx4.  No facial droop.   EXTREMITIES: pulses normal/equal, full ROM, mild symmetric pitting edema lower extremities SKIN: warm, color normal PSYCH: no abnormalities of mood noted, alert and oriented to situation  ED Treatments / Results  Labs (all labs ordered are listed, but only abnormal results are displayed) Labs Reviewed  BASIC METABOLIC PANEL - Abnormal; Notable for the following components:      Result Value   Glucose, Bld 214 (*)    Creatinine, Ser 1.54 (*)    GFR calc non Af Amer 53 (*)    All other components within normal limits  CBC - Abnormal; Notable for the following components:   WBC 12.4 (*)    RBC 3.92 (*)    Hemoglobin 10.9 (*)    HCT 35.1 (*)    Platelets 427 (*)    All other components within normal limits  RAPID URINE DRUG SCREEN, HOSP PERFORMED - Abnormal; Notable for the following components:   Barbiturates   (*)    Value: Result not available. Reagent lot number recalled by manufacturer.   All other components within normal limits  PROTIME-INR  I-STAT TROPONIN, ED  I-STAT TROPONIN, ED    EKG EKG Interpretation  Date/Time:  Thursday July 20 2017 00:05:09 EDT Ventricular Rate:  94 PR Interval:    QRS Duration: 132 QT Interval:  409 QTC Calculation: 512 R Axis:   -28 Text Interpretation:  Sinus rhythm Probable left atrial enlargement Right bundle branch block No significant change since last tracing Compared to from EMS, no change Confirmed by  Ripley Fraise [phone]) on 07/20/2017 12:31:41 AM   Radiology Dg Chest 2 View  Result Date: 07/20/2017 CLINICAL DATA:  Chest pain EXAM: CHEST - 2 VIEW COMPARISON:  07/14/2017 FINDINGS: Mild prominence of basal lung markings that is stable. There is no edema, consolidation, effusion, or pneumothorax. Normal heart size and mediastinal contours. No acute osseous finding. IMPRESSION: No acute finding or change from prior. Electronically Signed  By: Monte Fantasia M.D.   On: 07/20/2017 00:53    Procedures Procedures   Medications Ordered in ED Medications  fentaNYL (SUBLIMAZE) injection 100 mcg (100 mcg Intravenous Given 07/20/17 0501)  ondansetron (ZOFRAN) injection 4 mg (4 mg Intravenous Given 07/20/17 0144)     Initial Impression / Assessment and Plan / ED Course  I have reviewed the triage vital signs and the nursing notes.  Pertinent labs & imaging results that were available during my care of the patient were reviewed by me and considered in my medical decision making (see chart for details).     1:03 AM Patient with extensive history of CAD.  He had a cardiac cath May 2019, that revealed diffuse CAD, but no stent thrombosis or restenosis.  It was recommended aggressive medical management at that time. Tonight presents for similar type chest pain. His initial EKG is unchanged, his prehospital EKG is also unchanged. His initial troponin is also negative. He was just discharged from the hospital July 6. Other work-up is pending at this time. 7:14 AM No dynamic change of his troponin.  Patient is resting comfortably.  I discussed the case with on-call cardiologist Dr. Einar Gip.  He knows patient well.  If troponins are not newly elevated, he can be discharged.  He already has follow-up as an outpatient.  I doubt ACS/PE/dissection at this time  Final Clinical Impressions(s) / ED Diagnoses   Final diagnoses:  Precordial pain    ED Discharge Orders    None       Ripley Fraise, MD 07/20/17 [phone]

## 2017-07-20 NOTE — ED Triage Notes (Signed)
Pt BIB [facility] for chest pain. Pt states he was laying in bed when he began to have pain in his upper right chest area with sweating and nausea. EMS advised they gave 324mg  of baby ASA, 2 SL Nitro, and 4 mg of zofran for nausea. EMS started a 20g Saline Lock in pts left AC. EMS reports their 12 lead EKG was unremarkable.

## 2017-07-24 ENCOUNTER — Encounter: Payer: Self-pay | Admitting: Family Medicine

## 2017-07-24 ENCOUNTER — Encounter: Payer: Self-pay | Admitting: Pediatric Intensive Care

## 2017-07-24 ENCOUNTER — Ambulatory Visit: Payer: Self-pay | Attending: Family Medicine | Admitting: Family Medicine

## 2017-07-24 ENCOUNTER — Telehealth: Payer: Self-pay

## 2017-07-24 VITALS — BP 164/100 | HR 111 | Temp 98.2°F | Resp 18 | Ht 68.0 in | Wt 227.0 lb

## 2017-07-24 DIAGNOSIS — Z888 Allergy status to other drugs, medicaments and biological substances status: Secondary | ICD-10-CM | POA: Insufficient documentation

## 2017-07-24 DIAGNOSIS — G8929 Other chronic pain: Secondary | ICD-10-CM | POA: Insufficient documentation

## 2017-07-24 DIAGNOSIS — K219 Gastro-esophageal reflux disease without esophagitis: Secondary | ICD-10-CM | POA: Insufficient documentation

## 2017-07-24 DIAGNOSIS — Z955 Presence of coronary angioplasty implant and graft: Secondary | ICD-10-CM | POA: Insufficient documentation

## 2017-07-24 DIAGNOSIS — Z79899 Other long term (current) drug therapy: Secondary | ICD-10-CM | POA: Insufficient documentation

## 2017-07-24 DIAGNOSIS — Z794 Long term (current) use of insulin: Secondary | ICD-10-CM | POA: Insufficient documentation

## 2017-07-24 DIAGNOSIS — Z7982 Long term (current) use of aspirin: Secondary | ICD-10-CM | POA: Insufficient documentation

## 2017-07-24 DIAGNOSIS — Z8639 Personal history of other endocrine, nutritional and metabolic disease: Secondary | ICD-10-CM | POA: Insufficient documentation

## 2017-07-24 DIAGNOSIS — E1149 Type 2 diabetes mellitus with other diabetic neurological complication: Secondary | ICD-10-CM

## 2017-07-24 DIAGNOSIS — I10 Essential (primary) hypertension: Secondary | ICD-10-CM | POA: Insufficient documentation

## 2017-07-24 DIAGNOSIS — I252 Old myocardial infarction: Secondary | ICD-10-CM | POA: Insufficient documentation

## 2017-07-24 DIAGNOSIS — E1169 Type 2 diabetes mellitus with other specified complication: Secondary | ICD-10-CM | POA: Insufficient documentation

## 2017-07-24 DIAGNOSIS — Z880 Allergy status to penicillin: Secondary | ICD-10-CM | POA: Insufficient documentation

## 2017-07-24 DIAGNOSIS — I25110 Atherosclerotic heart disease of native coronary artery with unstable angina pectoris: Secondary | ICD-10-CM | POA: Insufficient documentation

## 2017-07-24 DIAGNOSIS — E1140 Type 2 diabetes mellitus with diabetic neuropathy, unspecified: Secondary | ICD-10-CM | POA: Insufficient documentation

## 2017-07-24 DIAGNOSIS — F419 Anxiety disorder, unspecified: Secondary | ICD-10-CM | POA: Insufficient documentation

## 2017-07-24 DIAGNOSIS — I200 Unstable angina: Secondary | ICD-10-CM

## 2017-07-24 LAB — GLUCOSE, POCT (MANUAL RESULT ENTRY): POC GLUCOSE: 357 mg/dL — AB (ref 70–99)

## 2017-07-24 MED ORDER — INSULIN ASPART 100 UNIT/ML ~~LOC~~ SOLN
6.0000 [IU] | Freq: Once | SUBCUTANEOUS | Status: AC
  Administered 2017-07-24: 6 [IU] via SUBCUTANEOUS

## 2017-07-24 MED ORDER — LIDOCAINE 5 % EX PTCH: 1.0000 | MEDICATED_PATCH | CUTANEOUS | 3 refills | Status: DC

## 2017-07-24 MED ORDER — CLONIDINE HCL 0.1 MG PO TABS
0.1000 mg | ORAL_TABLET | Freq: Once | ORAL | Status: AC
Start: 2017-07-24 — End: 2017-07-24
  Administered 2017-07-24: 0.1 mg via ORAL

## 2017-07-24 MED ORDER — GABAPENTIN 300 MG PO CAPS: 300.0000 mg | ORAL_CAPSULE | Freq: Three times a day (TID) | ORAL | 3 refills | Status: DC

## 2017-07-24 NOTE — Telephone Encounter (Signed)
Call received from [facility]. She inquired about the status of the patient's [employer].  The patient would like to attend cardiac rehab but needs to have CAFA in place. He currently has no insurance. Informed her that the [facility] Financial Counselor will meet with the patient if possible when the patient is at the clinic this afternoon and review next steps for the patient. The patient currently does not have a working  phone. [place] also noted that the patient did keep his ophthalmology appointment as part of his disability application.   The patient met with Wynonia Hazard, [facility] Financial Counselor who has been in contact with [employer]. Clifton James has requested that the hospital financial counselor contact the patient tomorrow to discuss the medicaid pending status. As of now, [facility] is not able to process the [employer] and [employer] with a medicaid pending status. Due to the fact that the patient has no working phone, Clifton James has instructed the hospital financial counselor to call Lisette Abu, [place] tomorrow who will be at the [facility] and she can attempt to have the patient present for the call.  The patient was in agreement with the plan   Call placed to [facility], [place] and informed her that the hospital financial counselor should be contacting her tomorrow in order to speak to the patient.

## 2017-07-24 NOTE — Congregational Nurse Program (Signed)
Congregational Nurse Program Note  Date of Encounter: 07/24/2017  Past Medical History: Past Medical History:  Diagnosis Date  . Anxiety   . 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 Coronary artery disease   . Diabetes mellitus without complication ([place])   . GERD (gastroesophageal reflux disease)   . Headache   . Hx of diabetic neuropathy   . Hypertension   . Myocardial infarction ([place]) 2018  . Renal disorder     Encounter Details: CNP Questionnaire - 07/24/17 0945      Questionnaire   Patient Status  Not Applicable    Race  White or Caucasian    Location Patient Served At  [facility]  Not Applicable    Uninsured  Uninsured (Subsequent visits/quarter)    Food  No food insecurities    Housing/Utilities  No permanent housing    Transportation  No transportation needs    Medication  No medication insecurities    Medical Provider  Yes    Referrals  Primary Care Provider/Clinic;Area Agency    ED Visit Averted  Yes    Life-Saving Intervention Made  Not Applicable     BP check- client states that he was able to go to eye appointment last week. He was told that he would need referral to specialty eye doctor. Client is unable to link to [facility] due to unknown financial assistance status. CN will contact JaneBrazeau CM at [facility] as client has appointment today. Client states that he went to ED with C/o chest pain on 7/11. He states that the chest pain never really resolves. His troponin was stable in ED. Client states he will go to appointment this afternoon and will speak to [facility] staff re: financial assistance matter. He has not received notification regarding Medicaid approval. He will call DSS to folow up on that today. Client declined taxi and states he will be fine riding the bus. Will follow up in Cn clinic tomorrow.

## 2017-07-24 NOTE — Patient Instructions (Signed)
Chest Wall Pain °Chest wall pain is pain in or around the bones and muscles of your chest. Sometimes, an injury causes this pain. Sometimes, the cause may not be known. This pain may take several weeks or longer to get better. °Follow these instructions at home: °Pay attention to any changes in your symptoms. Take these actions to help with your pain: °· Rest as told by your doctor. °· Avoid activities that cause pain. Try not to use your chest, belly (abdominal), or side muscles to lift heavy things. °· If directed, apply ice to the painful area: °? Put ice in a plastic bag. °? Place a towel between your skin and the bag. °? Leave the ice on for 20 minutes, 2-3 times per day. °· Take over-the-counter and prescription medicines only as told by your doctor. °· Do not use tobacco products, including cigarettes, chewing tobacco, and e-cigarettes. If you need help quitting, ask your doctor. °· Keep all follow-up visits as told by your doctor. This is important. ° °Contact a doctor if: °· You have a fever. °· Your chest pain gets worse. °· You have new symptoms. °Get help right away if: °· You feel sick to your stomach (nauseous) or you throw up (vomit). °· You feel sweaty or light-headed. °· You have a cough with phlegm (sputum) or you cough up blood. °· You are short of breath. °This information is not intended to replace advice given to you by your health care provider. Make sure you discuss any questions you have with your health care provider. °Document Released: 06/15/2007 Document Revised: 06/04/2015 Document Reviewed: 03/24/2014 °Elsevier Interactive Patient Education © 2018 [employer] ° °

## 2017-07-24 NOTE — Progress Notes (Signed)
Subjective:  Patient ID: Aaron Pearson, male    DOB: 07-Mar-1970  Age: 47 y.o. MRN: [id number]  CC: Follow-up and Diabetes   HPI Aaron Pearson is a 47 year old male with a history of type 2 diabetes mellitus (A1c 10.7), hypertension, coronary artery disease (status post multiple prior PCI) with most recent PCI for unstable angina to his left circumflex and LAD on 05/05/17.  Repeat cardiac cath on 05/10/2017 revealed no in stent thrombosis recommendation was for dual antiplatelet therapy with aspirin and Brilinta for 1 year. He has had several hospitalizations for unstable angina the last of which was from 07/14/2017 through 07/15/2017 where he had presented with chest pains, cardiac enzymes were 0.38, 0.38 and 0.36 on chest pain was thought to be musculoskeletal as per cardiology. He had an ED visit 6 days later with similar symptoms with low suspicion for ACS, PE and he was subsequently discharged after discussing with cardiology.  He presents today informing me he has persistent chest pain that radiates to his left upper extremity and has been unable to see his cardiologist Dr Einar Gip lack of medical coverage; denies shortness of breath, palpitations or diaphoresis.  He also has neuropathic pain in his bilateral lower extremities and complains Lyrica is not working and he would like to be placed back on gabapentin. He informs me the medications he received during hospitalization helped his chest pain but his current outpatient medications have been ineffective.  He has been unable to see pain management due to lack of medical coverage. His blood sugar is 357 in the clinic today and he endorses compliance with his medications; his blood pressure is elevated at 186/122 which he attributes to pain.  Past Medical History:  Diagnosis Date  . Anxiety   . 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 Coronary artery disease   . Diabetes mellitus without complication ([place])   . GERD (gastroesophageal reflux disease)   . Headache    . Hx of diabetic neuropathy   . Hypertension   . Myocardial infarction ([place]) 2018  . Renal disorder     Past Surgical History:  Procedure Laterality Date  . CARDIAC CATHETERIZATION  05/05/2017  . CARPAL TUNNEL RELEASE    . CORONARY STENT INTERVENTION N/A 05/05/2017   Procedure: CORONARY STENT INTERVENTION;  Surgeon: Nigel Mormon, MD;  Location: [place] [facility];  Service: Cardiovascular;  Laterality: N/A;  . CORONARY STENT PLACEMENT    . INTRAVASCULAR PRESSURE WIRE/FFR STUDY N/A 05/05/2017   Procedure: INTRAVASCULAR PRESSURE WIRE/FFR STUDY;  Surgeon: Nigel Mormon, MD;  Location: [place] [facility];  Service: Cardiovascular;  Laterality: N/A;  . INTRAVASCULAR PRESSURE WIRE/FFR STUDY N/A 05/10/2017   Procedure: INTRAVASCULAR PRESSURE WIRE/FFR STUDY;  Surgeon: Nigel Mormon, MD;  Location: [place] [facility];  Service: Cardiovascular;  Laterality: N/A;  . LEFT HEART CATH AND CORONARY ANGIOGRAPHY N/A 05/05/2017   Procedure: LEFT HEART CATH AND CORONARY ANGIOGRAPHY;  Surgeon: Nigel Mormon, MD;  Location: [place] [facility];  Service: Cardiovascular;  Laterality: N/A;  . LEFT HEART CATH AND CORONARY ANGIOGRAPHY N/A 05/10/2017   Procedure: LEFT HEART CATH AND CORONARY ANGIOGRAPHY;  Surgeon: Nigel Mormon, MD;  Location: [place] [facility];  Service: Cardiovascular;  Laterality: N/A;    Allergies  Allergen Reactions  . Benadryl [Diphenhydramine] Nausea Only  . Penicillins Other (See Comments)    Childhood allergy  Has patient had a PCN reaction causing immediate rash, facial/tongue/throat swelling, SOB or lightheadedness with hypotension: Unknown  Has patient had a PCN reaction causing severe rash involving mucus membranes or skin necrosis: Unknown Has patient had a PCN reaction that required hospitalization: Unknown Has patient had a PCN reaction occurring within the last 10 years: No If all of the above answers are "NO", then may proceed with Cephalosporin  use.      Outpatient Medications Prior to Visit  Medication Sig Dispense Refill  . acetaminophen (TYLENOL) 325 MG tablet Take 2 tablets (650 mg total) by mouth every 6 (six) hours as needed for moderate pain or headache. 90 tablet 3  . amLODipine (NORVASC) 10 MG tablet Take 10 mg by mouth daily.  3  . aspirin EC 81 MG tablet Take 81 mg by mouth daily.    Marland Kitchen atorvastatin (LIPITOR) 80 MG tablet Take 1 tablet (80 mg total) by mouth daily at 6 PM. 30 tablet 3  . Blood Glucose Monitoring Suppl (TRUE METRIX METER) DEVI 1 each by Does not apply route 3 (three) times daily before meals. 1 Device 0  . diclofenac sodium (VOLTAREN) 1 % GEL Apply 4 g topically 4 (four) times daily. 100 g 1  . DULoxetine (CYMBALTA) 60 MG capsule Take 1 capsule (60 mg total) by mouth daily. 30 capsule 3  . furosemide (LASIX) 20 MG tablet Take 1 tablet (20 mg total) by mouth daily. 30 tablet 3  . glucose blood (TRUE METRIX BLOOD GLUCOSE TEST) test strip 3 times daily before meals 100 each 12  . insulin aspart (NOVOLOG FLEXPEN) 100 UNIT/ML FlexPen Inject 10 Units into the skin 3 (three) times daily with meals. 30 mL 3  . Insulin Glargine (LANTUS SOLOSTAR) 100 UNIT/ML Solostar Pen Inject 42 Units into the skin daily at 10 pm. 5 pen 3  . isosorbide mononitrate (IMDUR) 30 MG 24 hr tablet Take 1 tablet (30 mg total) by mouth daily. 30 tablet 0  . lisinopril (PRINIVIL,ZESTRIL) 40 MG tablet Take 1 tablet (40 mg total) by mouth every morning. (Patient taking differently: Take 40 mg by mouth daily. ) 30 tablet 3  . methocarbamol (ROBAXIN) 500 MG tablet Take 1 tablet (500 mg total) by mouth every 8 (eight) hours as needed for muscle spasms. 90 tablet 3  . metoprolol tartrate (LOPRESSOR) 100 MG tablet Take 1 tablet (100 mg total) by mouth 2 (two) times daily. 60 tablet 3  . nitroGLYCERIN (NITROSTAT) 0.4 MG SL tablet Place 1 tablet (0.4 mg total) under the tongue See admin instructions. Place 1 tablet (0.4mg ) under tongue every 5 minutes  as needed for chest pain. Call doctor/ 911 if take 2 doses. Max 3/Day 30 tablet 3  . omeprazole (PRILOSEC) 20 MG capsule Take 20 mg by mouth daily.    . potassium chloride SA (K-DUR,KLOR-CON) 20 MEQ tablet Take 1 tablet (20 mEq total) by mouth daily. 30 tablet 3  . pregabalin (LYRICA) 100 MG capsule Take 1 capsule (100 mg total) by mouth 2 (two) times daily. (Patient taking differently: Take 100 mg by mouth 2 (two) times daily. ) 180 capsule 3  . spironolactone (ALDACTONE) 50 MG tablet Take 1 tablet (50 mg total) by mouth daily. 30 tablet 0  . ticagrelor (BRILINTA) 90 MG TABS tablet Take 1 tablet (90 mg total) by mouth 2 (two) times daily. 180 tablet 3  . TRUEPLUS LANCETS 28G MISC 1 each by Does not apply route 3 (three) times daily before meals. 100 each 12  . lidocaine (LIDODERM) 5 % PLACE 1 PATCH ONTO THE SKIN DAILY. REMOVE & DISCARD [facility]  WITHIN 12 HOURS OR AS DIRECTED BY MD 30 patch 1  . sucralfate (CARAFATE) 1 g tablet Take 1 tablet (1 g total) by mouth 4 (four) times daily -  with meals and at bedtime for 10 days. (Patient not taking: Reported on 07/20/2017) 40 tablet 0   No facility-administered medications prior to visit.     ROS Review of Systems  Constitutional: Negative for activity change and appetite change.  HENT: Negative for sinus pressure and sore throat.   Eyes: Negative for visual disturbance.  Respiratory: Negative for cough, chest tightness and shortness of breath.   Cardiovascular: Positive for chest pain. Negative for leg swelling.  Gastrointestinal: Negative for abdominal distention, abdominal pain, constipation and diarrhea.  Endocrine: Negative.   Genitourinary: Negative for dysuria.  Musculoskeletal: Negative for joint swelling and myalgias.  Skin: Negative for rash.  Allergic/Immunologic: Negative.   Neurological: Positive for numbness. Negative for weakness and light-headedness.  Psychiatric/Behavioral: Negative for dysphoric mood and suicidal ideas.     Objective:  BP (!) 188/115 (BP Location: Left Arm, Patient Position: Sitting, Cuff Size: Normal)   Pulse (!) 111   Temp 98.2 F (36.8 C) (Oral)   Resp 18   Ht 5\' 8"  (1.727 m)   Wt 227 lb (103 kg)   SpO2 96%   BMI 34.52 kg/m   BP/Weight 07/24/2017 07/24/2017 07/25/9676  Systolic BP 938 101 751  Diastolic BP 025 852 778  Wt. (Lbs) - 227 222  BMI - 34.52 33.75      Physical Exam  Constitutional: He is oriented to person, place, and time. He appears well-developed and well-nourished.  Neck: No JVD present.  Cardiovascular: Normal rate, normal heart sounds and intact distal pulses.  No murmur heard. Pulmonary/Chest: Effort normal and breath sounds normal. He has no wheezes. He has no rales. He exhibits no tenderness.  Abdominal: Soft. Bowel sounds are normal. He exhibits no distension and no mass. There is no tenderness.  Musculoskeletal: Normal range of motion.  Neurological: He is alert and oriented to person, place, and time.  Skin: Skin is warm and dry.  Psychiatric: He has a normal mood and affect.    CMP Latest Ref Rng & Units 07/20/2017 07/15/2017 07/14/2017  Glucose 70 - 99 mg/dL 214(H) - 175(H)  BUN 6 - 20 mg/dL 16 - 17  Creatinine 0.61 - 1.24 mg/dL 1.54(H) 1.49(H) 1.53(H)  Sodium 135 - 145 mmol/L 138 - 139  Potassium 3.5 - 5.1 mmol/L 3.6 - 4.3  Chloride 98 - 111 mmol/L 106 - 108  CO2 22 - 32 mmol/L 24 - 21(L)  Calcium 8.9 - 10.3 mg/dL 8.9 - 8.8(L)  Total Protein 6.0 - 8.5 g/dL - - -  Total Bilirubin 0.0 - 1.2 mg/dL - - -  Alkaline Phos 39 - 117 IU/L - - -  AST 0 - 40 IU/L - - -  ALT 0 - 44 IU/L - - -    Lab Results  Component Value Date   HGBA1C 10.7 05/16/2017     Lab Results  Component Value Date   HGBA1C 10.7 05/16/2017     Assessment & Plan:   1. Type 2 diabetes mellitus with other specified complication, with long-term current use of insulin (HCC) Uncontrolled with A1c of 10.7 CBG of 357-NovoLog 6 units administered in the clinic and patient  observed for 30 minutes after Counseled on Diabetic diet, my plate method, 242 minutes of moderate intensity exercise/week Keep blood sugar logs with fasting goals of 80-120 mg/dl,  random of less than 180 and in the event of sugars less than 60 mg/dl or greater than 400 mg/dl please notify the clinic ASAP. It is recommended that you undergo annual eye exams and annual foot exams. Pneumonia vaccine is recommended. - Glucose (CBG) - insulin aspart (novoLOG) injection 6 Units  2. Essential hypertension Uncontrolled; clonidine 0.1 mg administered and blood pressure repeated after 30 minutes He attributes symptoms to pain; blood pressure was in the 140s at his last office visit No regimen change - cloNIDine (CATAPRES) tablet 0.1 mg  3. Other chronic pain He does have unstable angina as well as pain from diabetic neuropathy Has been out of lidocaine patches which I have refilled - lidocaine (LIDODERM) 5 %; Place 1 patch onto the skin daily. Remove & Discard patch within 12 hours or as directed by MD  Dispense: 30 patch; Refill: 3  4. Coronary artery disease involving native coronary artery of native heart with unstable angina pectoris [facility]) Ongoing angina with recurrent ED presentations and hospitalizations Advised to use nitroglycerin as needed and report to the ED if symptoms are resolving Unable to see cardiology due to lack of medical coverage Advised to apply for the current financial discount  5. Other diabetic neurological complication associated with type 2 diabetes mellitus ([place]) Uncontrolled on Lyrica We will switch back to gabapentin as per patient request - gabapentin (NEURONTIN) 300 MG capsule; Take 1 capsule (300 mg total) by mouth 3 (three) times daily.  Dispense: 90 capsule; Refill: 3   Meds ordered this encounter  Medications  . insulin aspart (novoLOG) injection 6 Units  . cloNIDine (CATAPRES) tablet 0.1 mg  . gabapentin (NEURONTIN) 300 MG capsule    Sig: Take 1  capsule (300 mg total) by mouth 3 (three) times daily.    Dispense:  90 capsule    Refill:  3    Discontinue Lyrica  . lidocaine (LIDODERM) 5 %    Sig: Place 1 patch onto the skin daily. Remove & Discard patch within 12 hours or as directed by MD    Dispense:  30 patch    Refill:  3    Follow-up: Return in about 2 months (around 09/24/2017) for Follow-up of chronic medical conditions.   Charlott Rakes MD

## 2017-07-25 ENCOUNTER — Emergency Department (HOSPITAL_COMMUNITY)
Admission: EM | Admit: 2017-07-25 | Discharge: 2017-07-26 | Disposition: A | Discharge status: Home / Self Care | Payer: Self-pay | Attending: Emergency Medicine | Admitting: Emergency Medicine

## 2017-07-25 ENCOUNTER — Other Ambulatory Visit: Payer: Self-pay

## 2017-07-25 ENCOUNTER — Encounter: Payer: Self-pay | Admitting: Pediatric Intensive Care

## 2017-07-25 ENCOUNTER — Encounter (HOSPITAL_COMMUNITY): Payer: Self-pay

## 2017-07-25 ENCOUNTER — Emergency Department (HOSPITAL_COMMUNITY): Payer: Self-pay

## 2017-07-25 DIAGNOSIS — Z7982 Long term (current) use of aspirin: Secondary | ICD-10-CM | POA: Insufficient documentation

## 2017-07-25 DIAGNOSIS — I10 Essential (primary) hypertension: Secondary | ICD-10-CM | POA: Insufficient documentation

## 2017-07-25 DIAGNOSIS — R079 Chest pain, unspecified: Secondary | ICD-10-CM | POA: Insufficient documentation

## 2017-07-25 DIAGNOSIS — E119 Type 2 diabetes mellitus without complications: Secondary | ICD-10-CM | POA: Insufficient documentation

## 2017-07-25 DIAGNOSIS — Z794 Long term (current) use of insulin: Secondary | ICD-10-CM | POA: Insufficient documentation

## 2017-07-25 DIAGNOSIS — I2510 Atherosclerotic heart disease of native coronary artery without angina pectoris: Secondary | ICD-10-CM | POA: Insufficient documentation

## 2017-07-25 DIAGNOSIS — Z79899 Other long term (current) drug therapy: Secondary | ICD-10-CM | POA: Insufficient documentation

## 2017-07-25 LAB — BASIC METABOLIC PANEL
Anion gap: 8 (ref 5–15)
BUN: 14 mg/dL (ref 6–20)
CALCIUM: 8.7 mg/dL — AB (ref 8.9–10.3)
CO2: 25 mmol/L (ref 22–32)
Chloride: 105 mmol/L (ref 98–111)
Creatinine, Ser: 1.43 mg/dL — ABNORMAL HIGH (ref 0.61–1.24)
GFR calc Af Amer: >60 mL/min (ref >60)
GFR, EST NON AFRICAN AMERICAN: 58 mL/min — AB (ref >60)
GLUCOSE: 149 mg/dL — AB (ref 70–99)
Potassium: 3.9 mmol/L (ref 3.5–5.1)
Sodium: 138 mmol/L (ref 135–145)

## 2017-07-25 LAB — CBC
HEMATOCRIT: 33.8 % — AB (ref 39.0–52.0)
Hemoglobin: 10.9 g/dL — ABNORMAL LOW (ref 13.0–17.0)
MCH: 28.5 pg (ref 26.0–34.0)
MCHC: 32.2 g/dL (ref 30.0–36.0)
MCV: 88.5 fL (ref 78.0–100.0)
Platelets: 394 10*3/uL (ref 150–400)
RBC: 3.82 MIL/uL — ABNORMAL LOW (ref 4.22–5.81)
RDW: 14.6 % (ref 11.5–15.5)
WBC: 12.6 10*3/uL — ABNORMAL HIGH (ref 4.0–10.5)

## 2017-07-25 LAB — I-STAT TROPONIN, ED: Troponin i, poc: 0.02 ng/mL (ref 0.00–0.08)

## 2017-07-25 NOTE — ED Notes (Signed)
Patient transported to X-ray 

## 2017-07-25 NOTE — ED Triage Notes (Signed)
Per [facility] pt arrives from home w c/o sharp left sided 10/10 CP radiating down left arm starting at 1300. Pt took 324 ASA and 2 NTG prior to EMS arrival. BP on arrival 210/132 and last BP of 180/102. EMS gave 2 NTG. Pt reports pain now 9/10. Pt endorses leg swelling x 1day. Pt hx of MI w 9 stents and RBBB on EKG. Pt alert and oriented.

## 2017-07-25 NOTE — ED Provider Notes (Signed)
[place] EMERGENCY DEPARTMENT Provider Note   CSN: [id number] Arrival date & time: 07/25/17  2241     History   Chief Complaint Chief Complaint  Patient presents with  . Chest Pain    HPI Cloyd Ragas is a 47 y.o. male.  The history is provided by the patient and medical records.     47 year old male with history of anxiety, arthritis, coronary artery disease, history of MI status post CABG and multiple stenting, headaches, neuropathy, hypertension, presenting to the ED for chest pain.  Patient reports pain began today around 1 PM when he was outside walking around.  States pain is been intermittent since that time.  He describes it as left-sided with some radiation into the left arm.  Reports some shortness of breath when pain is severe but denies diaphoresis, nausea, dizziness, weakness, palpitations.  States he was given aspirin by EMS and took his nitroglycerin without much improvement.  His blood pressure was initially elevated but has since improved, 160/99 during my exam.  Patient is not a smoker.  Of note, patient admitted earlier in the month with elevated troponins.  These remain flat during his hospitalization and cardiology felt this was most likely musculoskeletal.  Patient seen in the ED again on 07/20/2017 for chest pain with negative work-up.  Had recent cardiac catheterization in May 2019 with diffuse disease but no recurrent stenosis of his stents or no blockages.  Patient is followed by cardiology, Dr. Einar Gip.  Past Medical History:  Diagnosis Date  . Anxiety   . 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 Coronary artery disease   . Diabetes mellitus without complication ([place])   . GERD (gastroesophageal reflux disease)   . Headache   . Hx of diabetic neuropathy   . Hypertension   . Myocardial infarction ([place]) 2018  . Renal disorder     Patient Active Problem List   Diagnosis Date Noted  . Chest pain 07/14/2017  . Atypical chest pain 06/07/2017  .  History of endocarditis   . Ischemic cardiomyopathy 06/06/2017  . Diabetic neuropathy ([place]) 05/29/2017  . Hypertension 05/16/2017  . Coronary artery disease 05/16/2017  . Anxiety and depression 05/16/2017  . Chronic pain 05/16/2017  . GERD (gastroesophageal reflux disease) 05/16/2017  . Type 2 diabetes mellitus ([place]) 05/16/2017  . Unstable angina ([place]) 05/05/2017  . Precordial chest pain 05/04/2017    Past Surgical History:  Procedure Laterality Date  . CARDIAC CATHETERIZATION  05/05/2017  . CARPAL TUNNEL RELEASE    . CORONARY STENT INTERVENTION N/A 05/05/2017   Procedure: CORONARY STENT INTERVENTION;  Surgeon: Nigel Mormon, MD;  Location: [place] [facility];  Service: Cardiovascular;  Laterality: N/A;  . CORONARY STENT PLACEMENT    . INTRAVASCULAR PRESSURE WIRE/FFR STUDY N/A 05/05/2017   Procedure: INTRAVASCULAR PRESSURE WIRE/FFR STUDY;  Surgeon: Nigel Mormon, MD;  Location: [place] [facility];  Service: Cardiovascular;  Laterality: N/A;  . INTRAVASCULAR PRESSURE WIRE/FFR STUDY N/A 05/10/2017   Procedure: INTRAVASCULAR PRESSURE WIRE/FFR STUDY;  Surgeon: Nigel Mormon, MD;  Location: [place] [facility];  Service: Cardiovascular;  Laterality: N/A;  . LEFT HEART CATH AND CORONARY ANGIOGRAPHY N/A 05/05/2017   Procedure: LEFT HEART CATH AND CORONARY ANGIOGRAPHY;  Surgeon: Nigel Mormon, MD;  Location: [place] [facility];  Service: Cardiovascular;  Laterality: N/A;  . LEFT HEART CATH AND CORONARY ANGIOGRAPHY N/A 05/10/2017   Procedure: LEFT HEART CATH AND CORONARY ANGIOGRAPHY;  Surgeon: Nigel Mormon, MD;  Location: [place] [facility];  Service: Cardiovascular;  Laterality: N/A;        Home Medications    Prior to Admission medications   Medication Sig Start Date End Date Taking? Authorizing Provider  acetaminophen (TYLENOL) 325 MG tablet Take 2 tablets (650 mg total) by mouth every 6 (six) hours as needed for moderate pain or headache. 05/06/17    Patwardhan, Manish J, MD  amLODipine (NORVASC) 10 MG tablet Take 10 mg by mouth daily. 06/14/17   [provider]  aspirin EC 81 MG tablet Take 81 mg by mouth daily. 04/06/17   [provider]  atorvastatin (LIPITOR) 80 MG tablet Take 1 tablet (80 mg total) by mouth daily at 6 PM. 05/16/17 05/16/18  Charlott Rakes, MD  Blood Glucose Monitoring Suppl (TRUE METRIX METER) DEVI 1 each by Does not apply route 3 (three) times daily before meals. 05/16/17   Charlott Rakes, MD  diclofenac sodium (VOLTAREN) 1 % GEL Apply 4 g topically 4 (four) times daily. 05/16/17   Charlott Rakes, MD  DULoxetine (CYMBALTA) 60 MG capsule Take 1 capsule (60 mg total) by mouth daily. 05/16/17   Charlott Rakes, MD  furosemide (LASIX) 20 MG tablet Take 1 tablet (20 mg total) by mouth daily. 06/19/17   Charlott Rakes, MD  gabapentin (NEURONTIN) 300 MG capsule Take 1 capsule (300 mg total) by mouth 3 (three) times daily. 07/24/17   Charlott Rakes, MD  glucose blood (TRUE METRIX BLOOD GLUCOSE TEST) test strip 3 times daily before meals 05/16/17   Newlin, Enobong, MD  insulin aspart (NOVOLOG FLEXPEN) 100 UNIT/ML FlexPen Inject 10 Units into the skin 3 (three) times daily with meals. 05/16/17   Charlott Rakes, MD  Insulin Glargine (LANTUS SOLOSTAR) 100 UNIT/ML Solostar Pen Inject 42 Units into the skin daily at 10 pm. 06/06/17   Charlott Rakes, MD  isosorbide mononitrate (IMDUR) 30 MG 24 hr tablet Take 1 tablet (30 mg total) by mouth daily. 06/17/17   Florence Bing, DO  lidocaine (LIDODERM) 5 % Place 1 patch onto the skin daily. Remove & Discard patch within 12 hours or as directed by MD 07/24/17   Charlott Rakes, MD  lisinopril (PRINIVIL,ZESTRIL) 40 MG tablet Take 1 tablet (40 mg total) by mouth every morning. Patient taking differently: Take 40 mg by mouth daily.  05/16/17   Charlott Rakes, MD  methocarbamol (ROBAXIN) 500 MG tablet Take 1 tablet (500 mg total) by mouth every 8 (eight) hours as needed for muscle spasms.  06/06/17   Charlott Rakes, MD  metoprolol tartrate (LOPRESSOR) 100 MG tablet Take 1 tablet (100 mg total) by mouth 2 (two) times daily. 05/16/17 07/20/24  Charlott Rakes, MD  nitroGLYCERIN (NITROSTAT) 0.4 MG SL tablet Place 1 tablet (0.4 mg total) under the tongue See admin instructions. Place 1 tablet (0.4mg ) under tongue every 5 minutes as needed for chest pain. Call doctor/ 911 if take 2 doses. Max 3/Day 06/19/17   Charlott Rakes, MD  omeprazole (PRILOSEC) 20 MG capsule Take 20 mg by mouth daily.    [provider]  potassium chloride SA (K-DUR,KLOR-CON) 20 MEQ tablet Take 1 tablet (20 mEq total) by mouth daily. 05/16/17   Charlott Rakes, MD  pregabalin (LYRICA) 100 MG capsule Take 1 capsule (100 mg total) by mouth 2 (two) times daily. Patient taking differently: Take 100 mg by mouth 2 (two) times daily.  06/15/17   Charlott Rakes, MD  spironolactone (ALDACTONE) 50 MG tablet Take 1 tablet (50 mg total) by mouth  daily. 06/10/17   Guadalupe Dawn, MD  sucralfate (CARAFATE) 1 g tablet Take 1 tablet (1 g total) by mouth 4 (four) times daily -  with meals and at bedtime for 10 days. Patient not taking: Reported on 07/20/2017 05/14/17 07/20/25  Duffy Bruce, MD  ticagrelor (BRILINTA) 90 MG TABS tablet Take 1 tablet (90 mg total) by mouth 2 (two) times daily. 05/16/17   Charlott Rakes, MD  TRUEPLUS LANCETS 28G MISC 1 each by Does not apply route 3 (three) times daily before meals. 05/16/17   Charlott Rakes, MD    Family History Family History  Problem Relation Age of Onset  . Coronary artery disease Mother   . Coronary artery disease Father     Social History Social History   Tobacco Use  . Smoking status: Never Smoker  . Smokeless tobacco: Never Used  Substance Use Topics  . Alcohol use: Never    Frequency: Never  . Drug use: Never     Allergies   Benadryl [diphenhydramine] and Penicillins   Review of Systems Review of Systems  Cardiovascular: Positive for chest pain.  All other  systems reviewed and are negative.    Physical Exam Updated Vital Signs BP (!) 150/93   Pulse 100   Temp 98 F (36.7 C) (Oral)   Resp (!) 23   Ht 5\' 8"  (1.727 m)   Wt 104.3 kg (230 lb)   SpO2 94%   BMI 34.97 kg/m   Physical Exam  Constitutional: He is oriented to person, place, and time. He appears well-developed and well-nourished.  HENT:  Head: Normocephalic and atraumatic.  Mouth/Throat: Oropharynx is clear and moist.  Eyes: Pupils are equal, round, and reactive to light. Conjunctivae and EOM are normal.  Neck: Normal range of motion.  Cardiovascular: Normal rate, regular rhythm and normal heart sounds.  Pulmonary/Chest: Effort normal and breath sounds normal. No accessory muscle usage. He has no decreased breath sounds. He has no wheezes.  Abdominal: Soft. Bowel sounds are normal.  Musculoskeletal: Normal range of motion.  Neurological: He is alert and oriented to person, place, and time.  Skin: Skin is warm and dry.  Psychiatric: He has a normal mood and affect.  Nursing note and vitals reviewed.    ED Treatments / Results  Labs (all labs ordered are listed, but only abnormal results are displayed) Labs Reviewed  BASIC METABOLIC PANEL - Abnormal; Notable for the following components:      Result Value   Glucose, Bld 149 (*)    Creatinine, Ser 1.43 (*)    Calcium 8.7 (*)    GFR calc non Af Amer 58 (*)    All other components within normal limits  CBC - Abnormal; Notable for the following components:   WBC 12.6 (*)    RBC 3.82 (*)    Hemoglobin 10.9 (*)    HCT 33.8 (*)    All other components within normal limits  I-STAT TROPONIN, ED  I-STAT TROPONIN, ED    EKG EKG Interpretation  Date/Time:  Tuesday July 25 2017 22:52:01 EDT Ventricular Rate:  102 PR Interval:    QRS Duration: 132 QT Interval:  403 QTC Calculation: 525 R Axis:   -47 Text Interpretation:  Sinus tachycardia Probable left atrial enlargement Right bundle branch block No significant  change since last tracing Confirmed by Thayer Jew [phone]) on 07/26/2017 1:15:16 AM   Radiology Dg Chest 2 View  Result Date: 07/25/2017 CLINICAL DATA:  Left-sided chest pain with dyspnea this afternoon. EXAM:  CHEST - 2 VIEW COMPARISON:  07/20/2017 FINDINGS: Stable cardiomegaly. No pulmonary consolidation, effusion or pneumothorax. Minimal atelectasis at the lung bases. No acute osseous finding. IMPRESSION: Stable cardiomegaly.  Bibasilar atelectasis. Electronically Signed   By: Ashley Royalty M.D.   On: 07/25/2017 23:47    Procedures Procedures (including critical care time)  Medications Ordered in ED Medications - No data to display   Initial Impression / Assessment and Plan / ED Course  I have reviewed the triage vital signs and the nursing notes.  Pertinent labs & imaging results that were available during my care of the patient were reviewed by me and considered in my medical decision making (see chart for details).  47 year old male here with chest pain.  Has extensive cardiac history, followed by Dr. Einar Gip.  Admitted earlier in the month with elevated troponins that remained flat, this was felt to be noncardiac.  Had cardiac catheterization in May 2019 with diffuse disease but no occlusive lesions or restenosis of stents.  He is being managed medically.  Reports chest pain since this afternoon around 1 PM.  He is afebrile and nontoxic.  EKG is nonischemic.  Initial labs reassuring.  Chest x-ray is clear.  Patient with cardiac risk factors, therefore delta troponin obtained which remains negative.  At this point given negative evaluation, low suspicion for ACS, PE, dissection, acute cardiac event.  Feel patient can be discharged to follow-up with his primary care doctor and/or cardiologist.  He will return here for any new or acute changes.  Final Clinical Impressions(s) / ED Diagnoses   Final diagnoses:  Chest pain in adult    ED Discharge Orders    None       Larene Pickett, PA-C 07/26/17 [id number]    Merryl Hacker, MD 07/26/17 [phone]

## 2017-07-26 LAB — I-STAT TROPONIN, ED: Troponin i, poc: 0 ng/mL (ref 0.00–0.08)

## 2017-07-26 NOTE — Discharge Instructions (Signed)
Your cardiac testing today was all normal. We recommend you to follow-up with your primary care doctor and/or your cardiologist. Return here for any new/acute changes.

## 2017-07-26 NOTE — ED Notes (Signed)
PT states understanding of care given, follow up care. PT ambulated from ED to car with a steady gait.  

## 2017-07-31 ENCOUNTER — Encounter (HOSPITAL_COMMUNITY): Payer: Self-pay

## 2017-07-31 ENCOUNTER — Emergency Department (HOSPITAL_COMMUNITY): Payer: Self-pay

## 2017-07-31 ENCOUNTER — Emergency Department (HOSPITAL_COMMUNITY)
Admission: EM | Admit: 2017-07-31 | Discharge: 2017-08-01 | Disposition: A | Discharge status: Home / Self Care | Payer: Self-pay | Attending: Emergency Medicine | Admitting: Emergency Medicine

## 2017-07-31 DIAGNOSIS — R600 Localized edema: Secondary | ICD-10-CM | POA: Insufficient documentation

## 2017-07-31 DIAGNOSIS — I10 Essential (primary) hypertension: Secondary | ICD-10-CM

## 2017-07-31 DIAGNOSIS — E1122 Type 2 diabetes mellitus with diabetic chronic kidney disease: Secondary | ICD-10-CM | POA: Insufficient documentation

## 2017-07-31 DIAGNOSIS — R000 Tachycardia, unspecified: Secondary | ICD-10-CM | POA: Insufficient documentation

## 2017-07-31 DIAGNOSIS — R072 Precordial pain: Secondary | ICD-10-CM | POA: Insufficient documentation

## 2017-07-31 DIAGNOSIS — Z794 Long term (current) use of insulin: Secondary | ICD-10-CM | POA: Insufficient documentation

## 2017-07-31 DIAGNOSIS — R0602 Shortness of breath: Secondary | ICD-10-CM | POA: Insufficient documentation

## 2017-07-31 DIAGNOSIS — D473 Essential (hemorrhagic) thrombocythemia: Secondary | ICD-10-CM | POA: Insufficient documentation

## 2017-07-31 DIAGNOSIS — D75839 Thrombocytosis, unspecified: Secondary | ICD-10-CM

## 2017-07-31 DIAGNOSIS — D649 Anemia, unspecified: Secondary | ICD-10-CM | POA: Insufficient documentation

## 2017-07-31 DIAGNOSIS — E1140 Type 2 diabetes mellitus with diabetic neuropathy, unspecified: Secondary | ICD-10-CM | POA: Insufficient documentation

## 2017-07-31 DIAGNOSIS — N183 Chronic kidney disease, stage 3 (moderate): Secondary | ICD-10-CM | POA: Insufficient documentation

## 2017-07-31 DIAGNOSIS — R42 Dizziness and giddiness: Secondary | ICD-10-CM | POA: Insufficient documentation

## 2017-07-31 DIAGNOSIS — R0789 Other chest pain: Secondary | ICD-10-CM

## 2017-07-31 DIAGNOSIS — I129 Hypertensive chronic kidney disease with stage 1 through stage 4 chronic kidney disease, or unspecified chronic kidney disease: Secondary | ICD-10-CM | POA: Insufficient documentation

## 2017-07-31 DIAGNOSIS — R110 Nausea: Secondary | ICD-10-CM | POA: Insufficient documentation

## 2017-07-31 LAB — I-STAT TROPONIN, ED: Troponin i, poc: 0.02 ng/mL (ref 0.00–0.08)

## 2017-07-31 LAB — BRAIN NATRIURETIC PEPTIDE: B NATRIURETIC PEPTIDE 5: 53.9 pg/mL (ref 0.0–100.0)

## 2017-07-31 LAB — BASIC METABOLIC PANEL
ANION GAP: 10 (ref 5–15)
BUN: 17 mg/dL (ref 6–20)
CO2: 23 mmol/L (ref 22–32)
Calcium: 9.1 mg/dL (ref 8.9–10.3)
Chloride: 106 mmol/L (ref 98–111)
Creatinine, Ser: 1.38 mg/dL — ABNORMAL HIGH (ref 0.61–1.24)
GFR calc non Af Amer: >60 mL/min (ref >60)
Glucose, Bld: 181 mg/dL — ABNORMAL HIGH (ref 70–99)
POTASSIUM: 4 mmol/L (ref 3.5–5.1)
SODIUM: 139 mmol/L (ref 135–145)

## 2017-07-31 LAB — HEPATIC FUNCTION PANEL
ALBUMIN: 3.5 g/dL (ref 3.5–5.0)
ALT: 12 U/L (ref 0–44)
AST: 18 U/L (ref 15–41)
Alkaline Phosphatase: 90 U/L (ref 38–126)
Bilirubin, Direct: <0.1 mg/dL (ref 0.0–0.2)
TOTAL PROTEIN: 7.1 g/dL (ref 6.5–8.1)
Total Bilirubin: 0.3 mg/dL (ref 0.3–1.2)

## 2017-07-31 LAB — CBC
HEMATOCRIT: 37.6 % — AB (ref 39.0–52.0)
HEMOGLOBIN: 12 g/dL — AB (ref 13.0–17.0)
MCH: 28.4 pg (ref 26.0–34.0)
MCHC: 31.9 g/dL (ref 30.0–36.0)
MCV: 88.9 fL (ref 78.0–100.0)
Platelets: 427 10*3/uL — ABNORMAL HIGH (ref 150–400)
RBC: 4.23 MIL/uL (ref 4.22–5.81)
RDW: 14.3 % (ref 11.5–15.5)
WBC: 11.5 10*3/uL — AB (ref 4.0–10.5)

## 2017-07-31 MED ORDER — FAMOTIDINE IN NACL 20-0.9 MG/50ML-% IV SOLN
20.0000 mg | Freq: Once | INTRAVENOUS | Status: AC
  Administered 2017-07-31: 20 mg via INTRAVENOUS
  Filled 2017-07-31: qty 50

## 2017-07-31 MED ORDER — ACETAMINOPHEN 325 MG PO TABS
650.0000 mg | ORAL_TABLET | Freq: Once | ORAL | Status: AC
  Administered 2017-07-31: 650 mg via ORAL
  Filled 2017-07-31: qty 2

## 2017-07-31 MED ORDER — GI COCKTAIL ~~LOC~~
30.0000 mL | Freq: Once | ORAL | Status: AC
  Administered 2017-07-31: 30 mL via ORAL
  Filled 2017-07-31: qty 30

## 2017-07-31 MED ORDER — ONDANSETRON HCL 4 MG/2ML IJ SOLN
4.0000 mg | Freq: Once | INTRAMUSCULAR | Status: AC
  Administered 2017-07-31: 4 mg via INTRAVENOUS
  Filled 2017-07-31: qty 2

## 2017-07-31 NOTE — ED Provider Notes (Signed)
[facility] Provider Note   CSN: [id number] Arrival date & time: 07/31/17  2013     History   Chief Complaint Chief Complaint  Patient presents with  . Chest Pain    HPI Aaron Pearson is a 47 y.o. male with a PMHx of anxiety, dCHF (EF 50-55% on 06/08/17), CAD/MI s/p multiple stents (but most recent cath in 05/2017 without evidence of severe restenosis or stent thrombosis), DM2, GERD, HTN, CKD3, homelessness, and other conditions listed below, who presents to the ED with complaints of L sided CP that began at noon while he was at rest.  He states this is somewhat similar to prior chest pain episodes.  He describes the pain as 10/10 constant sharp left-sided chest pain that radiates into the left arm, worsens with movement or activity, unchanged with inspiration, and unrelieved with a total of 4 nitroglycerin as well as 325 mg aspirin.  He reports associated mild lightheadedness "all day" as well as "a little bit" of nausea.  He also mentions that he has some shortness of breath with activity ever since the chest pain started, but does not feel short of breath at rest.  His cardiologist is Dr. Einar Gip although he has not seen him recently due to financial/insurance issues.  Of note, pt has been seen multiple times for similar complaints, including being admitted 07/14/17 for elevated troponins (which remained flat, cardiology felt his pain was musculoskeletal), then again on 07/20/17 and 07/26/17 when he had reassuring work ups both times.  He has a care plan, as outlined below.  He has had 12 ED visits in the last 6 months, most of them being since 04/2017, and most of those being for CP complaints.  He is a nonsmoker.  He reports compliance with all of his home prescribed medications.  He has a +FHx of MI in his mother (died at 56 of ?MI) and his father (sometime in his 25s, survived the MI).    He denies diaphoresis, fevers, chills, cough, worsening/new LE swelling,  recent travel/surgery/immobilization, personal hx of DVT/PE, abd pain, V/D/C, hematuria, dysuria, myalgias, arthralgias, claudication, orthopnea, numbness, tingling, focal weakness, or any other complaints at this time.   CARE PLAN: Background:   -Patient with extensive history of coronary artery disease. -Patient had multiple ED visits for chest pain. -Most recent cardiac cath in May 2019 revealed No stent thrombosis seen -No severe restenosis seen, medical management advised  Plan: -Per cardiology recommendations, if the patient presents for chest pain, treat with aspirin and nitroglycerin.  Please avoid narcotics. -EKG and cardiac markers are warranted, then consult cardiology. -Cardiologist is  listed below -Nigel Mormon, MD, [facility], Pager: [phone], Office: [phone] -If no answer Cell [phone] -Or Dr Adrian Prows  The history is provided by the patient and medical records. No language interpreter was used.    Past Medical History:  Diagnosis Date  . Anxiety   . 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 Coronary artery disease   . Diabetes mellitus without complication ([place])   . GERD (gastroesophageal reflux disease)   . Headache   . Hx of diabetic neuropathy   . Hypertension   . Myocardial infarction ([place]) 2018  . Renal disorder     Patient Active Problem List   Diagnosis Date Noted  . Chest pain 07/14/2017  . Atypical chest pain 06/07/2017  . History of endocarditis   . Ischemic cardiomyopathy 06/06/2017  . Diabetic neuropathy ([place]) 05/29/2017  .  Hypertension 05/16/2017  . Coronary artery disease 05/16/2017  . Anxiety and depression 05/16/2017  . Chronic pain 05/16/2017  . GERD (gastroesophageal reflux disease) 05/16/2017  . Type 2 diabetes mellitus ([place]) 05/16/2017  . Unstable angina ([place]) 05/05/2017  . Precordial chest pain 05/04/2017    Past Surgical History:  Procedure Laterality Date  . CARDIAC CATHETERIZATION  05/05/2017  .  CARPAL TUNNEL RELEASE    . CORONARY STENT INTERVENTION N/A 05/05/2017   Procedure: CORONARY STENT INTERVENTION;  Surgeon: Nigel Mormon, MD;  Location: [facility];  Service: Cardiovascular;  Laterality: N/A;  . CORONARY STENT PLACEMENT    . INTRAVASCULAR PRESSURE WIRE/FFR STUDY N/A 05/05/2017   Procedure: INTRAVASCULAR PRESSURE WIRE/FFR STUDY;  Surgeon: Nigel Mormon, MD;  Location: [facility];  Service: Cardiovascular;  Laterality: N/A;  . INTRAVASCULAR PRESSURE WIRE/FFR STUDY N/A 05/10/2017   Procedure: INTRAVASCULAR PRESSURE WIRE/FFR STUDY;  Surgeon: Nigel Mormon, MD;  Location: [facility];  Service: Cardiovascular;  Laterality: N/A;  . LEFT HEART CATH AND CORONARY ANGIOGRAPHY N/A 05/05/2017   Procedure: LEFT HEART CATH AND CORONARY ANGIOGRAPHY;  Surgeon: Nigel Mormon, MD;  Location: [facility];  Service: Cardiovascular;  Laterality: N/A;  . LEFT HEART CATH AND CORONARY ANGIOGRAPHY N/A 05/10/2017   Procedure: LEFT HEART CATH AND CORONARY ANGIOGRAPHY;  Surgeon: Nigel Mormon, MD;  Location: [facility];  Service: Cardiovascular;  Laterality: N/A;        Home Medications    Prior to Admission medications   Medication Sig Start Date End Date Taking? Authorizing Provider  acetaminophen (TYLENOL) 325 MG tablet Take 2 tablets (650 mg total) by mouth every 6 (six) hours as needed for moderate pain or headache. 05/06/17   Patwardhan, Manish J, MD  amLODipine (NORVASC) 10 MG tablet Take 10 mg by mouth daily. 06/14/17   [provider]  aspirin EC 81 MG tablet Take 81 mg by mouth daily. 04/06/17   [provider]  atorvastatin (LIPITOR) 80 MG tablet Take 1 tablet (80 mg total) by mouth daily at 6 PM. 05/16/17 05/16/18  Charlott Rakes, MD  Blood Glucose Monitoring Suppl (TRUE METRIX METER) DEVI 1 each by Does not apply route 3 (three) times daily before meals. 05/16/17   Charlott Rakes, MD  diclofenac sodium (VOLTAREN) 1  % GEL Apply 4 g topically 4 (four) times daily. 05/16/17   Charlott Rakes, MD  DULoxetine (CYMBALTA) 60 MG capsule Take 1 capsule (60 mg total) by mouth daily. 05/16/17   Charlott Rakes, MD  furosemide (LASIX) 20 MG tablet Take 1 tablet (20 mg total) by mouth daily. 06/19/17   Charlott Rakes, MD  gabapentin (NEURONTIN) 300 MG capsule Take 1 capsule (300 mg total) by mouth 3 (three) times daily. 07/24/17   Charlott Rakes, MD  glucose blood (TRUE METRIX BLOOD GLUCOSE TEST) test strip 3 times daily before meals 05/16/17   Newlin, Enobong, MD  insulin aspart (NOVOLOG FLEXPEN) 100 UNIT/ML FlexPen Inject 10 Units into the skin 3 (three) times daily with meals. 05/16/17   Charlott Rakes, MD  Insulin Glargine (LANTUS SOLOSTAR) 100 UNIT/ML Solostar Pen Inject 42 Units into the skin daily at 10 pm. 06/06/17   Charlott Rakes, MD  isosorbide mononitrate (IMDUR) 30 MG 24 hr tablet Take 1 tablet (30 mg total) by mouth daily. 06/17/17   [place] Bing, DO  lidocaine (LIDODERM) 5 % Place 1 patch onto the skin daily. Remove & Discard patch within 12 hours or  as directed by MD 07/24/17   Charlott Rakes, MD  lisinopril (PRINIVIL,ZESTRIL) 40 MG tablet Take 1 tablet (40 mg total) by mouth every morning. Patient taking differently: Take 40 mg by mouth daily.  05/16/17   Charlott Rakes, MD  methocarbamol (ROBAXIN) 500 MG tablet Take 1 tablet (500 mg total) by mouth every 8 (eight) hours as needed for muscle spasms. 06/06/17   Charlott Rakes, MD  metoprolol tartrate (LOPRESSOR) 100 MG tablet Take 1 tablet (100 mg total) by mouth 2 (two) times daily. 05/16/17 07/20/24  Charlott Rakes, MD  nitroGLYCERIN (NITROSTAT) 0.4 MG SL tablet Place 1 tablet (0.4 mg total) under the tongue See admin instructions. Place 1 tablet (0.4mg ) under tongue every 5 minutes as needed for chest pain. Call doctor/ 911 if take 2 doses. Max 3/Day 06/19/17   Charlott Rakes, MD  omeprazole (PRILOSEC) 20 MG capsule Take 20 mg by mouth daily.    [provider]  potassium chloride SA (K-DUR,KLOR-CON) 20 MEQ tablet Take 1 tablet (20 mEq total) by mouth daily. 05/16/17   Charlott Rakes, MD  pregabalin (LYRICA) 100 MG capsule Take 1 capsule (100 mg total) by mouth 2 (two) times daily. Patient taking differently: Take 100 mg by mouth 2 (two) times daily.  06/15/17   Charlott Rakes, MD  spironolactone (ALDACTONE) 50 MG tablet Take 1 tablet (50 mg total) by mouth daily. 06/10/17   Guadalupe Dawn, MD  sucralfate (CARAFATE) 1 g tablet Take 1 tablet (1 g total) by mouth 4 (four) times daily -  with meals and at bedtime for 10 days. Patient not taking: Reported on 07/20/2017 05/14/17 07/20/25  Duffy Bruce, MD  ticagrelor (BRILINTA) 90 MG TABS tablet Take 1 tablet (90 mg total) by mouth 2 (two) times daily. 05/16/17   Charlott Rakes, MD  TRUEPLUS LANCETS 28G MISC 1 each by Does not apply route 3 (three) times daily before meals. 05/16/17   Charlott Rakes, MD    Family History Family History  Problem Relation Age of Onset  . Coronary artery disease Mother   . Coronary artery disease Father     Social History Social History   Tobacco Use  . Smoking status: Never Smoker  . Smokeless tobacco: Never Used  Substance Use Topics  . Alcohol use: Never    Frequency: Never  . Drug use: Never     Allergies   Benadryl [diphenhydramine] and Penicillins   Review of Systems Review of Systems  Constitutional: Negative for chills, diaphoresis and fever.  Respiratory: Positive for shortness of breath (with activity, none at rest ). Negative for cough.   Cardiovascular: Positive for chest pain. Negative for leg swelling (none worse than baseline).  Gastrointestinal: Positive for nausea ("a little bit"). Negative for abdominal pain, constipation, diarrhea and vomiting.  Genitourinary: Negative for dysuria and hematuria.  Musculoskeletal: Negative for arthralgias and myalgias.  Skin: Negative for color change.  Allergic/Immunologic: Positive for  immunocompromised state (DM2).  Neurological: Positive for light-headedness (mild). Negative for weakness and numbness.  Psychiatric/Behavioral: Negative for confusion.   All other systems reviewed and are negative for acute change except as noted in the HPI.    Physical Exam Updated Vital Signs BP (!) 170/116 (BP Location: Right Arm)   Pulse (!) 108   Temp 98.6 F (37 C) (Oral)   Resp (!) 22   Ht 5\' 8"  (1.727 m)   Wt 99.8 kg (220 lb)   SpO2 96%   BMI 33.45 kg/m  Recheck VS 10:04 PM: BP Marland Kitchen)  185/99   Pulse 91   Temp 98.6 F (37 C) (Oral)   Resp 16   Ht 5\' 8"  (1.727 m)   Wt 99.8 kg (220 lb)   SpO2 99%   BMI 33.45 kg/m     Physical Exam  Constitutional: He is oriented to person, place, and time. Vital signs are normal. He appears well-developed and well-nourished.  Non-toxic appearance. No distress.  Afebrile, nontoxic, NAD, HTN noted similar to prior visits. HR 98-101 during evaluation, also similar to prior visits.   HENT:  Head: Normocephalic and atraumatic.  Mouth/Throat: Oropharynx is clear and moist and mucous membranes are normal.  Eyes: Conjunctivae and EOM are normal. Right eye exhibits no discharge. Left eye exhibits no discharge.  Neck: Normal range of motion. Neck supple.  Cardiovascular: Regular rhythm, normal heart sounds and intact distal pulses. Tachycardia present. Exam reveals no gallop and no friction rub.  No murmur heard. HR 98-101 during evaluation, similar to prior visits. Reg rhythm, nl s1/s2, no m/r/g, distal pulses intact, 1+ b/l pedal edema   Pulmonary/Chest: Effort normal and breath sounds normal. No respiratory distress. He has no decreased breath sounds. He has no wheezes. He has no rhonchi. He has no rales. He exhibits tenderness. He exhibits no crepitus, no deformity and no retraction.  CTAB in all lung fields, no w/r/r, no hypoxia or increased WOB, speaking in full sentences, SpO2 98% on RA during my evaluation, no tachypnea during exam.  Chest  wall with diffuse L anterior chest wall TTP extending to the epigastric area, without crepitus, deformities, or retractions     Abdominal: Soft. Normal appearance and bowel sounds are normal. He exhibits no distension. There is tenderness in the epigastric area. There is no rigidity, no rebound, no guarding, no CVA tenderness, no tenderness at McBurney's point and negative Murphy's sign.  Soft, nondistended, +BS throughout, with mild epigastric TTP, no r/g/r, neg murphy's, neg mcburney's, no CVA TTP   Musculoskeletal: Normal range of motion.  MAE x4 Strength and sensation grossly intact in all extremities Distal pulses intact 1+ b/l pedal edema, neg homan's bilaterally   Neurological: He is alert and oriented to person, place, and time. He has normal strength. No sensory deficit.  Skin: Skin is warm, dry and intact. No rash noted.  Psychiatric: He has a normal mood and affect.  Nursing note and vitals reviewed.    ED Treatments / Results  Labs (all labs ordered are listed, but only abnormal results are displayed) Labs Reviewed  BASIC METABOLIC PANEL - Abnormal; Notable for the following components:      Result Value   Glucose, Bld 181 (*)    Creatinine, Ser 1.38 (*)    All other components within normal limits  CBC - Abnormal; Notable for the following components:   WBC 11.5 (*)    Hemoglobin 12.0 (*)    HCT 37.6 (*)    Platelets 427 (*)    All other components within normal limits  HEPATIC FUNCTION PANEL  BRAIN NATRIURETIC PEPTIDE  I-STAT TROPONIN, ED    EKG EKG Interpretation  Date/Time:  Monday July 31 2017 20:27:37 EDT Ventricular Rate:  101 PR Interval:    QRS Duration: 131 QT Interval:  391 QTC Calculation: 507 R Axis:   -48 Text Interpretation:  Sinus tachycardia Probable left atrial enlargement Right bundle branch block No significant change since last tracing Confirmed by Isla Pence [phone]) on 07/31/2017 8:34:43 PM   Radiology Dg Chest 2 View  Result  Date:  07/31/2017 CLINICAL DATA:  Pt c/o of cp, sob, weakness, dizziness and cough that started today. PT has hx of heart attack and 9 stents. EXAM: CHEST - 2 VIEW COMPARISON:  07/25/2017 FINDINGS: Lungs are clear. Heart size upper limits normal. Mediastinal contours are within normal limits. No effusion.  No pneumothorax. Visualized bones unremarkable. IMPRESSION: No acute cardiopulmonary disease. Electronically Signed   By: Lucrezia Europe M.D.   On: 07/31/2017 20:43     LHC 05/10/17: Conclusion LM: Normal LAD: Prior prox-distal LAD stents with 20% prox LAD restenosis. Diagonal with moderate disease LCx: Patent OM2 stent with % restenosis        40% restensosis in small distal LCx OM2 90% stenosis. Successful PCI w/Resolute DES 2.75 X 18 mm RCA: Patent Distal RCA stent with 0% restenosis         RPL with FFR negative (0.91) moderate disease         Small RPDA with severe diffuse disease, best treated medically   Elevated LVEDP 30 mmHg  No stent thrombosis seen No severe restenosis seen  Recommendation: Continue DAPT for at least 1 year Aggressive management of risk factors including type 2 DM. Recommend diuresis.  Nigel Mormon, MD [facility] Pager: [phone] Office: [phone] If no answer Cell [phone]    06/08/17 Echo: Study Conclusions - Left ventricle: The cavity size was normal. There was mild   concentric hypertrophy. The estimated ejection fraction was in   the range of 50% to 55%. Wall motion was normal; there were no   regional wall motion abnormalities. Doppler parameters are   consistent with a reversible restrictive pattern, indicative of   decreased left ventricular diastolic compliance and/or increased   left atrial pressure (grade 3 diastolic dysfunction). - Left atrium: The atrium was severely dilated. - No significant valvular abnormality. RA pressure estimate 8 mmHg. - No significant change compared to previous study dated    05/05/2017. Impressions: - Heart failure with preserved ejection fraction. Grade III   diastolic dysfunction, elevated left atrial pressure   No evidence of pericarditis.   Procedures Procedures (including critical care time)  Medications Ordered in ED Medications  acetaminophen (TYLENOL) tablet 650 mg (has no administration in time range)  gi cocktail (Maalox,Lidocaine,Donnatal) (30 mLs Oral Given 07/31/17 2107)  ondansetron (ZOFRAN) injection 4 mg (4 mg Intravenous Given 07/31/17 2107)  famotidine (PEPCID) IVPB 20 mg premix (0 mg Intravenous Stopped 07/31/17 2148)     Initial Impression / Assessment and Plan / ED Course  I have reviewed the triage vital signs and the nursing notes.  Pertinent labs & imaging results that were available during my care of the patient were reviewed by me and considered in my medical decision making (see chart for details).     8:45 PM: attempted to see pt at 8:30pm shortly after he arrived and he was in radiology getting his CXR. He has not yet returned, which is why there is a delay in evaluation.   8:50 PM: pt returned to room, able to be evaluated  47 y.o. male here with L sided CP radiating down L arm starting at 12pm. Reported feeling "a little bit" nauseated, slightly lightheaded, and has SOB with activity but not at rest. He's been seen multiple times in the past, this being his 4th visit this month, for similar symptoms, he's been admitted this month for elevated troponins (which remained flat, cardiology felt his CP was musculoskeletal); since that admission he's had two ED visits with reassuring  work ups. On exam, clear lung exam, no hypoxia, no tachypnea during exam, L chest wall TTP, mild epigastric TTP but nonperitoneal and neg murphy's sign, 1+ b/l pedal edema, neg homan's bilaterally, pt marginally tachycardic, HR 98-101 during exam, review of prior visits shows that he has intermittent tachycardic readings; BP also elevated similar to prior  visits. He had an elevated D-dimer during an ED visit on 05/17/17 but CTA chest was negative for PE. Doubt PE/dissection, doubt need for d-dimer again today, his tachycardia could be from receiving NTG just prior to arrival, or could just be his baseline since it's been similar in prior visits. No pleuritic CP complaint.  Work up thus far today shows: EKG without acute ischemic findings, unchanged from prior. CXR negative. Trop negative at 8.5hr mark. CBC with slight hemoconcentration (Hgb 12.0, up from usual baseline of ~10.5-10.9) with marginally elevated WBC 11.5 likely from hemoconcentration, also with stable thrombocytosis Plt 427. BMP pending. I suspect there is a component of GERD vs MsK etiology. Doubt dissection/PE. Will add-on BNP and LFTs, await BMP, give GI cocktail/pepcid/zofran, and reassess shortly.  Of note, Pt immediately requesting pain medication, asked nursing staff for morphine and fentanyl; per care plan will avoid narcotics; I suspect there may be a drug seeking component to his repeated visits. He's already received ASA and NTG so no need to repeat these. Want to avoid nephrotoxins since he's got CKD3 and I don't have his kidney function back today. Will reassess shortly. Doubt need for cardiology consult at this time. Discussed case with my attending Dr. Gilford Raid who agrees with plan.   9:56 PM BMP with stable kidney function Cr 1.38, mildly elevated gluc 181, otherwise WNL. BNP WNL. LFTs WNL. Pt states no improvement in pain, however his HR is coming down to the lower-90s and he appears very comfortable, objectively doesn't appear to be in much discomfort at all. Will give tylenol, and continue avoiding narcotics. Given his cardiac RFs, will get second trop at 3hr mark from first one (which would put him at ~11.5hrs from onset of symptoms). Will continue to monitor and reassess shortly.   10:13 PM Second trop scheduled for 11:30 pm. Patient care to be resumed by Quincy Carnes, PA-C at  shift change sign-out. Patient history has been discussed with midlevel resuming care. Plan is to await repeat troponin, if negative then he can be discharged home with outpatient f/up, doubt need for cardiology consult if the two troponins are negative; if positive then consult cardiology. Please see their notes for further documentation of pending results and dispo/care. Pt stable at sign-out and updated on transfer of care.     Final Clinical Impressions(s) / ED Diagnoses   Final diagnoses:  Precordial chest pain  Essential hypertension  Chest wall pain  Nausea  Chronic anemia  Thrombocytosis [facility])    ED Discharge Orders    [address] [place], [place] 07/31/17 2215    Isla Pence, MD 08/01/17 1620

## 2017-07-31 NOTE — ED Triage Notes (Signed)
Patient BIB [facility] for chest pain since this morning. Patient describes pain (10/10) as sharpe which radiates down LEFT arm. Patient took 2 NTG and 324 mg aspirin before EMS arrived without relief. EMS gave 2 NTG without relief as well. Hx HTN, 9 stents placed and MI Dec. 2018. Patient alert and oriented. Patient also c/o SOB and nausea. Denies vomiting, abdominal pain, and fever.   VS with EMS 176/116, HR 115, RR 20, 94% RA, and 174 CBG.

## 2017-08-01 LAB — I-STAT TROPONIN, ED: Troponin i, poc: 0.04 ng/mL (ref 0.00–0.08)

## 2017-08-01 NOTE — ED Notes (Signed)
Patient reports 9/10 chest pain still. Requesting fentanyl and morphine, states "those are the only medications that seem to work when [employer] here".

## 2017-08-01 NOTE — Discharge Instructions (Signed)
All your cardiac tests today were normal. We recommend you follow-up with your primary care doctor. Return here for any new/acute changes.

## 2017-08-01 NOTE — ED Notes (Signed)
Patient verbalizes understanding of discharge and follow up instructions. No further questions at this time. VSS and ambulatory at discharge.

## 2017-08-01 NOTE — ED Provider Notes (Signed)
Assumed care from [facility] at shift change.  See prior notes for full H&P.  Briefly, 47 y.o. M here with chest pain that began around noon today.  Has extensive cardiac history, follows with Dr. Nadyne Coombes.  Also has active care plan due to frequent ER visits for chest pain and requesting narcotics.  Work-up today thus far reassuring.    Plan: delta trop pending.  If negative, d/c home with OP cardiology follow-up.  Results for orders placed or performed during the hospital encounter of 99/24/26  Basic metabolic panel  Result Value Ref Range   Sodium 139 135 - 145 mmol/L   Potassium 4.0 3.5 - 5.1 mmol/L   Chloride 106 98 - 111 mmol/L   CO2 23 22 - 32 mmol/L   Glucose, Bld 181 (H) 70 - 99 mg/dL   BUN 17 6 - 20 mg/dL   Creatinine, Ser 1.38 (H) 0.61 - 1.24 mg/dL   Calcium 9.1 8.9 - 10.3 mg/dL   GFR calc non Af Amer >60 >60 mL/min   GFR calc Af Amer >60 >60 mL/min   Anion gap 10 5 - 15  CBC  Result Value Ref Range   WBC 11.5 (H) 4.0 - 10.5 K/uL   RBC 4.23 4.22 - 5.81 MIL/uL   Hemoglobin 12.0 (L) 13.0 - 17.0 g/dL   HCT 37.6 (L) 39.0 - 52.0 %   MCV 88.9 78.0 - 100.0 fL   MCH 28.4 26.0 - 34.0 pg   MCHC 31.9 30.0 - 36.0 g/dL   RDW 14.3 11.5 - 15.5 %   Platelets 427 (H) 150 - 400 K/uL  Hepatic function panel  Result Value Ref Range   Total Protein 7.1 6.5 - 8.1 g/dL   Albumin 3.5 3.5 - 5.0 g/dL   AST 18 15 - 41 U/L   ALT 12 0 - 44 U/L   Alkaline Phosphatase 90 38 - 126 U/L   Total Bilirubin 0.3 0.3 - 1.2 mg/dL   Bilirubin, Direct <0.1 0.0 - 0.2 mg/dL   Indirect Bilirubin NOT CALCULATED 0.3 - 0.9 mg/dL  Brain natriuretic peptide  Result Value Ref Range   B Natriuretic Peptide 53.9 0.0 - 100.0 pg/mL  I-stat troponin, ED  Result Value Ref Range   Troponin i, poc 0.02 0.00 - 0.08 ng/mL   Comment 3          I-stat troponin, ED  Result Value Ref Range   Troponin i, poc 0.04 0.00 - 0.08 ng/mL   Comment 3           Dg Chest 2 View  Result Date: 07/31/2017 CLINICAL DATA:  Pt c/o of cp,  sob, weakness, dizziness and cough that started today. PT has hx of heart attack and 9 stents. EXAM: CHEST - 2 VIEW COMPARISON:  07/25/2017 FINDINGS: Lungs are clear. Heart size upper limits normal. Mediastinal contours are within normal limits. No effusion.  No pneumothorax. Visualized bones unremarkable. IMPRESSION: No acute cardiopulmonary disease. Electronically Signed   By: Lucrezia Europe M.D.   On: 07/31/2017 20:43   Dg Chest 2 View  Result Date: 07/25/2017 CLINICAL DATA:  Left-sided chest pain with dyspnea this afternoon. EXAM: CHEST - 2 VIEW COMPARISON:  07/20/2017 FINDINGS: Stable cardiomegaly. No pulmonary consolidation, effusion or pneumothorax. Minimal atelectasis at the lung bases. No acute osseous finding. IMPRESSION: Stable cardiomegaly.  Bibasilar atelectasis. Electronically Signed   By: Ashley Royalty M.D.   On: 07/25/2017 23:47   Dg Chest 2 View  Result Date: 07/20/2017 CLINICAL  DATA:  Chest pain EXAM: CHEST - 2 VIEW COMPARISON:  07/14/2017 FINDINGS: Mild prominence of basal lung markings that is stable. There is no edema, consolidation, effusion, or pneumothorax. Normal heart size and mediastinal contours. No acute osseous finding. IMPRESSION: No acute finding or change from prior. Electronically Signed   By: Monte Fantasia M.D.   On: 07/20/2017 00:53   Dg Chest 2 View  Result Date: 07/14/2017 CLINICAL DATA:  47 year old male with acute chest pain EXAM: CHEST - 2 VIEW COMPARISON:  06/30/2017 and prior radiographs FINDINGS: Mild cardiomegaly, mild chronic peribronchial thickening minimal LEFT basilar scarring again noted. There is no evidence of focal airspace disease, pulmonary edema, suspicious pulmonary nodule/mass, pleural effusion, or pneumothorax. No acute bony abnormalities are identified. IMPRESSION: Mild cardiomegaly without evidence of acute cardiopulmonary disease. Electronically Signed   By: Margarette Canada M.D.   On: 07/14/2017 19:34     12:24 AM Delta trop negative.  Reports  ongoing symptoms 12+ hours with negative work-up, therefore feel ACS less likely.  He is anticoagulated and has no tachycardia or hypoxia to suggest acute PE.  Feel he stable for discharge home.  We will have him follow-up with his cardiologist.  He will return here for any new/acute changes.   Larene Pickett, PA-C 08/01/17 Ladell Heads, MD 08/01/17 [phone]

## 2017-08-08 ENCOUNTER — Telehealth: Payer: Self-pay

## 2017-08-08 NOTE — Telephone Encounter (Signed)
Received from [place], [place] - documents to send to [employer] counselor that have been sent and documents for the patient to sign for DSS if he is to come to the office. Those documents for him to sign are at the front desk.

## 2017-08-08 NOTE — Telephone Encounter (Signed)
Call received from [facility], [place]. She explained that the patient left [employer] for other housing opportunities. She is not sure where he went to as he left no forwarding address. She has documents from [employer]  that he still has not signed.

## 2017-08-11 MED FILL — !BRILINTA 90 MG TABLET: 30 days supply | Qty: 60 | Fill #3

## 2017-08-15 NOTE — Congregational Nurse Program (Signed)
Congregational Nurse Program Note  Date of Encounter: 07/11/2017  Past Medical History: Past Medical History:  Diagnosis Date  . Anxiety   . 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 Coronary artery disease   . Diabetes mellitus without complication ([place])   . GERD (gastroesophageal reflux disease)   . Headache   . Hx of diabetic neuropathy   . Hypertension   . Myocardial infarction ([place]) 2018  . Renal disorder     Encounter Details: CNP Questionnaire - 07/24/17 0945      Questionnaire   Patient Status  Not Applicable    Race  White or Caucasian    Location Patient Served At  [employer]  Not Applicable    Uninsured  Uninsured (Subsequent visits/quarter)    Food  No food insecurities    Housing/Utilities  No permanent housing    Transportation  No transportation needs    Interpersonal Safety  Yes, feel physically and emotionally safe where you currently live    Medication  No medication insecurities    Medical Provider  Yes    Referrals  Primary Care Provider/Clinic;Area Agency    ED Visit Averted  Yes    Life-Saving Intervention Made  Not Applicable      Clinical Intake - 07/24/17 1339      Pre-visit preparation   Pre-visit preparation completed  Yes      Pain   Pain   No/denies pain      Nutrition Screen   Diabetes  Yes    CBG done?  Yes    CBG resulted in Enter/ Edit results?  Yes    Did pt. bring in CBG monitor from home?  No      Functional Status   Activities of Daily Living  Independent    Ambulation  Independent    Medication Administration  Independent    Home Management  Independent      Abuse/Neglect   Do you feel unsafe in your current relationship?  No    Do you feel physically threatened by others?  No    Anyone hurting you at home, work, or school?  No    Unable to ask?  No     BP check. Client is concerned about weight gain and edema. CN will call [place] [facility] to see if client needs to come in to be seen.

## 2017-08-16 NOTE — Congregational Nurse Program (Signed)
Congregational Nurse Program Note  Date of Encounter: 07/17/2017  Past Medical History: Past Medical History:  Diagnosis Date  . Anxiety   . 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 Coronary artery disease   . Diabetes mellitus without complication ([place])   . GERD (gastroesophageal reflux disease)   . Headache   . Hx of diabetic neuropathy   . Hypertension   . Myocardial infarction ([place]) 2018  . Renal disorder     Encounter Details: CNP Questionnaire - 07/24/17 0945      Questionnaire   Patient Status  Not Applicable    Race  White or Caucasian    Location Patient Served At  [facility] [employer] [facility]  Not Applicable    Uninsured  Uninsured (Subsequent visits/quarter)    Food  No food insecurities    Housing/Utilities  No permanent housing    Transportation  No transportation needs    Interpersonal Safety  Yes, feel physically and emotionally safe where you currently live    Medication  No medication insecurities    Medical Provider  Yes    Referrals  Primary Care Provider/Clinic;Area Agency    ED Visit Averted  Yes    Life-Saving Intervention Made  Not Applicable      Clinical Intake - 07/24/17 1339      Pre-visit preparation   Pre-visit preparation completed  Yes      Pain   Pain   No/denies pain      Nutrition Screen   Diabetes  Yes    CBG done?  Yes    CBG resulted in Enter/ Edit results?  Yes    Did pt. bring in CBG monitor from home?  No      Functional Status   Activities of Daily Living  Independent    Ambulation  Independent    Medication Administration  Independent    Home Management  Independent      Abuse/Neglect   Do you feel unsafe in your current relationship?  No    Do you feel physically threatened by others?  No    Anyone hurting you at home, work, or school?  No    Unable to ask?  No     BP check. Client needs to reschedule PCP appointment due to conflict with disability appointment. States that he is not having pain improvement with Lyrica and wants to  re-start gabapentin. CN advised speaking with PCP re: pain issues during next appoint ment.

## 2017-08-17 ENCOUNTER — Other Ambulatory Visit: Payer: Self-pay | Admitting: Family Medicine

## 2017-08-17 MED FILL — ?FUROSEMIDE 20 MG TABLET: 20 | 30 days supply | Qty: 90 | Fill #2

## 2017-08-17 MED FILL — $LANTUS SOLOSTAR 100 UNITS/: 100 | 35 days supply | Qty: 15 | Fill #2

## 2017-08-17 MED FILL — METHOCARBAMOL 500 MG TABS: 500 | 30 days supply | Qty: 90 | Fill #2

## 2017-08-17 MED FILL — POTASSIUM CL ER 20 MEQ TAB: 20 | 30 days supply | Qty: 30 | Fill #2

## 2017-08-17 MED FILL — LISINOPRIL 40 MG TABLET: 40 | 30 days supply | Qty: 30 | Fill #3

## 2017-08-17 MED FILL — ?DULOXetine HCL 60MG CAPS: 60 | 30 days supply | Qty: 30 | Fill #3

## 2017-08-17 MED FILL — !NOVOLOG FLEXPEN SYRINGE 1: 100/ML | 30 days supply | Qty: 9 | Fill #3

## 2017-08-17 MED FILL — ATORVASTATIN 80 MG TABLET: 80 | 30 days supply | Qty: 30 | Fill #3

## 2017-08-17 MED FILL — GABAPENTIN 300 MG CAPSULE: 300 | 30 days supply | Qty: 270 | Fill #1

## 2017-08-17 MED FILL — AMLODIPINE BESYLATE 10 MG T: 10 | 30 days supply | Qty: 30 | Fill #3

## 2017-08-17 MED FILL — ?METOPROLOL 100 MG TABLET: 100 | 30 days supply | Qty: 60 | Fill #3

## 2017-08-17 MED FILL — ?LIDOCAINE 5% PATCH: 5 | 30 days supply | Qty: 30 | Fill #1

## 2017-08-17 MED FILL — ?OMEPRazole 20mg CPDR: 20 | 30 days supply | Qty: 30 | Fill #3

## 2017-08-25 NOTE — Congregational Nurse Program (Signed)
BP check. Client states he continues to have chest pain but it is no different than the chest pain he's been having recently. Client requests help with setting up medication as he is unable to see pill bottles. CN will provide medication organizer for client. CN will assist client with financial assistance  Paperwork.

## 2017-09-12 ENCOUNTER — Ambulatory Visit: Payer: Self-pay | Admitting: Family Medicine

## 2017-10-20 ENCOUNTER — Other Ambulatory Visit: Payer: Self-pay | Admitting: Family Medicine

## 2017-10-20 DIAGNOSIS — F329 Major depressive disorder, single episode, unspecified: Secondary | ICD-10-CM

## 2017-10-20 DIAGNOSIS — I25110 Atherosclerotic heart disease of native coronary artery with unstable angina pectoris: Secondary | ICD-10-CM

## 2017-10-20 DIAGNOSIS — G8929 Other chronic pain: Secondary | ICD-10-CM

## 2017-10-20 DIAGNOSIS — I10 Essential (primary) hypertension: Secondary | ICD-10-CM

## 2017-10-20 DIAGNOSIS — F419 Anxiety disorder, unspecified: Principal | ICD-10-CM

## 2017-10-20 DIAGNOSIS — K219 Gastro-esophageal reflux disease without esophagitis: Secondary | ICD-10-CM

## 2018-01-10 DIAGNOSIS — I219 Acute myocardial infarction, unspecified: Secondary | ICD-10-CM

## 2018-01-10 HISTORY — DX: Acute myocardial infarction, unspecified: I21.9

## 2018-12-15 IMAGING — DX DG CHEST 2V
2 series · 2 of 2 positions shown · non-contrast
Comparison: Chest x-ray dated May 14, 2017.

CLINICAL DATA: Chest pain.

EXAM:
CHEST - 2 VIEW

[w chest pa]
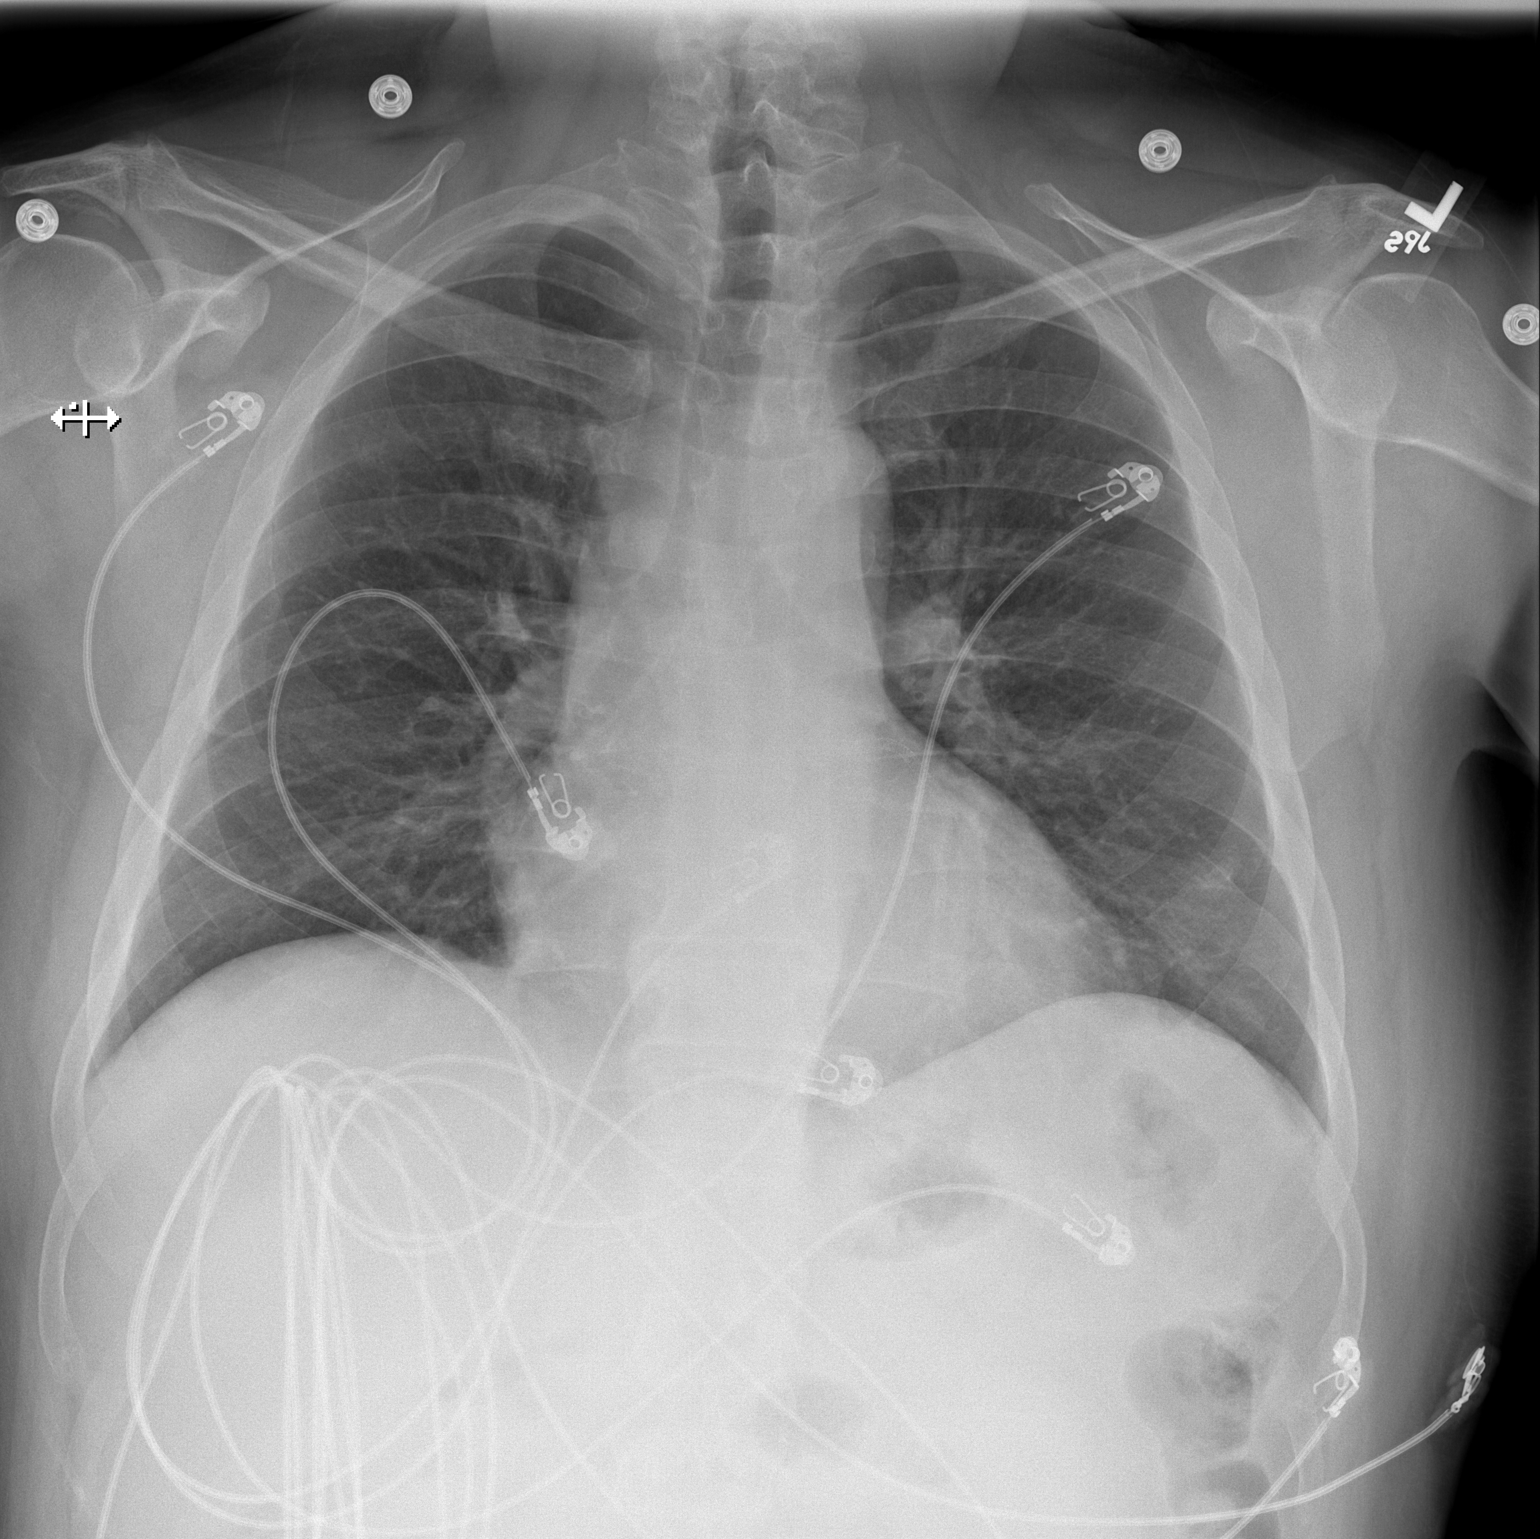

[w chest lat]
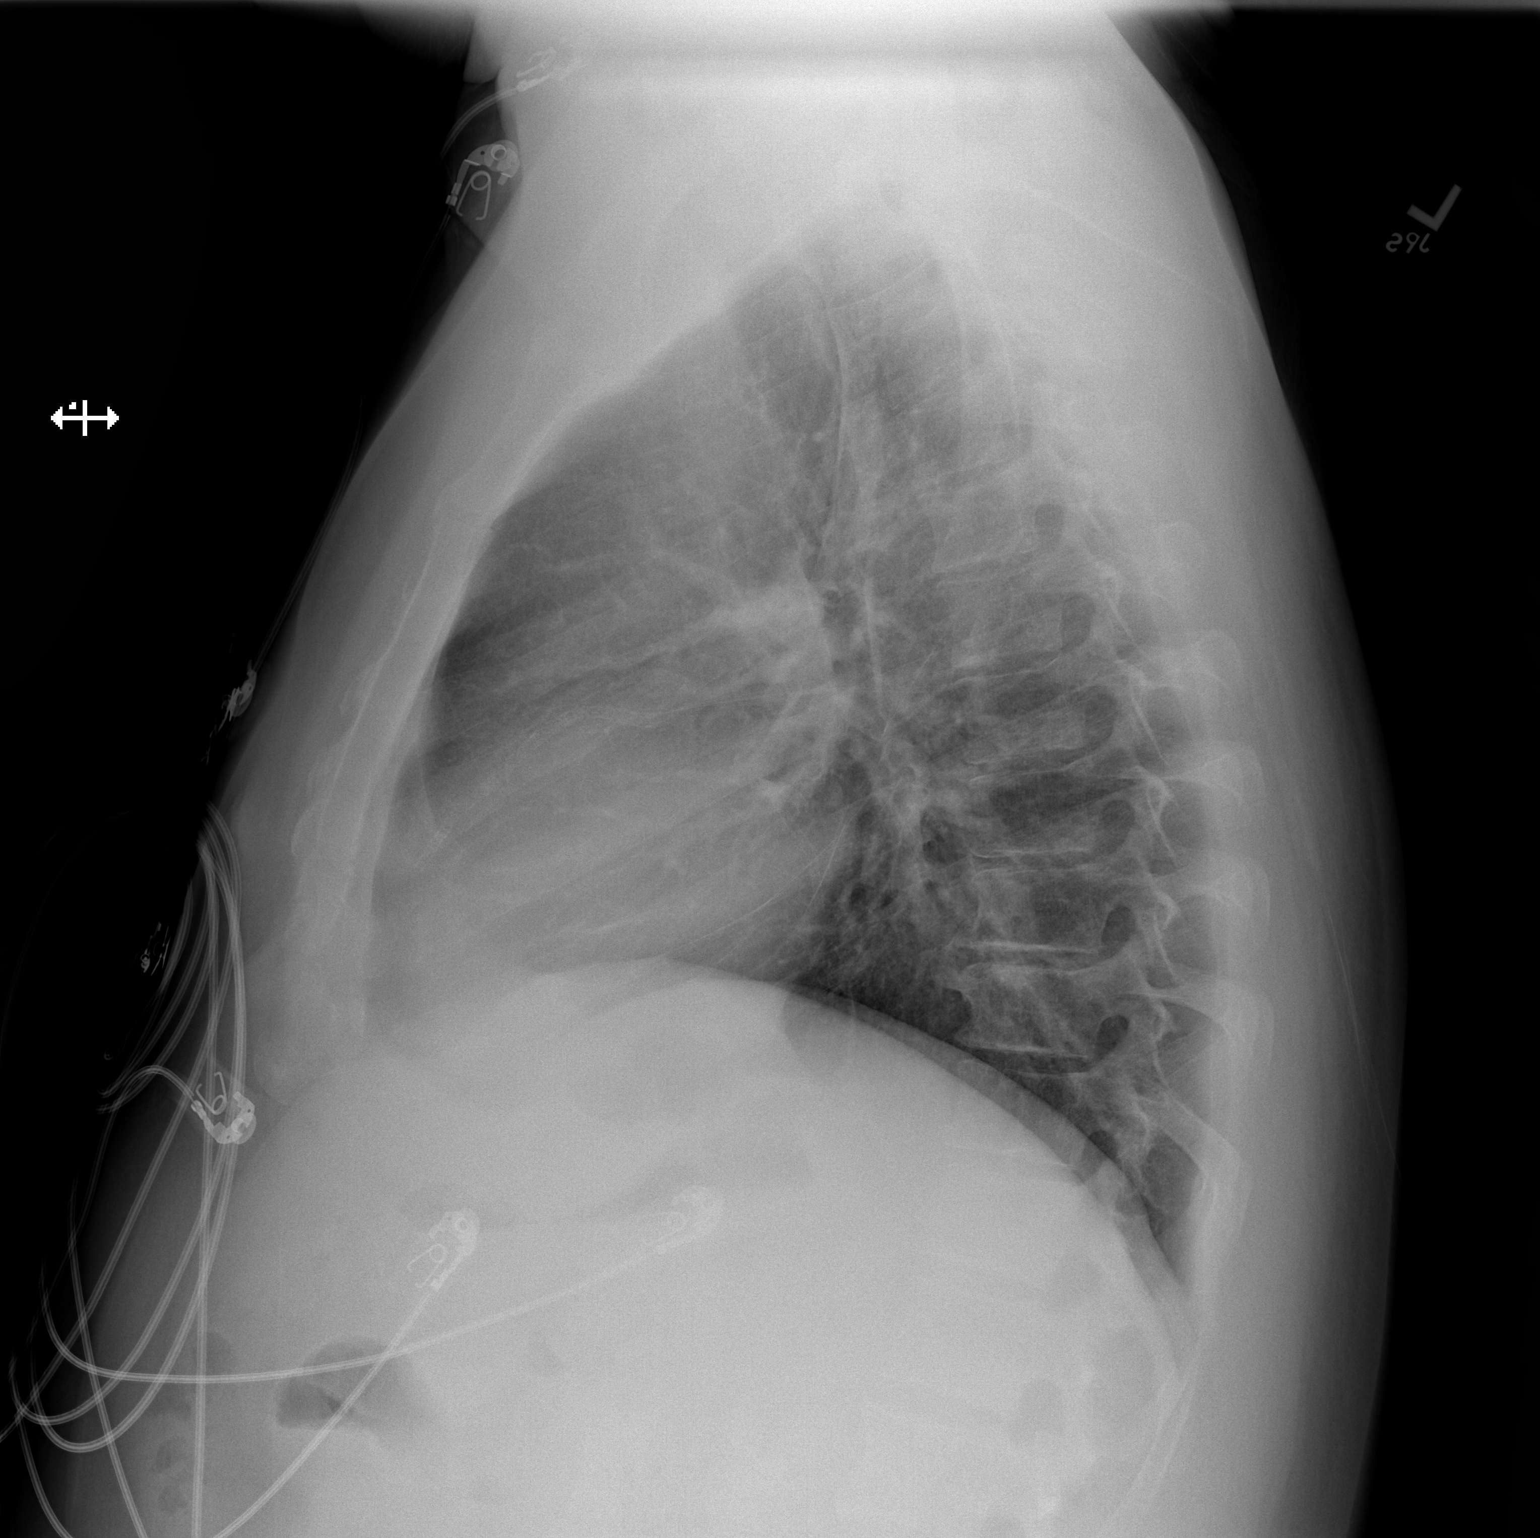

[2 of 2 positions shown; findings below may reference images not displayed]

FINDINGS: The heart size and mediastinal contours are within normal limits.
Both lungs are clear. The visualized skeletal structures are
unremarkable.
IMPRESSION: No active cardiopulmonary disease.

## 2018-12-15 IMAGING — CT CT ANGIO CHEST
3 of 7 series · 18 of 36 positions shown · IV contrast (APPLIED)
Comparison: Chest x-ray earlier the same day

ADDENDUM:
Regarding the right lung nodules measuring up to 4 mm. No follow-up
needed if patient is low-risk (and has no known or suspected primary
neoplasm). Non-contrast chest CT can be considered in 12 months if
patient is high-risk. This recommendation follows the consensus
statement: Guidelines for Management of Incidental Pulmonary Nodules
Detected on CT Images: From the [HOSPITAL] 9867; Radiology
9867; [DATE].
CLINICAL DATA: Chest pain radiates into left arm and jaw. Positive
D-dimer.

EXAM:
CT ANGIOGRAPHY CHEST WITH CONTRAST
TECHNIQUE: Multidetector CT imaging of the chest was performed using the
standard protocol during bolus administration of intravenous
contrast. Multiplanar CT image reconstructions and MIPs were
obtained to evaluate the vascular anatomy.
CONTRAST:  100mL [id number] IOPAMIDOL ([id number]) INJECTION 76%

[Series 6: lung · axial · 0.75mm/px · z∈[+979,+1091]mm · 3 of 141 slices shown]
[im 29/141  mediastinal]
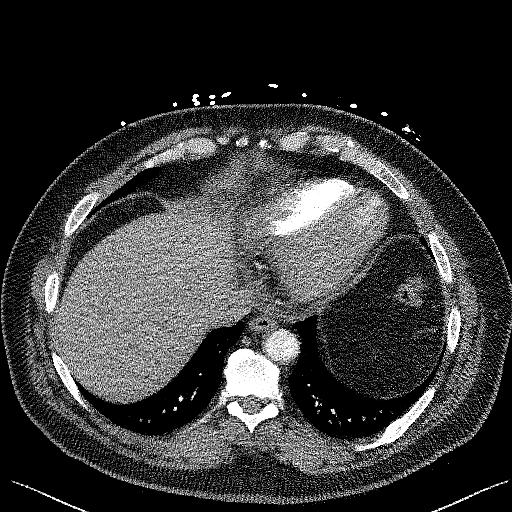
[im 57/141  mediastinal]
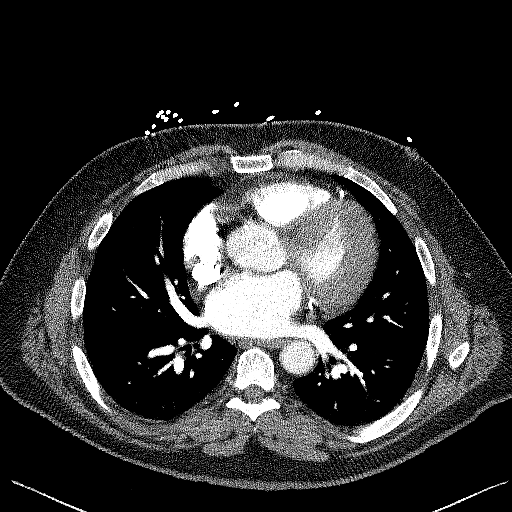
[im 85/141  mediastinal]
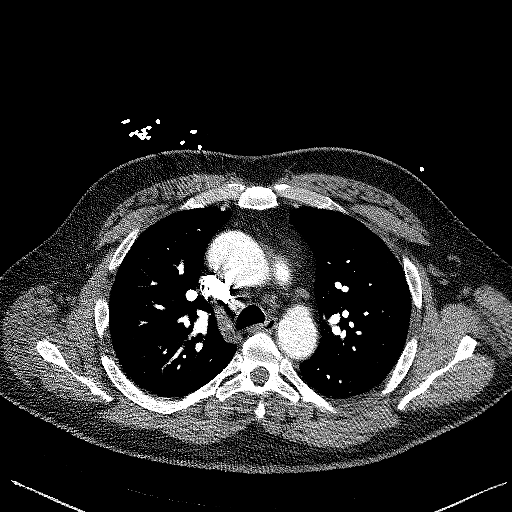

[Series 7: thins · axial · 0.75mm/px · z∈[+941,+1184]mm · 14 of 401 slices shown]
[im 27/401  lung]
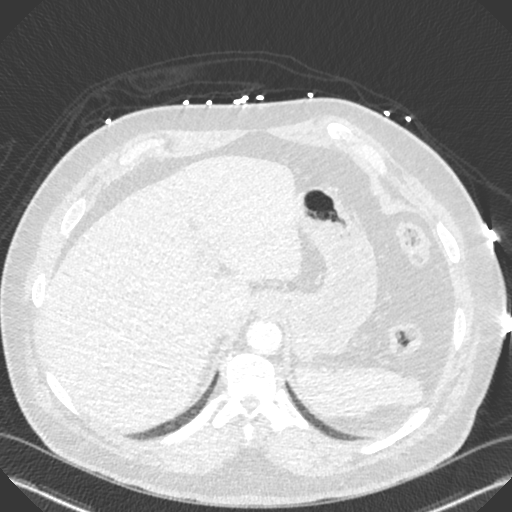
[im 54/401  mediastinal]
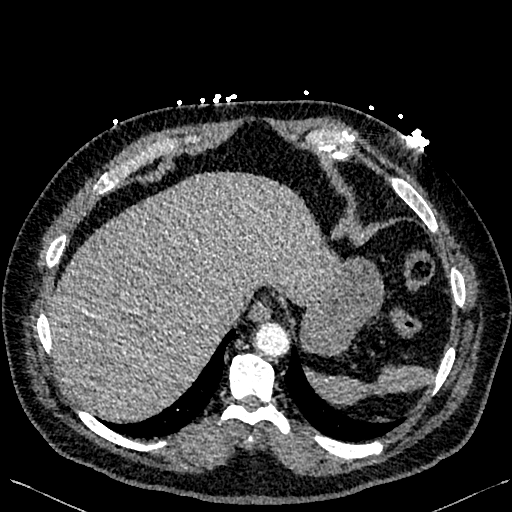
[im 81/401  lung]
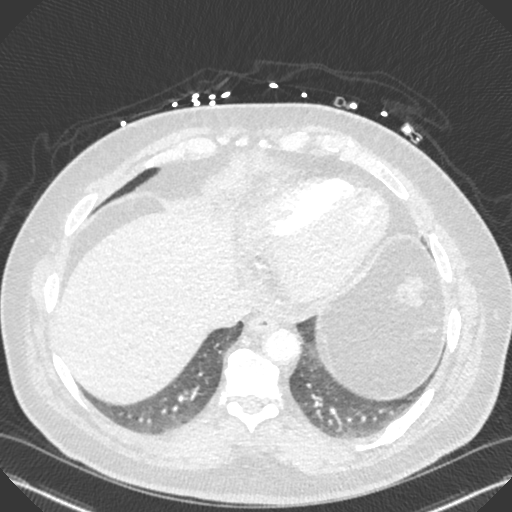
[im 107/401  mediastinal]
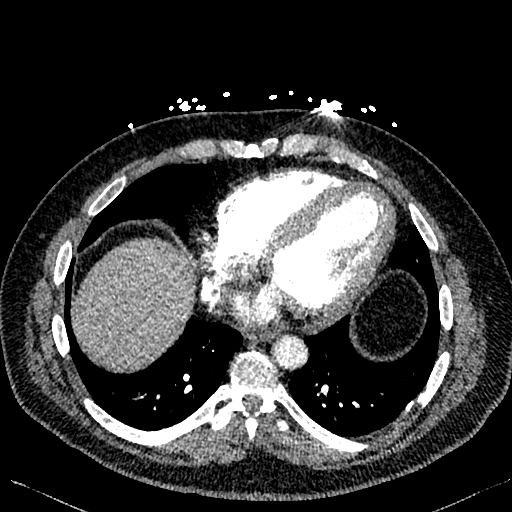
[im 134/401  lung]
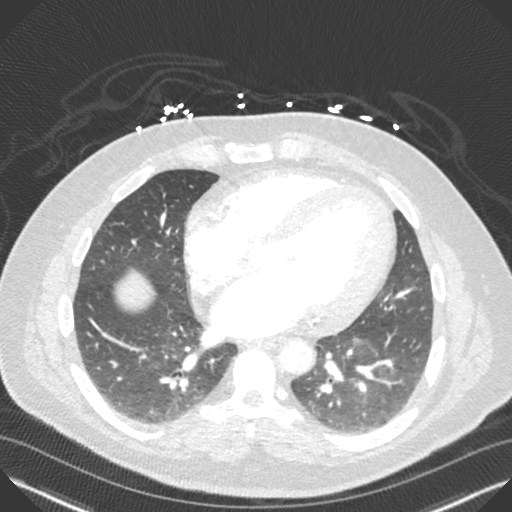
[im 161/401  mediastinal]
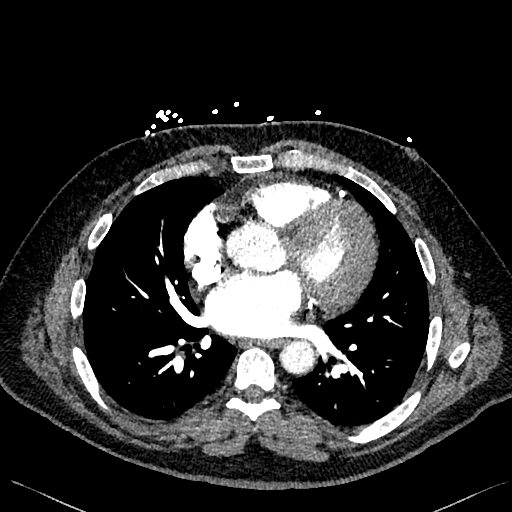
[im 187/401  lung]
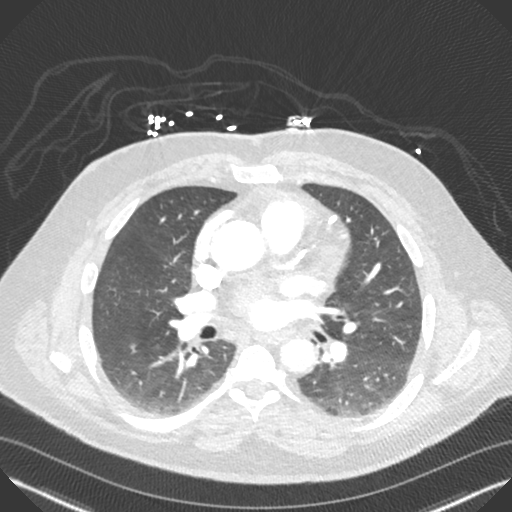
[im 214/401  mediastinal]
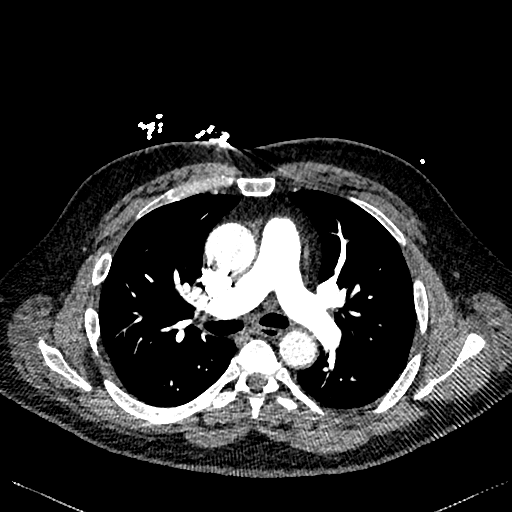
[im 241/401  lung]
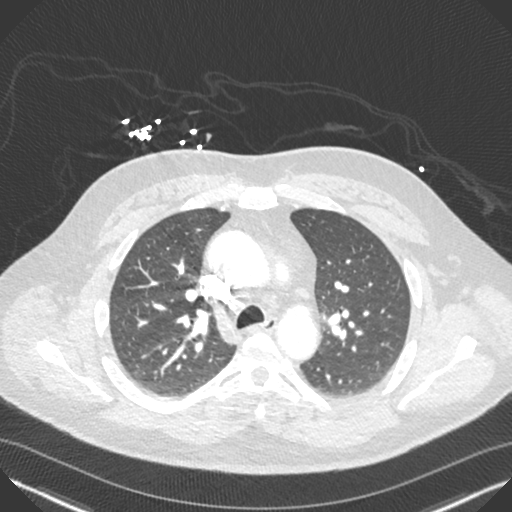
[im 267/401  mediastinal]
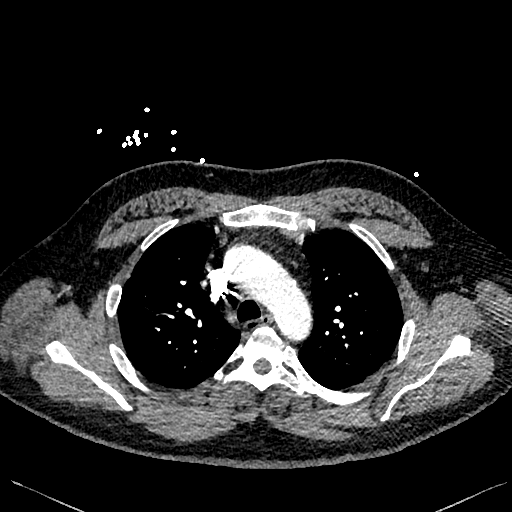
[im 294/401  lung]
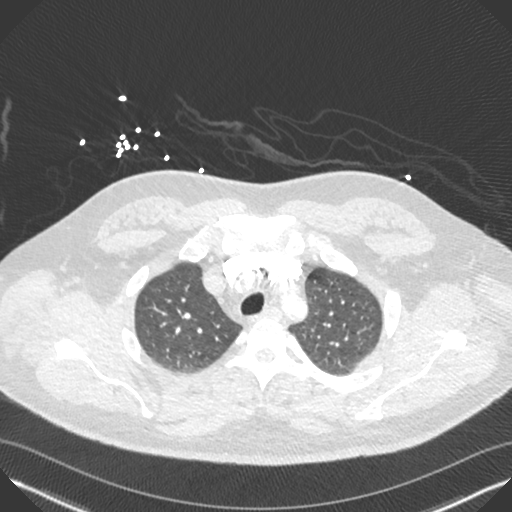
[im 321/401  mediastinal]
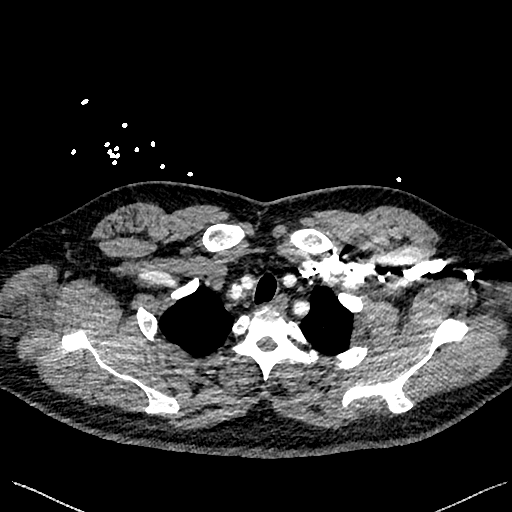
[im 347/401  lung]
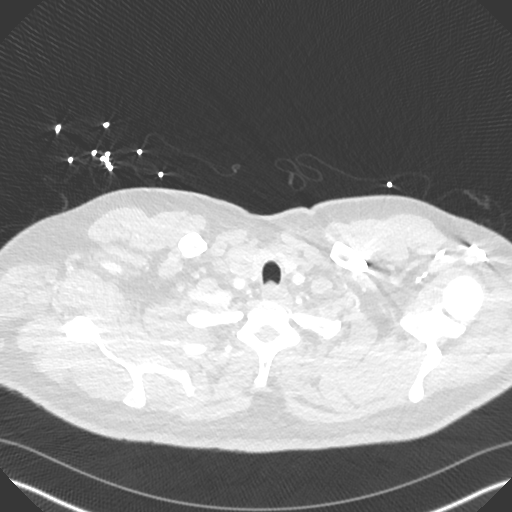
[im 374/401  mediastinal]
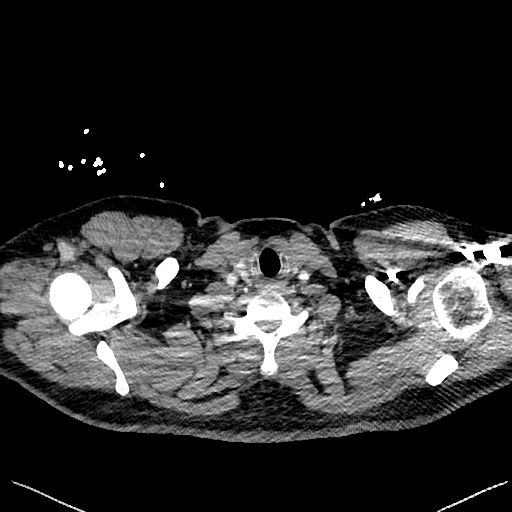

[Series 8: cor · coronal · 0.57mm/px · 1 of 136 slices shown]
[im 68/136  mediastinal]
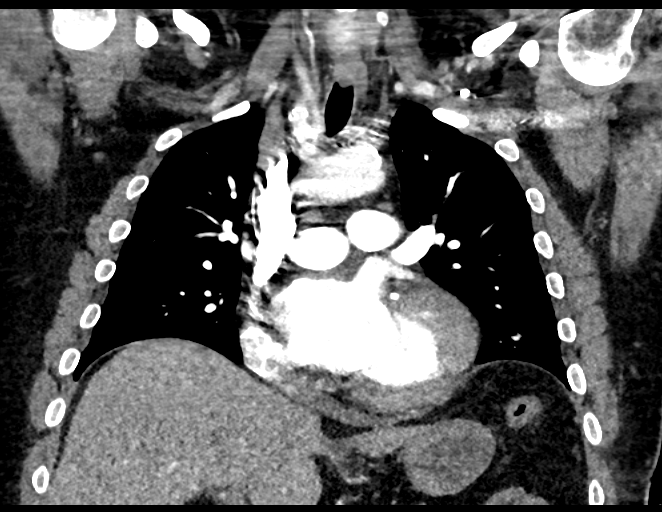

[18 of 36 positions shown; findings below may reference images not displayed]

FINDINGS: Cardiovascular: Heart size is enlarged. No pericardial effusion.
Coronary stent device evident. There is abdominal aortic
atherosclerosis without aneurysm. No filling defect in the opacified
pulmonary arteries to suggest the presence of an acute pulmonary
embolus.

Mediastinum/Nodes: No mediastinal lymphadenopathy. There is no hilar
lymphadenopathy. The esophagus has normal imaging features. There is
no axillary lymphadenopathy. 2.1 cm heterogeneous right thyroid
nodule evident.

Lungs/Pleura: The central tracheobronchial airways are patent. 2 mm
right upper lobe nodule identified on image 61/series 6. 4 mm
posterior right lung nodule noted (76/6). No focal airspace
consolidation. No pulmonary edema or pleural effusion.

Upper Abdomen: Unremarkable.

Musculoskeletal: Bone windows reveal no worrisome lytic or sclerotic
osseous lesions. Nonacute posterior left rib fractures evident.

Review of the MIP images confirms the above findings.
IMPRESSION: 1. No CT evidence for acute pulmonary embolus. No CT findings to
explain the patient's history of central chest pain.
2. 2.1 cm right thyroid nodule. Thyroid ultrasound recommended to
further evaluate. This follows ACR consensus guidelines: Managing
Incidental Thyroid Nodules Detected on Imaging: White Paper of [REDACTED]. [HOSPITAL] 1840;

## 2019-01-27 IMAGING — DX DG CHEST 2V
2 series · 2 of 2 positions shown · non-contrast
Comparison: 06/16/2017

CLINICAL DATA: Left-sided chest pain radiating down the left arm
since this morning. Shortness of breath. History of hypertension,
diabetes, nonsmoker.

EXAM:
CHEST - 2 VIEW

[w chest pa]
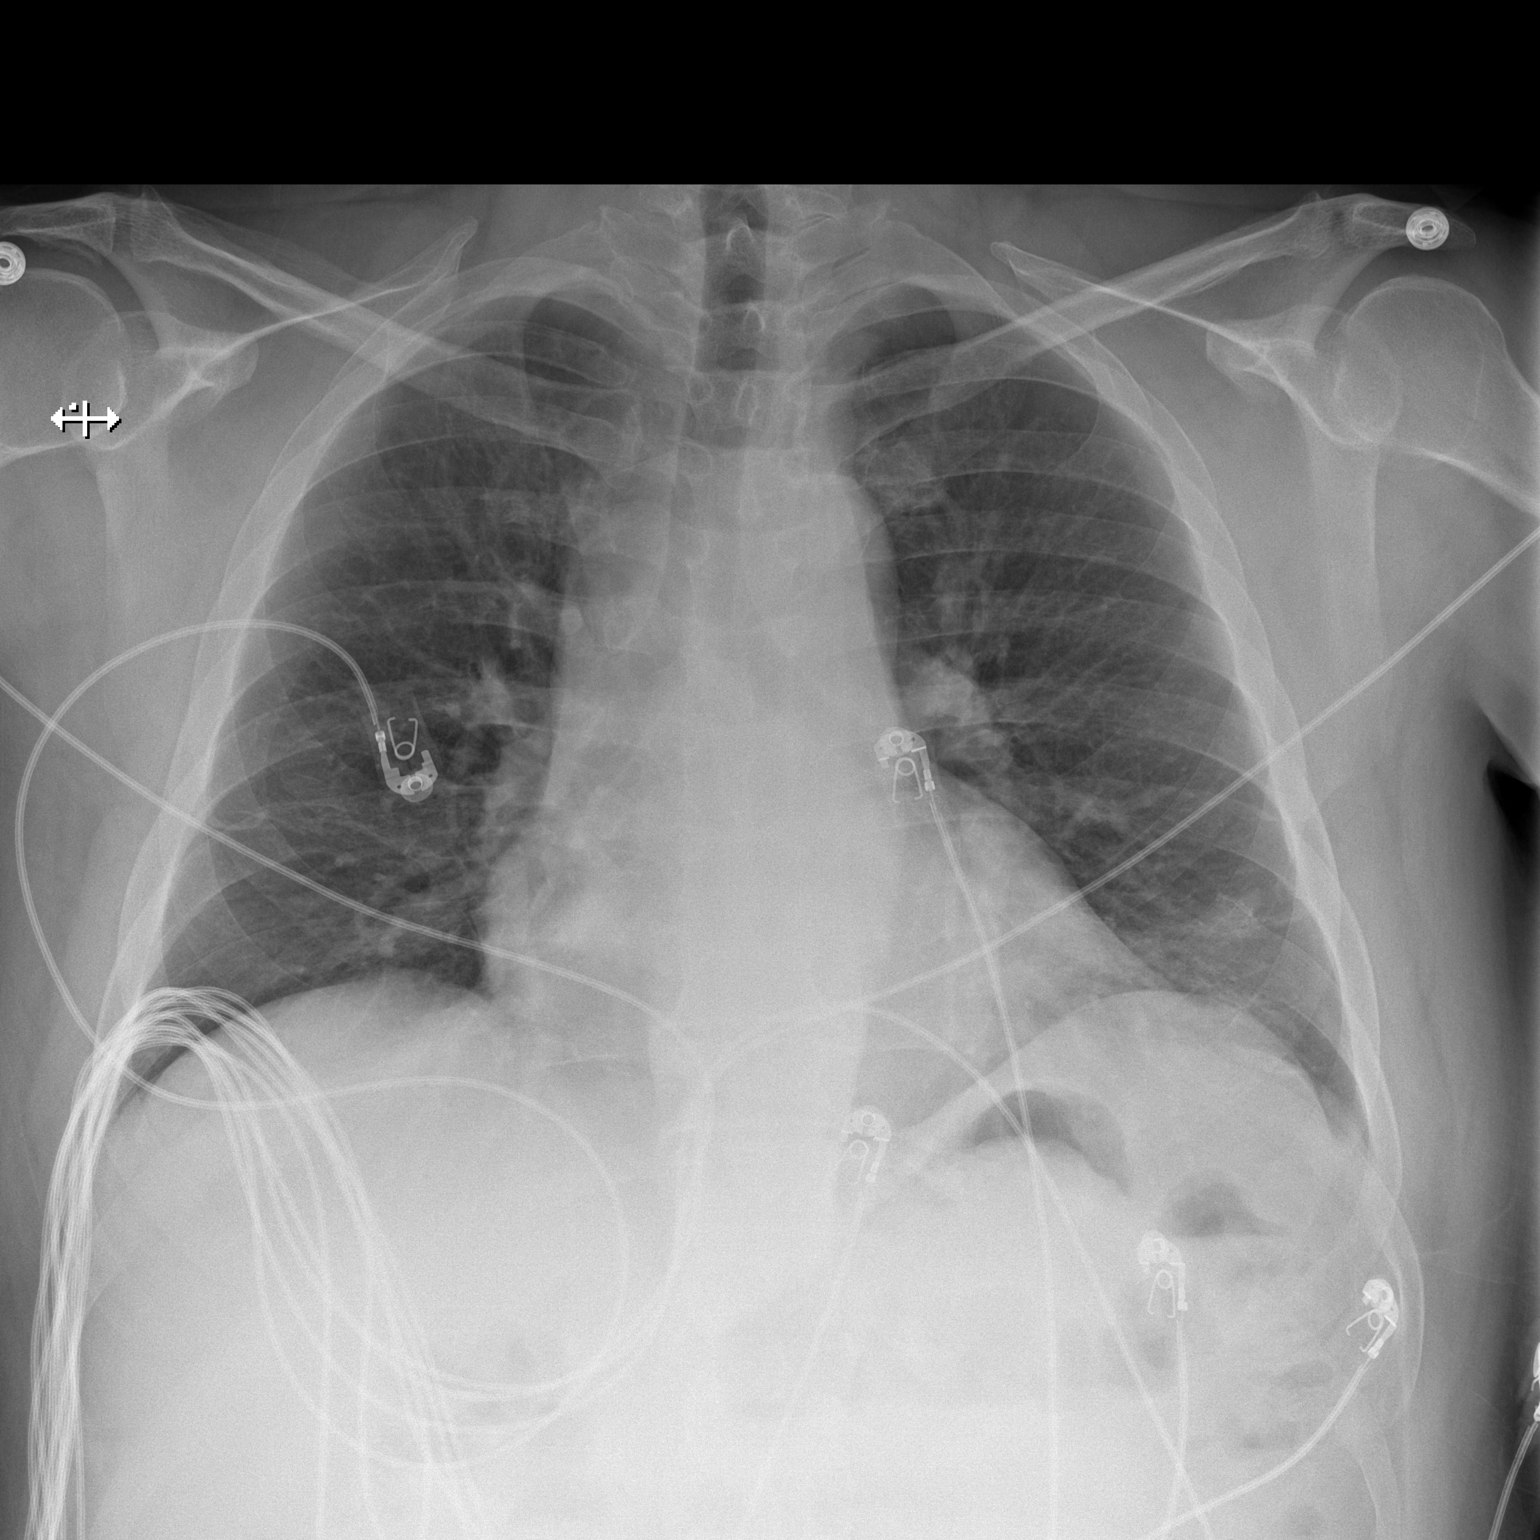

[w chest lat]
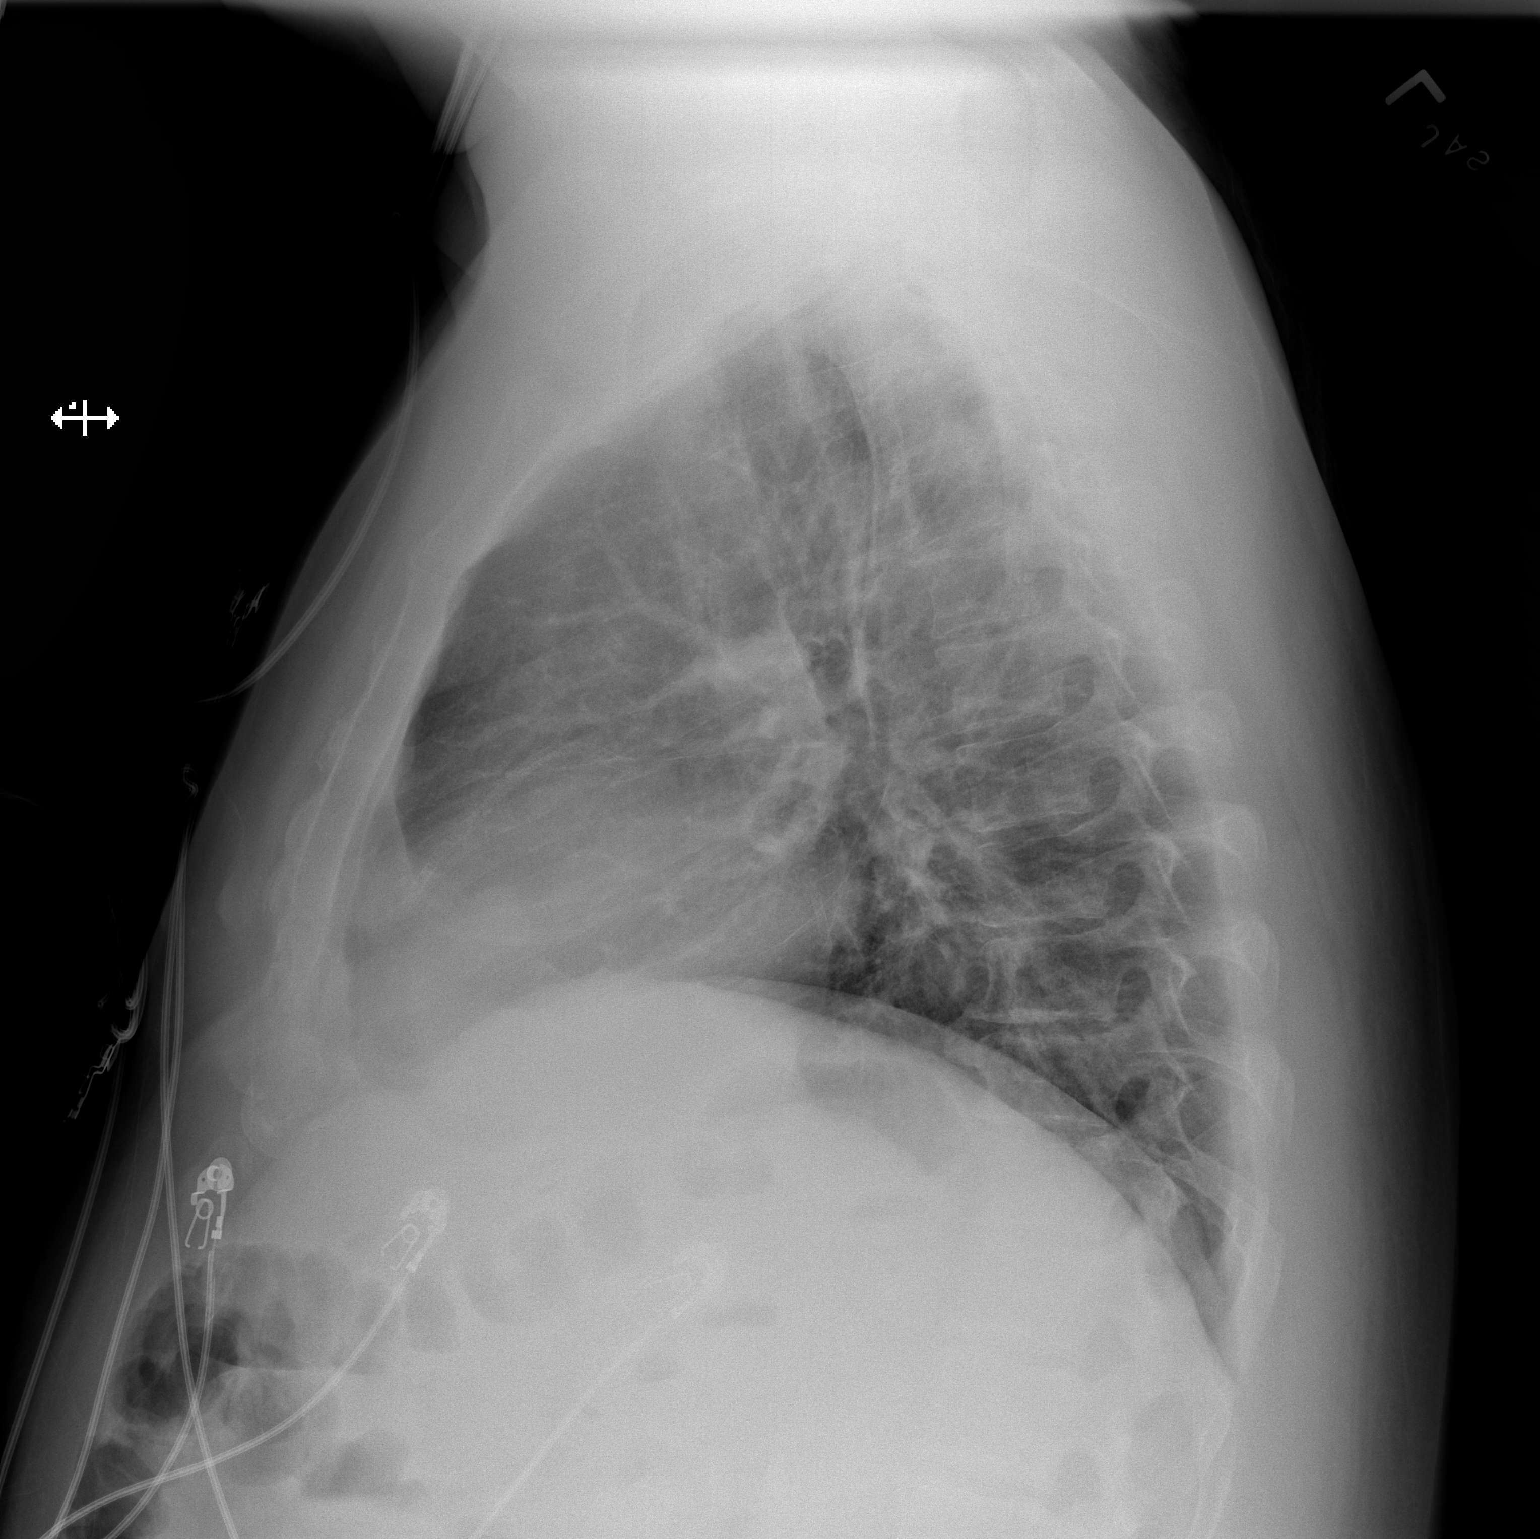

[2 of 2 positions shown; findings below may reference images not displayed]

FINDINGS: Shallow inspiration. Linear atelectasis or scarring in the right mid
lung and left lung base. No change since prior study. No
consolidation or edema. No blunting of costophrenic angles. No
pneumothorax. Mediastinal contours appear intact. Heart size and
pulmonary vascularity are normal. Coronary artery stents.
Degenerative changes in the spine.
IMPRESSION: Linear atelectasis or scarring in the right mid lung and left lung
base similar to previous study. No evidence of active disease.

## 2019-05-03 ENCOUNTER — Encounter: Payer: Self-pay | Admitting: Gastroenterology

## 2019-07-31 DIAGNOSIS — I739 Peripheral vascular disease, unspecified: Secondary | ICD-10-CM

## 2019-08-09 DIAGNOSIS — I739 Peripheral vascular disease, unspecified: Secondary | ICD-10-CM

## 2019-08-25 ENCOUNTER — Other Ambulatory Visit: Payer: Self-pay

## 2019-08-25 ENCOUNTER — Encounter (HOSPITAL_COMMUNITY): Payer: Self-pay | Admitting: Internal Medicine

## 2019-08-25 ENCOUNTER — Inpatient Hospital Stay (HOSPITAL_COMMUNITY): Payer: Medicare Other | Admitting: Internal Medicine

## 2019-08-25 ENCOUNTER — Inpatient Hospital Stay
Admission: EM | Admit: 2019-08-25 | Discharge: 2019-08-27 | DRG: 303 | Disposition: A | Discharge status: Home / Self Care | Payer: Medicare Other | Source: Other Acute Inpatient Hospital | Attending: Internal Medicine | Admitting: Internal Medicine

## 2019-08-25 DIAGNOSIS — R079 Chest pain, unspecified: Secondary | ICD-10-CM | POA: Diagnosis present

## 2019-08-25 DIAGNOSIS — Z955 Presence of coronary angioplasty implant and graft: Secondary | ICD-10-CM

## 2019-08-25 DIAGNOSIS — Z7902 Long term (current) use of antithrombotics/antiplatelets: Secondary | ICD-10-CM

## 2019-08-25 DIAGNOSIS — I130 Hypertensive heart and chronic kidney disease with heart failure and stage 1 through stage 4 chronic kidney disease, or unspecified chronic kidney disease: Secondary | ICD-10-CM | POA: Diagnosis present

## 2019-08-25 DIAGNOSIS — Z79899 Other long term (current) drug therapy: Secondary | ICD-10-CM

## 2019-08-25 DIAGNOSIS — Z86718 Personal history of other venous thrombosis and embolism: Secondary | ICD-10-CM

## 2019-08-25 DIAGNOSIS — L97519 Non-pressure chronic ulcer of other part of right foot with unspecified severity: Secondary | ICD-10-CM | POA: Diagnosis present

## 2019-08-25 DIAGNOSIS — Z7982 Long term (current) use of aspirin: Secondary | ICD-10-CM

## 2019-08-25 DIAGNOSIS — E1122 Type 2 diabetes mellitus with diabetic chronic kidney disease: Secondary | ICD-10-CM | POA: Diagnosis present

## 2019-08-25 DIAGNOSIS — E1142 Type 2 diabetes mellitus with diabetic polyneuropathy: Secondary | ICD-10-CM | POA: Diagnosis present

## 2019-08-25 DIAGNOSIS — N183 Chronic kidney disease, stage 3 unspecified: Secondary | ICD-10-CM

## 2019-08-25 DIAGNOSIS — E11621 Type 2 diabetes mellitus with foot ulcer: Secondary | ICD-10-CM | POA: Diagnosis present

## 2019-08-25 DIAGNOSIS — I25118 Atherosclerotic heart disease of native coronary artery with other forms of angina pectoris: Principal | ICD-10-CM | POA: Diagnosis present

## 2019-08-25 DIAGNOSIS — E785 Hyperlipidemia, unspecified: Secondary | ICD-10-CM | POA: Diagnosis present

## 2019-08-25 DIAGNOSIS — I5022 Chronic systolic (congestive) heart failure: Secondary | ICD-10-CM | POA: Diagnosis present

## 2019-08-25 DIAGNOSIS — E1151 Type 2 diabetes mellitus with diabetic peripheral angiopathy without gangrene: Secondary | ICD-10-CM | POA: Diagnosis present

## 2019-08-25 DIAGNOSIS — N1831 Chronic kidney disease, stage 3a: Secondary | ICD-10-CM | POA: Diagnosis present

## 2019-08-25 DIAGNOSIS — I255 Ischemic cardiomyopathy: Secondary | ICD-10-CM | POA: Diagnosis present

## 2019-08-25 HISTORY — DX: Atherosclerotic heart disease of native coronary artery without angina pectoris: I25.10

## 2019-08-25 HISTORY — DX: Essential (primary) hypertension: I10

## 2019-08-25 HISTORY — DX: Type 2 diabetes mellitus without complications: E11.9

## 2019-08-25 HISTORY — DX: Arthropathy, unspecified: M12.9

## 2019-08-25 HISTORY — DX: Heart failure, unspecified: I50.9

## 2019-08-25 HISTORY — DX: Headache, unspecified: R51.9

## 2019-08-25 HISTORY — DX: Chronic kidney disease, stage 3 unspecified: N18.30

## 2019-08-25 LAB — TROPONIN-I: TROPONIN I: 23 ng/L (ref 7–30)

## 2019-08-25 LAB — CBC WITH DIFF
BASOPHIL #: <0.1 10*3/uL (ref <=0.20)
BASOPHIL %: 1 %
EOSINOPHIL #: 0.13 10*3/uL (ref <=0.50)
EOSINOPHIL %: 2 %
HCT: 35.6 % — ABNORMAL LOW (ref 38.9–52.0)
HGB: 11 g/dL — ABNORMAL LOW (ref 13.4–17.5)
IMMATURE GRANULOCYTE #: <0.1 10*3/uL (ref <0.10)
IMMATURE GRANULOCYTE %: 0 % (ref 0–1)
LYMPHOCYTE #: 1.74 10*3/uL (ref 1.00–4.80)
LYMPHOCYTE %: 23 %
MCH: 27.8 pg (ref 26.0–32.0)
MCHC: 30.9 g/dL — ABNORMAL LOW (ref 31.0–35.5)
MCV: 90.1 fL (ref 78.0–100.0)
MONOCYTE #: 0.98 10*3/uL (ref 0.20–1.10)
MONOCYTE %: 13 %
MPV: 9.5 fL (ref 8.7–12.5)
NEUTROPHIL #: 4.55 10*3/uL (ref 1.50–7.70)
NEUTROPHIL %: 61 %
PLATELETS: 528 10*3/uL — ABNORMAL HIGH (ref 150–400)
RBC: 3.95 10*6/uL — ABNORMAL LOW (ref 4.50–6.10)
RDW-CV: 15.9 % — ABNORMAL HIGH (ref 11.5–15.5)
WBC: 7.5 10*3/uL (ref 3.7–11.0)

## 2019-08-25 LAB — COMPREHENSIVE METABOLIC PANEL, NON-FASTING
ALBUMIN: 3.2 g/dL — ABNORMAL LOW (ref 3.5–5.0)
ALKALINE PHOSPHATASE: 111 U/L (ref 45–115)
ALT (SGPT): 10 U/L (ref 10–55)
ANION GAP: 11 mmol/L (ref 4–13)
AST (SGOT): 15 U/L (ref 8–45)
BILIRUBIN TOTAL: 0.3 mg/dL (ref 0.3–1.3)
BUN/CREA RATIO: 13 (ref 6–22)
BUN: 21 mg/dL (ref 8–25)
CALCIUM: 9.3 mg/dL (ref 8.5–10.0)
CHLORIDE: 101 mmol/L (ref 96–111)
CO2 TOTAL: 23 mmol/L (ref 22–30)
CREATININE: 1.61 mg/dL — ABNORMAL HIGH (ref 0.75–1.35)
ESTIMATED GFR: 50 mL/min/BSA — ABNORMAL LOW (ref >=60)
GLUCOSE: 235 mg/dL — ABNORMAL HIGH (ref 65–125)
POTASSIUM: 3.8 mmol/L (ref 3.5–5.1)
PROTEIN TOTAL: 8.1 g/dL (ref 6.4–8.3)
SODIUM: 135 mmol/L — ABNORMAL LOW (ref 136–145)

## 2019-08-25 LAB — POC BLOOD GLUCOSE (RESULTS)
GLUCOSE, POC: 229 mg/dl (ref 80–130)
GLUCOSE, POC: 228 mg/dl (ref 80–130)
GLUCOSE, POC: 252 mg/dl (ref 80–130)

## 2019-08-25 LAB — LACTIC ACID LEVEL W/ REFLEX FOR LEVEL >2.0: LACTIC ACID: 1.4 mmol/L (ref 0.5–2.2)

## 2019-08-25 LAB — PHOSPHORUS: PHOSPHORUS: 4 mg/dL (ref 2.4–4.7)

## 2019-08-25 LAB — MAGNESIUM: MAGNESIUM: 2 mg/dL (ref 1.8–2.6)

## 2019-08-25 MED ORDER — TICAGRELOR 90 MG TABLET
90.0000 mg | ORAL_TABLET | Freq: Two times a day (BID) | ORAL | Status: DC
Start: 2019-08-25 — End: 2019-08-27
  Administered 2019-08-26: 0 mg via ORAL
  Administered 2019-08-26 – 2019-08-27 (×2): 90 mg via ORAL
  Filled 2019-08-25 (×2): qty 1

## 2019-08-25 MED ORDER — CARVEDILOL 12.5 MG TABLET
25.0000 mg | ORAL_TABLET | Freq: Two times a day (BID) | ORAL | Status: DC
Start: 2019-08-25 — End: 2019-08-27
  Administered 2019-08-25 – 2019-08-27 (×4): 25 mg via ORAL
  Filled 2019-08-25 (×4): qty 2

## 2019-08-25 MED ORDER — SPIRONOLACTONE 25 MG TABLET
50.0000 mg | ORAL_TABLET | Freq: Every morning | ORAL | Status: DC
Start: 2019-08-26 — End: 2019-08-27
  Administered 2019-08-26 – 2019-08-27 (×2): 50 mg via ORAL
  Filled 2019-08-25 (×2): qty 2

## 2019-08-25 MED ORDER — ASPIRIN 81 MG CHEWABLE TABLET
81.0000 mg | CHEWABLE_TABLET | Freq: Every day | ORAL | Status: DC
Start: 2019-08-26 — End: 2019-08-27
  Administered 2019-08-26 – 2019-08-27 (×2): 81 mg via ORAL
  Filled 2019-08-25 (×2): qty 1

## 2019-08-25 MED ORDER — AMLODIPINE 10 MG TABLET
10.0000 mg | ORAL_TABLET | Freq: Every day | ORAL | Status: DC
Start: 2019-08-26 — End: 2019-08-27
  Administered 2019-08-26 – 2019-08-27 (×2): 10 mg via ORAL
  Filled 2019-08-25 (×2): qty 1

## 2019-08-25 MED ORDER — ISOSORBIDE MONONITRATE ER 60 MG TABLET,EXTENDED RELEASE 24 HR
120.0000 mg | ORAL_TABLET | Freq: Every morning | ORAL | Status: DC
Start: 2019-08-26 — End: 2019-08-27
  Administered 2019-08-26: 0 mg via ORAL
  Administered 2019-08-27: 120 mg via ORAL
  Filled 2019-08-25: qty 2

## 2019-08-25 MED ORDER — GABAPENTIN 300 MG CAPSULE
300.0000 mg | ORAL_CAPSULE | Freq: Three times a day (TID) | ORAL | Status: DC
Start: 2019-08-25 — End: 2019-08-27
  Administered 2019-08-25 – 2019-08-27 (×5): 300 mg via ORAL
  Filled 2019-08-25 (×5): qty 1

## 2019-08-25 MED ORDER — ATORVASTATIN 40 MG TABLET
80.0000 mg | ORAL_TABLET | Freq: Every evening | ORAL | Status: DC
Start: 2019-08-25 — End: 2019-08-27
  Administered 2019-08-25 – 2019-08-26 (×2): 80 mg via ORAL
  Filled 2019-08-25 (×2): qty 2

## 2019-08-25 MED ORDER — INSULIN ASPART (U-100) 100 UNIT/ML (3 ML) SUBCUTANEOUS PEN
20.0000 [IU] | PEN_INJECTOR | Freq: Three times a day (TID) | SUBCUTANEOUS | Status: DC
Start: 2019-08-26 — End: 2019-08-27
  Administered 2019-08-26 – 2019-08-27 (×4): 20 [IU] via SUBCUTANEOUS
  Filled 2019-08-25: qty 300

## 2019-08-25 MED ORDER — MORPHINE 2 MG/ML INTRAVENOUS SYRINGE
2.0000 mg | INJECTION | INTRAVENOUS | Status: AC
Start: 2019-08-25 — End: 2019-08-25
  Administered 2019-08-25: 2 mg via INTRAVENOUS
  Filled 2019-08-25: qty 1

## 2019-08-25 MED ORDER — ACETAMINOPHEN 500 MG TABLET
500.0000 mg | ORAL_TABLET | ORAL | Status: DC | PRN
Start: 2019-08-25 — End: 2019-08-27
  Administered 2019-08-25: 500 mg via ORAL
  Filled 2019-08-25: qty 1

## 2019-08-25 MED ORDER — MAGNESIUM HYDROXIDE 400 MG/5 ML ORAL SUSPENSION
15.0000 mL | Freq: Every day | ORAL | Status: DC | PRN
Start: 2019-08-25 — End: 2019-08-27

## 2019-08-25 MED ORDER — ONDANSETRON HCL (PF) 4 MG/2 ML INJECTION SOLUTION
4.0000 mg | Freq: Four times a day (QID) | INTRAMUSCULAR | Status: DC | PRN
Start: 2019-08-25 — End: 2019-08-27
  Administered 2019-08-25: 4 mg via INTRAVENOUS
  Filled 2019-08-25: qty 2

## 2019-08-25 MED ORDER — LISINOPRIL 5 MG TABLET
2.5000 mg | ORAL_TABLET | Freq: Every day | ORAL | Status: DC
Start: 2019-08-26 — End: 2019-08-26
  Administered 2019-08-26: 2.5 mg via ORAL
  Filled 2019-08-25: qty 1

## 2019-08-25 MED ORDER — SODIUM CHLORIDE 0.9 % (FLUSH) INJECTION SYRINGE
3.0000 mL | INJECTION | Freq: Three times a day (TID) | INTRAMUSCULAR | Status: DC
Start: 2019-08-25 — End: 2019-08-27
  Administered 2019-08-26: 0 mL
  Administered 2019-08-26: 10 mL
  Administered 2019-08-27: 0 mL

## 2019-08-25 MED ORDER — INSULIN GLARGINE (U-100) 100 UNIT/ML (3 ML) SUBCUTANEOUS PEN
50.0000 [IU] | PEN_INJECTOR | Freq: Two times a day (BID) | SUBCUTANEOUS | Status: DC
Start: 2019-08-25 — End: 2019-08-27
  Administered 2019-08-25 – 2019-08-27 (×4): 50 [IU] via SUBCUTANEOUS
  Filled 2019-08-25 (×2): qty 3

## 2019-08-25 MED ORDER — NITROGLYCERIN 50 MG/250 ML (200 MCG/ML) IN 5 % DEXTROSE INTRAVENOUS
5.0000 ug/min | INTRAVENOUS | Status: DC
Start: 2019-08-25 — End: 2019-08-26
  Administered 2019-08-25 – 2019-08-26 (×3): 5 ug/min via INTRAVENOUS
  Administered 2019-08-26 (×2): 10 ug/min via INTRAVENOUS
  Administered 2019-08-26: 15 ug/min via INTRAVENOUS
  Administered 2019-08-26: 0 ug/min via INTRAVENOUS
  Administered 2019-08-26: 5 ug/min via INTRAVENOUS
  Filled 2019-08-25 (×2): qty 250

## 2019-08-25 MED ORDER — INSULIN ASPART 100 UNIT/ML INJECTION CORRECTIONAL - CCMC
2.0000 [IU] | PEN_INJECTOR | Freq: Four times a day (QID) | SUBCUTANEOUS | Status: DC
Start: 2019-08-25 — End: 2019-08-27
  Administered 2019-08-25: 7 [IU] via SUBCUTANEOUS
  Administered 2019-08-26: 4 [IU] via SUBCUTANEOUS
  Administered 2019-08-26 (×2): 0 [IU] via SUBCUTANEOUS
  Administered 2019-08-26: 7 [IU] via SUBCUTANEOUS
  Administered 2019-08-27: 08:00:00 10 [IU] via SUBCUTANEOUS
  Filled 2019-08-25: qty 300

## 2019-08-25 MED ORDER — HEPARIN (PORCINE) 25,000 UNIT/250 ML IN 0.45 % SODIUM CHLORIDE IV SOLN
12.0000 [IU]/kg/h | INTRAVENOUS | Status: DC
Start: 2019-08-25 — End: 2019-08-26
  Administered 2019-08-25 – 2019-08-26 (×5): 12 [IU]/kg/h via INTRAVENOUS
  Administered 2019-08-26: 16 [IU]/kg/h via INTRAVENOUS
  Administered 2019-08-26: 0 [IU]/kg/h via INTRAVENOUS
  Administered 2019-08-26: 16 [IU]/kg/h via INTRAVENOUS
  Filled 2019-08-25 (×3): qty 250

## 2019-08-25 MED ORDER — SODIUM CHLORIDE 0.9 % (FLUSH) INJECTION SYRINGE
3.0000 mL | INJECTION | INTRAMUSCULAR | Status: DC | PRN
Start: 2019-08-25 — End: 2019-08-27

## 2019-08-25 NOTE — Care Plan (Signed)
Patient admitted from [facility] for chest pain associated with left arm numbness. C/o pain 8/10 that is sharp and throbbing in nature. No associated symptoms. EKG obtained and WNL. Lungs clear. No edema present. 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 Oriented to unit routines. Physician notified via message regarding patient arrival to unit. Heparin gtt remains on @ 55ml/hr. Flossie Buffy, RN  08/25/2019, 17:36

## 2019-08-25 NOTE — H&P (Signed)
[facility]  History and Physical    Max [name]. 49 y.o. male CV15/A   Date of Service: 08/25/2019    Date of Admission:  08/25/2019   PCP:No Pcp Full Code     Attending physician:  Rockne Coons, DO    Chief Complaint:  Chest pain  History of Present Illness: Max [name]. is a 49 y.o., male with a past medical history significant for CAD status post 10 stent, diabetes mellitus type 2 complicated with peripheral neuropathy and foot ulcerations, hyperlipidemia, and chronic heart failure with reduced ejection fraction, CKD stage 3A, history of DVT after foot surgery and was on blood thinner which has been discontinued, is directly admitted from [facility] for chest pain.  Patient stated that he was doing his daily activities yesterday when he started having chest pain on the left side, substernal, pressure-like sensation, radiating to left arm associated with diaphoresis.  Patient denies any fever, chills, cough, headache, nausea, vomiting, abdominal pain, bladder or bowel disturbances.  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  The patient does not smoke, drink, or use any recreational drugs.  Patient has a strong family history for coronary artery disease.    Patient states that while at home his blood pressure was in 672C systolic.  Patient states that he has been adherent with his medication.  Patient blood pressure was any 947-096G systolic when the patient presented to [facility]. Patient's blood sugar was also elevated at 500s at [facility] and he received 18 units of regular insulin.  Troponins were negative.  EKG at [facility] demonstrated sinus rhythm with right bundle branch block and T-wave changes in anterolateral leads concerning for ischemia.  The EKG has not changed since then.  Patient was started on heparin drip.  COVID-19 negative.    Patient has a  right foot ulcer which is superficial on the plantar surface on the base of 1st metatarsal.  Patient is a x-ray at [facility] which did not show any evidence of osteomyelitis.  Ulcer seems to be clean without any purulent discharge.    For his diabetes mellitus type 2, patient takes 70 units Levemir twice daily and 30 units short-acting t.i.d. a.c..    Records from [facility].  Patient had a Lexiscan myocardial perfusion study done on 07/29/2019 which demonstrated evidence suggestive inferior wall non transmural myocardial infarction with a mild-to-moderate amount of peri-infarction ischemia.  Decreased LVEF calculated at 47%.  The patient was clinically negative for ischemia.  The patient was electrocardiographically negative for ischemia. Transthoracic echocardiography done on 07/29/2019 demonstrated LVEF 45%, hypokinesis of basal inferior and basal mid inferior lateral wall, moderately increased thickness of ventricular septum, left atrial dilatation.  Patient's EKGs done there that were similar to the EKG that patient has now.  As per records, patient was off his dual antiplatelet agents when he was evaluated at [facility].  Patient had a cardiac catheterization done on 08/02/2019 which demonstrated multiple stents in proximal portion of left anterior descending which were patent.  Beyond the proximal portion, the anterior LAD has 20% plaque center medial left LAD artery is a small diameter artery and has multiple noncritical packs of 20-30% narrowing.  There is 1 good-sized diagonal branch with no significant disease.  The circumflex artery is large non dominant and gives rise to 1 good size ups to use marginal branch.  It  has no significant disease.  The right coronary artery is large dominant and gives rise to PDA and PLV branches.  It has less than 20% plaques throughout the course.    Patient also had a CTA done on 07/29/2019 which not show any evidence of pulmonary embolism.  Bilateral lower extremity  arterial duplex demonstrated greater than 75% stenosis of proximal right superficial femoral artery, less than 50% stenosis of right below-knee popliteal artery, greater than 75% stenosis of proximal anterior tibial artery, less than 50% stenosis of left below knee popliteal artery, greater than 75% stenosis of proximal left anterior tibial artery, intermittent flow in left posterior tibial artery with occlusion at mid and greater than or equal to 50% stenosis noted distally.  His ABI is normal bilaterally, toe indices suggest small vessel disease.    Of note, there is documentation in [facility] record that patient has some pain medication seeking tendencies.      Review of systems:  Negative except as noted by pertinent positives indicated in the history of present illness above.     Physical Exam:  Filed Vitals:    08/25/19 1600 08/25/19 1900 08/25/19 2107   BP: 138/83 (!) 149/72    Pulse: 91 89    Resp: 18 19    Temp: 36.4 C (97.6 F) 36.8 C (98.2 F) 36.9 C (98.4 F)   SpO2: 99% 97%      General: This is a 49 y.o. male, appears to be in overall fair state of health, in mild distress  Neurological: patient alert and oriented X 3, no focal neurological deficits are noted as tested   Respiratory: B/L Chest clear  Cardiovascular: Normal rate and regular rhythm, S1-S2 present, no murmur, rubs, or gallop heard  Abdomen: Soft, non-tender, non distended, BS present on all 4 quadrants  Peripheral Vascular: no clubbing or cyanosis noted, + 2 pedal and radial pulses, 1+ peripheral edema  Skin: warm, dry, clean, intact; no new rashes, lesions, or ulcerations  Psychiatric:  Anxious    Past Medical History:    Past Medical History:   Diagnosis Date    Arthropathy     CKD (chronic kidney disease), stage III 08/25/2019    Congestive heart failure (CMS HCC)     Coronary artery disease     Diabetes mellitus, type 2 (CMS HCC)     Headache     HTN (hypertension)               Past Surgical History:    Past Surgical History:    Procedure Laterality Date    AMPUTATION FOOT / TOE Left     CORONARY ARTERY ANGIOPLASTY      HX CATARACT REMOVAL Bilateral               Allergies:    Allergies   Allergen Reactions    Ultram [Tramadol] Rash    Benadryl [Diphenhydramine]  Other Adverse Reaction (Add comment)     irritable    Penicillin G Benethamine           Family History  Family Medical History:       Problem Relation (Age of Onset)    Cancer Father    Coronary Artery Disease Mother, Father              Social History  Social History     Tobacco Use    Smoking status: Never Smoker    Smokeless tobacco: Never Used   Media planner  Vaping Use: Never used   Substance Use Topics    Alcohol use: Never    Drug use: Never          CBC Results Differential Results   Recent Labs     08/25/19  2029   WBC 7.5   HGB 11.0*   HCT 35.6*   PLTCNT 528*      Recent Labs     08/25/19  2029   MONOCYTES 13   PMNABS 4.55   LYMPHSABS 1.74   MONOSABS 0.98   EOSABS 0.13          BMP Results Other Chemistries Results   Recent Labs     08/25/19  2029   SODIUM 135*   POTASSIUM 3.8   CHLORIDE 101   CO2 23   BUN 21   CREATININE 1.61*   GFR 50*   GLUCOSENF 235*        Recent Labs     08/25/19  2029   CALCIUM 9.3   ALBUMIN 3.2*   MAGNESIUM 2.0   PHOSPHORUS 4.0      Liver/Pancreas Enzyme Results ABG results     Recent Labs     08/25/19  2029   AST 15   ALT 10   ALKPHOS 111        No results found for this encounter   Cardiac Results    Coag Results     Recent Labs     08/25/19  2029   TROPONINI 23    No results for input(s): INR, PROTHROMTME, APTT in the last 72 hours.    Invalid input(s): PTT, PT, CREACTPROTIE     Imaging:       Inpatient Medications:  acetaminophen (TYLENOL) tablet, 500 mg, Oral, Q4H PRN  [START ON 08/26/2019] amLODIPine (NORVASC) tablet, 10 mg, Oral, Daily  [START ON 08/26/2019] aspirin chewable tablet 81 mg, 81 mg, Oral, Daily  atorvastatin (LIPITOR) tablet, 80 mg, Oral, QPM  carvedilol (COREG) tablet, 25 mg, Oral, 2x/day-Food  correctional insulin aspart  (NOVOLOG) 100 units/mL injection, 2-12 Units, Subcutaneous, 4x/day AC  gabapentin (NEURONTIN) capsule, 300 mg, Oral, 3x/day  heparin 25,000 units in 0.45% NS 250 mL infusion, 12 Units/kg/hr (Adjusted), Intravenous, Continuous  [START ON 08/26/2019] insulin aspart (NOVOLOG) 100 units/mL SubQ pen, 20 Units, Subcutaneous, 3x/day AC  insulin glargine 100 units/mL SubQ pen, 50 Units, Subcutaneous, 2x/day  [Held by provider] isosorbide mononitrate (IMDUR) 24 hr extended release tablet, 120 mg, Oral, QAM  [START ON 08/26/2019] lisinopril (PRINIVIL) tablet, 2.5 mg, Oral, Daily  magnesium hydroxide (MILK OF MAGNESIA) 400mg  per 28mL oral liquid, 15 mL, Oral, Daily PRN  nitroGLYCERIN 50 mg in 250 ml D5W premix infusion, 5 mcg/min, Intravenous, Continuous  NS flush syringe, 3 mL, Intracatheter, Q8HRS  NS flush syringe, 3 mL, Intracatheter, Q1H PRN  ondansetron (ZOFRAN) 2 mg/mL injection, 4 mg, Intravenous, Q6H PRN  [START ON 08/26/2019] spironolactone (ALDACTONE) tablet, 50 mg, Oral, Daily with Breakfast  [Held by provider] ticagrelor (BRILINTA) tablet, 90 mg, Oral, 2x/day       ______________________________________________________________________  Problem list:  Active Hospital Problems    Diagnosis    Chest pain       Assessment and Plan:    ACS/chest pain rule out  Troponin negative, EKG baseline  On heparin drip  Aspirin given in the outside facility.  Continue aspirin, Lipitor, Coreg.  Cardiology on board.  Since patient is still symptomatic, would start nitro drip.  Cardiology updated and agree with plan.  Stress test  done on 07/29/2019 was unremarkable for any evidence of ischemia.  Transthoracic echocardiography done on 07/29/2019 demonstrated LVEF 45% with hypokinesis of basal inferior and basal mid inferior lateral wall.  Patient has a cardiac catheterization done on 08/02/2019 which demonstrated mild coronary artery disease with patent stents.    CAD s/p stent/Hypertension/hyperlipidemia  Continue amlodipine, aspirin,  Lipitor, Coreg, lisinopril, spironolactone.  Since patient is on nitro drip, will hold Imdur for now.  Hold Effient    Diabetes mellitus type 2, uncontrolled with hyperglycemia in 200s  HbA1c unknown, HbA1c ordered.  Patient takes Levemir 70 units twice daily and short-acting insulin 30 units t.i.d. A.c..  Will start on insulin glargine 50 units twice daily and NovoLog 20 units t.i.d. A.c., and correctional insulin high intensity.    Diabetic neuropathy complicated with diabetic foot ulcer  Ulcer seems to be clean without any purulent discharge.  X-ray at outside facility did not show any evidence of osteomyelitis  Patient does not have any leukocytosis, fever or any signs of sepsis.  Would not recommend antibiotics is present.  Consult Wound Care.    Chronic heart failure with reduced ejection fraction  Echo done on 07/29/2019 demonstrates EF 45%.  Not in exacerbation  Continue management as above.    Diet:  Cardiac diabetic diet, NPO after midnight  DVT prophylaxis:  Heparin  Code Status:  Full  Family: No family present at bedside. Patient has full capacity. Management plan discussed with patient. Adequate amount of time was given to answer all the questions as per their satisfaction.    Jacques Earthly, MD, PGY-3 08/25/2019      Patient was seen examined this a.m..  No acute events overnight.  While on the nitroglycerin drip his blood pressure still in the 366 systolic.  Patient's troponins have been negative EKGs were nonischemic.  Patient had a left heart catheterization a couple weeks back with no acute or significant coronary artery disease and patent stents.  Will stop the nitroglycerin drip put the patient back on his home Imdur added back his Brilinta.  Patient's chest pain may be due to his uncontrolled hypertension for which we will evaluate after he is off of the nitroglycerin drip.

## 2019-08-25 NOTE — Nurses Notes (Signed)
Dr. Everlene Other on floor ask if patient could eat he said yes he had put in diet order.   Enter patient room patient was asleep hugging pillow. Started the [employer], ask patient pain level he said it was still a 8/10 . Will continue to monitor.

## 2019-08-25 NOTE — Nurses Notes (Signed)
Patient arrived from 3N with RN. Patient immeditaley hooked up to bedside monitor. Patientr alert and oreiented.patient with wound on bottom of right foot. Open to air. Patient c/o chest pain 8/10 when arrived to rrom . Notified pharmacy of patients arrival.   Patient ask if he can eat cue to being hunry and not eating, i informed him, i would speak with the doctor.

## 2019-08-25 NOTE — Nurses Notes (Signed)
This nurse has attempted to obtain admission orders from the attending on record and from calling the nursing supervisor and bed placement for assistance without success.  Unit clerk has attempted to help with this matter.  This nurse has been unsuccessful in finding a phycian to to respond regarding this patient or accept this patient for care this shift. Will hand off to next shift. Flossie Buffy, RN  08/25/2019, 18:39

## 2019-08-25 NOTE — Nurses Notes (Signed)
Patient lying on left side higging pillow eyes closed . VS WNL. No signs of pain or distress noted . Call light within reach.

## 2019-08-25 NOTE — Nurses Notes (Signed)
Pt transferred to room CV15 with all personal belongings (hat, sunglasses, cell phone, etc).  Pt still experiencing 8/10 chest pain, given one dose of morphine.  Roberta RN aware of situation and plan is to start Nitro gtts via bedside report.

## 2019-08-26 ENCOUNTER — Inpatient Hospital Stay (HOSPITAL_COMMUNITY): Payer: Medicare Other

## 2019-08-26 DIAGNOSIS — L97519 Non-pressure chronic ulcer of other part of right foot with unspecified severity: Secondary | ICD-10-CM

## 2019-08-26 DIAGNOSIS — R079 Chest pain, unspecified: Secondary | ICD-10-CM

## 2019-08-26 DIAGNOSIS — N183 Chronic kidney disease, stage 3 unspecified (CMS HCC): Secondary | ICD-10-CM

## 2019-08-26 DIAGNOSIS — E11621 Type 2 diabetes mellitus with foot ulcer: Secondary | ICD-10-CM

## 2019-08-26 DIAGNOSIS — I110 Hypertensive heart disease with heart failure: Secondary | ICD-10-CM

## 2019-08-26 DIAGNOSIS — I2510 Atherosclerotic heart disease of native coronary artery without angina pectoris: Secondary | ICD-10-CM

## 2019-08-26 DIAGNOSIS — I5022 Chronic systolic (congestive) heart failure: Secondary | ICD-10-CM

## 2019-08-26 DIAGNOSIS — E1165 Type 2 diabetes mellitus with hyperglycemia: Secondary | ICD-10-CM

## 2019-08-26 DIAGNOSIS — E1140 Type 2 diabetes mellitus with diabetic neuropathy, unspecified: Secondary | ICD-10-CM

## 2019-08-26 DIAGNOSIS — E785 Hyperlipidemia, unspecified: Secondary | ICD-10-CM

## 2019-08-26 DIAGNOSIS — I255 Ischemic cardiomyopathy: Secondary | ICD-10-CM

## 2019-08-26 DIAGNOSIS — I10 Essential (primary) hypertension: Secondary | ICD-10-CM

## 2019-08-26 LAB — COMPREHENSIVE METABOLIC PANEL, NON-FASTING
ALBUMIN: 2.9 g/dL — ABNORMAL LOW (ref 3.5–5.0)
ALKALINE PHOSPHATASE: 98 U/L (ref 45–115)
ALT (SGPT): 9 U/L — ABNORMAL LOW (ref 10–55)
ANION GAP: 7 mmol/L (ref 4–13)
AST (SGOT): 13 U/L (ref 8–45)
BILIRUBIN TOTAL: 0.3 mg/dL (ref 0.3–1.3)
BUN/CREA RATIO: 13 (ref 6–22)
BUN: 23 mg/dL (ref 8–25)
CALCIUM: 9 mg/dL (ref 8.5–10.0)
CHLORIDE: 103 mmol/L (ref 96–111)
CO2 TOTAL: 23 mmol/L (ref 22–30)
CREATININE: 1.74 mg/dL — ABNORMAL HIGH (ref 0.75–1.35)
ESTIMATED GFR: 46 mL/min/BSA — ABNORMAL LOW (ref >=60)
GLUCOSE: 326 mg/dL — ABNORMAL HIGH (ref 65–125)
POTASSIUM: 4.5 mmol/L (ref 3.5–5.1)
PROTEIN TOTAL: 7.3 g/dL (ref 6.4–8.3)
SODIUM: 133 mmol/L — ABNORMAL LOW (ref 136–145)

## 2019-08-26 LAB — CBC WITH DIFF
BASOPHIL #: <0.1 10*3/uL (ref <=0.20)
BASOPHIL %: 1 %
EOSINOPHIL #: 0.17 10*3/uL (ref <=0.50)
EOSINOPHIL %: 3 %
HCT: 34.1 % — ABNORMAL LOW (ref 38.9–52.0)
HGB: 10.3 g/dL — ABNORMAL LOW (ref 13.4–17.5)
IMMATURE GRANULOCYTE #: <0.1 10*3/uL (ref <0.10)
IMMATURE GRANULOCYTE %: 0 % (ref 0–1)
LYMPHOCYTE #: 1.4 10*3/uL (ref 1.00–4.80)
LYMPHOCYTE %: 22 %
MCH: 27.5 pg (ref 26.0–32.0)
MCHC: 30.2 g/dL — ABNORMAL LOW (ref 31.0–35.5)
MCV: 90.9 fL (ref 78.0–100.0)
MONOCYTE #: 0.82 10*3/uL (ref 0.20–1.10)
MONOCYTE %: 13 %
MPV: 9.9 fL (ref 8.7–12.5)
NEUTROPHIL #: 3.95 10*3/uL (ref 1.50–7.70)
NEUTROPHIL %: 61 %
PLATELETS: 528 10*3/uL — ABNORMAL HIGH (ref 150–400)
RBC: 3.75 10*6/uL — ABNORMAL LOW (ref 4.50–6.10)
RDW-CV: 16.1 % — ABNORMAL HIGH (ref 11.5–15.5)
WBC: 6.4 10*3/uL (ref 3.7–11.0)

## 2019-08-26 LAB — LIPID PANEL
CHOL/HDL RATIO: 7.7
CHOLESTEROL: 185 mg/dL (ref 100–200)
HDL CHOL: 24 mg/dL — ABNORMAL LOW (ref >=50)
NON-HDL: 161 mg/dL (ref <=190)
TRIGLYCERIDES: 424 mg/dL — ABNORMAL HIGH (ref <150)

## 2019-08-26 LAB — POC BLOOD GLUCOSE (RESULTS)
GLUCOSE, POC: 297 mg/dl (ref 80–130)
GLUCOSE, POC: 203 mg/dl (ref 80–130)
GLUCOSE, POC: 117 mg/dl (ref 80–130)
GLUCOSE, POC: 121 mg/dl (ref 80–130)

## 2019-08-26 LAB — HGA1C (HEMOGLOBIN A1C WITH EST AVG GLUCOSE): HEMOGLOBIN A1C: 11.2 % — ABNORMAL HIGH (ref <5.7)

## 2019-08-26 LAB — URINALYSIS, MACROSCOPIC
BILIRUBIN: NOT DETECTED mg/dL
BLOOD: NOT DETECTED mg/dL
KETONES: NOT DETECTED mg/dL
LEUKOCYTES: NOT DETECTED WBCs/uL
NITRITE: NOT DETECTED
PH: 5 — ABNORMAL LOW (ref 5.0–8.0)
SPECIFIC GRAVITY: 1.016 (ref 1.005–1.030)
UROBILINOGEN: NOT DETECTED mg/dL

## 2019-08-26 LAB — URINALYSIS, MICROSCOPIC

## 2019-08-26 LAB — PTT (PARTIAL THROMBOPLASTIN TIME)
APTT: 34.1 seconds (ref 26.0–39.0)
APTT: 34.6 seconds (ref 26.0–39.0)

## 2019-08-26 LAB — LDL CHOLESTEROL, DIRECT: LDL DIRECT: 103 mg/dL — ABNORMAL HIGH (ref <100)

## 2019-08-26 LAB — MAGNESIUM: MAGNESIUM: 2 mg/dL (ref 1.8–2.6)

## 2019-08-26 LAB — TROPONIN-I
TROPONIN I: 16 ng/L (ref 7–30)
TROPONIN I: 21 ng/L (ref 7–30)

## 2019-08-26 LAB — PHOSPHORUS: PHOSPHORUS: 3.3 mg/dL (ref 2.4–4.7)

## 2019-08-26 MED ORDER — HEPARIN (PORCINE) 5,000 UNIT/ML INJECTION SOLUTION
INTRAMUSCULAR | Status: AC
Start: 2019-08-26 — End: 2019-08-26
  Administered 2019-08-26: 4000 [IU] via INTRAVENOUS
  Filled 2019-08-26: qty 1

## 2019-08-26 MED ORDER — HEPARIN (PORCINE) 5,000 UNITS/ML BOLUS FOR DOSE ADJUSTMENT
50.0000 [IU]/kg | Freq: Once | INTRAMUSCULAR | Status: AC
Start: 2019-08-26 — End: 2019-08-26

## 2019-08-26 MED ORDER — ACETAMINOPHEN 1,000 MG/100 ML (10 MG/ML) INTRAVENOUS SOLUTION
1000.0000 mg | Freq: Four times a day (QID) | INTRAVENOUS | Status: AC | PRN
Start: 2019-08-26 — End: 2019-08-27
  Administered 2019-08-27: 0 mg via INTRAVENOUS
  Administered 2019-08-27: 1000 mg via INTRAVENOUS
  Filled 2019-08-26: qty 100

## 2019-08-26 MED ORDER — LISINOPRIL 5 MG TABLET
5.0000 mg | ORAL_TABLET | Freq: Every day | ORAL | Status: DC
Start: 2019-08-27 — End: 2019-08-27
  Administered 2019-08-27: 5 mg via ORAL
  Filled 2019-08-26: qty 1

## 2019-08-26 MED ORDER — ACETAMINOPHEN 1,000 MG/100 ML (10 MG/ML) INTRAVENOUS SOLUTION
1000.0000 mg | Freq: Four times a day (QID) | INTRAVENOUS | Status: DC | PRN
Start: 2019-08-26 — End: 2019-08-26
  Administered 2019-08-26: 0 mg via INTRAVENOUS
  Administered 2019-08-26: 1000 mg via INTRAVENOUS
  Filled 2019-08-26: qty 100

## 2019-08-26 MED ORDER — LISINOPRIL 5 MG TABLET
5.0000 mg | ORAL_TABLET | Freq: Every day | ORAL | 0 refills | Status: DC
Start: 2019-08-27 — End: 2021-02-28

## 2019-08-26 MED ORDER — RANOLAZINE ER 500 MG TABLET,EXTENDED RELEASE,12 HR
500.0000 mg | ORAL_TABLET | Freq: Two times a day (BID) | ORAL | 0 refills | Status: AC
Start: 2019-08-26 — End: 2019-09-25

## 2019-08-26 MED ORDER — BACLOFEN 10 MG TABLET
10.0000 mg | ORAL_TABLET | Freq: Once | ORAL | Status: AC
Start: 2019-08-26 — End: 2019-08-26
  Administered 2019-08-26: 10 mg via ORAL
  Filled 2019-08-26: qty 1

## 2019-08-26 MED ORDER — RANOLAZINE ER 500 MG TABLET,EXTENDED RELEASE,12 HR
500.0000 mg | ORAL_TABLET | Freq: Two times a day (BID) | ORAL | Status: DC
Start: 2019-08-26 — End: 2019-08-27
  Administered 2019-08-26 – 2019-08-27 (×2): 500 mg via ORAL
  Filled 2019-08-26 (×2): qty 1

## 2019-08-26 NOTE — Care Management Notes (Addendum)
Received a call from patient's nurse regarding d/c transportation. Discussed different transportation options at this time.

## 2019-08-26 NOTE — Care Management Notes (Signed)
Patient does not have a PCP listed on their chart.  Patient was sleeping. Will follow-up later.    Debroah Baller  08/26/2019, 11:47

## 2019-08-26 NOTE — Nurses Notes (Signed)
Woke patient at 0855 for morning meds.  Patient complaining of chest pain 9/10 with leg pain.  Upped Nitro to 15 for pain and blood pressure control.  Contacted physician for diet orders.  Ofirmev ordered for pain.  Safety maintained. Add [place] A. Deneise Lever, RN  08/26/2019, 09:22

## 2019-08-26 NOTE — Consults (Addendum)
[facility]        Cardiology Consult Note      Name: Max [name].                       Date of Birth: 1970-01-29   MRN:  [id number]                         Date of visit: 08/25/2019     PCP: No Pcp     RFC:  Chest pain    HOPI:  Max [name]. is a 49 y.o. year old male  who was transferred here from [facility] with complaints of chest pain.    His cardiovascular history significant multiple PCI with at least 10 stents placed in [place], [place] with history of 2 mg.  Most recent cardiac catheterization a month ago was at [facility] which showed nonobstructive disease with widely patent stents.  Stress test prior to showed inferior infarct with peri-infarct ischemia.  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    He presented to [facility] with complaints of chest pain and elevated blood pressure.  His blood sugars were elevated in 500s.  He has been ruled out for acute MI with negative cardiac markers.  EKG showed chronic ischemic changes which are unchanged.  COVID-19 has been negative.  Recent CT a month ago showed no evidence of PE.  Lower extremity arterial duplex showed severe PVD.  ABI bilaterally within normal limits.  Patient also reported has history of seeking pain medication.    EKG showed sinus rhythm, right bundle branch block, ST T-wave changes the lateral leads.  Troponin are negative.  Creatinine 1.74 with a GFR of 46. Chest x-ray showed no evidence of pulmonary edema.  Triglyceride 420. Direct LDL 103. A1c 11.2      She initially presented to ER locally with complaints of high blood pressure.  In the ER the blood pressure was 179/114.  Transfer request was sent to multiple hospitals and none of them have any beds and  hence he presented to [facility].    I reviewed all the records from recent hospitalization [facility].  He was recommended medical therapy.    Since arrival, his blood pressure is well controlled mostly in the 629U to 765 systolic he has been ruled out for acute MI. He currently denies any shortness of breath.  He still complaints of chest pain describes as pressure down his left arm.  He had similar symptoms a month ago, the only difference is he feels more fatigued.  He is also requesting pain medications to the nurse    Past Medical History:   Diagnosis Date   . Arthropathy    . CKD (chronic kidney disease), stage III 08/25/2019   . Congestive heart failure (CMS HCC)    . Coronary artery disease    . Diabetes mellitus, type 2 (CMS HCC)    . Headache    . HTN (hypertension)            Past Surgical History:   Procedure Laterality Date   . AMPUTATION FOOT / TOE Left    . CORONARY ARTERY ANGIOPLASTY     . HX CATARACT REMOVAL Bilateral  Current Facility-Administered Medications   Medication Dose Route Frequency   . acetaminophen (OFIRMEV) 10 mg/mL IV 1,000 mg  1,000 mg Intravenous Q6H PRN   . acetaminophen (TYLENOL) tablet  500 mg Oral Q4H PRN   . amLODIPine (NORVASC) tablet  10 mg Oral Daily   . aspirin chewable tablet 81 mg  81 mg Oral Daily   . atorvastatin (LIPITOR) tablet  80 mg Oral QPM   . carvedilol (COREG) tablet  25 mg Oral 2x/day-Food   . correctional insulin aspart (NOVOLOG) 100 units/mL injection  2-12 Units Subcutaneous 4x/day AC   . gabapentin (NEURONTIN) capsule  300 mg Oral 3x/day   . insulin aspart (NOVOLOG) 100 units/mL SubQ pen  20 Units Subcutaneous 3x/day AC   . insulin glargine 100 units/mL SubQ pen  50 Units Subcutaneous 2x/day   . isosorbide mononitrate (IMDUR) 24 hr extended release tablet  120 mg Oral QAM   . lisinopril (PRINIVIL) tablet  2.5 mg Oral Daily   . magnesium hydroxide (MILK OF MAGNESIA) 400mg  per 69mL oral liquid  15 mL Oral Daily PRN   . NS flush  syringe  3 mL Intracatheter Q8HRS   . NS flush syringe  3 mL Intracatheter Q1H PRN   . ondansetron (ZOFRAN) 2 mg/mL injection  4 mg Intravenous Q6H PRN   . spironolactone (ALDACTONE) tablet  50 mg Oral Daily with Breakfast   . ticagrelor (BRILINTA) tablet  90 mg Oral 2x/day       Allergies   Allergen Reactions   . Ultram [Tramadol] Rash   . Benadryl [Diphenhydramine]  Other Adverse Reaction (Add comment)     irritable   . Penicillin G Benethamine        Family Medical History:     Problem Relation (Age of Onset)    Cancer Father    Coronary Artery Disease Mother, Father              Social History     Tobacco Use   . Smoking status: Never Smoker   . Smokeless tobacco: Never Used   Substance Use Topics   . Alcohol use: Never        REVIEW OF SYSTEMS:  Constitutional: No fevers, chills, malaise or abnormal weight loss.   HEENT: No issues  Respiratory: No cough, shortness of breath  Cardiovascular: No chest painpalpitations, lower extremity edema.    Gastrointestinal: No abdominal pain, nausea, vomiting  Musculoskeletal: No weakness   Hematological: No issues  Genitourinary: No complaints  Skin: No rashes.  Neurological: Noweakness or loss of sensation  All other systems reviewed and are negative except as noted in HOPI.    PHYSICAL EXAMINATION:     Vitals reviewed. BP 128/74   Pulse 79   Temp 36.8 C (98.3 F)   Resp 16   Ht 1.727 m (5\' 8" )   Wt 99.3 kg (219 lb)   SpO2 96%   BMI 33.30 kg/m       Vitals Filed       08/26/2019  1200 08/26/2019  1300 08/26/2019  1400 08/26/2019  1500 08/26/2019  1600    Pulse: 77 86 79 78 79    Temp: 36.8 C (98.3 F) - - - -    Resp: 14 15 15 13 16           Constitutional: He is oriented to person, place, and time.  He appears well-developed and well-nourished.   HEENT:  Normocephalic, atraumatic.    Neck: Supple. Thyroid normal without enlargement.  Trachea midline.  Cardiovascular: Normal rate and rhythm without murmurs, rubs or gallops.  No JVD.  No bruits.  No peripheral edema is  noted.  Pulmonary/Chest: Lungs clear without wheezes, rales or rhonchi.    Abdominal: Soft. Nontender, nondistended ,no organomegaly.  Musculoskeletal: No weakness.  Neurological: Alert and oriented x 4.  Cranial nerves 2-12 intact without defecits.  BUE's/BLE's.  Strength and sensation intact in all extremeties.   Skin: Skin is warm and dry.   Psychiatric:  Normal mood and affect. Speech is normal and behavior is normal. Judgment and thought content normal.   Vascular: Peripheral  pulses well felt.    LABS:     Results for orders placed or performed during the hospital encounter of 08/25/19 (from the past 24 hour(s))   TROPONIN-I every 3 hours with EKG   Result Value Ref Range    TROPONIN I 23 7 - 30 ng/L   TROPONIN-I every 3 hours with EKG   Result Value Ref Range    TROPONIN I 16 7 - 30 ng/L   LIPID PANEL   Result Value Ref Range    CHOLESTEROL  185 100 - 200 mg/dL    HDL CHOL 24 (L) >=50 mg/dL    TRIGLYCERIDES 424 (H) <150 mg/dL    LDL CALC      VLDL CALC      NON-HDL 161 <=190 mg/dL    CHOL/HDL RATIO 7.7    URINALYSIS, MACROSCOPIC AND MICROSCOPIC    Narrative    The following orders were created for panel order URINALYSIS, MACROSCOPIC AND MICROSCOPIC.  Procedure                               Abnormality         Status                     ---------                               -----------         ------                     URINALYSIS, MACROSCOPIC[[id number]]      Abnormal            Final result               URINALYSIS, MICROSCOPIC[[id number]]      Abnormal            Final result                 Please view results for these tests on the individual orders.   CBC/DIFF    Narrative    The following orders were created for panel order CBC/DIFF.  Procedure                               Abnormality         Status                     ---------                               -----------         ------  CBC WITH KKXF[[id number]]                Abnormal            Final result                 Please  view results for these tests on the individual orders.   COMPREHENSIVE METABOLIC PANEL, NON-FASTING   Result Value Ref Range    SODIUM 135 (L) 136 - 145 mmol/L    POTASSIUM 3.8 3.5 - 5.1 mmol/L    CHLORIDE 101 96 - 111 mmol/L    CO2 TOTAL 23 22 - 30 mmol/L    ANION GAP 11 4 - 13 mmol/L    BUN 21 8 - 25 mg/dL    CREATININE 1.61 (H) 0.75 - 1.35 mg/dL    BUN/CREA RATIO 13 6 - 22    ESTIMATED GFR 50 (L) >=60 mL/min/BSA    ALBUMIN 3.2 (L) 3.5 - 5.0 g/dL     CALCIUM 9.3 8.5 - 10.0 mg/dL    GLUCOSE 235 (H) 65 - 125 mg/dL    ALKALINE PHOSPHATASE 111 45 - 115 U/L    ALT (SGPT) 10 10 - 55 U/L    AST (SGOT)  15 8 - 45 U/L    BILIRUBIN TOTAL 0.3 0.3 - 1.3 mg/dL    PROTEIN TOTAL 8.1 6.4 - 8.3 g/dL   LACTIC ACID LEVEL W/ REFLEX FOR LEVEL >2.0   Result Value Ref Range    LACTIC ACID 1.4 0.5 - 2.2 mmol/L   MAGNESIUM   Result Value Ref Range    MAGNESIUM 2.0 1.8 - 2.6 mg/dL   PHOSPHORUS   Result Value Ref Range    PHOSPHORUS 4.0 2.4 - 4.7 mg/dL   HGA1C (HEMOGLOBIN A1C WITH EST AVG GLUCOSE)   Result Value Ref Range    HEMOGLOBIN A1C 11.2 (H) <5.7 %   URINALYSIS, MACROSCOPIC   Result Value Ref Range    COLOR Yellow Colorless, Straw, Yellow    APPEARANCE Clear Clear    SPECIFIC GRAVITY 1.016 >1.005-<1.030    PH 5.0 (L) >5.0-<8.0    PROTEIN 2+ (A) Not Detected mg/dL    GLUCOSE 3+ (A) Not Detected mg/dL    KETONES Not Detected Not Detected mg/dL    UROBILINOGEN Not Detected Not Detected mg/dL    BILIRUBIN Not Detected Not Detected mg/dL    BLOOD Not Detected Not Detected mg/dL    NITRITE Not Detected Not Detected    LEUKOCYTES Not Detected Not Detected WBCs/uL   URINALYSIS, MICROSCOPIC   Result Value Ref Range    WBCS 0-5 0-5, None /hpf    RBCS 0-5 None, 0-5 /hpf    BACTERIA Trace (A) None /hpf    HYALINE CASTS 11-25 (A) None /lpf   CBC WITH DIFF   Result Value Ref Range    WBC 7.5 3.7 - 11.0 x10^3/uL    RBC 3.95 (L) 4.50 - 6.10 x10^6/uL    HGB 11.0 (L) 13.4 - 17.5 g/dL    HCT 35.6 (L) 38.9 - 52.0 %    MCV 90.1 78.0 - 100.0 fL    MCH  27.8 26.0 - 32.0 pg    MCHC 30.9 (L) 31.0 - 35.5 g/dL    RDW-CV 15.9 (H) 11.5 - 15.5 %    PLATELETS 528 (H) 150 - 400 x10^3/uL    MPV 9.5 8.7 - 12.5 fL    NEUTROPHIL % 61 %    LYMPHOCYTE % 23 %  MONOCYTE % 13 %    EOSINOPHIL % 2 %    BASOPHIL % 1 %    NEUTROPHIL # 4.55 1.50 - 7.70 x10^3/uL    LYMPHOCYTE # 1.74 1.00 - 4.80 x10^3/uL    MONOCYTE # 0.98 0.20 - 1.10 x10^3/uL    EOSINOPHIL # 0.13 <=0.50 x10^3/uL    BASOPHIL # <0.10 <=0.20 x10^3/uL    IMMATURE GRANULOCYTE % 0 0 - 1 %    IMMATURE GRANULOCYTE # <0.10 <0.10 x10^3/uL   PTT (PARTIAL THROMBOPLASTIN TIME) - ONCE   Result Value Ref Range    APTT 34.1 26.0 - 39.0 seconds   TROPONIN-I every 3 hours with EKG   Result Value Ref Range    TROPONIN I 21 7 - 30 ng/L   CBC/DIFF    Narrative    The following orders were created for panel order CBC/DIFF.  Procedure                               Abnormality         Status                     ---------                               -----------         ------                     CBC WITH NKNL[[id number]]                Abnormal            Final result                 Please view results for these tests on the individual orders.   COMPREHENSIVE METABOLIC PANEL, NON-FASTING   Result Value Ref Range    SODIUM 133 (L) 136 - 145 mmol/L    POTASSIUM 4.5 3.5 - 5.1 mmol/L    CHLORIDE 103 96 - 111 mmol/L    CO2 TOTAL 23 22 - 30 mmol/L    ANION GAP 7 4 - 13 mmol/L    BUN 23 8 - 25 mg/dL    CREATININE 1.74 (H) 0.75 - 1.35 mg/dL    BUN/CREA RATIO 13 6 - 22    ESTIMATED GFR 46 (L) >=60 mL/min/BSA    ALBUMIN 2.9 (L) 3.5 - 5.0 g/dL     CALCIUM 9.0 8.5 - 10.0 mg/dL    GLUCOSE 326 (H) 65 - 125 mg/dL    ALKALINE PHOSPHATASE 98 45 - 115 U/L    ALT (SGPT) 9 (L) 10 - 55 U/L    AST (SGOT)  13 8 - 45 U/L    BILIRUBIN TOTAL 0.3 0.3 - 1.3 mg/dL    PROTEIN TOTAL 7.3 6.4 - 8.3 g/dL   MAGNESIUM   Result Value Ref Range    MAGNESIUM 2.0 1.8 - 2.6 mg/dL   PHOSPHORUS   Result Value Ref Range    PHOSPHORUS 3.3 2.4 - 4.7 mg/dL   CBC WITH DIFF   Result Value  Ref Range    WBC 6.4 3.7 - 11.0 x10^3/uL    RBC 3.75 (L) 4.50 - 6.10 x10^6/uL    HGB 10.3 (L) 13.4 - 17.5 g/dL    HCT 34.1 (L) 38.9 - 52.0 %  MCV 90.9 78.0 - 100.0 fL    MCH 27.5 26.0 - 32.0 pg    MCHC 30.2 (L) 31.0 - 35.5 g/dL    RDW-CV 16.1 (H) 11.5 - 15.5 %    PLATELETS 528 (H) 150 - 400 x10^3/uL    MPV 9.9 8.7 - 12.5 fL    NEUTROPHIL % 61 %    LYMPHOCYTE % 22 %    MONOCYTE % 13 %    EOSINOPHIL % 3 %    BASOPHIL % 1 %    NEUTROPHIL # 3.95 1.50 - 7.70 x10^3/uL    LYMPHOCYTE # 1.40 1.00 - 4.80 x10^3/uL    MONOCYTE # 0.82 0.20 - 1.10 x10^3/uL    EOSINOPHIL # 0.17 <=0.50 x10^3/uL    BASOPHIL # <0.10 <=0.20 x10^3/uL    IMMATURE GRANULOCYTE % 0 0 - 1 %    IMMATURE GRANULOCYTE # <0.10 <0.10 x10^3/uL   LDL CHOLESTEROL, DIRECT   Result Value Ref Range    LDL DIRECT 103 (H) <100 mg/dL   PTT (PARTIAL THROMBOPLASTIN TIME) - ONCE   Result Value Ref Range    APTT 34.6 26.0 - 39.0 seconds   POC BLOOD GLUCOSE (RESULTS)   Result Value Ref Range    GLUCOSE, POC 229 Fasting: 80-130 mg/dL; 2 HR PC: <180 mg/dL mg/dl   POC BLOOD GLUCOSE (RESULTS)   Result Value Ref Range    GLUCOSE, POC 228 Fasting: 80-130 mg/dL; 2 HR PC: <180 mg/dL mg/dl   POC BLOOD GLUCOSE (RESULTS)   Result Value Ref Range    GLUCOSE, POC 252 Fasting: 80-130 mg/dL; 2 HR PC: <180 mg/dL mg/dl   POC BLOOD GLUCOSE (RESULTS)   Result Value Ref Range    GLUCOSE, POC 297 Fasting: 80-130 mg/dL; 2 HR PC: <180 mg/dL mg/dl   POC BLOOD GLUCOSE (RESULTS)   Result Value Ref Range    GLUCOSE, POC 203 Fasting: 80-130 mg/dL; 2 HR PC: <180 mg/dL mg/dl       Labs:     Lab Results   Component Value Date    HA1C 11.2 (H) 08/25/2019    LDLCHOL  08/26/2019      Comment:      CALCULATION FOR LDL/VLDL-CHOLESTEROL IS INACCURATE WHEN  TRIGLYCERIDE RESULT IS GREATER THAN 400 mg/dL  <100 mg/dL, Optimal  100-129 mg/dL, Near/Above Optimal  130-159 mg/dL, Borderline High  160-189 mg/dL, High  >=190 mg/dL, Very high    TRIG 424 (H) 08/26/2019    HDLCHOL 24 (L) 08/26/2019    CHOLESTEROL 185  08/26/2019      Lab Results   Component Value Date    SODIUM 133 (L) 08/26/2019    POTASSIUM 4.5 08/26/2019    CHLORIDE 103 08/26/2019    CO2 23 08/26/2019    BUN 23 08/26/2019    CREATININE 1.74 (H) 08/26/2019    GFR 46 (L) 08/26/2019    CALCIUM 9.0 08/26/2019         IMAGING:    XR AP MOBILE CHEST   Final Result      1. Mild cardiomegaly without evidence of acute cardiopulmonary disease.            Radiologist location ID: [id number]               ASSESSMENT:  1. Stable angina  2. CKD stage 3  3. Ischemic cardiomyopathy  4. Hypertension  5. Hyperlipidemia       PLAN:  He is on maximally tolerated antianginal therapy with maximum dose of Norvasc, beta-blocker,  long-acting night  Recommend adding Ranexa 500 b.i.d.  EKG is unchanged from before  Has been ruled out for acute MI with negative cardiac markers  Recent ischemic evaluation with stress test and catheterization showed nonobstructive disease.  He can be discharged home and follow up with his local cardiologist  Increase lisinopril to 5 mg daily  Aggressive control of diabetes  No indication for coronary angiogram at this time since he has underlying chronic kidney disease and no evidence of acute MI    Thank you very much for involving me in the care of this patient.  Please feel free to call me if I can be of any further assistance.    Electronically signed by     Lubertha Basque, MD    08/26/2019,   16:52      This note may have been partially generated using MModal Fluency Direct system, and there may be some incorrect words, spellings, and punctuation that were not noted in checking the note before saving, though effort was made to avoid such errors.

## 2019-08-26 NOTE — Care Plan (Signed)
Problem: Skin Injury Risk Increased  Goal: Skin Health and Integrity  Outcome: Ongoing (see interventions/notes)

## 2019-08-26 NOTE — Discharge Summary (Signed)
[facility]  DISCHARGE SUMMARY      PATIENT NAME:  Max [name], Max [name].  MRN:  [id number]  DOB:  May 22, 1970    ENCOUNTER DATE:  08/25/2019  INPATIENT ADMISSION DATE: 08/25/2019  DISCHARGE DATE:  08/27/2019    ATTENDING PHYSICIAN: No att. providers found  SERVICE: CCM MEDICINE   PRIMARY CARE PHYSICIAN: No Pcp     Reason for Admission       Diagnosis        Chest pain [125822]            DISCHARGE DIAGNOSIS:     Principal Problem:  Chronic Angina    Active Hospital Problems    Diagnosis Date Noted    Chest pain [R07.9] 08/25/2019      Resolved Hospital Problems   No resolved problems to display.     There are no active non-hospital problems to display for this patient.     Allergies   Allergen Reactions    Ultram [Tramadol] Rash    Benadryl [Diphenhydramine]  Other Adverse Reaction (Add comment)     irritable    Penicillin G Benethamine             DISCHARGE MEDICATIONS:     Current Discharge Medication List        START taking these medications.        Details   ranolazine 500 mg Tablet Sustained Release 12 hr  Commonly known as: RANEXA   500 mg, Oral, 2 TIMES DAILY  Qty: 60 Tablet  Refills: 0            CONTINUE these medications which have CHANGED during your visit.        Details   lisinopriL 5 mg Tablet  Commonly known as: PRINIVIL  What changed:   medication strength  how much to take   5 mg, Oral, DAILY  Qty: 30 Tablet  Refills: 0            CONTINUE these medications - NO CHANGES were made during your visit.        Details   amLODIPine 10 mg Tablet  Commonly known as: NORVASC   10 mg, Oral, DAILY  Refills: 0     aspirin 81 mg Tablet, Delayed Release (E.C.)  Commonly known as: ECOTRIN   81 mg, Oral, DAILY  Refills: 0     atorvastatin 80 mg Tablet  Commonly known as: LIPITOR   80 mg, Oral, EVERY EVENING  Refills: 0     carvediloL 25 mg Tablet  Commonly known as: COREG   25 mg, Oral, 2 TIMES DAILY WITH FOOD  Refills: 0     gabapentin 300 mg Capsule  Commonly known as: NEURONTIN   300 mg, Oral, 3 TIMES  DAILY  Refills: 0     isosorbide mononitrate 60 mg Tablet Sustained Release 24 hr  Commonly known as: IMDUR   120 mg, Oral, EVERY MORNING  Refills: 0     spironolactone 50 mg Tablet  Commonly known as: ALDACTONE   50 mg, Oral, EVERY MORNING WITH BREAKFAST  Refills: 0     ticagrelor 90 mg Tablet  Commonly known as: BRILINTA   90 mg, Oral, 2 TIMES DAILY  Refills: 0            Discharge med list refreshed?  YES        DISCHARGE INSTRUCTIONS:   Follow-up Information       Lulu Riding, MD. Go on 09/02/2019.  Specialty: EXTERNAL  Why: @ 2:15PM  WITH DR.[email or web address] , [facility] follow-up for chest pain, patient has been started on Ranexa 500 mg and increase dose of lisinopril to 5 mg   Contact information:  [address]  CARDIOLOGY DEPT  [place] [id number]  [phone]               Anselmo Rod, MD On 08/29/2019.    Specialty: EXTERNAL  Why: @ 3:00PM [facility] follow-up for his chest pain.  ACS has been ruled out. He has been started on her Ranexa and increased his dosage of lisinopril to 5 mg  Contact information:  [address]  [place] [address]  [place] have been started on a new medication her Ranexa 500 mg to be taken twice daily to help with your chest pain.  Your dose of lisinopril has been increased to 5 mg once daily.   Please continue to take all your home medications as before.  Please follow-up with the cardiologist in 1 week and your primary care physician in 3 days.          REASON FOR HOSPITALIZATION AND HOSPITAL COURSE:  Max [name]. is a 49 y.o., male with a past medical history significant for CAD status post 10 stent, diabetes mellitus type 2 complicated with peripheral neuropathy and foot ulcerations, hyperlipidemia, and chronic heart failure with reduced ejection fraction, CKD stage 3A, history of DVT after foot surgery and was on blood thinner which has been discontinued, is  directly admitted from [facility] for chest pain.  Patient presented with stable anginal chest pain  Patient denies any fever, chills, cough, headache, nausea, vomiting, abdominal pain, bladder or bowel disturbances.  Patient has a significant past cardiac history with 2 myocardial infarctions in the past and 10 stents placed in [facility].  His recent ischemic evaluation at [facility] with stress test and cardiac catheterization showed nonobstructive disease. Cardiology was consulted. Patient has been ruled for acute MI with negative cardiac markers.  He is on maximal antianginal therapy with Norvasc, Coreg and Imdur.  He was also started on Ranexa 500 b.i.d. and increase dosage of lisinopril to 5 mg daily by cardiology.    Physical exam  General: This is a 49 y.o. male, appears to be in overall fair state of health, in mild distress  Neurological: patient alert and oriented X 3, no focal neurological deficits are noted as tested   Respiratory: B/L Chest clear  Cardiovascular: Normal rate and regular rhythm, S1-S2 present, no murmur, rubs, or gallop heard  Abdomen: Soft, non-tender, non distended, BS present on all 4 quadrants  Peripheral Vascular: no clubbing or cyanosis noted, + 2 pedal and radial pulses, 1+ peripheral edema  Skin: warm, dry, clean, intact; no new rashes, lesions, or ulcerations  Psychiatric:  normal affect    CONDITION ON DISCHARGE:  A. Ambulation: Full ambulation  B. Self-care Ability: Complete  C. Cognitive Status Alert and Oriented x 3  D. DNR status at discharge: Full Code  E. Lace + Score: 28 (08/27/19 0531)  Advance Directive Information        Most Recent Value   Does the Patient have an Advance Directive? No, Information Offered and Refused            DISCHARGE DISPOSITION:    DISCHARGE PATIENT   Ordered at: 08/26/19 1835     Is there a planned readmission to acute care within 30 days?    No     Disposition:    HOME/SELF CARE/WITH FAMILY MEMBER/OTHER          Mikel Cella, MD    Time spent on discharge greater than 30 minutes? Yes      Copies sent to Care Team         Relationship Specialty Notifications Start End    Pcp, No PCP - General   08/25/19             Referring providers can utilize [email or web address] to access their referred [place] patient's information.         .I saw and examined the patient.  I reviewed the resident's note.  I agree with the findings and plan of care as documented in the resident's note.  Any exceptions/additions are edited/noted.    Rockne Coons, DO 08/30/2019, 10:11

## 2019-08-26 NOTE — Nurses Notes (Signed)
Notified Nursing Supervisor that patient has no ride home tonight but that family could be here in AM to pick patient up. He lives in [place] and is Medicare not [place] so personal transportation is his only option to discharge.

## 2019-08-26 NOTE — Care Management Notes (Signed)
[facility] [place] Management Initial Evaluation    Patient Name: Max [name].  Date of Birth: 1970-03-12  Sex: male  Date/Time of Admission: 08/25/2019  4:49 PM  Room/Bed: CV15/A  Payor: MEDICARE / Plan: MEDICARE PART A AND B / Product Type: Medicare /   PCP: No Pcp    Pharmacy Info:   Preferred Pharmacy       None          Emergency Contact Info:   Extended Emergency Contact Information  Primary Emergency Contact: Beene,Cynthia  Address: [place], [place]  Mobile Phone: [phone]  Relation: Friend    History:   Max [name]. is a 49 y.o., male, admitted inpt    Height/Weight: 172.7 cm (5\' 8" ) / 99.3 kg (219 lb)     LOS: 1 day   Admitting Diagnosis: Chest pain [R07.9]    Assessment:      08/26/19 1435   Assessment Details   Assessment Type Admission   Care Management Plan   Discharge Planning Status initial meeting   Discharge Needs Assessment   Equipment Currently Used at Home walker, standard   Equipment Needed After Discharge none;other (see comments)   Discharge Facility/Level of Care Needs Home with Home Health (code 6)   Transportation Available car   Referral Information   Admission Type inpatient   Arrived From acute hospital, other   ADVANCE DIRECTIVES   Does the Patient have an Advance Directive? No, Information Offered and Refused   Living Environment   Lives With spouse   Living Arrangements house   Able to Return to Prior Arrangements yes       Discharge Plan:  Home with [facility] (code 6)  Pt lives at home with family. Pt has home health out of [place] that comes 2 times a week for dressing change. Family changes rest of week. Pt independent with adls, drives and has walker. Pt asking for oxygen or breathing meds at home. Oxygen sat 98 on room air.     The patient will continue to be evaluated for developing discharge needs.     Case Manager: Claiborne Billings, CASE MANAGER  [phone]

## 2019-08-26 NOTE — Nurses Notes (Signed)
Report received from [place], [place].  Patient sleeping.  No signs of distress or pain.  Safety maintained.  Nyan Dufresne A. Deneise Lever, RN  08/26/2019, 07:15

## 2019-08-27 LAB — ECG 12 LEAD
Atrial Rate: 91 {beats}/min
Atrial Rate: 90 {beats}/min
Calculated P Axis: 36 degrees
Calculated P Axis: 40 degrees
Calculated T Axis: 72 degrees
Calculated T Axis: 49 degrees
PR Interval: 148 ms
PR Interval: 146 ms
QRS Duration: 130 ms
QRS Duration: 132 ms
QT Interval: 442 ms
QT Interval: 434 ms
QTC Calculation: 543 ms
QTC Calculation: 530 ms
Ventricular rate: 91 {beats}/min
Ventricular rate: 90 {beats}/min

## 2019-08-27 LAB — POC BLOOD GLUCOSE (RESULTS): GLUCOSE, POC: 327 mg/dl (ref 80–130)

## 2019-08-27 MED ORDER — BACLOFEN 10 MG TABLET
10.0000 mg | ORAL_TABLET | ORAL | Status: AC
Start: 2019-08-27 — End: 2019-08-27
  Administered 2019-08-27: 10 mg via ORAL
  Filled 2019-08-27: qty 1

## 2019-08-27 NOTE — Nurses Notes (Signed)
Discharge orders placed prior to AM shift starting. Informed that taxi would be provided. Informed pt. Patient discharged home and leaving via taxi.  AVS reviewed with patient/care giver.  A written copy of the AVS and discharge instructions was given to the patient/care giver.  Questions sufficiently answered as needed.  Patient/care giver encouraged to follow up with PCP as indicated.  In the event of an emergency, patient/care giver instructed to call 911 or go to the nearest emergency room.

## 2019-08-27 NOTE — Progress Notes (Signed)
[facility]  History and Physical    Max [name]. 49 y.o. male CV15/A   Date of Service: 08/27/2019    Date of Admission:  08/25/2019   PCP:No Pcp Prior     Attending physician:  No att. providers found    Chief Complaint:  Chest pain  History of Present Illness:  Max [name]. is a 49 y.o., male with a past medical history significant for CAD status post 10 stent, diabetes mellitus type 2 complicated with peripheral neuropathy and foot ulcerations, hyperlipidemia, and chronic heart failure with reduced ejection fraction, CKD stage 3A, history of DVT after foot surgery and was on blood thinner which has been discontinued, is directly admitted from [facility] for chest pain.  Patient presented with stable anginal chest pain  Patient denies any fever, chills, cough, headache, nausea, vomiting, abdominal pain, bladder or bowel disturbances.  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  His recent ischemic evaluation at [facility] with stress test and cardiac catheterization showed nonobstructive disease.   Patient also had a CTA done on 07/29/2019 which not show any evidence of pulmonary embolism.  Bilateral lower extremity arterial duplex demonstrated greater than 75% stenosis of proximal right superficial femoral artery, less than 50% stenosis of right below-knee popliteal artery, greater than 75% stenosis of proximal anterior tibial artery, less than 50% stenosis of left below knee popliteal artery, greater than 75% stenosis of proximal left anterior tibial artery, intermittent flow in left posterior tibial artery with occlusion at mid and greater than or equal to 50% stenosis noted distally.  His ABI is normal bilaterally, toe indices suggest small vessel disease.    Of note, there is documentation in [facility] record that patient has some pain medication seeking tendencies.  Cardiology was  consulted. Patient has been ruled for acute MI with negative cardiac markers.  He is on maximal antianginal therapy with Norvasc, Coreg and Imdur.  He was also started on Ranexa 500 b.i.d. and increase dosage of lisinopril to 5 mg daily by cardiology.        Review of systems:  Negative except as noted by pertinent positives indicated in the history of present illness above.     Physical Exam:  Filed Vitals:    08/26/19 2000 08/26/19 2100 08/27/19 0400 08/27/19 0734   BP:  135/79  (!) 160/91   Pulse:  84  93   Resp:  15  (!) 21   Temp: 36.8 C (98.3 F)  35.1 C (95.1 F) 36.2 C (97.1 F)   SpO2:  95%  98%     General: This is a 49 y.o. male, appears to be in overall fair state of health, in mild distress  Neurological: patient alert and oriented X 3, no focal neurological deficits are noted as tested   Respiratory: B/L Chest clear  Cardiovascular: Normal rate and regular rhythm, S1-S2 present, no murmur, rubs, or gallop heard  Abdomen: Soft, non-tender, non distended, BS present on all 4 quadrants  Peripheral Vascular: no clubbing or cyanosis noted, + 2 pedal and radial pulses, 1+ peripheral edema  Skin: warm, dry, clean, intact; no new rashes, lesions, or ulcerations  Psychiatric:  Anxious    Past Medical History:    Past Medical History:   Diagnosis Date    Arthropathy     CKD (chronic kidney disease), stage III 08/25/2019    Congestive heart failure (  CMS HCC)     Coronary artery disease     Diabetes mellitus, type 2 (CMS HCC)     Headache     HTN (hypertension)               Past Surgical History:    Past Surgical History:   Procedure Laterality Date    AMPUTATION FOOT / TOE Left     CORONARY ARTERY ANGIOPLASTY      HX CATARACT REMOVAL Bilateral               Allergies:    Allergies   Allergen Reactions    Ultram [Tramadol] Rash    Benadryl [Diphenhydramine]  Other Adverse Reaction (Add comment)     irritable    Penicillin G Benethamine           Family History  Family Medical History:       Problem Relation  (Age of Onset)    Cancer Father    Coronary Artery Disease Mother, Father              Social History  Social History     Tobacco Use    Smoking status: Never Smoker    Smokeless tobacco: Never Used   Vaping Use    Vaping Use: Never used   Substance Use Topics    Alcohol use: Never    Drug use: Never          CBC Results Differential Results   Recent Labs     08/25/19  2029 08/26/19  0517   WBC 7.5 6.4   HGB 11.0* 10.3*   HCT 35.6* 34.1*   PLTCNT 528* 528*      Recent Labs     08/25/19  2029 08/26/19  0517   MONOCYTES 13 13   PMNABS 4.55 3.95   LYMPHSABS 1.74 1.40   MONOSABS 0.98 0.82   EOSABS 0.13 0.17          BMP Results Other Chemistries Results   Recent Labs     08/25/19  2029 08/26/19  0517   SODIUM 135* 133*   POTASSIUM 3.8 4.5   CHLORIDE 101 103   CO2 23 23   BUN 21 23   CREATININE 1.61* 1.74*   GFR 50* 46*   GLUCOSENF 235* 326*        Recent Labs     08/25/19  2029 08/26/19  0517   CALCIUM 9.3 9.0   ALBUMIN 3.2* 2.9*   MAGNESIUM 2.0 2.0   PHOSPHORUS 4.0 3.3   HA1C 11.2*  --       Liver/Pancreas Enzyme Results ABG results     Recent Labs     08/26/19  0517   AST 13   ALT 9*   ALKPHOS 98        No results found for this encounter   Cardiac Results    Coag Results     Recent Labs     08/25/19  2029 08/26/19  0131 08/26/19  0517   TROPONINI 23 16 21     Recent Labs     08/26/19  0131 08/26/19  1000   APTT 34.1 34.6        Imaging:       Inpatient Medications:  No current facility-administered medications for this encounter.     ______________________________________________________________________  Problem list:  Active Hospital Problems    Diagnosis    Chest pain       Assessment and Plan:  ACS/chest pain rule out  Troponin negative, EKG baseline  On heparin drip  Aspirin given in the outside facility.  Continue aspirin, Lipitor, Coreg.  Cardiology on board.  Since patient is still symptomatic, would start nitro drip.  Cardiology updated and agree with plan.  Stress test done on 07/29/2019 was unremarkable  for any evidence of ischemia.  Transthoracic echocardiography done on 07/29/2019 demonstrated LVEF 45% with hypokinesis of basal inferior and basal mid inferior lateral wall.  Patient has a cardiac catheterization done on 08/02/2019 which demonstrated mild coronary artery disease with patent stents.  His heparin drip and nitro drip have been discontinued  He has been started on his home blood pressure medication    CAD s/p stent/Hypertension/hyperlipidemia  Continue amlodipine, aspirin, Lipitor, Coreg, lisinopril, spironolactone.  Since patient is on nitro drip, will hold Imdur for now.  Restart Effient    Diabetes mellitus type 2, uncontrolled with hyperglycemia in 200s  HbA1c unknown, HbA1c ordered.  Patient takes Levemir 70 units twice daily and short-acting insulin 30 units t.i.d. A.c..  Continue insulin glargine 50 units twice daily and NovoLog 20 units t.i.d. A.c., and correctional insulin high intensity.    Diabetic neuropathy complicated with diabetic foot ulcer  Ulcer seems to be clean without any purulent discharge.  X-ray at outside facility did not show any evidence of osteomyelitis  Patient does not have any leukocytosis, fever or any signs of sepsis.  Would not recommend antibiotics is present.  Consult Wound Care.    Chronic heart failure with reduced ejection fraction  Echo done on 07/29/2019 demonstrates EF 45%.  Not in exacerbation  Continue management as above.    Diet:  Cardiac diabetic diet, NPO after midnight  DVT prophylaxis:  Heparin  Code Status:  Full  Family: No family present at bedside. Patient has full capacity. Management plan discussed with patient. Adequate amount of time was given to answer all the questions as per their satisfaction.    Mikel Cella, MD   PGY-1 08/27/2019      .I saw and examined the patient.  I reviewed the resident's note.  I agree with the findings and plan of care as documented in the resident's note.  Any exceptions/additions are edited/noted.    Rockne Coons,  DO 08/31/2019, 06:04  Willr estart home medications and await cardiology consult.

## 2019-08-27 NOTE — Care Management Notes (Signed)
Patient does not have a PCP listed on their chart.  Missed patient due to discharge.    Max [name]  08/27/2019, 09:51

## 2019-08-27 NOTE — Care Plan (Signed)
Problem: Skin Injury Risk Increased  Goal: Skin Health and Integrity  Outcome: Ongoing (see interventions/notes)

## 2019-08-27 NOTE — Care Management Notes (Signed)
08/27/2019 0929  CALLED NO ANSWER, NO MESSAGE .

## 2019-09-08 LAB — ECG 12 LEAD
Atrial Rate: 80 {beats}/min
Calculated P Axis: 42 degrees
Calculated T Axis: 103 degrees
PR Interval: 150 ms
QRS Duration: 124 ms
QT Interval: 456 ms
QTC Calculation: 525 ms
Ventricular rate: 80 {beats}/min

## 2020-02-20 ENCOUNTER — Ambulatory Visit (HOSPITAL_COMMUNITY): Payer: Self-pay | Admitting: Surgery

## 2021-02-04 ENCOUNTER — Other Ambulatory Visit (HOSPITAL_COMMUNITY): Payer: Self-pay

## 2021-02-04 LAB — EXTERNAL COVID-19 MOLECULAR RESULT: External 2019-n-CoV/SARS-CoV-2: POSITIVE — AB

## 2021-02-10 HISTORY — PX: LEG AMPUTATION: SHX1105

## 2021-02-23 ENCOUNTER — Other Ambulatory Visit: Payer: Medicare (Managed Care) | Attending: Internal Medicine

## 2021-02-23 ENCOUNTER — Other Ambulatory Visit: Payer: Self-pay

## 2021-02-23 ENCOUNTER — Other Ambulatory Visit (HOSPITAL_COMMUNITY): Payer: Self-pay | Admitting: Internal Medicine

## 2021-02-23 DIAGNOSIS — D649 Anemia, unspecified: Secondary | ICD-10-CM | POA: Insufficient documentation

## 2021-02-23 LAB — BPAM PACKED CELL ORDER
UNIT DIVISION: 0
UNIT DIVISION: 0

## 2021-02-23 LAB — H & H
HCT: 22 % — ABNORMAL LOW (ref 43.5–53.7)
HGB: 7.3 g/dL — ABNORMAL LOW (ref 14.1–18.1)

## 2021-02-23 LAB — TYPE AND CROSS RED CELLS - UNITS

## 2021-02-24 ENCOUNTER — Other Ambulatory Visit (HOSPITAL_COMMUNITY): Payer: Self-pay | Admitting: Family

## 2021-02-24 ENCOUNTER — Encounter (HOSPITAL_COMMUNITY): Payer: Self-pay

## 2021-02-24 ENCOUNTER — Inpatient Hospital Stay
Admission: RE | Admit: 2021-02-24 | Discharge: 2021-02-24 | Disposition: A | Discharge status: Home / Self Care | Payer: Medicare (Managed Care) | Source: Ambulatory Visit | Attending: Internal Medicine | Admitting: Internal Medicine

## 2021-02-24 VITALS — BP 125/66 | HR 94 | Temp 98.3°F | Resp 20

## 2021-02-24 DIAGNOSIS — D649 Anemia, unspecified: Secondary | ICD-10-CM | POA: Insufficient documentation

## 2021-02-24 LAB — BPAM PACKED CELL ORDER

## 2021-02-24 LAB — TYPE AND CROSS RED CELLS - UNITS
ABO/RH(D): O POS
ANTIBODY SCREEN: NEGATIVE
UNITS ORDERED: 1

## 2021-02-24 MED ORDER — HYDROCORTISONE SOD SUCCINATE 100 MG/2 ML VIAL WRAPPER
100.0000 mg | Freq: Once | INTRAMUSCULAR | Status: DC | PRN
Start: 2021-02-24 — End: 2021-02-25

## 2021-02-24 MED ORDER — ACETAMINOPHEN 325 MG TABLET
650.0000 mg | ORAL_TABLET | Freq: Once | ORAL | Status: DC | PRN
Start: 2021-02-24 — End: 2021-02-25

## 2021-02-24 MED ORDER — FUROSEMIDE 10 MG/ML INJECTION SOLUTION
20.0000 mg | Freq: Once | INTRAMUSCULAR | Status: AC
Start: 2021-02-24 — End: 2021-02-24
  Administered 2021-02-24: 20 mg via INTRAVENOUS

## 2021-02-24 MED ORDER — DIPHENHYDRAMINE 25 MG CAPSULE
50.0000 mg | ORAL_CAPSULE | Freq: Once | ORAL | Status: DC | PRN
Start: 2021-02-24 — End: 2021-02-25

## 2021-02-24 MED ORDER — FUROSEMIDE 10 MG/ML INJECTION SOLUTION
INTRAMUSCULAR | Status: AC
Start: 2021-02-24 — End: 2021-02-25
  Filled 2021-02-24: qty 2

## 2021-02-24 MED ORDER — SODIUM CHLORIDE 0.9 % IV BOLUS
40.0000 mL | INJECTION | Freq: Once | Status: AC | PRN
Start: 2021-02-24 — End: 2021-02-24

## 2021-02-24 NOTE — Nurses Notes (Signed)
Pt arrived via wc with [facility] staff. Transferred independently to bed. VS within parameters. Both units of PRBC's infused without problems. Voided 200ccdark urine. Pt discharged via wc accompanied by Corrin Parker MA

## 2021-02-25 ENCOUNTER — Encounter (HOSPITAL_COMMUNITY): Payer: Self-pay

## 2021-02-25 LAB — TYPE AND CROSS RED CELLS - UNITS: UNITS ORDERED: 2

## 2021-02-25 LAB — BPAM PACKED CELL ORDER

## 2021-02-27 ENCOUNTER — Emergency Department (HOSPITAL_COMMUNITY): Payer: Medicare (Managed Care)

## 2021-02-27 ENCOUNTER — Encounter (HOSPITAL_COMMUNITY): Payer: Self-pay

## 2021-02-27 ENCOUNTER — Emergency Department
Admission: EM | Admit: 2021-02-27 | Discharge: 2021-02-28 | Disposition: A | Discharge status: Other Acute Inpatient Hospital | Payer: Medicare (Managed Care) | Source: Ambulatory Visit | Attending: Emergency Medicine | Admitting: Emergency Medicine

## 2021-02-27 ENCOUNTER — Ambulatory Visit (HOSPITAL_COMMUNITY): Payer: Medicare (Managed Care)

## 2021-02-27 ENCOUNTER — Other Ambulatory Visit: Payer: Self-pay

## 2021-02-27 DIAGNOSIS — J189 Pneumonia, unspecified organism: Secondary | ICD-10-CM | POA: Insufficient documentation

## 2021-02-27 DIAGNOSIS — J440 Chronic obstructive pulmonary disease with acute lower respiratory infection: Secondary | ICD-10-CM | POA: Insufficient documentation

## 2021-02-27 DIAGNOSIS — I959 Hypotension, unspecified: Secondary | ICD-10-CM | POA: Insufficient documentation

## 2021-02-27 DIAGNOSIS — D509 Iron deficiency anemia, unspecified: Secondary | ICD-10-CM | POA: Insufficient documentation

## 2021-02-27 DIAGNOSIS — I5022 Chronic systolic (congestive) heart failure: Secondary | ICD-10-CM | POA: Insufficient documentation

## 2021-02-27 DIAGNOSIS — Z8614 Personal history of Methicillin resistant Staphylococcus aureus infection: Secondary | ICD-10-CM | POA: Insufficient documentation

## 2021-02-27 DIAGNOSIS — L0390 Cellulitis, unspecified: Secondary | ICD-10-CM

## 2021-02-27 DIAGNOSIS — R4182 Altered mental status, unspecified: Secondary | ICD-10-CM | POA: Insufficient documentation

## 2021-02-27 DIAGNOSIS — R918 Other nonspecific abnormal finding of lung field: Secondary | ICD-10-CM | POA: Insufficient documentation

## 2021-02-27 DIAGNOSIS — E875 Hyperkalemia: Secondary | ICD-10-CM | POA: Insufficient documentation

## 2021-02-27 DIAGNOSIS — I700 Atherosclerosis of aorta: Secondary | ICD-10-CM | POA: Insufficient documentation

## 2021-02-27 DIAGNOSIS — Z7901 Long term (current) use of anticoagulants: Secondary | ICD-10-CM | POA: Insufficient documentation

## 2021-02-27 DIAGNOSIS — I492 Junctional premature depolarization: Secondary | ICD-10-CM | POA: Insufficient documentation

## 2021-02-27 DIAGNOSIS — I132 Hypertensive heart and chronic kidney disease with heart failure and with stage 5 chronic kidney disease, or end stage renal disease: Secondary | ICD-10-CM | POA: Insufficient documentation

## 2021-02-27 DIAGNOSIS — I255 Ischemic cardiomyopathy: Secondary | ICD-10-CM | POA: Insufficient documentation

## 2021-02-27 DIAGNOSIS — R601 Generalized edema: Secondary | ICD-10-CM | POA: Insufficient documentation

## 2021-02-27 DIAGNOSIS — D649 Anemia, unspecified: Secondary | ICD-10-CM | POA: Insufficient documentation

## 2021-02-27 DIAGNOSIS — N186 End stage renal disease: Secondary | ICD-10-CM | POA: Insufficient documentation

## 2021-02-27 DIAGNOSIS — Z89511 Acquired absence of right leg below knee: Secondary | ICD-10-CM | POA: Insufficient documentation

## 2021-02-27 DIAGNOSIS — Z992 Dependence on renal dialysis: Secondary | ICD-10-CM | POA: Insufficient documentation

## 2021-02-27 DIAGNOSIS — Z20822 Contact with and (suspected) exposure to covid-19: Secondary | ICD-10-CM | POA: Insufficient documentation

## 2021-02-27 DIAGNOSIS — I4510 Unspecified right bundle-branch block: Secondary | ICD-10-CM | POA: Insufficient documentation

## 2021-02-27 DIAGNOSIS — N390 Urinary tract infection, site not specified: Secondary | ICD-10-CM

## 2021-02-27 DIAGNOSIS — I498 Other specified cardiac arrhythmias: Secondary | ICD-10-CM

## 2021-02-27 DIAGNOSIS — Z86718 Personal history of other venous thrombosis and embolism: Secondary | ICD-10-CM | POA: Insufficient documentation

## 2021-02-27 DIAGNOSIS — I2510 Atherosclerotic heart disease of native coronary artery without angina pectoris: Secondary | ICD-10-CM | POA: Insufficient documentation

## 2021-02-27 DIAGNOSIS — J90 Pleural effusion, not elsewhere classified: Secondary | ICD-10-CM | POA: Insufficient documentation

## 2021-02-27 DIAGNOSIS — E1122 Type 2 diabetes mellitus with diabetic chronic kidney disease: Secondary | ICD-10-CM | POA: Insufficient documentation

## 2021-02-27 DIAGNOSIS — I440 Atrioventricular block, first degree: Secondary | ICD-10-CM | POA: Insufficient documentation

## 2021-02-27 DIAGNOSIS — I429 Cardiomyopathy, unspecified: Secondary | ICD-10-CM

## 2021-02-27 LAB — BASIC METABOLIC PANEL
ANION GAP: 15 mmol/L — ABNORMAL HIGH (ref 4–13)
BUN/CREA RATIO: 10 (ref 6–22)
BUN: 50 mg/dL — ABNORMAL HIGH (ref 8–25)
CALCIUM: 8.2 mg/dL — ABNORMAL LOW (ref 8.5–10.0)
CHLORIDE: 97 mmol/L (ref 96–111)
CO2 TOTAL: 18 mmol/L — ABNORMAL LOW (ref 22–30)
CREATININE: 5.25 mg/dL — ABNORMAL HIGH (ref 0.75–1.35)
ESTIMATED GFR: 13 mL/min/BSA — ABNORMAL LOW (ref >=60)
GLUCOSE: 251 mg/dL — ABNORMAL HIGH (ref 65–125)
POTASSIUM: 6.5 mmol/L (ref 3.5–5.1)
SODIUM: 130 mmol/L — ABNORMAL LOW (ref 136–145)

## 2021-02-27 LAB — ARTERIAL BLOOD GAS (TEMP COMP)
(T) PCO2: 20.8 mm/Hg — ABNORMAL LOW (ref 32.0–45.0)
BASE DEFICIT: 4.6 mmol/L — ABNORMAL HIGH (ref <=2.3)
BICARBONATE (ARTERIAL): 19.7 mmol/L — ABNORMAL LOW (ref 20.0–29.0)
O2 SATURATION (ARTERIAL): 97.9 % (ref 94.0–98.0)
PCO2 (ARTERIAL): 33 mm/Hg (ref 32–45)
PH (ARTERIAL): 7.39 (ref 7.34–7.45)
PO2 (ARTERIAL): 120 mm/Hg — ABNORMAL HIGH (ref 75–100)

## 2021-02-27 LAB — URINALYSIS, MACRO/MICRO
GLUCOSE: NEGATIVE mg/dL
LEUKOCYTES: NEGATIVE WBCs/uL
NITRITE: POSITIVE — AB
PH: 5.5 (ref 5.0–8.5)
PROTEIN: 30 mg/dL — AB
SPECIFIC GRAVITY: 1.02 (ref 1.005–1.020)
UROBILINOGEN: 0.2 mg/dL

## 2021-02-27 LAB — SCAN DIFFERENTIAL
PLATELET ESTIMATE: INCREASED
WBC MORPHOLOGY COMMENT: NORMAL

## 2021-02-27 LAB — VENOUS BLOOD GAS
(T) PCO2: 20.8 mm/Hg — ABNORMAL LOW (ref 23.0–29.0)
BASE DEFICIT: 6.9 mmol/L — ABNORMAL HIGH (ref <=2.0)
BICARBONATE (VENOUS): 19.4 mmol/L — ABNORMAL LOW (ref 22.0–26.0)
O2 SATURATION (VENOUS): 30.1 % — ABNORMAL LOW (ref 40.0–85.0)
PCO2 (VENOUS): 43 mm/Hg (ref 41–51)
PH (VENOUS): 7.27 — ABNORMAL LOW (ref 7.31–7.41)
PO2 (VENOUS): 23 mm/Hg — ABNORMAL LOW (ref 35–50)

## 2021-02-27 LAB — URINALYSIS, MICROSCOPIC

## 2021-02-27 LAB — CBC WITH DIFF
BASOPHIL %: 1 % (ref 0–3)
EOSINOPHIL %: 1 % (ref 0–7)
HCT: 22.9 % — ABNORMAL LOW (ref 43.5–53.7)
HGB & HCT check: -0.7
HGB: 7.4 g/dL — ABNORMAL LOW (ref 14.1–18.1)
LYMPHOCYTE %: 24 % (ref 10–50)
MCH: 27.6 pg (ref 27.0–31.2)
MCHC: 32.4 g/dL (ref 31.8–35.4)
MCV: 85.3 fL (ref 80.0–97.0)
MONOCYTE %: 14 % — ABNORMAL HIGH (ref 0–12)
NEUTROPHIL #: 7.2 10*3/uL
NEUTROPHIL %: 60 % (ref 37–80)
PLATELETS: 514 10*3/uL — ABNORMAL HIGH (ref 142–424)
RBC: 2.69 10*6/uL — ABNORMAL LOW (ref 4.69–6.13)
RDW: 16.2 % — ABNORMAL HIGH (ref 11.6–14.9)
WBC: 11.9 10*3/uL — ABNORMAL HIGH (ref 4.6–10.2)

## 2021-02-27 LAB — HEPATIC FUNCTION PANEL
ALBUMIN: 2 g/dL — ABNORMAL LOW (ref 3.5–5.0)
ALKALINE PHOSPHATASE: 211 U/L — ABNORMAL HIGH (ref 45–115)
ALT (SGPT): 10 U/L (ref 10–55)
AST (SGOT): 16 U/L (ref 8–45)
BILIRUBIN DIRECT: 0.4 mg/dL (ref 0.1–0.4)
BILIRUBIN TOTAL: 0.6 mg/dL (ref 0.3–1.3)
PROTEIN TOTAL: 7.5 g/dL (ref 6.4–8.3)

## 2021-02-27 LAB — TYPE AND SCREEN: ANTIBODY SCREEN: NEGATIVE

## 2021-02-27 LAB — PT/INR
INR: 3.9 — ABNORMAL HIGH (ref 0.00–3.50)
PROTHROMBIN TIME: 35.7 seconds — ABNORMAL HIGH (ref 10.0–11.0)

## 2021-02-27 LAB — COVID-19, FLU A/B, RSV RAPID BY PCR
INFLUENZA VIRUS TYPE A: NOT DETECTED
INFLUENZA VIRUS TYPE B: NOT DETECTED
RESPIRATORY SYNCTIAL VIRUS (RSV): NOT DETECTED
SARS-CoV-2: NOT DETECTED

## 2021-02-27 LAB — POC BLOOD GLUCOSE (RESULTS)
GLUCOSE, POC: 255 mg/dl — ABNORMAL HIGH (ref 74–106)
GLUCOSE, POC: 279 mg/dl — ABNORMAL HIGH (ref 74–106)

## 2021-02-27 LAB — ECG 12 LEAD
Calculated P Axis: 47 degrees
Calculated R Axis: 119 degrees
Calculated T Axis: -10 degrees
QT Interval: 482 ms
QTC Calculation: 440 ms
Ventricular rate: 59 {beats}/min

## 2021-02-27 LAB — B-TYPE NATRIURETIC PEPTIDE: BNP: 812 pg/mL — ABNORMAL HIGH (ref <=99)

## 2021-02-27 LAB — LACTIC ACID LEVEL W/ REFLEX FOR LEVEL >2.0: LACTIC ACID: 3.3 mmol/L — ABNORMAL HIGH (ref 0.5–2.2)

## 2021-02-27 LAB — TROPONIN-I: TROPONIN I: 37 ng/L (ref <=30)

## 2021-02-27 MED ORDER — VANCOMYCIN 1.25 GRAM INTRAVENOUS SOLUTION
15.0000 mg/kg | INTRAVENOUS | Status: AC
Start: 2021-02-27 — End: 2021-02-27
  Administered 2021-02-27: 1250 mg via INTRAVENOUS
  Administered 2021-02-27: 0 mg via INTRAVENOUS
  Filled 2021-02-27: qty 12.5

## 2021-02-27 MED ORDER — ALBUTEROL SULFATE 2.5 MG/3 ML (0.083 %) SOLUTION FOR NEBULIZATION
10.0000 mg | INHALATION_SOLUTION | RESPIRATORY_TRACT | Status: AC
Start: 2021-02-27 — End: 2021-02-27
  Administered 2021-02-27: 10 mg via RESPIRATORY_TRACT
  Filled 2021-02-27: qty 3

## 2021-02-27 MED ORDER — ONDANSETRON HCL (PF) 4 MG/2 ML INJECTION SOLUTION
4.0000 mg | INTRAMUSCULAR | Status: AC
Start: 2021-02-28 — End: 2021-02-27
  Administered 2021-02-27: 4 mg via INTRAVENOUS
  Filled 2021-02-27: qty 2

## 2021-02-27 MED ORDER — INSULIN REGULAR HUMAN 100 UNIT/ML INJECTION - CHARGE BY DOSE
10.0000 [IU] | INTRAMUSCULAR | Status: AC
Start: 2021-02-27 — End: 2021-02-27
  Administered 2021-02-27: 10 [IU] via INTRAVENOUS
  Filled 2021-02-27: qty 3

## 2021-02-27 MED ORDER — SODIUM BICARBONATE 8.4 % (1 MEQ/ML) INTRAVENOUS SYRINGE
50.0000 meq | INJECTION | INTRAVENOUS | Status: AC
Start: 2021-02-27 — End: 2021-02-27
  Administered 2021-02-27: 50 meq via INTRAVENOUS

## 2021-02-27 MED ORDER — ATROPINE 0.1 MG/ML INJECTION SYRINGE
1.0000 mg | INJECTION | INTRAMUSCULAR | Status: AC
Start: 2021-02-27 — End: 2021-02-27
  Administered 2021-02-27: 1 mg via INTRAVENOUS
  Filled 2021-02-27: qty 10

## 2021-02-27 MED ORDER — SODIUM ZIRCONIUM CYCLOSILICATE 10 GRAM ORAL POWDER PACKET
10.0000 g | Freq: Every day | ORAL | Status: DC
Start: 2021-02-28 — End: 2021-02-27

## 2021-02-27 MED ORDER — SODIUM CHLORIDE 0.9 % INTRAVENOUS SOLUTION
5.0000 ug/min | INTRAVENOUS | Status: DC
Start: 2021-02-27 — End: 2021-02-27
  Administered 2021-02-27: 5 ug/min via INTRAVENOUS
  Administered 2021-02-27: 0 ug/min via INTRAVENOUS
  Filled 2021-02-27: qty 1

## 2021-02-27 MED ORDER — SODIUM ZIRCONIUM CYCLOSILICATE 10 GRAM ORAL POWDER PACKET
10.0000 g | ORAL | Status: AC
Start: 2021-02-27 — End: 2021-02-27
  Administered 2021-02-27: 10 g via ORAL
  Filled 2021-02-27: qty 1

## 2021-02-27 MED ORDER — DEXTROSE 50 % IN WATER (D50W) INTRAVENOUS SYRINGE
25.0000 g | INJECTION | INTRAVENOUS | Status: AC
Start: 2021-02-27 — End: 2021-02-27
  Administered 2021-02-27: 50 mL via INTRAVENOUS
  Filled 2021-02-27: qty 50

## 2021-02-27 MED ORDER — SODIUM CHLORIDE 0.9 % IV BOLUS
500.0000 mL | INJECTION | Status: AC
Start: 2021-02-28 — End: 2021-02-27
  Administered 2021-02-27: 0 mL via INTRAVENOUS
  Administered 2021-02-27: 500 mL via INTRAVENOUS

## 2021-02-27 MED ORDER — CEFEPIME 1 GRAM/50 ML IN DEXTROSE 5 % INTRAVENOUS PIGGYBACK
1.0000 g | INJECTION | INTRAVENOUS | Status: AC
Start: 2021-02-27 — End: 2021-02-27
  Administered 2021-02-27: 1 g via INTRAVENOUS
  Administered 2021-02-27: 0 g via INTRAVENOUS
  Filled 2021-02-27: qty 50

## 2021-02-27 MED ORDER — SODIUM CHLORIDE 0.9 % INTRAVENOUS SOLUTION
10.0000 ug/min | INTRAVENOUS | Status: DC
Start: 2021-02-27 — End: 2021-02-28
  Administered 2021-02-27 – 2021-02-28 (×3): 10 ug/min via INTRAVENOUS
  Filled 2021-02-27: qty 1

## 2021-02-27 MED ORDER — INSULIN REGULAR HUMAN 100 UNIT/ML INJECTION - CHARGE BY DOSE
5.0000 [IU] | INTRAMUSCULAR | Status: DC
Start: 2021-02-27 — End: 2021-02-27

## 2021-02-27 MED ORDER — CALCIUM GLUCONATE 1 GRAM/50 ML IN SODIUM CHLORIDE, ISO-OSM IV SOLUTION
1.0000 g | INTRAVENOUS | Status: AC
Start: 2021-02-27 — End: 2021-02-27
  Administered 2021-02-27: 1 g via INTRAVENOUS
  Administered 2021-02-27: 0 g via INTRAVENOUS

## 2021-02-27 MED ORDER — CALCIUM GLUCONATE 1 GRAM/50 ML IN SODIUM CHLORIDE, ISO-OSM IV SOLUTION
1.0000 g | INTRAVENOUS | Status: AC
Start: 2021-02-27 — End: 2021-02-27
  Administered 2021-02-27: 0 g via INTRAVENOUS
  Administered 2021-02-27: 1 g via INTRAVENOUS
  Filled 2021-02-27: qty 50

## 2021-02-27 NOTE — ED Nurses Note (Signed)
21:15 [facility] for medical records  21:19 Faxed release form to [phone]  22:22 [facility] back to get update on medical records wanted more specific information on what doctor needed

## 2021-02-27 NOTE — ED Triage Notes (Signed)
Pt arrives to ER by EMS Max [name] from [facility]. Pt is bradycardic and lethargic. Missed dialysis last week due to not feeling well.

## 2021-02-27 NOTE — ED Nurses Note (Signed)
Patient readjusted in bed, blankets applied for comfort. Pt denies any other needs at this time.

## 2021-02-27 NOTE — ED Nurses Note (Signed)
Patient returned to room from CT scan. Pt tolerated procedure well and states no complaints at this time.

## 2021-02-27 NOTE — ED Provider Notes (Signed)
Emergency Department  Provider Note    Name: Diane Mochizuki.  Age and Gender: 51 y.o. male  PCP: Azzie Roup, MD        Triage Note:   Confusion and Slow Heart Rate          Subjective    HPI:  Max [name]. is a 51 y.o. male  who presents to the ED today for bradycardia, dizziness lightheadedness and near syncope with hypotension of unclear duration but patient states worse in the last 2 hours.  Patient reportedly missed dialysis earlier today because he has been feeling and well.    Review of recent medical records and discharge summary from [facility] with date of admission on 11/18/2020 and date of discharge on 02/14/2021 shows the following diagnosis acute on chronic cellulitis, osteomyelitis, acute on chronic iron deficiency anemia, MRSA bacteremia, group B strep bacteremia, cardiomyopathy, COPD, end-stage renal disease on hemodialysis, essential hypertension, systolic congestive heart failure with EF of 30-35%, recent BKA right lower extremity, type 2 diabetes, hyponatremia, CAD with PCI times 10, protein malnutrition, patient completed long-term daptomycin IV antibiotic therapy for his positive blood cultures, initiation of hemodialysis during recent hospitalization, developed a left IJ thrombus related to central venous catheter catheter was removed cultured and positive for MRSA, patient initiated on anticoagulation with warfarin,    Patient reportedly received recent blood transfusion after he was discharged from the hospital and while at the skilled nursing facility in new [facility].      Review of Systems: Other than those specifically stated in the HPI and below, all other systems reviewed and negative.      Below information reviewed with patient where pertinent:  Past Medical History:   Diagnosis Date   . Arthropathy    . CKD (chronic kidney disease), stage III (CMS HCC) 08/25/2019   . Congestive heart failure (CMS HCC)    . Coronary artery disease    . Diabetes  mellitus, type 2 (CMS HCC)    . Headache    . HTN (hypertension)        Current Outpatient Medications   Medication Sig   . amLODIPine (NORVASC) 10 mg Oral Tablet Take 10 mg by mouth Once a day   . aspirin (ECOTRIN) 81 mg Oral Tablet, Delayed Release (E.C.) Take 81 mg by mouth Once a day   . atorvastatin (LIPITOR) 80 mg Oral Tablet Take 80 mg by mouth Every evening   . carvediloL (COREG) 25 mg Oral Tablet Take 25 mg by mouth Twice daily with food   . gabapentin (NEURONTIN) 300 mg Oral Capsule Take 300 mg by mouth Three times a day   . isosorbide mononitrate (IMDUR) 60 mg Oral Tablet Sustained Release 24 hr Take 120 mg by mouth Every morning   . lisinopriL (PRINIVIL) 5 mg Oral Tablet Take 1 Tablet (5 mg total) by mouth Once a day for 30 days   . spironolactone (ALDACTONE) 50 mg Oral Tablet Take 50 mg by mouth Every morning with breakfast   . ticagrelor (BRILINTA) 90 mg Oral Tablet Take 90 mg by mouth Twice daily       Allergies   Allergen Reactions   . Ultram [Tramadol] Rash   . Benadryl [Diphenhydramine]  Other Adverse Reaction (Add comment)     irritable   . Penicillin G Benethamine        Past Surgical History:   Procedure Laterality Date   . AMPUTATION FOOT / TOE Left    .  CORONARY ARTERY ANGIOPLASTY     . HX CATARACT REMOVAL Bilateral        Social History     Tobacco Use   . Smoking status: Never   . Smokeless tobacco: Never   Vaping Use   . Vaping Use: Never used   Substance Use Topics   . Alcohol use: Never   . Drug use: Never           Objective  Nursing notes reviewed    ED Triage Vitals [02/27/21 2013]   BP (Non-Invasive) (!) 81/54   Heart Rate (!) 34   Respiratory Rate (!) 22   Temperature 36.2 C (97.1 F)   SpO2 99 %   Weight 91.1 kg (200 lb 12.8 oz)   Height 1.727 m (5\' 8" )     Filed Vitals:    02/27/21 2320 02/27/21 2330 02/27/21 2345 02/28/21 0015   BP: (!) 97/57 117/67 110/64 114/61   Pulse: 54 57 58 69   Resp: 17 16 14     Temp: 36.4 C (97.6 F)      SpO2: 99% 100% 99% 100%       Physical  Exam  . Vital signs Reviewed.   . Constitutional: NAD.   Marland Kitchen HENT:   o Head: Normocephalic and atraumatic   o Mouth/Throat: Oropharynx is clear and moist   o Eyes: EOMI, conjunctivae without discharge bilaterally  o Neck: Trachea midline , tunneled right IJ dialysis catheter is noted.  . Cardiovascular:  Severe bradycardia with regular rhythm noted on arrival, No murmurs, rubs or gallops   . Pulmonary/Chest: BS equal bilaterally, good air movement. No respiratory distress.  Scattered bilateral rales noted   . Abdominal: BS +. Abdomen soft, no tenderness, rebound or guarding  . Musculoskeletal:  Recent right BKA with dressing noted  . Skin: warm and dry. No rash, erythema, pallor or cyanosis  . Psychiatric: normal mood and affect. Behavior is normal   . Neurological: Alert&O with mild encephalopathy and no focal deficits.      Labs:   Results for orders placed or performed during the hospital encounter of 02/27/21 (from the past 24 hour(s))   BASIC METABOLIC PANEL   Result Value Ref Range    SODIUM 130 (L) 136 - 145 mmol/L    POTASSIUM 6.5 (HH) 3.5 - 5.1 mmol/L    CHLORIDE 97 96 - 111 mmol/L    CO2 TOTAL 18 (L) 22 - 30 mmol/L    ANION GAP 15 (H) 4 - 13 mmol/L    CALCIUM 8.2 (L) 8.5 - 10.0 mg/dL    GLUCOSE 251 (H) 65 - 125 mg/dL    BUN 50 (H) 8 - 25 mg/dL    CREATININE 5.25 (H) 0.75 - 1.35 mg/dL    BUN/CREA RATIO 10 6 - 22    ESTIMATED GFR 13 (L) >=60 mL/min/BSA   HEPATIC FUNCTION PANEL   Result Value Ref Range    ALBUMIN 2.0 (L) 3.5 - 5.0 g/dL     ALKALINE PHOSPHATASE 211 (H) 45 - 115 U/L    ALT (SGPT) 10 10 - 55 U/L    AST (SGOT)  16 8 - 45 U/L    BILIRUBIN TOTAL 0.6 0.3 - 1.3 mg/dL    BILIRUBIN DIRECT 0.4 0.1 - 0.4 mg/dL    PROTEIN TOTAL 7.5 6.4 - 8.3 g/dL   TROPONIN-I   Result Value Ref Range    TROPONIN I 37 (HH) <=30 ng/L   PT/INR   Result Value Ref  Range    PROTHROMBIN TIME 35.7 (H) 10.0 - 11.0 seconds    INR 3.90 (H) 0.00 - 3.50   VENOUS BLOOD GAS   Result Value Ref Range    PH (VENOUS) 7.27 (L) 7.31 - 7.41     PCO2 (VENOUS) 43 41 - 51 mm/Hg    PO2 (VENOUS) 23 (L) 35 - 50 mm/Hg    BICARBONATE (VENOUS) 19.4 (L) 22.0 - 26.0 mmol/L    O2 SATURATION (VENOUS) 30.1 (L) 40.0 - 85.0 %    (T) PCO2 20.8 (L) 23.0 - 29.0 mm/Hg    BASE DEFICIT 6.9 (H) -2.0 - 2.0 mmol/L   LACTIC ACID LEVEL W/ REFLEX FOR LEVEL >2.0   Result Value Ref Range    LACTIC ACID 3.3 (H) 0.5 - 2.2 mmol/L   B-TYPE NATRIURETIC PEPTIDE   Result Value Ref Range    BNP 812 (H) <=99 pg/mL   URINALYSIS WITH REFLEX MICROSCOPIC AND CULTURE IF POSITIVE    Specimen: Urine, Indwelling Catheter    Narrative    The following orders were created for panel order URINALYSIS WITH REFLEX MICROSCOPIC AND CULTURE IF POSITIVE.  Procedure                               Abnormality         Status                     ---------                               -----------         ------                     URINALYSIS, MACRO/MICRO[[id number]]      Abnormal            Final result               URINALYSIS, MICROSCOPIC[[id number]]      Abnormal            Final result                 Please view results for these tests on the individual orders.   URINALYSIS, MACRO/MICRO   Result Value Ref Range    COLOR Yellow Yellow    APPEARANCE Clear Clear    GLUCOSE Negative Negative mg/dL    LEUKOCYTES Negative Negative WBCs/uL    KETONES Trace (A) Negative mg/dL    BLOOD Trace (A) Negative mg/dL    PH 5.5 5.0 - 8.5    NITRITE Positive (A) Negative    PROTEIN 30 (A) Negative mg/dL    UROBILINOGEN 0.2 0.2 mg/dL    SPECIFIC GRAVITY 1.020 1.005 - 1.020    BILIRUBIN Small (A) Negative mg/dL   LACTIC ACID TIMED   Result Value Ref Range    LACTIC ACID 2.0 0.5 - 2.2 mmol/L   COVID-19, FLU A/B, RSV RAPID BY PCR   Result Value Ref Range    SARS-CoV-2 Not Detected Not Detected    INFLUENZA VIRUS TYPE A Not Detected Not Detected    INFLUENZA VIRUS TYPE B Not Detected Not Detected    RESPIRATORY SYNCTIAL VIRUS (RSV) Not Detected Not Detected    Narrative    Results are for the simultaneous qualitative identification of  SARS-CoV-2 (formerly 2019-nCoV), Influenza A, Influenza B, and  RSV RNA. These etiologic agents are generally detectable in nasopharyngeal and nasal swabs during the ACUTE PHASE of infection. Hence, this test is intended to be performed on respiratory specimens collected from individuals with signs and symptoms of upper respiratory tract infection who meet Centers for Disease Control and Prevention (CDC) clinical and/or epidemiological criteria for Coronavirus Disease 2019 (COVID-19) testing. CDC COVID-19 criteria for testing on human specimens is available at [facility] webpage information for Healthcare Professionals: Coronavirus Disease 2019 (COVID-19) ([email or web address]).     False-negative results may occur if the virus has genomic mutations, insertions, deletions, or rearrangements or if performed very early in the course of illness. Otherwise, negative results indicate virus specific RNA targets are not detected, however negative results do not preclude SARS-CoV-2 infection/COVID-19, Influenza, or Respiratory syncytial virus infection. Results should not be used as the sole basis for patient management decisions. Negative results must be combined with clinical observations, patient history, and epidemiological information. If upper respiratory tract infection is still suspected based on exposure history together with other clinical findings, re-testing should be considered.    Disclaimer:   This assay has been authorized by FDA under an Emergency Use Authorization for use in laboratories certified under the Clinical Laboratory Improvement Amendments of 1988 (CLIA), 42 U.S.C. [phone], to perform high complexity tests. The impacts of vaccines, antiviral therapeutics, antibiotics, chemotherapeutic or immunosuppressant drugs have not been evaluated.     Test methodology:   Cepheid Xpert Xpress SARS-CoV-2/Flu/RSV Assay real-time polymerase chain reaction (RT-PCR) test on the  GeneXpert Dx and Xpert Xpress systems.   URINALYSIS, MICROSCOPIC   Result Value Ref Range    RBCS 3-5 (A) None, 0-2 /hpf    WBCS 3-5 None, Occasional, 0-2, 3-5, Rare /hpf    BACTERIA Moderate (A) None /hpf    SQUAMOUS EPITHELIAL 0-2 /hpf    AMORPHOUS SEDIMENT Moderate (A) None /hpf    HYALINE CASTS Rare (A) None /lpf   ARTERIAL BLOOD GAS (TEMP COMP)   Result Value Ref Range    %FIO2 (ARTERIAL)      PH (T)      PH (ARTERIAL) 7.39 7.34 - 7.45    (T) PCO2 20.8 (L) 32.0 - 45.0 mm/Hg    PCO2 (ARTERIAL) 33 32 - 45 mm/Hg    (T) PO2      PO2 (ARTERIAL) 120 (H) 75 - 100 mm/Hg    BASE DEFICIT 4.6 (H) -2.3 - 2.3 mmol/L    BICARBONATE (ARTERIAL) 19.7 (L) 20.0 - 29.0 mmol/L    O2 SATURATION (ARTERIAL) 97.9 94.0 - 98.0 %    TEMPERATURE, COMP      ALLEN TEST      DRAW SITE     CBC/DIFF    Narrative    The following orders were created for panel order CBC/DIFF.  Procedure                               Abnormality         Status                     ---------                               -----------         ------  CBC WITH BJYN[[id number]]                Abnormal            Final result               SCAN DIFFERENTIAL[[id number]]                                Final result                 Please view results for these tests on the individual orders.   CBC WITH DIFF   Result Value Ref Range    WBC 11.9 (H) 4.6 - 10.2 x10^3/uL    RBC 2.69 (L) 4.69 - 6.13 x10^6/uL    HGB 7.4 (L) 14.1 - 18.1 g/dL    HCT 22.9 (L) 43.5 - 53.7 %    MCV 85.3 80.0 - 97.0 fL    MCH 27.6 27.0 - 31.2 pg    MCHC 32.4 31.8 - 35.4 g/dL    RDW 16.2 (H) 11.6 - 14.9 %    PLATELETS 514 (H) 142 - 424 x10^3/uL    NEUTROPHIL % 60 37 - 80 %    LYMPHOCYTE % 24 10 - 50 %    MONOCYTE % 14 (H) 0 - 12 %    EOSINOPHIL % 1 0 - 7 %    BASOPHIL % 1 0 - 3 %    HGB & HCT check -0.7     NEUTROPHIL # 7.20 x10^3/uL   SCAN DIFFERENTIAL   Result Value Ref Range    PLATELET ESTIMATE Increased     HYPOCHROMASIA Moderate     WBC MORPHOLOGY COMMENT Normal    BASIC METABOLIC  PANEL   Result Value Ref Range    SODIUM 131 (L) 136 - 145 mmol/L    POTASSIUM 6.1 (HH) 3.5 - 5.1 mmol/L    CHLORIDE 99 96 - 111 mmol/L    CO2 TOTAL 20 (L) 22 - 30 mmol/L    ANION GAP 12 4 - 13 mmol/L    CALCIUM 8.6 8.5 - 10.0 mg/dL    GLUCOSE 108 65 - 125 mg/dL    BUN 52 (H) 8 - 25 mg/dL    CREATININE 5.12 (H) 0.75 - 1.35 mg/dL    BUN/CREA RATIO 10 6 - 22    ESTIMATED GFR 13 (L) >=60 mL/min/BSA   TROPONIN-I   Result Value Ref Range    TROPONIN I 35 (HH) <=30 ng/L   TYPE AND SCREEN   Result Value Ref Range    UNITS ORDERED NOT STATED     ABO/RH(D) O POSITIVE     ANTIBODY SCREEN NEGATIVE     SPECIMEN EXPIRATION DATE 03/02/2021,2359    POC BLOOD GLUCOSE (RESULTS)   Result Value Ref Range    GLUCOSE, POC 255 (H) 74 - 106 mg/dl   POC BLOOD GLUCOSE (RESULTS)   Result Value Ref Range    GLUCOSE, POC 279 (H) 74 - 106 mg/dl       Imaging:  Results for orders placed or performed during the hospital encounter of 02/27/21 (from the past 24 hour(s))   XR AP MOBILE CHEST     Status: None    Narrative    Armen Pickup JR.    RADIOLOGIST: Brayton El, MD    XR AP MOBILE CHEST performed on 02/27/2021 8:36 PM    CLINICAL HISTORY: Dialysis patient  missed dialysis shortness of breath.      pt missed dialysis, sob, confusion, slow heart rate     TECHNIQUE: Frontal view of the chest.    COMPARISON:  08/26/2019      FINDINGS:  Right IJ dialysis catheter ends in the lower SVC.  The heart size is normal.     The lungs are clear.   No pleural effusion or pneumothorax.        Impression    NO ACUTE FINDINGS.      Radiologist location ID: [place] PELVIS WO IV CONTRAST     Status: None    Narrative    Armen Pickup JR.    RADIOLOGIST: Otho Najjar, MD    CT CHEST ABDOMEN PELVIS WO IV CONTRAST performed on 02/27/2021 11:06 PM    CLINICAL HISTORY: On warfarin with hypotension in heart failure and kidney failure.  missed dialysis, on warfarin, hypotension, heart failure and kidney failure     TECHNIQUE:   Chest, abdomen and pelvis CT without intravenous contrast.    COMPARISON:  None.  # of known CTs in the past 12 months: 0   # of known Cardiac Nuclear Medicine Studies in the past 12 months: 0    FINDINGS:  CT CHEST:  Lung windows:  There are tiny bilateral pleural effusions suggested. In the posterior sulci there are focal bilateral right greater than left consolidative infiltrates that have the appearance of pneumonitis. This would be difficult to visualize on plain radiographs unless lateral films were obtained.    Mediastinum: There is coronary artery calcification. There is cardiomegaly. There is a central line extending into the SVC on the right.    There are scattered lymph nodes present that are not size significant.    Bone windows: No acute bony abnormality. Lower thoracic degenerative change.      Impression    Bilateral inflammatory appearing lower lobe infiltrates with tiny effusions.      CT ABDOMEN/PELVIS:  Noncontrast technique limits evaluation of the abdominal and pelvic viscera.    Abdomen: No focal abnormality of the unenhanced liver, spleen, or pancreas.    There appears to be a couple of tiny gallstones layering dependently. No gross evidence of cholecystitis.    The aorta is moderately calcified. At the level of the left renal hilum there is a 13 mm periaortic node. Smaller periaortic nodes are also demonstrated on the left and right.    There is anasarca present with fatty haziness mildly involving the intraperitoneal fat as well.    Kidneys: No acute renal abnormality. No obstruction. Calcification in the left and right renal pelvis appears to be vascular.    Pelvis: Bladder contains a Foley catheter and is empty. There is moderate pelvic fluid present that is nonspecific.    There is concentric rectal mucosa thickening (image [phone]/292), suggesting proctitis. Clinical correlation recommended.    Attention to the colon demonstrates a moderate amount of fecal material. Mucosa of the  transverse and left colon is slightly prominent, probably normal however. Early colitis is a consideration. There is a normal appendix which is just under the surface of the right anterior abdomen. The cecum is mobile and extends towards the center of the pelvis.    Small bowel contains moderate amount of fluid with some air-fluid levels. This suggests an ileus.    Bone windows: No acute bony abnormality. Schmorl's node defects at L3-4 and particular. Endplate degenerative change present at this  level.    IMPRESSION:  1. Anasarca.  2. Possible proctitis.  3. Possible small bowel ileus.  4. Probable cholelythiasis.  5. Few prominent periaortic lymph nodes.  6. Other findings as above.    One or more dose reduction techniques were used (e.g., Automated exposure control, adjustment of the mA and/or kV according to patient size, use of iterative reconstruction technique).      Radiologist location ID: [place] IV CONTRAST     Status: None    Narrative    Armen Pickup JR.    RADIOLOGIST: Otho Najjar, MD    CT BRAIN WO IV CONTRAST performed on 02/27/2021 11:15 PM    CLINICAL HISTORY: On warfarin evaluate for head bleed.  Confusion; Slow Heart Rate    TECHNIQUE:  Head CT without intravenous contrast.    COMPARISON: None.  # of known CTs in the past 12 months: 0   # of known Cardiac Nuclear Medicine Studies in the past 12 months: 0    FINDINGS:  There is no acute intracranial hemorrhage, mass effect, or evidence of large acute infarct.    Brain: Within normal limits for age    CSF Spaces: Normal     Sinuses/Mastoids:  Clear at visualized levels     Bones: Unremarkable        Impression    No acute intracranial process.        One or more dose reduction techniques were used (e.g., Automated exposure control, adjustment of the mA and/or kV according to patient size, use of iterative reconstruction technique).      Radiologist location ID: [place] and imaging  reviewed.        Assessment/Plan    Orders:  Orders Placed This Encounter   . ADULT ROUTINE BLOOD CULTURE, SET OF 2 ADULT BOTTLES (BACTERIA AND YEAST)   . ADULT ROUTINE BLOOD CULTURE, SET OF 2 ADULT BOTTLES (BACTERIA AND YEAST)   . URINE CULTURE,ROUTINE   . XR AP MOBILE CHEST   . CT BRAIN WO IV CONTRAST   . CT CHEST ABDOMEN PELVIS WO IV CONTRAST   . BASIC METABOLIC PANEL   . HEPATIC FUNCTION PANEL   . TROPONIN-I   . PT/INR   . VENOUS BLOOD GAS   . LACTIC ACID LEVEL W/ REFLEX FOR LEVEL >2.0   . B-TYPE NATRIURETIC PEPTIDE   . URINALYSIS WITH REFLEX MICROSCOPIC AND CULTURE IF POSITIVE   . URINALYSIS, MACRO/MICRO   . LACTIC ACID TIMED   . COVID-19, FLU A/B, RSV RAPID BY PCR   . URINALYSIS, MICROSCOPIC   . CANCELED: COVID-19, FLU A/B, RSV RAPID BY PCR   . ARTERIAL BLOOD GAS (TEMP COMP)   . CBC/DIFF   . CBC WITH DIFF   . SCAN DIFFERENTIAL   . BASIC METABOLIC PANEL   . TROPONIN-I   . OXYGEN - NASAL CANNULA   . ECG 12 LEAD   . ECG 12 LEAD   . PERFORM POC WHOLE BLOOD GLUCOSE   . TYPE AND SCREEN   . calcium gluconate 1 g in iso-osmotic 50 mL premix IVPB   . calcium gluconate 1 g in iso-osmotic 50 mL premix IVPB   . dextrose 50% (0.5 g/mL) injection - syringe   . atropine 0.1 mg/mL syringe   . insulin R human 100 units/mL injection   . sodium bicarbonate 1 mEq/mL 50 mL injection   . ceFEPime (  MAXIPIME) 1g in D5W 50 mL duplex IVPB   . vancomycin (VANCOCIN) 1,250 mg in NS 250 mL IVPB - DOCKED   . EPINEPHrine (ADRENALIN) 1 mg in NS 250 mL infusion   . sodium zirconium cyclosilicate (LOKELMA) powder   . albuterol (PROVENTIL) 2.5 mg / 3 mL (0.083%) neb solution   . NS bolus infusion 500 mL   . ondansetron (ZOFRAN) 2 mg/mL injection   . prochlorperazine (COMPAZINE) 5 mg/mL injection       Clinical Impression:     Clinical Impression   Hyperkalemia (Primary)   Pneumonia   Junctional rhythm   Cardiomyopathy, unspecified type (CMS HCC)   Dialysis patient (CMS [place])   Warfarin anticoagulation   Altered mental status, unspecified altered  mental status type   Anemia, unspecified type   UTI (urinary tract infection)         Course and MDM:  Patient seen and examined.  Max [name]. is a 51 y.o. male who presented with hyperkalemia potassium of 6.5 after missing dialysis earlier today with severe bradycardia hypotension altered mental status.  Patient was treated with the complete anti hyperkalemia regimen including IV dextrose, IV insulin, IV calcium gluconate, IV bicarbonate, and oral Lokelma.  Repeat potassium is down to 6.1.  Patient also initially received atropine 1 mg IV with minimal transient improvement in heart rate to 44 beats per minute.  He then subsequently received 4 doses of 100 mcg IV epinephrine with incremental improvement in heart rate and blood pressure resulting in a total of 4 doses administered of 400 mcg epinephrine.  Patient was then placed on epinephrine infusion at initially 5 micrograms/minute followed by increased to 10 micrograms/minute with stabilization of patient's blood pressure and heart rate.  Repeat EKG showed junctional rhythm at a rate of 58 beats per minute.  Subsequent rhythm monitoring reveals evidence of return of sinus rhythm.  Patient is known to have significant ischemic cardiomyopathy as noted above.  Patient did receive IVF plus antibiotics and other intravenous fluids totaling 1500 mL during his stay in our emergency department.  Urinalysis was obtained via Foley catheter and it is possible patient has a UTI as well as bilateral pneumonia based on CT scan.  Patient did receive cefepime and vancomycin in our emergency department preceded with blood cultures.  Patient is accepted in transfer to [facility] ICU by Dr. Rod Holler.  Patient maintained on Venti mask at 10 liters/minute with excellent oxygenation on without CO2 retention.           Critical Care: Critical Care Time:  Total critical care time spent in direct care of this patient at high risk based on presenting history/exam/and  complaint, including initial evaluation and stabilization, review of data, re-examination, discussion with admitting and consulting services to arrange definitive care, and exclusive of any procedures performed, was 75 minutes.          Disposition: Transfered to Another Facility        Follow up:   No follow-up provider specified.              Parts of this patient's chart were completed in a retrospective fashion due to simultaneous direct patient care activities in the Emergency Department.

## 2021-02-27 NOTE — ED Nurses Note (Addendum)
22:15 Called [facility] for Dr. Caffie Pinto  22:18 [facility] called back for clarification on last dialysis treatment  22:40 [facility] called back accepted by Dr. Rod Holler to MICU, will called back with bed   22:46 Called [facility] for weather check can not do till bed assigned  22:47 [place] to check on bed status, they are working on it

## 2021-02-27 NOTE — ED Nurses Note (Signed)
Attempted multiple times to call emergency contacts listed in patient's chart to notify of patient being transferred to [facility] when bed available. No answer from either contact listed.   BEENE,CYNTHIA     [phone] Arloa Koh   [phone] other

## 2021-02-27 NOTE — ED Nurses Note (Signed)
Respiratory in room with patient administering breathing tx.

## 2021-02-28 ENCOUNTER — Inpatient Hospital Stay
Admission: AD | Admit: 2021-02-28 | Discharge: 2021-03-09 | DRG: 871 | Disposition: A | Discharge status: Skilled Nursing Facility | Payer: Medicare (Managed Care) | Source: Other Acute Inpatient Hospital | Attending: Student in an Organized Health Care Education/Training Program | Admitting: Student in an Organized Health Care Education/Training Program

## 2021-02-28 ENCOUNTER — Inpatient Hospital Stay (HOSPITAL_COMMUNITY): Payer: Medicare (Managed Care)

## 2021-02-28 ENCOUNTER — Inpatient Hospital Stay (HOSPITAL_COMMUNITY): Payer: Medicare (Managed Care) | Admitting: PULMONARY DISEASE

## 2021-02-28 DIAGNOSIS — R319 Hematuria, unspecified: Secondary | ICD-10-CM | POA: Diagnosis present

## 2021-02-28 DIAGNOSIS — J189 Pneumonia, unspecified organism: Secondary | ICD-10-CM | POA: Diagnosis present

## 2021-02-28 DIAGNOSIS — E11622 Type 2 diabetes mellitus with other skin ulcer: Secondary | ICD-10-CM | POA: Diagnosis present

## 2021-02-28 DIAGNOSIS — N186 End stage renal disease: Secondary | ICD-10-CM | POA: Diagnosis present

## 2021-02-28 DIAGNOSIS — J690 Pneumonitis due to inhalation of food and vomit: Secondary | ICD-10-CM | POA: Diagnosis present

## 2021-02-28 DIAGNOSIS — E871 Hypo-osmolality and hyponatremia: Secondary | ICD-10-CM | POA: Diagnosis present

## 2021-02-28 DIAGNOSIS — Z89511 Acquired absence of right leg below knee: Secondary | ICD-10-CM

## 2021-02-28 DIAGNOSIS — E785 Hyperlipidemia, unspecified: Secondary | ICD-10-CM | POA: Diagnosis present

## 2021-02-28 DIAGNOSIS — I4510 Unspecified right bundle-branch block: Secondary | ICD-10-CM | POA: Diagnosis present

## 2021-02-28 DIAGNOSIS — I517 Cardiomegaly: Secondary | ICD-10-CM

## 2021-02-28 DIAGNOSIS — Z9981 Dependence on supplemental oxygen: Secondary | ICD-10-CM

## 2021-02-28 DIAGNOSIS — Z89422 Acquired absence of other left toe(s): Secondary | ICD-10-CM

## 2021-02-28 DIAGNOSIS — G9341 Metabolic encephalopathy: Secondary | ICD-10-CM | POA: Diagnosis present

## 2021-02-28 DIAGNOSIS — E1122 Type 2 diabetes mellitus with diabetic chronic kidney disease: Secondary | ICD-10-CM | POA: Diagnosis present

## 2021-02-28 DIAGNOSIS — J9621 Acute and chronic respiratory failure with hypoxia: Secondary | ICD-10-CM | POA: Diagnosis present

## 2021-02-28 DIAGNOSIS — Z7902 Long term (current) use of antithrombotics/antiplatelets: Secondary | ICD-10-CM

## 2021-02-28 DIAGNOSIS — E875 Hyperkalemia: Secondary | ICD-10-CM | POA: Diagnosis present

## 2021-02-28 DIAGNOSIS — D631 Anemia in chronic kidney disease: Secondary | ICD-10-CM | POA: Diagnosis present

## 2021-02-28 DIAGNOSIS — Z79899 Other long term (current) drug therapy: Secondary | ICD-10-CM

## 2021-02-28 DIAGNOSIS — L97422 Non-pressure chronic ulcer of left heel and midfoot with fat layer exposed: Secondary | ICD-10-CM | POA: Diagnosis present

## 2021-02-28 DIAGNOSIS — I21A1 Myocardial infarction type 2: Secondary | ICD-10-CM | POA: Diagnosis present

## 2021-02-28 DIAGNOSIS — R9431 Abnormal electrocardiogram [ECG] [EKG]: Secondary | ICD-10-CM

## 2021-02-28 DIAGNOSIS — Z959 Presence of cardiac and vascular implant and graft, unspecified: Secondary | ICD-10-CM

## 2021-02-28 DIAGNOSIS — I5033 Acute on chronic diastolic (congestive) heart failure: Secondary | ICD-10-CM | POA: Diagnosis present

## 2021-02-28 DIAGNOSIS — J440 Chronic obstructive pulmonary disease with acute lower respiratory infection: Secondary | ICD-10-CM | POA: Diagnosis present

## 2021-02-28 DIAGNOSIS — I132 Hypertensive heart and chronic kidney disease with heart failure and with stage 5 chronic kidney disease, or end stage renal disease: Secondary | ICD-10-CM | POA: Diagnosis present

## 2021-02-28 DIAGNOSIS — D509 Iron deficiency anemia, unspecified: Secondary | ICD-10-CM | POA: Diagnosis present

## 2021-02-28 DIAGNOSIS — Z7901 Long term (current) use of anticoagulants: Secondary | ICD-10-CM

## 2021-02-28 DIAGNOSIS — Z955 Presence of coronary angioplasty implant and graft: Secondary | ICD-10-CM

## 2021-02-28 DIAGNOSIS — Z86718 Personal history of other venous thrombosis and embolism: Secondary | ICD-10-CM

## 2021-02-28 DIAGNOSIS — R001 Bradycardia, unspecified: Secondary | ICD-10-CM

## 2021-02-28 DIAGNOSIS — E1151 Type 2 diabetes mellitus with diabetic peripheral angiopathy without gangrene: Secondary | ICD-10-CM | POA: Diagnosis present

## 2021-02-28 DIAGNOSIS — J984 Other disorders of lung: Secondary | ICD-10-CM

## 2021-02-28 DIAGNOSIS — I2510 Atherosclerotic heart disease of native coronary artery without angina pectoris: Secondary | ICD-10-CM | POA: Diagnosis present

## 2021-02-28 DIAGNOSIS — A419 Sepsis, unspecified organism: Principal | ICD-10-CM | POA: Diagnosis present

## 2021-02-28 DIAGNOSIS — Z992 Dependence on renal dialysis: Secondary | ICD-10-CM

## 2021-02-28 DIAGNOSIS — Z7982 Long term (current) use of aspirin: Secondary | ICD-10-CM

## 2021-02-28 DIAGNOSIS — T8789 Other complications of amputation stump: Secondary | ICD-10-CM | POA: Diagnosis present

## 2021-02-28 DIAGNOSIS — R918 Other nonspecific abnormal finding of lung field: Secondary | ICD-10-CM

## 2021-02-28 DIAGNOSIS — Y835 Amputation of limb(s) as the cause of abnormal reaction of the patient, or of later complication, without mention of misadventure at the time of the procedure: Secondary | ICD-10-CM | POA: Diagnosis present

## 2021-02-28 DIAGNOSIS — E1165 Type 2 diabetes mellitus with hyperglycemia: Secondary | ICD-10-CM | POA: Diagnosis present

## 2021-02-28 DIAGNOSIS — Z794 Long term (current) use of insulin: Secondary | ICD-10-CM

## 2021-02-28 DIAGNOSIS — I509 Heart failure, unspecified: Secondary | ICD-10-CM

## 2021-02-28 LAB — C-REACTIVE PROTEIN(CRP),INFLAMMATION: CRP INFLAMMATION: 108.7 mg/L — ABNORMAL HIGH (ref <8.0)

## 2021-02-28 LAB — TROPONIN-I
TROPONIN I: 35 ng/L (ref <=30)
TROPONIN I: 39 ng/L — ABNORMAL HIGH (ref 0–30)
TROPONIN I: 50 ng/L — ABNORMAL HIGH (ref 0–30)
TROPONIN I: 103 ng/L — ABNORMAL HIGH (ref 0–30)
TROPONIN I: 105 ng/L — ABNORMAL HIGH (ref 0–30)

## 2021-02-28 LAB — ECG 12-LEAD
Calculated R Axis: 142 degrees
Calculated T Axis: -74 degrees
QRS Duration: 144 ms
QT Interval: 472 ms
QTC Calculation: 463 ms
Ventricular rate: 58 {beats}/min

## 2021-02-28 LAB — BASIC METABOLIC PANEL
ANION GAP: 12 mmol/L (ref 4–13)
ANION GAP: 14 mmol/L — ABNORMAL HIGH (ref 4–13)
ANION GAP: 12 mmol/L (ref 4–13)
BUN/CREA RATIO: 10 (ref 6–22)
BUN/CREA RATIO: 11 (ref 6–22)
BUN/CREA RATIO: 11 (ref 6–22)
BUN: 52 mg/dL — ABNORMAL HIGH (ref 8–25)
BUN: 55 mg/dL — ABNORMAL HIGH (ref 8–25)
BUN: 37 mg/dL — ABNORMAL HIGH (ref 8–25)
CALCIUM: 8.6 mg/dL (ref 8.5–10.0)
CALCIUM: 8.4 mg/dL — ABNORMAL LOW (ref 8.5–10.0)
CALCIUM: 8.2 mg/dL — ABNORMAL LOW (ref 8.5–10.0)
CHLORIDE: 99 mmol/L (ref 96–111)
CHLORIDE: 97 mmol/L (ref 96–111)
CHLORIDE: 101 mmol/L (ref 96–111)
CO2 TOTAL: 20 mmol/L — ABNORMAL LOW (ref 22–30)
CO2 TOTAL: 19 mmol/L — ABNORMAL LOW (ref 22–30)
CO2 TOTAL: 20 mmol/L — ABNORMAL LOW (ref 22–30)
CREATININE: 5.12 mg/dL — ABNORMAL HIGH (ref 0.75–1.35)
CREATININE: 4.92 mg/dL — ABNORMAL HIGH (ref 0.75–1.35)
CREATININE: 3.52 mg/dL — ABNORMAL HIGH (ref 0.75–1.35)
ESTIMATED GFR: 13 mL/min/BSA — ABNORMAL LOW (ref >=60)
ESTIMATED GFR: 14 mL/min/BSA — ABNORMAL LOW (ref >=60)
ESTIMATED GFR: 21 mL/min/BSA — ABNORMAL LOW (ref >=60)
GLUCOSE: 108 mg/dL (ref 65–125)
GLUCOSE: 105 mg/dL (ref 65–125)
GLUCOSE: 104 mg/dL (ref 65–125)
POTASSIUM: 6.1 mmol/L (ref 3.5–5.1)
POTASSIUM: 6.2 mmol/L (ref 3.5–5.1)
POTASSIUM: 4.5 mmol/L (ref 3.5–5.1)
SODIUM: 131 mmol/L — ABNORMAL LOW (ref 136–145)
SODIUM: 130 mmol/L — ABNORMAL LOW (ref 136–145)
SODIUM: 133 mmol/L — ABNORMAL LOW (ref 136–145)

## 2021-02-28 LAB — CBC WITH DIFF
BASOPHIL #: <0.1 10*3/uL (ref <=0.20)
BASOPHIL %: 0 %
EOSINOPHIL #: <0.1 10*3/uL (ref <=0.50)
EOSINOPHIL %: 0 %
HCT: 23.1 % — ABNORMAL LOW (ref 38.9–52.0)
HGB: 7.2 g/dL — ABNORMAL LOW (ref 13.4–17.5)
IMMATURE GRANULOCYTE #: 0.19 10*3/uL — ABNORMAL HIGH (ref <0.10)
IMMATURE GRANULOCYTE %: 1 % (ref 0–1)
LYMPHOCYTE #: 1.96 10*3/uL (ref 1.00–4.80)
LYMPHOCYTE %: 11 %
MCH: 26.9 pg (ref 26.0–32.0)
MCHC: 31.2 g/dL (ref 31.0–35.5)
MCV: 86.2 fL (ref 78.0–100.0)
MONOCYTE #: 1.94 10*3/uL — ABNORMAL HIGH (ref 0.20–1.10)
MONOCYTE %: 11 %
MPV: 9.5 fL (ref 8.7–12.5)
NEUTROPHIL #: 13.14 10*3/uL — ABNORMAL HIGH (ref 1.50–7.70)
NEUTROPHIL %: 77 %
PLATELETS: 516 10*3/uL — ABNORMAL HIGH (ref 150–400)
RBC: 2.68 10*6/uL — ABNORMAL LOW (ref 4.50–6.10)
RDW-CV: 15.7 % — ABNORMAL HIGH (ref 11.5–15.5)
WBC: 17.3 10*3/uL — ABNORMAL HIGH (ref 3.7–11.0)

## 2021-02-28 LAB — VENOUS BLOOD GAS WITH LACTATE REFLEX
%FIO2 (VENOUS): 44 %
BASE DEFICIT: 4.3 mmol/L — ABNORMAL HIGH (ref <=3.0)
BICARBONATE (VENOUS): 20.5 mmol/L — ABNORMAL LOW (ref 22.0–26.0)
LACTATE: 1.6 mmol/L — ABNORMAL HIGH (ref 0.0–1.3)
PCO2 (VENOUS): 42 mm/Hg (ref 41–51)
PH (VENOUS): 7.32 (ref 7.31–7.41)
PO2 (VENOUS): 34 mm/Hg — ABNORMAL LOW (ref 35–50)

## 2021-02-28 LAB — H & H
HCT: 22.2 % — ABNORMAL LOW (ref 38.9–52.0)
HCT: 21.8 % — ABNORMAL LOW (ref 38.9–52.0)
HGB: 7 g/dL — ABNORMAL LOW (ref 13.4–17.5)
HGB: 6.8 g/dL — CL (ref 13.4–17.5)

## 2021-02-28 LAB — TYPE AND SCREEN: ABO/RH(D): O POS

## 2021-02-28 LAB — PHOSPHORUS
PHOSPHORUS: 7.2 mg/dL — ABNORMAL HIGH (ref 2.4–4.7)
PHOSPHORUS: 5.4 mg/dL — ABNORMAL HIGH (ref 2.4–4.7)

## 2021-02-28 LAB — HGA1C (HEMOGLOBIN A1C WITH EST AVG GLUCOSE)
ESTIMATED AVERAGE GLUCOSE: 114 mg/dL
HEMOGLOBIN A1C: 5.6 % (ref 4.0–5.6)

## 2021-02-28 LAB — HEPATITIS C ANTIBODY SCREEN WITH REFLEX TO HCV PCR: HCV ANTIBODY QUALITATIVE: NEGATIVE

## 2021-02-28 LAB — TYPE AND CROSS RED CELLS - UNITS: ANTIBODY SCREEN: NEGATIVE

## 2021-02-28 LAB — POC BLOOD GLUCOSE (RESULTS)
GLUCOSE, POC: 117 mg/dl — ABNORMAL HIGH (ref 70–105)
GLUCOSE, POC: 113 mg/dl — ABNORMAL HIGH (ref 70–105)
GLUCOSE, POC: 161 mg/dl — ABNORMAL HIGH (ref 70–105)
GLUCOSE, POC: 302 mg/dl — ABNORMAL HIGH (ref 70–105)

## 2021-02-28 LAB — MRSA SCREEN, PCR, RAPID: MRSA COLONIZATION SCREEN: POSITIVE — AB

## 2021-02-28 LAB — B-TYPE NATRIURETIC PEPTIDE: BNP: 1114 pg/mL — ABNORMAL HIGH (ref <=100)

## 2021-02-28 LAB — STREPTOCOCCUS PNEUMONIAE ANTIGEN,URINE: S.PNEUMONIA ANTIGEN: NEGATIVE

## 2021-02-28 LAB — MAGNESIUM
MAGNESIUM: 1.4 mg/dL — ABNORMAL LOW (ref 1.8–2.6)
MAGNESIUM: 1.4 mg/dL — ABNORMAL LOW (ref 1.8–2.6)

## 2021-02-28 LAB — HEPATIC FUNCTION PANEL
ALBUMIN: 2 g/dL — ABNORMAL LOW (ref 3.5–5.0)
ALKALINE PHOSPHATASE: 226 U/L — ABNORMAL HIGH (ref 45–115)
ALT (SGPT): 10 U/L (ref 10–55)
AST (SGOT): 37 U/L (ref 8–45)
BILIRUBIN DIRECT: 0.4 mg/dL (ref 0.1–0.4)
BILIRUBIN TOTAL: 0.6 mg/dL (ref 0.3–1.3)
PROTEIN TOTAL: 7.1 g/dL (ref 6.4–8.3)

## 2021-02-28 LAB — PT/INR
INR: 3.94 — ABNORMAL HIGH (ref 0.80–1.20)
PROTHROMBIN TIME: 46.6 seconds — ABNORMAL HIGH (ref 9.1–13.9)

## 2021-02-28 LAB — LEGIONELLA URINE ANTIGEN: LEGIONELLA ANTIGEN: NEGATIVE

## 2021-02-28 LAB — PROCALCITONIN ALGORITHM: PROCALCITONIN: 1.85 ng/mL — ABNORMAL HIGH (ref <0.50)

## 2021-02-28 LAB — HEPATITIS B SURFACE ANTIGEN: HBV SURFACE ANTIGEN QUALITATIVE: NEGATIVE

## 2021-02-28 LAB — VANCOMYCIN, RANDOM: VANCOMYCIN RANDOM: 23.4 ug/mL — ABNORMAL LOW (ref 30.0–40.0)

## 2021-02-28 LAB — FERRITIN: FERRITIN: 3344 ng/mL — ABNORMAL HIGH (ref 20–300)

## 2021-02-28 LAB — BPAM PACKED CELL ORDER

## 2021-02-28 LAB — IRON TRANSFERRIN AND TIBC
IRON (TRANSFERRIN) SATURATION: 18 % (ref 15–50)
IRON: 36 ug/dL — ABNORMAL LOW (ref 55–175)
TOTAL IRON BINDING CAPACITY: 199 ug/dL — ABNORMAL LOW (ref 252–504)
TRANSFERRIN: 142 mg/dL — ABNORMAL LOW (ref 180–360)

## 2021-02-28 LAB — LACTIC ACID TIMED: LACTIC ACID: 2 mmol/L (ref 0.5–2.2)

## 2021-02-28 LAB — LACTIC ACID LEVEL W/ REFLEX FOR LEVEL >2.0: LACTIC ACID: 1.8 mmol/L (ref 0.5–2.2)

## 2021-02-28 LAB — CREATINE KINASE (CK), TOTAL, SERUM: CREATINE KINASE: 29 U/L — ABNORMAL LOW (ref 45–225)

## 2021-02-28 LAB — THYROID STIMULATING HORMONE WITH FREE T4 REFLEX: TSH: 2.85 u[IU]/mL (ref 0.430–3.550)

## 2021-02-28 MED ORDER — SODIUM CHLORIDE 0.9 % INTRAVENOUS SOLUTION
0.0200 ug/kg/min | INTRAVENOUS | Status: DC
Start: 2021-02-28 — End: 2021-02-28
  Administered 2021-02-28: 0 ug/kg/min via INTRAVENOUS
  Administered 2021-02-28 (×2): 0.1 ug/kg/min via INTRAVENOUS
  Administered 2021-02-28: 0.15 ug/kg/min via INTRAVENOUS
  Administered 2021-02-28: 0.05 ug/kg/min via INTRAVENOUS
  Filled 2021-02-28: qty 15

## 2021-02-28 MED ORDER — SODIUM CHLORIDE 0.9 % INTRAVENOUS PIGGYBACK
2.0000 g | INJECTION | INTRAVENOUS | Status: DC
Start: 2021-02-28 — End: 2021-02-28

## 2021-02-28 MED ORDER — POLYETHYLENE GLYCOL 3350 17 GRAM ORAL POWDER PACKET
17.0000 g | Freq: Every day | ORAL | Status: DC
Start: 2021-02-28 — End: 2021-03-09
  Administered 2021-02-28: 0 g via ORAL
  Administered 2021-03-01: 17 g via ORAL
  Administered 2021-03-02: 0 g via ORAL
  Administered 2021-03-03 – 2021-03-04 (×2): 17 g via ORAL
  Administered 2021-03-05 – 2021-03-09 (×5): 0 g via ORAL
  Filled 2021-02-28 (×6): qty 1

## 2021-02-28 MED ORDER — SODIUM CHLORIDE 0.9 % INTRAVENOUS PIGGYBACK
1.0000 g | INJECTION | Freq: Two times a day (BID) | INTRAVENOUS | Status: DC
Start: 2021-02-28 — End: 2021-02-28
  Administered 2021-02-28: 0 g via INTRAVENOUS
  Administered 2021-02-28: 1 g via INTRAVENOUS
  Filled 2021-02-28: qty 10

## 2021-02-28 MED ORDER — EPINEPHRINE 1 MG/ML INJECTION SOLUTION
INTRAMUSCULAR | Status: AC
Start: 2021-02-28 — End: 2021-03-01
  Filled 2021-02-28: qty 30

## 2021-02-28 MED ORDER — PROCHLORPERAZINE EDISYLATE 10 MG/2 ML (5 MG/ML) INJECTION SOLUTION
5.0000 mg | Freq: Four times a day (QID) | INTRAMUSCULAR | Status: DC | PRN
Start: 2021-02-28 — End: 2021-02-28
  Administered 2021-02-28: 5 mg via INTRAVENOUS
  Filled 2021-02-28: qty 2

## 2021-02-28 MED ORDER — SODIUM CHLORIDE 0.9% FLUSH BAG - 250 ML
INTRAVENOUS | Status: DC | PRN
Start: 2021-02-28 — End: 2021-03-09

## 2021-02-28 MED ORDER — ACETAMINOPHEN 325 MG TABLET
650.0000 mg | ORAL_TABLET | ORAL | Status: DC | PRN
Start: 2021-02-28 — End: 2021-02-28
  Administered 2021-02-28: 0 mg via ORAL
  Filled 2021-02-28: qty 2

## 2021-02-28 MED ORDER — POTASSIUM CHLORIDE 2 MEQ/ML INTRAVENOUS SOLUTION
INTRAVENOUS | Status: AC
Start: 2021-02-28 — End: 2021-02-28

## 2021-02-28 MED ORDER — VANCOMYCIN IV - PHARMACIST TO DOSE PER PROTOCOL - NO EXCLUSION CRITERIA
Freq: Every day | Status: DC | PRN
Start: 2021-02-28 — End: 2021-03-04

## 2021-02-28 MED ORDER — PREMIXED HEMODIALYSATE
INJECTION | INTRAVENOUS | Status: AC
Start: 2021-03-01 — End: 2021-03-01

## 2021-02-28 MED ORDER — INSULIN REGULAR HUMAN 100 UNIT/ML INJECTION - CHARGE BY DOSE
10.0000 [IU] | INTRAMUSCULAR | Status: DC
Start: 2021-02-28 — End: 2021-02-28

## 2021-02-28 MED ORDER — HYDROCODONE 5 MG-ACETAMINOPHEN 325 MG TABLET
1.0000 | ORAL_TABLET | Freq: Four times a day (QID) | ORAL | Status: DC | PRN
Start: 2021-02-28 — End: 2021-02-28
  Administered 2021-02-28: 1 via ORAL
  Filled 2021-02-28: qty 1

## 2021-02-28 MED ORDER — SODIUM CHLORIDE 0.9 % (FLUSH) INJECTION SYRINGE
2.0000 mL | INJECTION | INTRAMUSCULAR | Status: DC | PRN
Start: 2021-02-28 — End: 2021-03-09

## 2021-02-28 MED ORDER — SODIUM CITRATE 4 % (3 ML) INTRA-CATHETER INJECTION SYRINGE
2.0000 | INJECTION | Status: AC
Start: 2021-02-28 — End: 2021-02-28
  Administered 2021-02-28: 2
  Filled 2021-02-28 (×2): qty 6

## 2021-02-28 MED ORDER — SODIUM CHLORIDE 0.9 % IV BOLUS
40.0000 mL | INJECTION | Freq: Once | Status: AC | PRN
Start: 2021-02-28 — End: 2021-03-01

## 2021-02-28 MED ORDER — MAGNESIUM SULFATE 4 GRAM/100 ML (4 %) IN WATER INTRAVENOUS PIGGYBACK
4.0000 g | INJECTION | INTRAVENOUS | Status: AC
Start: 2021-02-28 — End: 2021-02-28
  Administered 2021-02-28: 4 g via INTRAVENOUS
  Administered 2021-02-28: 0 g via INTRAVENOUS
  Filled 2021-02-28: qty 100

## 2021-02-28 MED ORDER — SODIUM CHLORIDE 0.9 % (FLUSH) INJECTION SYRINGE
2.0000 mL | INJECTION | Freq: Three times a day (TID) | INTRAMUSCULAR | Status: DC
Start: 2021-02-28 — End: 2021-03-09
  Administered 2021-02-28: 0 mL
  Administered 2021-02-28 – 2021-03-01 (×3): 4 mL
  Administered 2021-03-01: 5 mL
  Administered 2021-03-01 – 2021-03-02 (×2): 0 mL
  Administered 2021-03-02: 6 mL
  Administered 2021-03-02: 5 mL
  Administered 2021-03-03: 2 mL
  Administered 2021-03-03: 0 mL
  Administered 2021-03-03: 5 mL
  Administered 2021-03-04 (×2): 0 mL
  Administered 2021-03-04: 4 mL
  Administered 2021-03-05: 0 mL
  Administered 2021-03-05: 2 mL
  Administered 2021-03-05: 6 mL
  Administered 2021-03-06: 5 mL
  Administered 2021-03-06 – 2021-03-07 (×3): 2 mL
  Administered 2021-03-07 (×2): 3 mL
  Administered 2021-03-08 (×2): 0 mL
  Administered 2021-03-08: 6 mL
  Administered 2021-03-09 (×2): 0 mL

## 2021-02-28 MED ORDER — INSULIN LISPRO 100 UNIT/ML SUB-Q SSIP
0.0000 [IU] | INJECTION | SUBCUTANEOUS | Status: DC | PRN
Start: 2021-02-28 — End: 2021-03-09
  Administered 2021-02-28: 2 [IU] via SUBCUTANEOUS
  Administered 2021-02-28: 6 [IU] via SUBCUTANEOUS
  Administered 2021-03-01: 4 [IU] via SUBCUTANEOUS
  Administered 2021-03-01 – 2021-03-02 (×2): 2 [IU] via SUBCUTANEOUS
  Administered 2021-03-02 (×2): 4 [IU] via SUBCUTANEOUS
  Administered 2021-03-02: 2 [IU] via SUBCUTANEOUS
  Administered 2021-03-03: 4 [IU] via SUBCUTANEOUS
  Administered 2021-03-03: 6 [IU] via SUBCUTANEOUS
  Administered 2021-03-03 – 2021-03-04 (×2): 4 [IU] via SUBCUTANEOUS
  Administered 2021-03-04 – 2021-03-05 (×4): 2 [IU] via SUBCUTANEOUS
  Administered 2021-03-05: 6 [IU] via SUBCUTANEOUS
  Administered 2021-03-06: 2 [IU] via SUBCUTANEOUS
  Administered 2021-03-06: 4 [IU] via SUBCUTANEOUS
  Administered 2021-03-07 (×4): 2 [IU] via SUBCUTANEOUS
  Administered 2021-03-08: 0 [IU] via SUBCUTANEOUS
  Administered 2021-03-09: 6 [IU] via SUBCUTANEOUS
  Filled 2021-02-28: qty 18
  Filled 2021-02-28 (×2): qty 6
  Filled 2021-02-28 (×2): qty 12
  Filled 2021-02-28: qty 3
  Filled 2021-02-28 (×2): qty 6
  Filled 2021-02-28: qty 3
  Filled 2021-02-28: qty 12
  Filled 2021-02-28: qty 18
  Filled 2021-02-28: qty 12
  Filled 2021-02-28: qty 6
  Filled 2021-02-28: qty 18
  Filled 2021-02-28: qty 6
  Filled 2021-02-28: qty 12
  Filled 2021-02-28 (×4): qty 6
  Filled 2021-02-28: qty 12
  Filled 2021-02-28: qty 18
  Filled 2021-02-28: qty 6

## 2021-02-28 MED ORDER — METRONIDAZOLE 250 MG TABLET
500.0000 mg | ORAL_TABLET | Freq: Two times a day (BID) | ORAL | Status: DC
Start: 2021-02-28 — End: 2021-02-28
  Administered 2021-02-28: 500 mg via ORAL
  Filled 2021-02-28 (×2): qty 2

## 2021-02-28 MED ORDER — OXYCODONE 5 MG TABLET
5.0000 mg | ORAL_TABLET | ORAL | Status: AC
Start: 2021-02-28 — End: 2021-02-28
  Administered 2021-02-28: 5 mg via ORAL
  Filled 2021-02-28: qty 1

## 2021-02-28 MED ORDER — ATROPINE 0.1 MG/ML INJECTION SYRINGE
INJECTION | INTRAMUSCULAR | Status: AC
Start: 2021-02-28 — End: 2021-03-01
  Filled 2021-02-28: qty 10

## 2021-02-28 MED ORDER — LIDOCAINE 3.6 %-MENTHOL 1.25 % TOPICAL PATCH
1.0000 | MEDICATED_PATCH | Freq: Every day | CUTANEOUS | Status: DC
Start: 2021-02-28 — End: 2021-03-09
  Administered 2021-02-28 – 2021-03-05 (×6): 1 via TRANSDERMAL
  Administered 2021-03-06 – 2021-03-08 (×3): 0 via TRANSDERMAL
  Administered 2021-03-09: 1 via TRANSDERMAL
  Filled 2021-02-28 (×8): qty 1

## 2021-02-28 MED ORDER — VANCOMYCIN IV - INTERMITTENT DOSING
Freq: Every day | Status: AC | PRN
Start: 2021-02-28 — End: ?

## 2021-02-28 MED ORDER — FERROUS SULFATE 324 MG (65 MG IRON) TABLET,DELAYED RELEASE
324.0000 mg | DELAYED_RELEASE_TABLET | ORAL | Status: DC
Start: 2021-02-28 — End: 2021-02-28
  Filled 2021-02-28: qty 1

## 2021-02-28 MED ORDER — SENNOSIDES 8.6 MG-DOCUSATE SODIUM 50 MG TABLET
1.0000 | ORAL_TABLET | Freq: Two times a day (BID) | ORAL | Status: DC
Start: 2021-02-28 — End: 2021-03-09
  Administered 2021-02-28 – 2021-03-01 (×3): 0 via ORAL
  Administered 2021-03-01 – 2021-03-03 (×4): 1 via ORAL
  Administered 2021-03-03: 0 via ORAL
  Administered 2021-03-04: 1 via ORAL
  Administered 2021-03-04: 0 via ORAL
  Administered 2021-03-05 – 2021-03-06 (×4): 1 via ORAL
  Administered 2021-03-07: 0 via ORAL
  Administered 2021-03-07 – 2021-03-08 (×3): 1 via ORAL
  Administered 2021-03-09: 0 via ORAL
  Filled 2021-02-28 (×16): qty 1

## 2021-02-28 MED ORDER — VANCOMYCIN 10 GRAM INTRAVENOUS SOLUTION
750.0000 mg | Freq: Once | INTRAVENOUS | Status: AC
Start: 2021-02-28 — End: 2021-02-28
  Administered 2021-02-28: 750 mg via INTRAVENOUS
  Administered 2021-02-28: 0 mg via INTRAVENOUS
  Filled 2021-02-28: qty 7.5

## 2021-02-28 MED ORDER — DOXYCYCLINE MONOHYDRATE 100 MG TABLET
100.0000 mg | ORAL_TABLET | Freq: Two times a day (BID) | ORAL | Status: AC
Start: 2021-02-28 — End: 2021-03-04
  Administered 2021-02-28 – 2021-03-04 (×10): 100 mg via ORAL
  Filled 2021-02-28 (×11): qty 1

## 2021-02-28 MED ORDER — DEXTROSE 5% IN WATER (D5W) FLUSH BAG - 250 ML
INTRAVENOUS | Status: DC | PRN
Start: 2021-02-28 — End: 2021-03-09

## 2021-02-28 MED ORDER — HYDROCODONE 5 MG-ACETAMINOPHEN 325 MG TABLET
1.0000 | ORAL_TABLET | Freq: Three times a day (TID) | ORAL | Status: DC | PRN
Start: 2021-02-28 — End: 2021-02-28

## 2021-02-28 MED ORDER — SODIUM CHLORIDE 0.9 % INTRAVENOUS PIGGYBACK
1.0000 g | INTRAVENOUS | Status: AC
Start: 2021-03-01 — End: 2021-03-04
  Administered 2021-03-01: 11:00:00 0 g via INTRAVENOUS
  Administered 2021-03-01: 1 g via INTRAVENOUS
  Administered 2021-03-02: 0 g via INTRAVENOUS
  Administered 2021-03-02: 1 g via INTRAVENOUS
  Administered 2021-03-03: 0 g via INTRAVENOUS
  Administered 2021-03-03 – 2021-03-04 (×2): 1 g via INTRAVENOUS
  Administered 2021-03-04: 0 g via INTRAVENOUS
  Filled 2021-02-28 (×4): qty 10

## 2021-02-28 MED ORDER — DEXTROSE 50 % IN WATER (D50W) INTRAVENOUS SYRINGE
25.0000 g | INJECTION | INTRAVENOUS | Status: DC
Start: 2021-02-28 — End: 2021-02-28

## 2021-02-28 MED ORDER — SODIUM CHLORIDE 0.9 % INJECTION SOLUTION
2.0000 mL | INTRAVENOUS | Status: AC
Start: 2021-02-28 — End: 2021-03-02
  Administered 2021-03-02: 2 mL via INTRAVENOUS

## 2021-02-28 MED ORDER — OXYCODONE 5 MG TABLET
5.0000 mg | ORAL_TABLET | Freq: Four times a day (QID) | ORAL | Status: DC | PRN
Start: 2021-02-28 — End: 2021-03-09
  Administered 2021-02-28 – 2021-03-07 (×24): 5 mg via ORAL
  Administered 2021-03-07: 0 mg via ORAL
  Administered 2021-03-08 – 2021-03-09 (×5): 5 mg via ORAL
  Filled 2021-02-28 (×32): qty 1

## 2021-02-28 NOTE — Pharmacy (Signed)
[place] / Department of Pharmaceutical Services  Therapeutic Drug Monitoring: Vancomycin  02/28/2021      Patient name: Max [name], Max [name].  Date of Birth:  1970/05/24    Actual Weight:  Weight: 112 kg (247 lb 12.8 oz) (02/28/21 0658)     BMI:         Date RPh Current regimen (including mg/kg) Indication &  Organism AUC or trough based dosing Target Levels^ SCr (mg/dL) CrCl* (mL/min) Infectious Laboratory Markers (as applicable)   Measured level(s)   (mcg/mL) Calculated AUC (if AUC based monitoring) Plan & predicted AUC/trough if initial dosing (including when levels are due) Comments   2/19 jmw Vanc 1250 mg x1 ED OTF Empiric Tr 10-15 5.12 19.3 WBC:  Procal:  CRP:   Pt received 1250 mg x 1 at outside facitily. Dialysis pt who missed dialysis 2/18. Will start intermittent dosing and draw random level 24 hours post infusion.  May need to redose if dialysis is planned.    2/19 ELZ As above Empiric  trough Pre HD 20-25 iHD iHD N/a   HD session 2/19 after Vanc dose. Will give 750mg  x1 now. Next HD session planned for 2/20 - consider pre-HD Vanc level if to continue.                                                                                      Katherina Mires levels depends on dosing and monitoring method, AUC vs. trough based. For AUC based dosing units are mg*h/L. For trough based dosing units are mcg/mL.     *Creatinine clearance is estimated by using the Cockcroft-Gault equation for adult patients and the Carol Ada for pediatric patients.    The decision to discontinue vancomycin therapy will be determined by the primary service.  Please contact the pharmacist with any questions regarding this patient's medication regimen.

## 2021-02-28 NOTE — Progress Notes (Signed)
Transfer of Care Acceptance Note    51 yo male with h/o HTN, HLD, DM, CAD s/p multiple PCIs, HFpEF, PAD s/p stenting, ESRD (on dialysis), COPD, BKA, osteomyelitis, and substance use disorder who presented after missing dialysis with hyperkalemia, hypotension, and bradycardia requiring atropine and epinephrine drip, with subsequent resolution of hyperkalemia and improvement in mental status after dialysis while in MICU. Patient now deemed stable for transfer to stepdown status, not requiring pressors. Currently at baseline 3-4L O2 requirement. Concern for mild pulmonary edema and possible pneumonia, on broad spectrum antibiotics (vancomycin, cefepime and doxycycline).   Agree with assessment and plan per MICU progress note. Will continue dialysis while inpatient, Nephrology team following. Will consult case management tomorrow for transfer back to [facility] if continues to do well. Will monitor in the meantime, continue antibiotics and trend electrolytes and H&H.    Avel Peace, MD

## 2021-02-28 NOTE — Pharmacy (Addendum)
[place] / Department of Pharmaceutical Services  Therapeutic Drug Monitoring: Vancomycin  02/28/2021      Patient name: Max [name], Max [name].  Date of Birth:  1970-12-13    Actual Weight:        BMI:         Date RPh Current regimen (including mg/kg) Indication &  Organism AUC or trough based dosing Target Levels^ SCr (mg/dL) CrCl* (mL/min) Infectious Laboratory Markers (as applicable)   Measured level(s)   (mcg/mL) Calculated AUC (if AUC based monitoring) Plan & predicted AUC/trough if initial dosing (including when levels are due) Comments   2/19 jmw Vanc 1250 mg x1 ED OTF Empiric Tr 10-15 5.12 19.3 WBC:  Procal:  CRP:   Pt received 1250 mg x 1 at outside facitily. Dialysis pt who missed dialysis 2/18. Will start intermittent dosing and draw random level 24 hours post infusion.  May need to redose if dialysis is planned.                                                                                                    Katherina Mires levels depends on dosing and monitoring method, AUC vs. trough based. For AUC based dosing units are mg*h/L. For trough based dosing units are mcg/mL.     *Creatinine clearance is estimated by using the Cockcroft-Gault equation for adult patients and the Carol Ada for pediatric patients.    The decision to discontinue vancomycin therapy will be determined by the primary service.  Please contact the pharmacist with any questions regarding this patient's medication regimen.

## 2021-02-28 NOTE — H&P (Signed)
Medical Intensive Care Unit  HISTORY & PHYSICAL     Name: Max [name], Slowey., 51 y.o. male Date of Service:  02/28/2021   MRN  Date of Birth:  23-Aug-1970  [id number] Attending: Algis Liming, MD   PCP: Azzie Roup, MD Chief Complaint: had no chief complaint listed for this encounter.     Code Status: Prior Admitted for: Hyperkalemia       HISTORY OF PRESENTING ILLNESS:     Cadel Stairs. is a 51 y.o. 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 The patient treated for hyperkalemia and his potassium decreased from 6.5-6.1.  He received insulin, dextrose, calcium gluconate, bicarbonate and Lokelma.  He received atropine and epinephrine for bradycardia that improved his heart rate.  He received also IV fluid of 1 L and half in addition to cefepime vancomycin    Upon interviewing the patient he states that he has pian everywhere and he was receiving Norco at nursing home.  Stated that he has fatigue, shortness of breathing, lightheadedness so he went to the ED. he had cardiac stent placed 3 months ago at outside facility.  He is on warfarin for history of DVT. States he is not drinking alcohol or smoking.     MEDICAL HISTORY:     PAST MEDICAL & SURGICAL HISTORIES:   Past Medical History:   Diagnosis Date    Arthropathy     CKD (chronic kidney disease), stage III (CMS HCC) 08/25/2019    Congestive heart failure (CMS HCC)      Coronary artery disease     Diabetes mellitus, type 2 (CMS HCC)     Headache     HTN (hypertension)     Past Surgical History:   Procedure Laterality Date    AMPUTATION FOOT / TOE Left     CORONARY ARTERY ANGIOPLASTY      HX CATARACT REMOVAL Bilateral       HOME MEDICATIONS:  No outpatient medications have been marked as taking for the 02/28/21 encounter [facility] Encounter).      ALLERGIES:  He is allergic to ultram [tramadol], benadryl [diphenhydramine], and penicillin g benethamine.      FAMILY HISTORY:  His family history includes Cancer in his father; Coronary Artery Disease in his father and mother.   SOCIAL HISTORY:  He  reports no history of alcohol use. He  reports that he has never smoked. He has never used smokeless tobacco. He  reports no history of drug use.     ROS:      ROS as above      OBJECTIVE:    VITAL SIGNS:  Temperature: 37.3 C (99.1 F) BP (Non-Invasive): 121/68 Heart Rate: 58   Respiratory Rate: 18 SpO2: 97 %          INTAKE & OUTPUT:  Net Since Admission:      Intake/Output Summary (Last 24 hours) at 02/28/2021 0435  Last data filed at 02/28/2021 0300  Gross per 24 hour   Intake 6.83 ml   Output 100 ml   Net -93.17 ml     Date of Last Bowel Movement: 02/28/21         VENTILATOR SETTINGS:  Not on Ventilator        LINES & DRAINS:   Patient Lines/Drains/Airways Status       Active Line / Dialysis Catheter / Dialysis Graft / Drain / Airway / Wound       Name Placement date Placement time Site Days    Peripheral IV Right Median Cubital  (antecubital fossa) 02/27/21  2023  -- less than 1    Peripheral IV Left Median Cubital  (antecubital fossa) 02/27/21  2011  -- less than 1    Foley Catheter 02/28/21  0250  -- less than 1    Wound (Non-Surgical) Anterior;Right Foot 08/26/19  2054  -- 551                     Physical Exam  General: appears chronically ill and in  acute distress  Eyes: Conjunctiva clear, Pupils equal and round, Sclera non-icteric  HENT: Head atraumatic and normocephalic, Mouth  mucous membranes dry.   Neck: Trachea Midline   Lungs: decreased to auscultation bilaterally  Cardiovascular: Regular rate and rhythm. Bradycardia   Extremities: RIGHT BKA. Left LE with multiple wounds and mild edema   Skin: Skin warm and dry  Neurologic: Alert and oriented     LABS  IMAGING  MICROBIOLOGY  PATHOLOGY:     I have reviewed all of the recent labs, imaging, microbiology and pathology.    CBC Differential   Recent Labs     02/27/21  2115 02/28/21  0216 02/28/21  0410   WBC 11.9* 17.3*  --    HGB 7.4* 7.2* 7.0*   HCT 22.9* 23.1* 22.2*   PLTCNT 514* 516*  --     Recent Labs     02/27/21  2115 02/28/21  0216   PMNS 60 77   LYMPHOCYTES 24  --    MONOCYTES 14* 11   EOSINOPHIL 1  --    BASOPHILS 1 0  <0.10   PMNABS 7.20 13.14*   LYMPHSABS  --  1.96   MONOSABS  --  1.94*   EOSABS  --  <0.10      BMP LFTs   Recent Labs     02/27/21  2021 02/27/21  2329 02/28/21  0216 02/28/21  0223   SODIUM 130* 131* 130*  --    POTASSIUM 6.5* 6.1* 6.2*  --    CHLORIDE 97 99 97  --    CO2 18* 20* 19*  --    BUN 50* 52* 55*  --    CREATININE 5.25* 5.12* 4.92*  --    GLUCOSENF 251* 108 105  --    ANIONGAP 15* 12 14*  --    BUNCRRATIO 10 10 11   --    GFR 13* 13* 14*  --    CALCIUM 8.2* 8.6 8.4*  --    MAGNESIUM  --   --  1.4*  --    PHOSPHORUS  --   --  7.2*  --    ALBUMIN 2.0*  --   --  2.0*    Recent Labs     02/27/21  2043 02/28/21  0223   TOTALPROTEIN  --  7.1   ALBUMIN  --  2.0*   TOTBILIRUBIN  --  0.6  BILIRUBINCON  --  0.4   UROBILINOGEN 0.2  --    AST  --  37   ALT  --  10   ALKPHOS  --  226*      CoAgs Blood Gas:   Recent Labs     02/28/21  0316   PROTHROMTME 46.6*   INR 3.94*    Recent Labs     02/27/21  2021 02/27/21  2115 02/28/21  0223   FI02  --   --  44.0   PH 7.27* 7.39 7.32   PCO2 43 33 42   PO2 23* 120* 34*   BICARBONATE 19.4* 19.7* 20.5*   BASEDEFICIT 6.9* 4.6* 4.3*       Cardiac Markers Lipid Panel   Recent Labs     02/27/21  2021 02/27/21  2329 02/28/21  0223   TROPONINI 37* 35* 39*   BNP 812*  --  1,114*     No results found for this encounter   Urine Analysis Other Labs   Recent Labs     02/27/21  2043   APPEARANCE Clear   COLOR Yellow   SPECGRAVUR 1.020   GLUCOSE Negative   BILIRUBIN Small*   KETONES Trace*   PHURINE 5.5   PROTEIN 30*   UROBILINOGEN 0.2   NITRITE Positive*   LEUKOCYTES Negative   RBCS 3-5*   WBCS 3-5   BACTERIA Moderate*    Recent Labs     02/28/21  0223   CREAPROINFLA 108.7*   TSH 2.850        Recent Results (from the past [id number] hour(s))   CT BRAIN WO IV CONTRAST    Collection Time: 02/27/21 11:15 PM    Narrative    [name].    RADIOLOGIST: Otho Najjar, MD    CT BRAIN WO IV CONTRAST performed on 02/27/2021 11:15 PM    CLINICAL HISTORY: On warfarin evaluate for head bleed.  Confusion; Slow Heart Rate    TECHNIQUE:  Head CT without intravenous contrast.    COMPARISON: None.  # of known CTs in the past 12 months:  0   # of known Cardiac Nuclear Medicine Studies in the past 12 months:  0    FINDINGS:  There is no acute intracranial hemorrhage, mass effect, or evidence of large acute infarct.    Brain: Within normal limits for age    CSF Spaces: Normal     Sinuses/Mastoids:  Clear at visualized levels     Bones: Unremarkable        Impression    No acute intracranial process.        One or more dose reduction techniques were used (e.g., Automated exposure control, adjustment of the mA and/or kV according to patient size, use of iterative reconstruction technique).      Radiologist location ID: [id number] Results (from the past [id number] hour(s))   CT CHEST ABDOMEN PELVIS WO IV CONTRAST    Collection Time: 02/27/21 11:06 PM    Narrative    [name].    RADIOLOGIST: Otho Najjar, MD    CT CHEST ABDOMEN PELVIS WO IV CONTRAST performed on 02/27/2021 11:06 PM    CLINICAL HISTORY: On warfarin with hypotension in heart failure and kidney failure.  missed dialysis, on warfarin, hypotension, heart failure and kidney failure     TECHNIQUE:  Chest, abdomen and pelvis  CT without intravenous contrast.    COMPARISON:  None.  #  of known CTs in the past 12 months:  0   # of known Cardiac Nuclear Medicine Studies in the past 12 months:  0    FINDINGS:  CT CHEST:  Lung windows:  There are tiny bilateral pleural effusions suggested. In the posterior sulci there are focal bilateral right greater than left consolidative infiltrates that have the appearance of pneumonitis. This would be difficult to visualize on plain radiographs unless lateral films were obtained.    Mediastinum: There is coronary artery calcification. There is cardiomegaly. There is a central line extending into the SVC on the right.    There are scattered lymph nodes present that are not size significant.    Bone windows: No acute bony abnormality. Lower thoracic degenerative change.      Impression    Bilateral inflammatory appearing lower lobe infiltrates with tiny effusions.      CT ABDOMEN/PELVIS:  Noncontrast technique limits evaluation of the abdominal and pelvic viscera.    Abdomen: No focal abnormality of the unenhanced liver, spleen, or pancreas.    There appears to be a couple of tiny gallstones layering dependently. No gross evidence of cholecystitis.    The aorta is moderately calcified. At the level of the left renal hilum there is a 13 mm periaortic node. Smaller periaortic nodes are also demonstrated on the left and right.    There is anasarca present with fatty haziness mildly involving the intraperitoneal fat as well.    Kidneys: No acute renal abnormality. No obstruction. Calcification in the left and right renal pelvis appears to be vascular.    Pelvis: Bladder contains a Foley catheter and is empty. There is moderate pelvic fluid present that is nonspecific.    There is concentric rectal mucosa thickening (image [id number]), suggesting proctitis. Clinical correlation recommended.    Attention to the colon demonstrates a moderate amount of fecal material. Mucosa of the transverse and left colon is  slightly prominent, probably normal however. Early colitis is a consideration. There is a normal appendix which is just under the surface of the right anterior abdomen. The cecum is mobile and extends towards the center of the pelvis.    Small bowel contains moderate amount of fluid with some air-fluid levels. This suggests an ileus.    Bone windows: No acute bony abnormality. Schmorl's node defects at L3-4 and particular. Endplate degenerative change present at this level.    IMPRESSION:  1. Anasarca.  2. Possible proctitis.  3. Possible small bowel ileus.  4. Probable cholelythiasis.  5. Few prominent periaortic lymph nodes.  6. Other findings as above.    One or more dose reduction techniques were used (e.g., Automated exposure control, adjustment of the mA and/or kV according to patient size, use of iterative reconstruction technique).      Radiologist location ID: [id number]         ASSESSMENT:     Jj Enyeart. is a 51 y.o. 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 MICU for further eval and management of sepsis and electrolyte disturbance with EKG change after he missed dialysis      Active Hospital Problems    Diagnosis    Primary Problem: Hyperkalemia  PLAN:     Respiratory:  Acute hypoxic respiratory failure with increased O2 requirement   COPD  Bilateral pulmonary infiltrate concerning for pneumonia  HF exacerbation ?  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  Non-STEMI Most probably type 2 demand ischemia  Bradycardia with junctional rhythm status post atropine and epinephrine  History of DVT on warfarin last received yesterday  HTN  HLD  PAD s/p stenting   EKG low voltage sinus rhythm rate with right bundle branch block  Troponin trending  Hold home Coreg, lisinopril, Imdur ,aldactone,  Norvasc and ASA in setting of bradycardia , soft BP and electrolyte imbalance  INR 3.9 , hold warfarin, INR repeated  HR in low to high 50 , on tele   TTE ordered  Input-output monitoring    Neurologic:  Acute encephalopathy improved  Alert and Oriented x3  CT brain with no acute process     Renal:  End-stage renal disease on dialysis  Missed dialysis with hyperkalemia and altered mental status  Mild hyponatremia  Creatinine 4.9; baseline unknown   Strict I/O, maintain foley   Renally dose all medications and avoid Nephrotoxins   BLOOD GAS  Lab Results   Component Value Date    PH 7.32 02/28/2021    PCO2 42 02/28/2021    PO2 34 (L) 02/28/2021    BICARBONATE 20.5 (L) 02/28/2021     Nephro consulted and HD planned this morning      Hematology  Normocytic normochromic anemia  Reported recent blood transfusion  Iron deficiency anemia   Iron replacement started   HG 7.2   Cross and match in case transfusion needed  Pt denies that he has a GI bleeding      Infectious Diseases:  Sepsis Concerning bilateral pulmonary infiltration on the CT  Possible cholecystitis and proctitis on CT  UTI  WBC 17, CRP 100, Procal pending   Blood and urine cultures are pending     currently on low dose pressor    Vanc/cefepime/flagyl for broad spectrum coverage\     Endocrinologic:  Uncontrolled Diabetes mellitus  TSH pending   HGA1C 11.2, repeated pending      Psychologic/Social:   Only number provided was for friend, NPOA need to be clarified    There is  reported hx of Substance abuse but the pt denies   Not drinking EtOH or smoking   ___    Diet:  Diabetic renal diet   Fluids: received    Analgesia: Oxy once and tylenol    Sedation:  None  DVT/PE Prophylaxis:INR supratheraputic .SCD   Ulcer Prophylaxis:NA   Glycemic Control:  Conservative SSI Protocol  Bowel Regimen:  Miralax/Senokot   Consults: nEPHRO  Hardware (Lines, Drains, Foley, Tubes):  PIV and Foley Catheter and TCT   Activity:  Up w/ assistance and Up in chair TID w/ all meals  Therapy:  PT/OT    Family communication/Prognosis:  Only number provided was for friend, NPOA need to be clarified      Disposition Planning - TBD;  Christ Kick, MD    Late entry for 02/27/2021  Patient developed sepsis, electrolyte abnormality after missed dialysis. UTI, pneumonia, started on IVF, emergent dialysis performed. Clinically improving. I spend 78 Critical care minutes while the patient was in this condition providing fluids, pressor, drugs and oxygen.I saw and evaluated the patient. Laboratory, hemodynamic, respiratory support, and radiological data were also reviewed and discussed.   I reviewed the resident's note. I agree with the findings and plan of care as documented in the resident's note. Any exceptions/additions are noted above. The case was discussed with the patient's family and all the questions were answered.  Algis Liming MD  Pulmonary & Critical Care Medicine  Pager [phone]

## 2021-02-28 NOTE — Progress Notes (Signed)
[facility]                                                     MICU PROGRESS NOTE    Max [name], Max [name]., 51 y.o. male  Date of Admission:  02/28/2021  Date of service: 02/28/2021  Date of Birth:  [birth year]    Subjective: resting in bed. Underwent dialysis with improvement in labs. He is alert, awake. He is complaining of pain "everywehre," which is chronic. He wants Norco, and says that plain Tylenol will not help, which he refused to take earlier. Otherwise, he relates he missed dialysis on Friday because he wasn't feeling very well. He is refusing to eat a renal diet, and wants to have a regular diet.       Drips:   Current Facility-Administered Medications   Medication Dose Frequency Last Rate         Vital Signs:  Temp (24hrs) Max:37.3 C (47.6 F)      Systolic (54YTK), PTW:656 , Min:81 , CLE:751     Diastolic (70YFV), CBS:49, Min:51, Max:76    Temp  Avg: 36.6 C (97.8 F)  Min: 36.2 C (97.1 F)  Max: 37.3 C (99.1 F)  MAP (Non-Invasive)  Avg: 81.2 mmHG  Min: 66 mmHG  Max: 96 mmHG  Pulse  Avg: 67.4  Min: 34  Max: 97  Resp  Avg: 17.8  Min: 13  Max: 35  SpO2  Avg: 98.8 %  Min: 91 %  Max: 100 %       I/O:  I/O last 24 hours:      Intake/Output Summary (Last 24 hours) at 02/28/2021 1225  Last data filed at 02/28/2021 1100  Gross per 24 hour   Intake 105.83 ml   Output 376 ml   Net -270.17 ml     I/O current shift:  02/19 0700 - 02/19 1859  In: 99   Out: 240 [Urine:240]      Physical Exam:  General: well-nourished, well-developed, in no apparent distress.  Head: normocephalic, atraumatic.  Eyes: pupils equally round and reactive to light, extraocular muscles intact, sclareae non-icteric, conjunctivae clear.  Mouth/Throat: oropharynx clear, mucous membranes moist  Neck: trachea midline, no thyromegaly or lymphadenopathy  Cardiovascular: regualr rate and rhythm  Respiratory: clear to auscultation bilaterally  Abdominal: bowel sounds normal; abdomen soft,  non-tender to palpation.  Extremities: no edema. Has BKA.   Skin: warm and dry.  Neurological: awake, A&O x 3. Strength and sensation grossly intact.  Psychiatric: normal mood & affect. Thought process linear. Speech content normal.    Blood Gas Results:  Recent Labs     02/27/21  2021 02/27/21  2115 02/28/21  0223   FI02  --   --  44.0   PH 7.27* 7.39 7.32   PCO2 43 33 42   PO2 23* 120* 34*   BICARBONATE 19.4* 19.7* 20.5*   BASEDEFICIT 6.9* 4.6* 4.3*         Lines and Drains:  Patient Lines/Drains/Airways Status       Active Line / Dialysis Catheter / Dialysis Graft / Drain / Airway / Wound       Name Placement date Placement time Site Days    Peripheral IV Right Median Cubital  (antecubital fossa) 02/27/21  2023  -- less than 1    Peripheral  IV Left Median Cubital  (antecubital fossa) 02/27/21  2011  -- less than 1    Foley Catheter 02/28/21  0250  -- less than 1    Wound (Non-Surgical) Anterior;Right Knee 08/26/19  2054  -- 551    Wound (Non-Surgical) Left;Posterior Elbow 02/28/21  0300  -- less than 1                    Labs/Imaging:    I have reviewed all recent labs and imaging.       Assessment/Plan:  Active Hospital Problems    Diagnosis    Primary Problem: Hyperkalemia       Patient is a 51 y.o., male with a significant PMH of HTN, HLD, DM, CAD s/p multiple PCI x10, HFpEF, PAD s/p stenting, ESRD (on dialysis), COPD, BKA, osteomyelitis, and substance use disorder who presented after missing dialysis with hyperkalemia, hypotension, and bradycardia. He received atropine and epinephrine drip, and then underwent dialysis with resolution of hyperkalemia.     Respiratory:  Acute hypoxic respiratory failure with increased O2 requirement   COPD  Bilateral pulmonary infiltrate concerning for pneumonia  Concern for HF exacerbation   Mild bilateral pleural effusion   CXR : Mild pul congestion  Currently on nasal cannula   He is on home oxygen of 4 L at home  Albuterol Q4H  Broad antibiotics   Maintain O2 sats >88%      Cardiovascular:  CAD S/P multiple PCI (last one 3 months ago)  Heart failure 2/2 ICM  Non-STEMI Most probably type 2 demand ischemia  Bradycardia with junctional rhythm status post atropine and epinephrine - resolved  History of DVT on warfarin last received yesterday  HTN  HLD  PAD s/p stenting   Troponin trending  Hold home Coreg, lisinopril, Imdur ,aldactone,  Norvasc and ASA in setting of bradycardia , soft BP and electrolyte imbalance  INR 3.9 , hold warfarin, INR repeated  TTE ordered     Neurologic:  Acute encephalopathy improved  Alert and Oriented x3  CT brain with no acute process     Renal:  End-stage renal disease on dialysis  Missed dialysis with hyperkalemia and altered mental status  Mild hyponatremia  Creatinine 4.9; baseline unknown   Nephro consulted      Hematology  Normocytic normochromic anemia  Reported recent blood transfusion  Iron deficiency anemia   Cross and match in case transfusion needed  Pt denies that he has a GI bleeding      Infectious Diseases:  Sepsis Concerning bilateral pulmonary infiltration on the CT  Concern for urinary tract infection  WBC 17, CRP 100  UA with moderate bacteria  Blood and urine cultures are pending     Vanc/cefepime - follow cultures  MRSA swab positive      Endocrinologic:  Uncontrolled Diabetes mellitus  SSI      Psychologic/Social:   Only number provided was for friend, MPOA need to be clarified    There is reported hx of substance abuse but the pt denies   Not drinking EtOH or smoking          DVT/PE:   Anticoagulants (last 24 hours)       Date/Time Action Medication Dose    02/28/21 0624 Given    sodium citrate 4% injection syringe 2 Syringe            Analgesics:   Analgesics (last 24 hours)       Date/Time Action Medication Dose  02/28/21 0909 Given    HYDROcodone-acetaminophen (NORCO) 5-325 mg per tablet 1 Tablet    02/28/21 0356 Given    oxyCODONE (ROXICODONE) immediate release tablet 5 mg          Nutrition: MNT PROTOCOL FOR DIETITIAN  DIET  DIABETIC Calorie amount: CC 2000; Additional modifications/limitations: LOW PHOSPHORUS, 2 G POTASSIUM  Therapy: PT/OT evaluation  Sedation: None    Code status:  Full Code      Zachery A. Greathouse, MD      Patient developed sepsis, electrolyte abnormality after missed dialysis. UTI, pneumonia, started on IV antibiotics, emergent dialysis performed. Clinically improving. Tolerating po diet. I spend 38 Critical care minutes while the patient was in this condition providing fluids, pressor, drugs and oxygen.I saw and evaluated the patient. Laboratory, hemodynamic, respiratory support, and radiological data were also reviewed and discussed.   I reviewed the resident's note. I agree with the findings and plan of care as documented in the resident's note. Any exceptions/additions are noted above. The case was discussed with the patient's family and all the questions were answered.  Algis Liming MD  Pulmonary & Critical Care Medicine  Pager [phone]

## 2021-02-28 NOTE — Pharmacy (Signed)
Pharmacy Medication Reconciliation    Patient Name: Max [name], Max [name].  Date of Service: 02/28/2021  Date of Admission: 02/28/2021  Date of Birth: 1970/02/28  Length of Stay:   0 days   Service: MICU2      Transitions of Care:  Discharge Pharmacy services were discussed with the patient and the Meds to [facility] flowsheet and preferred pharmacy information were updated in EMR if applicable and able to assess with patient.    Information was collected from:  Patient and Pharmacy    Clarified Prior to Admission Medications:  Prior to Admission medications    Medication Sig Taking Resumed Y/N (RPh) Comments   amLODIPine (NORVASC) 10 mg Oral Tablet Take 1 Tablet (10 mg total) by mouth Once a day Yes       aspirin (ECOTRIN) 81 mg Oral Tablet, Delayed Release (E.C.) Take 1 Tablet (81 mg total) by mouth Once a day Yes       atorvastatin (LIPITOR) 80 mg Oral Tablet Take 1 Tablet (80 mg total) by mouth Every evening Yes       carvediloL (COREG) 25 mg Oral Tablet Take 1 Tablet (25 mg total) by mouth Twice daily with food Yes       cyclobenzaprine (FLEXERIL) 5 mg Oral Tablet Take 1 Tablet (5 mg total) by mouth Three times a day Yes       docusate sodium (COLACE) 100 mg Oral Capsule Take 1 Capsule (100 mg total) by mouth Twice daily Yes       DULoxetine (CYMBALTA DR) 60 mg Oral Capsule, Delayed Release(E.C.) Take 1 Capsule (60 mg total) by mouth Once a day Yes       esomeprazole magnesium (NEXIUM) 20 mg Oral Capsule, Delayed Release(E.C.) Take 1 Capsule (20 mg total) by mouth Every morning before breakfast Yes       ezetimibe (ZETIA) 10 mg Oral Tablet Take 1 Tablet (10 mg total) by mouth Every evening Yes       ferrous sulfate (FERATAB) 324 mg (65 mg iron) Oral Tablet, Delayed Release (E.C.) Take 1 Tablet (324 mg total) by mouth Yes       gabapentin (NEURONTIN) 400 mg Oral Capsule Take 1 Capsule (400 mg total) by mouth Three times a day Yes       HYDROcodone-acetaminophen (NORCO) 5-325 mg Oral Tablet Take 1 Tablet by mouth Every 8 hours  as needed for Pain Yes       insulin glargine 100 unit/mL Subcutaneous injection (vial) Inject 25 Units under the skin Twice daily - in morning and at bedtime Yes       isosorbide mononitrate (IMDUR) 60 mg Oral Tablet Sustained Release 24 hr Take 1 Tablet (60 mg total) by mouth Every morning Yes       lisinopriL (PRINIVIL) 5 mg Oral Tablet Take 1 Tablet (5 mg total) by mouth Once a day for 30 days     Not taking should be removed   midodrine (PROAMITINE) 5 mg Oral Tablet Take 1 Tablet (5 mg total) by mouth Three times a day Yes       ranolazine (RANEXA) 500 mg Oral Tablet Sustained Release 12 hr Take 1 Tablet (500 mg total) by mouth Twice daily Yes       sevelamer (RENAGEL) 800 mg Oral Tablet Take 1 Tablet (800 mg total) by mouth Three times daily with meals Yes       sodium zirconium cyclosilicate (LOKELMA) 10 gram Oral Powder in Packet Take 1 Packet (10 g total) by  mouth Once a day Yes       spironolactone (ALDACTONE) 50 mg Oral Tablet Take 1 Tablet (50 mg total) by mouth Every morning with breakfast Yes       sucralfate (CARAFATE) 1 gram Oral Tablet Take 1 Tablet (1 g total) by mouth Three times daily before meals Yes       ticagrelor (BRILINTA) 90 mg Oral Tablet Take 1 Tablet (90 mg total) by mouth Twice daily Yes       torsemide (DEMADEX) 20 mg Oral Tablet Take 1 Tablet (20 mg total) by mouth Twice daily Yes       Warfarin (COUMADIN) 4 mg Oral Tablet Take 1 Tablet (4 mg total) by mouth Once a day Yes           Did patient's home medication list require updates? Yes    Medications UPDATED on Prior to Admission Med List:  - Gabapentin 300 TID -> Gabapentin 400 TID  - Isosorbide mononitrate 60 mg: take 120 mg daily -> Isosorbide mononitrate 60 mg: take 60 mg daily    Medications ADDED to Prior to Admission Med List:  - Cyclobenzaprine 5 mg TID  - Docusate Sodium 100 mg BID  - Duloxetine 60 mg daily  - Esomeprazole Mag 20 mg every morning before breakfast  - Ezetimibe 10 mg daily  - Ferrous Sulfate 324 daily  -  Hydrocodone-acet 325-5 Q8H PRN  - Insulin Glargine 100 units/ml 25 units BID  - Midodrine 5 mg TID  - Ranolazine 500 mg BID  - Sevelamer 800 mg TID  - Sodium zirconium cyclosilicate 10 mg daily  - Sucralfate 1 gram Before meals  - Torsemide 20 mg BID  - Warfarin 4 mg QD    Allergies:    Allergies   Allergen Reactions   . Ultram [Tramadol] Rash   . Benadryl [Diphenhydramine]  Other Adverse Reaction (Add comment)     irritable   . Penicillin G Benethamine        Justin Hinish, PHARMACY INTERN    Did pharmacist make suggestions for medication reconciliation? No    Medications REMOVED from home medication list:  Lisinopril    Pharmacist Recommendations:  Resume home medications as clinically indicated. Agree with holding warfarin at this time as INR >3.5.

## 2021-02-28 NOTE — Nurses Notes (Signed)
Pt completed emergency dialysis [facility] . No fluid taking off today . Report given to MICU RN .

## 2021-02-28 NOTE — Consults (Signed)
[facility]   Section of Nephrology  ESRD Initial Consult Note    Max [name], Max [name]. 51 y.o. male  Date of Admission:  02/28/2021  Date of Birth:  1970/11/16  Date of service: 02/28/2021      REASON FOR CONSULT:Dialysis Management     CONSULTING PHYSICIAN:MICU     ASSESSMENT/RECOMMENDATIONS:    51 y.o. male with past medical history of CAD s/p stents(recent one 3 months ago)  , HFrEF ,PAD s/p stenting , HTN , DM , ESRD (M/W/F)started 3 months back , Rt BKA , COPD transferred from outside facility to MICU for management Hyperkalemia with EKG changes after he missed his HD session also Hypotension requiring pressor      Follows at [facility] and undergoes dialysis on M/W/F via Rt TCC    Outpatient orders: Needs to be verified with the Unit on Monday       Electrolytes-All within acceptable parameters.  Acid Base-Stable  Volume status-Euvolemic  CKD Mineral Bone-Ca,  phos on target.   Anemia-Stable   Antibiotics- none    Recs:  -s/p Emergent HD early AM 2/2 Hyperkalemia with EKG changes and brady , did not remove fluid since he is hypotensive   -Next session on 02/20   - Maintain MAP > 89mmHg to optimize hemodynamic status  - Avoid nephrotoxins, adjust meds to appropriate GFR  - Daily BMP, mag, phos  - Optimize nutrition   - Strict I/O      HISTORY OF PRESENT ILLNESS   Max [name]. is a 51 y.o. male with past medical history of CAD s/p stents(recent one 3 months ago)  , HFrEF ,PAD s/p stenting , HTN , DM , ESRD (M/W/F) , Rt BKA , COPD transferred from outside facility to MICU for management Hyperkalemia with EKG changes after he missed his HD session also Hypotension requiring pressors     He presented to outside facility with light headedness, fatigue and AMS and found to have hyperkalemia , bradycardia and hypotension   recived Atropine and Epi for bradycardia which improved his HR and recieved medical mangement for hyperkalemia and his potassium came from 6.5 to 6.2       Past History:  Past  Medical History:   Diagnosis Date    Arthropathy     CKD (chronic kidney disease), stage III (CMS [place]) 08/25/2019    Congestive heart failure (CMS [place])     Coronary artery disease     Diabetes mellitus, type 2 (CMS [facility])     Headache     HTN (hypertension)          Past Surgical History:   Procedure Laterality Date    AMPUTATION FOOT / TOE Left     CORONARY ARTERY ANGIOPLASTY      HX CATARACT REMOVAL Bilateral          No current outpatient medications on file.     Allergies   Allergen Reactions    Ultram [Tramadol] Rash    Benadryl [Diphenhydramine]  Other Adverse Reaction (Add comment)     irritable    Penicillin G Benethamine      Social/Family History:   Social History     Tobacco Use    Smoking status: Never    Smokeless tobacco: Never   Vaping Use    Vaping Use: Never used   Substance Use Topics    Alcohol use: Never    Drug use: Never     Family Medical History:  Problem Relation (Age of Onset)    Cancer Father    Coronary Artery Disease Mother, Father            Inpatient Medications:  acetaminophen (TYLENOL) tablet, 650 mg, Oral, Q4H PRN  atropine 0.1 mg/mL injection ---Cabinet Override, , ,   cefepime (MAXIPIME) 1 g in NS 100 mL IVPB minibag, 1 g, Intravenous, Q12H  D5W 250 mL flush bag, , Intravenous, Q15 Min PRN  EPINEPHrine (ADRENALIN) 1 mg/mL injection ---Cabinet Override, , ,   EPINEPHrine (ADRENALIN) 15 mg in NS 250 mL infusion, 0.02 mcg/kg/min, Intravenous, Continuous  metroNIDAZOLE (FLAGYL) tablet, 500 mg, Oral, 2x/day  NS 250 mL flush bag, , Intravenous, Q15 Min PRN  NS flush syringe, 2-6 mL, Intracatheter, Q8HRS  NS flush syringe, 2-6 mL, Intracatheter, Q1 MIN PRN  perflutren lipid microspheres (DEFINITY) 1.3 mL in NS 10 mL (tot vol) injection, 2 mL, Intravenous, Give in Cardiology  polyethylene glycol (MIRALAX) oral packet, 17 g, Oral, Daily  sennosides-docusate sodium (SENOKOT-S) 8.6-50mg  per tablet, 1 Tablet, Oral, 2x/day  SSIP insulin lispro 100 units/mL injection, 0-12 Units,  Subcutaneous, Q4H PRN  Vancomycin IV - Pharmacist to Dose per Protocol, , Does not apply, Daily PRN  Vancomycin IV Intermittent Dosing, , Does not apply, Daily PRN      Objective:  Filed Vitals:    02/28/21 0300 02/28/21 0400 02/28/21 0500 02/28/21 0600   BP: 121/68 (!) 95/51 124/70 (!) 150/76   Pulse: 58 48 85 90   Resp: 18 15 17 16    Temp: 37.3 C (99.1 F)      SpO2: 97% 100% 99% 98%     General :on 3L NC (baseline)  Lungs: crackles+   [id number]   Access: rt TCC   Rt BKA     I&O:  02/18 0700 - 02/19 0659  In: 6.83 [I.V.:6.83]  Out: 136 [Urine:136]     Labs:  Recent Labs     02/27/21  2021 02/27/21  2329 02/28/21  0216   SODIUM 130* 131* 130*   POTASSIUM 6.5* 6.1* 6.2*   CHLORIDE 97 99 97   CO2 18* 20* 19*   BUN 50* 52* 55*   CREATININE 5.25* 5.12* 4.92*   GFR 13* 13* 14*   ANIONGAP 15* 12 14*     Recent Labs     02/27/21  2021 02/27/21  2329 02/28/21  0216 02/28/21  0223   CALCIUM 8.2* 8.6 8.4*  --    ALBUMIN 2.0*  --   --  2.0*   MAGNESIUM  --   --  1.4*  --    PHOSPHORUS  --   --  7.2*  --      Recent Labs     02/27/21  2115 02/28/21  0216 02/28/21  0410   WBC 11.9* 17.3*  --    HGB 7.4* 7.2* 7.0*   HCT 22.9* 23.1* 22.2*   PLTCNT 514* 516*  --      Recent Labs     02/27/21  2021 02/27/21  2115 02/28/21  0223   FI02  --   --  44.0   PH 7.27* 7.39 7.32   PCO2 43 33 42   PO2 23* 120* 34*   BICARBONATE 19.4* 19.7* 20.5*           Thank you for the consult.  Will continue to follow.   Please call with any questions.      Lynwood Dawley, MD 02/28/2021 07:47  PGY V, Nephrology Fellow  [facility], Section of Nephrology     I saw and examined the patient. I was present and participated in the development of the treatment plan. I agree with the findings and plan of care as documented in Dr. Evelina Dun note.  Any exceptions/ additions are edited/noted.         Stevenson Clinch, MD

## 2021-02-28 NOTE — ED Nurses Note (Signed)
Report given to RN Aubree at [facility].

## 2021-02-28 NOTE — Nurses Notes (Signed)
Pt arrived to MICU 19 via flight team, pt on 6LNC and on epi gtt. MICU service called to bedside, primary RN at bedside. Vitals filed to flow sheets.

## 2021-02-28 NOTE — ED Nurses Note (Addendum)
00:11 [facility] called accepted to MICU Bed 19  Report call to [phone]  00:12 Med Com called for weather check  00:20 Medi Vac one accepted 25 Minute ETA

## 2021-02-28 NOTE — ED Nurses Note (Signed)
Flight crew here to transport patient. Report given to flight team. Pt to be discharged with them at this time.

## 2021-03-01 ENCOUNTER — Inpatient Hospital Stay (HOSPITAL_COMMUNITY): Payer: Medicare (Managed Care)

## 2021-03-01 DIAGNOSIS — I252 Old myocardial infarction: Secondary | ICD-10-CM

## 2021-03-01 DIAGNOSIS — D649 Anemia, unspecified: Secondary | ICD-10-CM

## 2021-03-01 DIAGNOSIS — I498 Other specified cardiac arrhythmias: Secondary | ICD-10-CM

## 2021-03-01 DIAGNOSIS — M19012 Primary osteoarthritis, left shoulder: Secondary | ICD-10-CM

## 2021-03-01 DIAGNOSIS — Z89511 Acquired absence of right leg below knee: Secondary | ICD-10-CM

## 2021-03-01 DIAGNOSIS — M19022 Primary osteoarthritis, left elbow: Secondary | ICD-10-CM

## 2021-03-01 DIAGNOSIS — I120 Hypertensive chronic kidney disease with stage 5 chronic kidney disease or end stage renal disease: Secondary | ICD-10-CM

## 2021-03-01 DIAGNOSIS — E1165 Type 2 diabetes mellitus with hyperglycemia: Secondary | ICD-10-CM

## 2021-03-01 LAB — BASIC METABOLIC PANEL
ANION GAP: 11 mmol/L (ref 4–13)
ANION GAP: 10 mmol/L (ref 4–13)
ANION GAP: 7 mmol/L (ref 4–13)
ANION GAP: 6 mmol/L (ref 4–13)
BUN/CREA RATIO: 13 (ref 6–22)
BUN/CREA RATIO: 11 (ref 6–22)
BUN/CREA RATIO: 10 (ref 6–22)
BUN/CREA RATIO: 10 (ref 6–22)
BUN: 52 mg/dL — ABNORMAL HIGH (ref 8–25)
BUN: 50 mg/dL — ABNORMAL HIGH (ref 8–25)
BUN: 25 mg/dL (ref 8–25)
BUN: 27 mg/dL — ABNORMAL HIGH (ref 8–25)
CALCIUM: 8.4 mg/dL — ABNORMAL LOW (ref 8.5–10.0)
CALCIUM: 8.7 mg/dL (ref 8.5–10.0)
CALCIUM: 8.5 mg/dL (ref 8.5–10.0)
CALCIUM: 8 mg/dL — ABNORMAL LOW (ref 8.5–10.0)
CHLORIDE: 97 mmol/L (ref 96–111)
CHLORIDE: 96 mmol/L (ref 96–111)
CHLORIDE: 99 mmol/L (ref 96–111)
CHLORIDE: 100 mmol/L (ref 96–111)
CO2 TOTAL: 20 mmol/L — ABNORMAL LOW (ref 22–30)
CO2 TOTAL: 21 mmol/L — ABNORMAL LOW (ref 22–30)
CO2 TOTAL: 25 mmol/L (ref 22–30)
CO2 TOTAL: 24 mmol/L (ref 22–30)
CREATININE: 4.12 mg/dL — ABNORMAL HIGH (ref 0.75–1.35)
CREATININE: 4.45 mg/dL — ABNORMAL HIGH (ref 0.75–1.35)
CREATININE: 2.53 mg/dL — ABNORMAL HIGH (ref 0.75–1.35)
CREATININE: 2.59 mg/dL — ABNORMAL HIGH (ref 0.75–1.35)
ESTIMATED GFR: 17 mL/min/BSA — ABNORMAL LOW (ref >=60)
ESTIMATED GFR: 15 mL/min/BSA — ABNORMAL LOW (ref >=60)
ESTIMATED GFR: 30 mL/min/BSA — ABNORMAL LOW (ref >=60)
ESTIMATED GFR: 30 mL/min/BSA — ABNORMAL LOW (ref >=60)
GLUCOSE: 151 mg/dL — ABNORMAL HIGH (ref 65–125)
GLUCOSE: 107 mg/dL (ref 65–125)
GLUCOSE: 150 mg/dL — ABNORMAL HIGH (ref 65–125)
GLUCOSE: 205 mg/dL — ABNORMAL HIGH (ref 65–125)
POTASSIUM: 5.8 mmol/L — ABNORMAL HIGH (ref 3.5–5.1)
POTASSIUM: 5.6 mmol/L — ABNORMAL HIGH (ref 3.5–5.1)
POTASSIUM: 4 mmol/L (ref 3.5–5.1)
POTASSIUM: 4.2 mmol/L (ref 3.5–5.1)
SODIUM: 128 mmol/L — ABNORMAL LOW (ref 136–145)
SODIUM: 127 mmol/L — ABNORMAL LOW (ref 136–145)
SODIUM: 131 mmol/L — ABNORMAL LOW (ref 136–145)
SODIUM: 130 mmol/L — ABNORMAL LOW (ref 136–145)

## 2021-03-01 LAB — BPAM PACKED CELL ORDER: UNIT DIVISION: 0

## 2021-03-01 LAB — MANUAL DIFF AND MORPHOLOGY-SYSMEX
BASOPHIL #: <0.1 10*3/uL (ref <=0.20)
BASOPHIL %: 0 %
EOSINOPHIL #: 0.58 10*3/uL — ABNORMAL HIGH (ref <=0.50)
EOSINOPHIL %: 4 %
LYMPHOCYTE #: 1.31 10*3/uL (ref 1.00–4.80)
LYMPHOCYTE %: 9 %
MONOCYTE #: 1.46 10*3/uL — ABNORMAL HIGH (ref 0.20–1.10)
MONOCYTE %: 10 %
NEUTROPHIL #: 11.24 10*3/uL — ABNORMAL HIGH (ref 1.50–7.70)
NEUTROPHIL %: 77 %
RBC MORPHOLOGY: NORMAL

## 2021-03-01 LAB — ECG 12-LEAD
Atrial Rate: 92 {beats}/min
Atrial Rate: 56 {beats}/min
Calculated P Axis: 18 degrees
Calculated P Axis: 65 degrees
Calculated R Axis: 125 degrees
Calculated R Axis: 116 degrees
Calculated T Axis: -35 degrees
Calculated T Axis: -54 degrees
PR Interval: 178 ms
PR Interval: 140 ms
QRS Duration: 142 ms
QRS Duration: 132 ms
QT Interval: 414 ms
QT Interval: 464 ms
QTC Calculation: 511 ms
QTC Calculation: 447 ms
Ventricular rate: 92 {beats}/min
Ventricular rate: 56 {beats}/min

## 2021-03-01 LAB — POC BLOOD GLUCOSE (RESULTS)
GLUCOSE, POC: 123 mg/dl — ABNORMAL HIGH (ref 70–105)
GLUCOSE, POC: 130 mg/dl — ABNORMAL HIGH (ref 70–105)
GLUCOSE, POC: 184 mg/dl — ABNORMAL HIGH (ref 70–105)
GLUCOSE, POC: 185 mg/dl — ABNORMAL HIGH (ref 70–105)
GLUCOSE, POC: 185 mg/dl — ABNORMAL HIGH (ref 70–105)
GLUCOSE, POC: 211 mg/dl — ABNORMAL HIGH (ref 70–105)

## 2021-03-01 LAB — PHOSPHORUS: PHOSPHORUS: 6 mg/dL — ABNORMAL HIGH (ref 2.4–4.7)

## 2021-03-01 LAB — CBC WITH DIFF
HCT: 22.2 % — ABNORMAL LOW (ref 38.9–52.0)
HGB: 7.2 g/dL — ABNORMAL LOW (ref 13.4–17.5)
MCH: 27.5 pg (ref 26.0–32.0)
MCHC: 32.4 g/dL (ref 31.0–35.5)
MCV: 84.7 fL (ref 78.0–100.0)
MPV: 9.5 fL (ref 8.7–12.5)
PLATELETS: 461 10*3/uL — ABNORMAL HIGH (ref 150–400)
RBC: 2.62 10*6/uL — ABNORMAL LOW (ref 4.50–6.10)
RDW-CV: 15.8 % — ABNORMAL HIGH (ref 11.5–15.5)
WBC: 14.6 10*3/uL — ABNORMAL HIGH (ref 3.7–11.0)

## 2021-03-01 LAB — MAGNESIUM: MAGNESIUM: 1.9 mg/dL (ref 1.8–2.6)

## 2021-03-01 LAB — TROPONIN-I: TROPONIN I: 77 ng/L — ABNORMAL HIGH (ref 0–30)

## 2021-03-01 LAB — PT/INR
INR: 1.6 — ABNORMAL HIGH (ref 0.80–1.20)
PROTHROMBIN TIME: 18.6 seconds — ABNORMAL HIGH (ref 9.1–13.9)

## 2021-03-01 LAB — PROCALCITONIN REFLEX 24HR: PROCALCITONIN: 5.61 ng/mL — ABNORMAL HIGH (ref <0.50)

## 2021-03-01 MED ORDER — SODIUM CITRATE 4 % (3 ML) INTRA-CATHETER INJECTION SYRINGE
2.0000 | INJECTION | Status: AC
Start: 2021-03-01 — End: 2021-03-01
  Administered 2021-03-01: 2
  Filled 2021-03-01 (×2): qty 6

## 2021-03-01 MED ORDER — AMLODIPINE 5 MG TABLET
10.0000 mg | ORAL_TABLET | Freq: Every day | ORAL | Status: DC
Start: 2021-03-01 — End: 2021-03-09
  Administered 2021-03-03: 0 mg via ORAL

## 2021-03-01 MED ORDER — PANTOPRAZOLE 40 MG TABLET,DELAYED RELEASE
40.0000 mg | DELAYED_RELEASE_TABLET | Freq: Every morning | ORAL | Status: DC
Start: 2021-03-02 — End: 2021-03-09
  Administered 2021-03-02 – 2021-03-09 (×8): 40 mg via ORAL
  Filled 2021-03-01 (×9): qty 1

## 2021-03-01 MED ORDER — HEPARIN (PORCINE) 5,000 UNIT/ML INJECTION SOLUTION
5000.0000 [IU] | Freq: Three times a day (TID) | INTRAMUSCULAR | Status: DC
Start: 2021-03-01 — End: 2021-03-01

## 2021-03-01 MED ORDER — ISOSORBIDE MONONITRATE ER 60 MG TABLET,EXTENDED RELEASE 24 HR
60.0000 mg | ORAL_TABLET | Freq: Every day | ORAL | Status: DC
Start: 2021-03-02 — End: 2021-03-09
  Administered 2021-03-02 – 2021-03-09 (×8): 60 mg via ORAL
  Filled 2021-03-01 (×9): qty 1

## 2021-03-01 MED ORDER — CARVEDILOL 25 MG TABLET
25.0000 mg | ORAL_TABLET | Freq: Two times a day (BID) | ORAL | Status: DC
Start: 2021-03-01 — End: 2021-03-09
  Administered 2021-03-02 – 2021-03-05 (×6): 25 mg via ORAL
  Administered 2021-03-05: 0 mg via ORAL
  Administered 2021-03-06 – 2021-03-08 (×6): 25 mg via ORAL
  Administered 2021-03-09: 0 mg via ORAL
  Filled 2021-03-01 (×12): qty 1

## 2021-03-01 MED ORDER — WARFARIN - PHARMACIST TO DOSE PER PROTOCOL
Freq: Every day | Status: DC | PRN
Start: 2021-03-01 — End: 2021-03-09

## 2021-03-01 MED ORDER — EZETIMIBE 10 MG TABLET
10.0000 mg | ORAL_TABLET | Freq: Every evening | ORAL | Status: DC
Start: 2021-03-01 — End: 2021-03-09
  Administered 2021-03-01 – 2021-03-08 (×8): 10 mg via ORAL
  Filled 2021-03-01 (×9): qty 1

## 2021-03-01 MED ORDER — DEXTROSE 50 % IN WATER (D50W) INTRAVENOUS SYRINGE
25.0000 g | INJECTION | INTRAVENOUS | Status: AC
Start: 2021-03-01 — End: 2021-03-01
  Administered 2021-03-01: 50 mL via INTRAVENOUS
  Filled 2021-03-01: qty 50

## 2021-03-01 MED ORDER — LISINOPRIL 5 MG TABLET
5.0000 mg | ORAL_TABLET | Freq: Every day | ORAL | Status: DC
Start: 2021-03-01 — End: 2021-03-09
  Administered 2021-03-03: 0 mg via ORAL

## 2021-03-01 MED ORDER — WARFARIN 2 MG TABLET
4.0000 mg | ORAL_TABLET | Freq: Once | ORAL | Status: AC
Start: 2021-03-01 — End: 2021-03-01
  Administered 2021-03-01: 4 mg via ORAL
  Filled 2021-03-01: qty 2

## 2021-03-01 MED ORDER — TICAGRELOR 90 MG TABLET
90.0000 mg | ORAL_TABLET | Freq: Two times a day (BID) | ORAL | Status: DC
Start: 2021-03-01 — End: 2021-03-01

## 2021-03-01 MED ORDER — ASPIRIN 81 MG CHEWABLE TABLET
81.0000 mg | CHEWABLE_TABLET | Freq: Every day | ORAL | Status: DC
Start: 2021-03-01 — End: 2021-03-09
  Administered 2021-03-01 – 2021-03-09 (×9): 81 mg via ORAL
  Filled 2021-03-01 (×9): qty 1

## 2021-03-01 MED ORDER — SODIUM CHLORIDE 0.9 % INTRAVENOUS SOLUTION
120.0000 mg | Freq: Once | INTRAVENOUS | Status: DC
Start: 2021-03-01 — End: 2021-03-01
  Administered 2021-03-01: 0 mg via INTRAVENOUS
  Filled 2021-03-01: qty 12

## 2021-03-01 MED ORDER — CALCIUM GLUCONATE 100 MG/ML (10 %) INTRAVENOUS SOLUTION
2000.0000 mg | INTRAVENOUS | Status: DC
Start: 2021-03-01 — End: 2021-03-01
  Filled 2021-03-01 (×2): qty 10

## 2021-03-01 MED ORDER — VANCOMYCIN 10 GRAM INTRAVENOUS SOLUTION
1500.0000 mg | Freq: Once | INTRAVENOUS | Status: AC
Start: 2021-03-01 — End: 2021-03-01
  Administered 2021-03-01: 0 mg via INTRAVENOUS
  Administered 2021-03-01: 1500 mg via INTRAVENOUS
  Filled 2021-03-01: qty 15

## 2021-03-01 MED ORDER — CALCIUM GLUCONATE 100 MG/ML (10 %) INTRAVENOUS SOLUTION
1000.0000 mg | INTRAVENOUS | Status: AC
Start: 2021-03-01 — End: 2021-03-01
  Administered 2021-03-01: 1000 mg via INTRAVENOUS

## 2021-03-01 MED ORDER — ATORVASTATIN 40 MG TABLET
80.0000 mg | ORAL_TABLET | Freq: Every evening | ORAL | Status: DC
Start: 2021-03-01 — End: 2021-03-09
  Administered 2021-03-01 – 2021-03-08 (×8): 80 mg via ORAL
  Filled 2021-03-01 (×8): qty 2

## 2021-03-01 MED ORDER — GABAPENTIN 400 MG CAPSULE
400.0000 mg | ORAL_CAPSULE | Freq: Three times a day (TID) | ORAL | Status: DC
Start: 2021-03-01 — End: 2021-03-09
  Administered 2021-03-01 – 2021-03-04 (×11): 400 mg via ORAL
  Administered 2021-03-05: 0 mg via ORAL
  Administered 2021-03-05 – 2021-03-09 (×13): 400 mg via ORAL
  Filled 2021-03-01 (×25): qty 1

## 2021-03-01 MED ORDER — DULOXETINE 60 MG CAPSULE,DELAYED RELEASE
60.0000 mg | DELAYED_RELEASE_CAPSULE | Freq: Every day | ORAL | Status: DC
Start: 2021-03-01 — End: 2021-03-09
  Administered 2021-03-01 – 2021-03-09 (×9): 60 mg via ORAL
  Filled 2021-03-01 (×10): qty 1

## 2021-03-01 MED ORDER — VANCOMYCIN 10 GRAM INTRAVENOUS SOLUTION
1500.0000 mg | INTRAVENOUS | Status: DC
Start: 2021-03-03 — End: 2021-03-04
  Administered 2021-03-03: 1500 mg via INTRAVENOUS
  Administered 2021-03-03: 0 mg via INTRAVENOUS
  Filled 2021-03-01: qty 15

## 2021-03-01 MED ORDER — CYCLOBENZAPRINE 10 MG TABLET
5.0000 mg | ORAL_TABLET | Freq: Three times a day (TID) | ORAL | Status: DC | PRN
Start: 2021-03-01 — End: 2021-03-09

## 2021-03-01 MED ORDER — FERROUS SULFATE 324 MG (65 MG IRON) TABLET,DELAYED RELEASE
324.0000 mg | DELAYED_RELEASE_TABLET | Freq: Every morning | ORAL | Status: DC
Start: 2021-03-02 — End: 2021-03-09
  Administered 2021-03-02 – 2021-03-09 (×8): 324 mg via ORAL
  Filled 2021-03-01 (×11): qty 1

## 2021-03-01 MED ORDER — TORSEMIDE 20 MG TABLET
20.0000 mg | ORAL_TABLET | Freq: Two times a day (BID) | ORAL | Status: DC
Start: 2021-03-01 — End: 2021-03-09
  Administered 2021-03-01 – 2021-03-09 (×16): 20 mg via ORAL
  Filled 2021-03-01 (×19): qty 1

## 2021-03-01 MED ORDER — ONDANSETRON HCL (PF) 4 MG/2 ML INJECTION SOLUTION
4.0000 mg | Freq: Three times a day (TID) | INTRAMUSCULAR | Status: DC | PRN
Start: 2021-03-01 — End: 2021-03-09

## 2021-03-01 MED ORDER — INSULIN REGULAR HUMAN 100 UNIT/ML INJECTION - CHARGE BY DOSE
5.0000 [IU] | INTRAMUSCULAR | Status: AC
Start: 2021-03-01 — End: 2021-03-01
  Administered 2021-03-01: 5 [IU] via INTRAVENOUS
  Filled 2021-03-01: qty 15

## 2021-03-01 MED ORDER — SEVELAMER CARBONATE 800 MG TABLET
800.0000 mg | ORAL_TABLET | Freq: Three times a day (TID) | ORAL | Status: DC
Start: 2021-03-01 — End: 2021-03-09
  Administered 2021-03-01 – 2021-03-03 (×5): 800 mg via ORAL
  Administered 2021-03-03: 0 mg via ORAL
  Administered 2021-03-03 – 2021-03-05 (×6): 800 mg via ORAL
  Administered 2021-03-05: 0 mg via ORAL
  Administered 2021-03-06 – 2021-03-08 (×7): 800 mg via ORAL
  Administered 2021-03-08 (×2): 0 mg via ORAL
  Administered 2021-03-09 (×2): 800 mg via ORAL
  Filled 2021-03-01 (×21): qty 1

## 2021-03-01 MED ORDER — RANOLAZINE ER 500 MG TABLET,EXTENDED RELEASE,12 HR
500.0000 mg | ORAL_TABLET | Freq: Two times a day (BID) | ORAL | Status: DC
Start: 2021-03-01 — End: 2021-03-09
  Administered 2021-03-01 – 2021-03-09 (×16): 500 mg via ORAL
  Filled 2021-03-01 (×20): qty 1

## 2021-03-01 NOTE — Consults (Signed)
[facility]   Section of Nephrology  ESRD Follow-up Consult Note    Max [name], Max [name]. 51 y.o. male  Date of Admission:  02/28/2021  Date of Birth:  11/06/1970  Date of service: 03/01/2021      REASON FOR CONSULT:Dialysis Management     CONSULTING PHYSICIAN:MICU     ASSESSMENT/RECOMMENDATIONS:    51 y.o. male with past medical history of CAD s/p stents(recent one 3 months ago), HFrEF ,PAD s/p stenting , HTN , DM , ESRD (M/W/F)started 3 months back , Rt BKA , COPD transferred from outside facility to MICU for management of Hyperkalemia with EKG changes after he missed his HD session.    ESRD on HD MWF  Follows at [facility] and undergoes dialysis on M/W/F via Rt TCC    Electrolytes- All within acceptable parameters.  Acid Base- Stable  Volume status- Euvolemic  CKD Mineral Bone- Ca,  phos on target.   Anemia- Stable   Antibiotics- none    Recs:  - s/p Emergent HD on 2/19 early AM 2/2 Hyperkalemia with EKG changes  - HD today as per outpatient MWF schedule. 4hrs, 2-2.5L UF as tolerated.  - Maintain MAP > 71mmHg to optimize hemodynamic status  - Avoid nephrotoxins, adjust meds to appropriate GFR  - Daily BMP, mag, phos  - Optimize nutrition   - Strict I/O      SUBJECTIVE:  Seen during HD treatment in ICU. Tolerating well w/o complaints.     Inpatient Medications:  cefepime (MAXIPIME) 1 g in NS 100 mL IVPB minibag, 1 g, Intravenous, Q24H  D5W 250 mL flush bag, , Intravenous, Q15 Min PRN  doxycycline tablet, 100 mg, Oral, 2x/day  furosemide (LASIX) 120 mg in NS 25 mL IVPB, 120 mg, Intravenous, Once  lidocaine-menthol (LIDOPATCH) 3.6%-1.25% patch, 1 Patch, Transdermal, Daily  NS 250 mL flush bag, , Intravenous, Q15 Min PRN  NS flush syringe, 2-6 mL, Intracatheter, Q8HRS  NS flush syringe, 2-6 mL, Intracatheter, Q1 MIN PRN  ondansetron (ZOFRAN) 2 mg/mL injection, 4 mg, Intravenous, Q8H PRN  oxyCODONE (ROXICODONE) immediate release tablet, 5 mg, Oral, Q6H PRN  perflutren lipid microspheres (DEFINITY) 1.3 mL  in NS 10 mL (tot vol) injection, 2 mL, Intravenous, Give in Cardiology  polyethylene glycol (MIRALAX) oral packet, 17 g, Oral, Daily  premixed hemodialysate (NATURALYTE) 3.43 L with potassium chloride 2 mEq/L, calcium chloride 2.5 mEq/L, , Hemodialysis, Give in Dialysis  sennosides-docusate sodium (SENOKOT-S) 8.6-50mg  per tablet, 1 Tablet, Oral, 2x/day  sodium citrate 4% injection syringe, 2 Syringe, Intracatheter, Give in Dialysis  SSIP insulin lispro 100 units/mL injection, 0-12 Units, Subcutaneous, Q4H PRN  Vancomycin IV - Pharmacist to Dose per Protocol, , Does not apply, Daily PRN  Vancomycin IV Intermittent Dosing, , Does not apply, Daily PRN      Objective:  Filed Vitals:    03/01/21 0800 03/01/21 0900 03/01/21 1000 03/01/21 1100   BP: 111/72 118/78 130/75 116/78   Pulse: 63 99 100 99   Resp: (!) 23 (!) 24 19 14    Temp: 37.4 C (99.3 F)      SpO2: 99% 98% 98% 93%     General :on 3L NC (baseline)  Lungs: crackles+   [id number]   Access: rt TCC   Rt BKA     I&O:  02/19 0700 - 02/20 0659  In: 390 [Blood:292]  Out: [place] [Urine:690]     Labs:  Recent Labs     02/28/21  0216 02/28/21  0747 03/01/21  0453  03/01/21  0745   SODIUM 130* 133* 128* 127*   POTASSIUM 6.2* 4.5 5.8* 5.6*   CHLORIDE 97 101 97 96   CO2 19* 20* 20* 21*   BUN 55* 37* 52* 50*   CREATININE 4.92* 3.52* 4.12* 4.45*   GFR 14* 21* 17* 15*   ANIONGAP 14* 12 11 10      Recent Labs     02/27/21  2021 02/27/21  2329 02/28/21  0216 02/28/21  0223 02/28/21  0747 03/01/21  0453 03/01/21  0745   CALCIUM 8.2*   < > 8.4*  --  8.2* 8.4* 8.7   ALBUMIN 2.0*  --   --  2.0*  --   --   --    MAGNESIUM  --   --  1.4*  --  1.4* 1.9  --    PHOSPHORUS  --   --  7.2*  --  5.4* 6.0*  --     < > = values in this interval not displayed.     Recent Labs     02/27/21  2115 02/28/21  0216 02/28/21  0216 02/28/21  0410 02/28/21  1736 03/01/21  0453   WBC 11.9* 17.3*  --   --   --  14.6*   HGB 7.4* 7.2*   < > 7.0* 6.8* 7.2*   HCT 22.9* 23.1*   < > 22.2* 21.8* 22.2*   PLTCNT 514*  516*  --   --   --  461*    < > = values in this interval not displayed.     No results found for this encounter    Thank you for the consult.  Will continue to follow.   Please call with any questions.    Raina Mina, MD   Nephrology Fellow, PGY-V   Nephrology Consult Team [place]    I saw and examined the patient, personally performed the critical or key portions of the service and discussed patient management with the Nephrology team including the resident,fellow and ancillary staff. I reviewed Nephrology Fellow's note and agree with the history, findings and plan of care.  Exceptions are edited/noted above.  Laboratory, hemodynamic and radiological data were also reviewed and discussed.     Leafy Half MD  Assistant Professor  Section of Nephrology  Department of Medicine  [facility]      Leafy Half, [place]  03/02/2021, 09:01

## 2021-03-01 NOTE — Progress Notes (Signed)
Interval Update Note  03/01/21 06:18    Paged for patient with bradycardia.  On arrival, patient c/o nausea, headache; denies chest pain.  HR 56, sinus rhythm.  EKG ordered & reviewed at bedside.  Sinus bradycardia.  Concern for hyperkalemia.    Ordered calcium gluconate, insulin, dextrose.  Patient planned for dialysis later today.  Patient with listed ESRD, but has Foley in with some urine (bloody) in bag.  Will add furosemide also.    Neysa Bonito, MD  03/01/21 07:38  Assistant Professor, Internal Medicine  [facility]

## 2021-03-01 NOTE — Nurses Notes (Addendum)
Patient complaining of nausea and dry heaving around 0400. Hospitalist 9 paged. Patient then started becoming more lethargic and heart rate dropped to 50's and 60s. Hospitalist 9 paged again. Doctor came to bedside and orders obtained.

## 2021-03-01 NOTE — Anticoag (Signed)
[place] / [place]    Pharmacist Consult for Warfarin Dosing- Progress Note      Max [name]. is an 51 y.o. patient on warfarin for VTE to target a goal INR of 2 - 3. Warfarin was a continuation from prior to admission    Prior to Admission Regimen: Warfarin 4 mg daily  Pertinent details on prior to admission dosing schedule (missed/held doses, etc.): INR supratherapeutic on admission (3.9)    Most Recent Inpatient Labs  Lab Results   Component Value Date    HGB 7.2 (L) 03/01/2021    HCT 22.2 (L) 03/01/2021    ALBUMIN 2.0 (L) 02/28/2021    RBC 2.62 (L) 03/01/2021    PROTHROMTME 18.6 (H) 03/01/2021    APTT 34.6 08/26/2019    TOTBILIRUBIN 0.6 02/28/2021    AST 37 02/28/2021    ALT 10 02/28/2021    PLTCNT 461 (H) 03/01/2021       Current Diet Order:  MNT PROTOCOL FOR DIETITIAN  DIET DIABETIC Calorie amount: CC 2000; Additional modifications/limitations: LOW PHOSPHORUS, 2 G POTASSIUM  DIETARY ORAL SUPPLEMENTS Oral Supplements with tray: Ensure Enlive-Chocolate; LUNCH/DINNER; 1 Each       Inpatient Warfarin Dosing:     Date  INR  Warfarin    Dose   Bridging    Regimen              (if applicable)   Concurrent        Antiplatelet(s)  Drug Interactions  Comments   03/01/21 1.6 4 mg none                                              Assessment & Plan    . The INR today is SUBtherapeutic  . Give warfarin 4 mg tonight.  . Recheck INR in AM  . Monitor for signs/symptoms of bleeding  . Plan for outpatient management (if known) per primary team or care management:     To be determined           Please contact team pharmacist with any questions about warfarin management.

## 2021-03-01 NOTE — Nurses Notes (Signed)
Patient dialysis complete, 2.5L removed. Tolerated well. Dressing changed. Report given to bedside RN.

## 2021-03-01 NOTE — Pharmacy (Signed)
[place] / [facility]  Therapeutic Drug Monitoring: Vancomycin  03/01/2021      Patient name: Max [name], Max [name].  Date of Birth:  1970/06/20    Actual Weight:  Weight: 96.5 kg (212 lb 11.9 oz) (03/01/21 1215)     BMI:         Date RPh Current regimen (including mg/kg) Indication &  Organism AUC or trough based dosing Target Levels^ SCr (mg/dL) CrCl* (mL/min) Infectious Laboratory Markers (as applicable)   Measured level(s)   (mcg/mL) Calculated AUC (if AUC based monitoring) Plan & predicted AUC/trough if initial dosing (including when levels are due) Comments   2/19 jmw Vanc 1250 mg x1 ED OTF Empiric Tr 10-15 5.12 19.3 WBC:  Procal:  CRP:   Pt received 1250 mg x 1 at outside facitily. Dialysis pt who missed dialysis 2/18. Will start intermittent dosing and draw random level 24 hours post infusion.  May need to redose if dialysis is planned.    2/19 ELZ As above Empiric  trough Pre HD 20-25 iHD iHD N/a   HD session 2/19 after Vanc dose. Will give 750mg  x1 now. Next HD session planned for 2/20 - consider pre-HD Vanc level if to continue.     2/20 LM Intermittent Empiric trough Pre HD 20-25  HD    Will schedule Vanc 1500mg  with HD MWF. Random torugh prior to session Friday.                                                                      Katherina Mires levels depends on dosing and monitoring method, AUC vs. trough based. For AUC based dosing units are mg*h/L. For trough based dosing units are mcg/mL.     *Creatinine clearance is estimated by using the Cockcroft-Gault equation for adult patients and the Carol Ada for pediatric patients.    The decision to discontinue vancomycin therapy will be determined by the primary service.  Please contact the pharmacist with any questions regarding this patient's medication regimen.

## 2021-03-01 NOTE — Care Management Notes (Signed)
[facility] Management Initial Evaluation    Patient Name: Max [name].  Date of Birth: 18-Dec-1970  Sex: male  Date/Time of Admission: 02/28/2021  2:08 AM  Room/Bed: 19/A  Payor: Mcarthur Rossetti MEDICARE / Plan: Mcarthur Rossetti MEDICARE / Product Type: MEDICARE MC /   Primary Care Providers:  Azzie Roup, MD, MD (General)    Pharmacy Info:   Preferred [facility] [phone] - [place], [place] - [address]    [place] [facility] [address]    Phone: [phone] Fax: [phone]    Hours: Not open 24 hours          Emergency Contact Info:   Extended Emergency Contact Information  Primary Emergency Contact: Beene,Cynthia  Address: [address] [place], [place]  Mobile Phone: [phone]  Relation: Friend  Secondary Emergency Contact: Henlawson,Lisa  Mobile Phone: [phone]  Relation: Other  Preferred language: English  Interpreter needed? No    History:   Max [name]. is a 51 y.o., male, admitted Hyperkalemia     Height/Weight:   / 96.5 kg (212 lb 11.9 oz)     LOS: 1 day   Admitting Diagnosis: Hyperkalemia [E87.5]    Assessment:      03/01/21 1623   Assessment Details   Assessment Type Admission   Date of Care Management Update 03/01/21   Date of Next DCP Update 03/04/21   Readmission   Is this a readmission? No   Insurance Information/Type   Insurance type Medicare   Employment/Financial   Patient has Prescription Coverage?  Yes  Geneticist, molecular)        Name of Insurance Coverage for Medications [employer]   Financial Concerns none   Living Environment   Select an age group to open "lives with" row.  Adult   Lives With alone   Living Arrangements mobile home   Able to Return to Prior Arrangements other (see comments)  (to be determined)   IEP and/or 504 Plan? No   Home Safety   Home Assessment: No Problems Identified   Home Accessibility stairs to enter home   Custody and Legal Status   Do you have a court appointed  guardian/conservator? No   Are you an emancipated minor? No   Custody Issues? No   Paternity Affidavit Requested? No   Care Management Plan   Discharge Planning Status initial meeting   Projected Discharge Date 03/04/21   CM will evaluate for rehabilitation potential yes   Patient choice offered to patient/family no   Form for patient choice reviewed/signed and on chart no   Patient aware of possible cost for ambulance transport?  No   Discharge Needs Assessment   Outpatient/Agency/Support Group Needs skilled nursing facility   Equipment Currently Used at Home wheelchair;oxygen   Equipment Needed After Discharge other (see comments)  (to be determined)   Discharge Facility/Level of Care Needs SNF Return (Medicare certified)(code 3)   Transportation Available ambulance   Referral Information   Admission Type inpatient   Address Verified verified-no changes   Arrived From acute hospital, other   [place]   [facility])   ADVANCE DIRECTIVES   Does the Patient have an Advance Directive? Yes, Patient Does Have Advance Directive for Healthcare Treatment   Copy of Advance Directives in Chart? 6   Patient Requests Assistance in Having  Advance Directive Notarized. N/A   LAY CAREGIVER    Appointed Lay Caregiver? I Decline   Home Main Entrance   Stair Railings, Main Entrance railings safe and in good condition   Number of Stairs, Main Entrance one       MSW completed assessment at pt bedside.  Pt stated he has been at [facility] for the past three weeks and goal is to eventually go back home.  Pt lives alone in mobile home with one step to enter. Pt stated Max [name] has copy of MPOA there.  Pt's physical address is [place].  Pt agreed to return to facility at discharge. Pt stated he was not on oxygen prior to SNF placement .  Service stated pt will be discharge ready back to SNF soon.  MSW explained Max [name] would be needed for return to facility. Pt goes to [facility] M,W,F .   MSW spoke with [facility] [phone] who will get back with MSW regarding PASS. Pt's admit date to facility was 02/14/21 per Max [name].   Max [name] stated auth can be started when PT eval is completed.        Pt with right BKA. Pt on NC. IV ABX. Continue HD.       Discharge Plan:  SNF Return (Medicare certified) (code 3)      The patient will continue to be evaluated for developing discharge needs.     Case Manager: Vertell Limber  Phone: [phone]

## 2021-03-01 NOTE — Progress Notes (Signed)
[facility]  Medicine Progress Note    [name].  Date of service: 03/01/2021  Date of Admission:  02/28/2021    Hospital Day:  LOS: 1 day      Subjective: Overnight, patient noted to be somnolent. This morning, he was noted to be bradycardic with hyperkalemia. He received calcium gluconate, insulin and dextrose. He was dialyzed this am and was evaluated while being dialyzed. He reported feeling ok, denied having any significant bothersome symptoms.     Vital Signs:  Temp  Avg: 36.7 C (98.1 F)  Min: 36.4 C (97.5 F)  Max: 37.4 C (99.3 F)    Pulse  Avg: 88.2  Min: 63  Max: 100 BP  Min: 103/76  Max: 133/67   Resp  Avg: 19.3  Min: 14  Max: 24 SpO2  Avg: 98.1 %  Min: 93 %  Max: 100 %          Input/Output    Intake/Output Summary (Last 24 hours) at 03/01/2021 1433  Last data filed at 03/01/2021 1215  Gross per 24 hour   Intake 649 ml   Output 3115 ml   Net -2466 ml    I/O last shift:  02/20 0700 - 02/20 1859  In: 99   Out: 2665 [Urine:165; Dialysis:2500]   cefepime (MAXIPIME) 1 g in NS 100 mL IVPB minibag, 1 g, Intravenous, Q24H  D5W 250 mL flush bag, , Intravenous, Q15 Min PRN  doxycycline tablet, 100 mg, Oral, 2x/day  furosemide (LASIX) 120 mg in NS 25 mL IVPB, 120 mg, Intravenous, Once  lidocaine-menthol (LIDOPATCH) 3.6%-1.25% patch, 1 Patch, Transdermal, Daily  NS 250 mL flush bag, , Intravenous, Q15 Min PRN  NS flush syringe, 2-6 mL, Intracatheter, Q8HRS  NS flush syringe, 2-6 mL, Intracatheter, Q1 MIN PRN  ondansetron (ZOFRAN) 2 mg/mL injection, 4 mg, Intravenous, Q8H PRN  oxyCODONE (ROXICODONE) immediate release tablet, 5 mg, Oral, Q6H PRN  perflutren lipid microspheres (DEFINITY) 1.3 mL in NS 10 mL (tot vol) injection, 2 mL, Intravenous, Give in Cardiology  polyethylene glycol (MIRALAX) oral packet, 17 g, Oral, Daily  sennosides-docusate sodium (SENOKOT-S) 8.6-50mg  per tablet, 1 Tablet, Oral, 2x/day  SSIP insulin lispro 100 units/mL injection, 0-12 Units, Subcutaneous, Q4H PRN  vancomycin  (VANCOCIN) 1,500 mg in NS 500 mL IVPB, 1,500 mg, Intravenous, Once  [START ON 03/03/2021] vancomycin (VANCOCIN) 1,500 mg in NS 500 mL IVPB, 1,500 mg, Intravenous, Every MO, WE, and FR Give in Dialysis  Vancomycin IV - Pharmacist to Dose per Protocol, , Does not apply, Daily PRN    Physical Exam:  Constitutional: appears chronically ill, appears stated age and no distress  Eyes: Conjunctiva clear.  ENT: ENMT without erythema or injection, mucous membranes moist.  Neck: supple, symmetrical, trachea midline  Respiratory: Clear to auscultation bilaterally.   Cardiovascular: regular rate and rhythm, S1, S2 normal, no murmur, click, rub or gallop  Gastrointestinal: Soft, non-tender, Bowel sounds normal  Musculoskeletal: R BKA, FROM all extremities, no significant LE edema  Integumentary:  Skin warm and dry and No rashes  Neurologic: Grossly normal, mildly somnolent  Psychiatric: Normal    Labs:  CBC Results Differential Results   Recent Results (from the past 30 hour(s))   CBC WITH DIFF    Collection Time: 03/01/21  4:53 AM   Result Value    WBC 14.6 (H)    HGB 7.2 (L)    HCT 22.2 (L)    PLATELETS 461 (H)   H & H  Collection Time: 02/28/21  5:36 PM   Result Value    HGB 6.8 (LL)    HCT 21.8 (L)    Recent Results (from the past 30 hour(s))   MANUAL DIFF AND MORPHOLOGY-SYSMEX    Collection Time: 03/01/21  4:53 AM   Result Value    NEUTROPHIL % 77    LYMPHOCYTE %  9    MONOCYTE % 10    EOSINOPHIL % 4    BASOPHIL % 0    BASOPHIL # <0.10   CBC WITH DIFF    Collection Time: 03/01/21  4:53 AM   Result Value    WBC 14.6 (H)      BMP Results Other Chemistries Results   Results for orders placed or performed during the hospital encounter of 02/28/21 (from the past 30 hour(s))   BASIC METABOLIC PANEL - Q7RFF X 3 (TO START  AFTER THE STAT & REPEAT IN 1 HR)    Collection Time: 03/01/21 12:57 PM   Result Value    SODIUM 131 (L)    POTASSIUM 4.0    CHLORIDE 99    CO2 TOTAL 25    GLUCOSE 150 (H)    BUN 25    CREATININE 2.53 (H)     Recent Results (from the past 30 hour(s))   MAGNESIUM    Collection Time: 03/01/21  4:53 AM   Result Value    MAGNESIUM 1.9   PHOSPHORUS    Collection Time: 03/01/21  4:53 AM   Result Value    PHOSPHORUS 6.0 (H)      Liver/Pancreas Enzyme Results Liver Function Results   No results found for this or any previous visit (from the past 30 hour(s)). No results found for this or any previous visit (from the past 30 hour(s)).   Cardiac Results Coags Results   Results for orders placed or performed during the hospital encounter of 02/28/21 (from the past 30 hour(s))   TROPONIN-I    Collection Time: 03/01/21  8:42 AM   Result Value    TROPONIN I 77 (H)    Recent Results (from the past 30 hour(s))   PT/INR    Collection Time: 03/01/21  2:17 PM   Result Value    PROTHROMBIN TIME 18.6 (H)    INR 1.60 (H)        Radiology:  Reveiwed    PT/OT: Yes    Consults: Nephrology    Assessment/ Plan:     51 yo male with a significant PMH of HTN, HLD, DM, CAD s/p multiple PCI x10, HFpEF, PAD s/p stenting, ESRD (on dialysis), COPD, BKA, osteomyelitis, and substance use disorder who presented after missing dialysis with hyperkalemia, hypotension, and bradycardia. He received atropine and epinephrine drip while in ICU, and then underwent emergent dialysis with resolution of hyperkalemia and bradycardia.     Acute on chronic hypoxemic respiratory failure, likely due to pulmonary edema due to missed dialysis  H/o COPD Bilateral pulmonary infiltrate concerning for pneumonia Concern for HF exacerbation given elevated BNP  Mild bilateral pleural effusion  - CXR: Mild pul congestion  - Currentlyon nasal cannula at his baseline of 4L O2  - continue Albuterol Q4h  - continue broad spectrum antibiotics  - wean O2 as tolerated, maintain O2 sats >88%  - restart home torsemide    Bradycardia with junctional rhythm status post atropine and epinephrine (resolved), likely due to hyperkalemia  CAD S/Pmultiple PCI (last one 3 months ago) 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  Non-STEMI Most probably  type 2 demand ischemia  HTN  HLD  PAD s/p stenting  - Troponindowntrended  - continue home aspirin  - per patient he's not on brilinta anymore, and last cardiac stent was >1 yr ago, will hold brilinta for now, to be addressed by outpatient cardiologist after discharge  - Hold home Coreg, lisinopril, aldactone, norvasc and ASA in setting of bradycardia, soft BP and electrolyte imbalance  - can start Imdur today as BP improved  - TTE ordered, will f/u results    History of DVT   - on warfarin  - INR 3.9 yesterday, warfarin held  - INR subtherapeutic at 1.6 today, will redose warfarin tonight  - will hold off heparin bridge given ongoing hematuria    Acute encephalopathy, likely due to uremia in setting of missed dialysis, cannot r/o delirium sue to sepsis and hospitalization  - currently alert and Oriented x3  - CT brain with no acute process  - monitor mental status  - delirium precautions    End-stage renal disease on dialysis  Missed dialysis with hyperkalemia and altered mental status  Mild hyponatremia  - Nephro following, appreciate recs  - continue HD MWF  - monitor BMP    Normocytic normochromic anemia, likely anemia of chronic disease due to ESRD, possibly compounded by ongoing hematuria and iron deficiency  - Iron deficiency anemia   - trend H&H  - transfuse PRBC prn, goal Hb>7    Sepsis, likely due to pneumonia given bilateral pulmonary infiltrates on CT chest, UA negative for UTI  - possible component of aspiration due to AMS  - continue vancomycin, cefepime and doxycycline for now  - MRSA nares positive  - blood cultures with no growth till date  - urine strep and legionella negative  - procal elevated to 5.61    UncontrolledDiabetes mellitus  - continue SSI Protocol   - will dose glargine tomorrow based on insulin requirement from today    DVT/PE Prophylaxis: Heparin    Disposition Planning: Likely back to the SNF, pending PT/OT eval     On 03/01/2021 I  spent a total visit time of 55 minutes. Time included review of tests and ordering tests, obtaining/reviewing history, examining the patient, communicating with consultants, documenting clinical information and counseling the patient and/or family regarding the diagnosis and management plan and coordination of care involved services directly related to patient care.    FOLLOW UP NOTE LEVEL 3 (TOTAL TIME > 50 MINUTES) ([id number])    Avel Peace, MD

## 2021-03-01 NOTE — Pharmacy (Deleted)
[place] / [facility] Pharm[facility] Services  Therapeutic Drug Monitoring: Vancomycin  03/01/2021      Patient name: Max [name], Max [name].  Date of Birth:  1970-05-17    Actual Weight:  Weight: 96.5 kg (212 lb 11.9 oz) (03/01/21 1215)     BMI:         Date RPh Current regimen (including mg/kg) Indication &  Organism AUC or trough based dosing Target Levels^ SCr (mg/dL) CrCl* (mL/min) Infectious Laboratory Markers (as applicable)   Measured level(s)   (mcg/mL) Calculated AUC (if AUC based monitoring) Plan & predicted AUC/trough if initial dosing (including when levels are due) Comments   2/19 jmw Vanc 1250 mg x1 ED OTF Empiric Tr 10-15 5.12 19.3 WBC:  Procal:  CRP:   Pt received 1250 mg x 1 at outside facitily. Dialysis pt who missed dialysis 2/18. Will start intermittent dosing and draw random level 24 hours post infusion.  May need to redose if dialysis is planned.    2/19 ELZ As above Empiric  trough Pre HD 20-25 iHD iHD N/a   HD session 2/19 after Vanc dose. Will give 750mg  x1 now. Next HD session planned for 2/20 - consider pre-HD Vanc level if to continue.     2/20 LM Intermittent Empiric trough Pre HD 20-25  HD    Will schedule Vanc 750mg  with HD MWF. Random torugh prior to session Friday.                                                                      Katherina Mires levels depends on dosing and monitoring method, AUC vs. trough based. For AUC based dosing units are mg*h/L. For trough based dosing units are mcg/mL.     *Creatinine clearance is estimated by using the Cockcroft-Gault equation for adult patients and the Carol Ada for pediatric patients.    The decision to discontinue vancomycin therapy will be determined by the primary service.  Please contact the pharmacist with any questions regarding this patient's medication regimen.

## 2021-03-02 ENCOUNTER — Encounter (HOSPITAL_COMMUNITY): Payer: Self-pay | Admitting: PULMONARY DISEASE

## 2021-03-02 ENCOUNTER — Inpatient Hospital Stay (HOSPITAL_COMMUNITY): Payer: Medicare (Managed Care)

## 2021-03-02 DIAGNOSIS — A419 Sepsis, unspecified organism: Principal | ICD-10-CM

## 2021-03-02 DIAGNOSIS — J9621 Acute and chronic respiratory failure with hypoxia: Secondary | ICD-10-CM

## 2021-03-02 DIAGNOSIS — R001 Bradycardia, unspecified: Secondary | ICD-10-CM

## 2021-03-02 DIAGNOSIS — R918 Other nonspecific abnormal finding of lung field: Secondary | ICD-10-CM

## 2021-03-02 DIAGNOSIS — I517 Cardiomegaly: Secondary | ICD-10-CM

## 2021-03-02 DIAGNOSIS — I499 Cardiac arrhythmia, unspecified: Secondary | ICD-10-CM

## 2021-03-02 DIAGNOSIS — I2510 Atherosclerotic heart disease of native coronary artery without angina pectoris: Secondary | ICD-10-CM

## 2021-03-02 DIAGNOSIS — E1122 Type 2 diabetes mellitus with diabetic chronic kidney disease: Secondary | ICD-10-CM

## 2021-03-02 DIAGNOSIS — I440 Atrioventricular block, first degree: Secondary | ICD-10-CM

## 2021-03-02 DIAGNOSIS — I132 Hypertensive heart and chronic kidney disease with heart failure and with stage 5 chronic kidney disease, or end stage renal disease: Secondary | ICD-10-CM

## 2021-03-02 DIAGNOSIS — I4510 Unspecified right bundle-branch block: Secondary | ICD-10-CM

## 2021-03-02 DIAGNOSIS — Z7901 Long term (current) use of anticoagulants: Secondary | ICD-10-CM

## 2021-03-02 DIAGNOSIS — Z86718 Personal history of other venous thrombosis and embolism: Secondary | ICD-10-CM

## 2021-03-02 DIAGNOSIS — E875 Hyperkalemia: Secondary | ICD-10-CM

## 2021-03-02 DIAGNOSIS — E785 Hyperlipidemia, unspecified: Secondary | ICD-10-CM

## 2021-03-02 DIAGNOSIS — I5189 Other ill-defined heart diseases: Secondary | ICD-10-CM

## 2021-03-02 DIAGNOSIS — J90 Pleural effusion, not elsewhere classified: Secondary | ICD-10-CM

## 2021-03-02 DIAGNOSIS — I5030 Unspecified diastolic (congestive) heart failure: Secondary | ICD-10-CM

## 2021-03-02 DIAGNOSIS — G9340 Encephalopathy, unspecified: Secondary | ICD-10-CM

## 2021-03-02 DIAGNOSIS — J449 Chronic obstructive pulmonary disease, unspecified: Secondary | ICD-10-CM

## 2021-03-02 DIAGNOSIS — R9431 Abnormal electrocardiogram [ECG] [EKG]: Secondary | ICD-10-CM

## 2021-03-02 DIAGNOSIS — I5032 Chronic diastolic (congestive) heart failure: Secondary | ICD-10-CM

## 2021-03-02 DIAGNOSIS — I214 Non-ST elevation (NSTEMI) myocardial infarction: Secondary | ICD-10-CM

## 2021-03-02 DIAGNOSIS — D509 Iron deficiency anemia, unspecified: Secondary | ICD-10-CM

## 2021-03-02 DIAGNOSIS — Z794 Long term (current) use of insulin: Secondary | ICD-10-CM

## 2021-03-02 DIAGNOSIS — E871 Hypo-osmolality and hyponatremia: Secondary | ICD-10-CM

## 2021-03-02 DIAGNOSIS — Z7982 Long term (current) use of aspirin: Secondary | ICD-10-CM

## 2021-03-02 DIAGNOSIS — Z95828 Presence of other vascular implants and grafts: Secondary | ICD-10-CM

## 2021-03-02 LAB — TRANSTHORACIC ECHOCARDIOGRAM - ADULT
Aortic Valve Area by Continuity of Peak Velocity: 2.32 cm2
Aortic Valve Area by Continuity of VTI: 2.2 cm2
FS: 28 percent (ref 28–44)
IVS: 1.16 cm — AB (ref 0.6–1.1)
LV Diastolic Volume Index: 136.8 milliliter
LV Diastolic Volume Index: 170.1 milliliter
LV Diastolic Volume Index: 185.7 milliliter
LV Diastolic Volume Index: 201.7 milliliter
LV Systolic Volume Index: 63.6 milliliter
LV Systolic Volume Index: 76.7 milliliter
LV Systolic Volume Index: 83.1 milliliter
LV Systolic Volume Index: 90.3 milliliter
LVIDD: 5.33 cm (ref 3.5–6.0)
LVIDS: 3.84 cm (ref 2.1–4.0)
LVOT diameter: 2.03 cm
LVOT stroke volume: 48.34 milliliter
LVPWD: 1.3 cm
Left Atrium Antero-posterior Dimension: 5.31 cm
MV Deceleration Time: 173.6 ms
MV Peak A Vel: 33.47 cm/s
MV Peak E Vel: 112.1 cm/s
MV VTI: 27.8 cm
MV mean gradient post stress: 68.92 cm/s
MV mean gradient post stress: 68.92 cm/s
MV mean gradient: 2.36 mmHg
MV peak gradient: 6.08 mmHg
MV valve area by continuity eq: 1.74 cm2
MV valve area by continuity eq: 1.74 cm2
Mitral Valve Max Velocity: 123.31 cm/s
TR VELOCITY: 227.37 cm/s
TR VELOCITY: 227.37 cm/s

## 2021-03-02 LAB — CBC WITH DIFF
BASOPHIL #: <0.1 10*3/uL (ref <=0.20)
BASOPHIL %: 0 %
EOSINOPHIL #: 0.25 10*3/uL (ref <=0.50)
EOSINOPHIL %: 2 %
HCT: 22.9 % — ABNORMAL LOW (ref 38.9–52.0)
HGB: 7.2 g/dL — ABNORMAL LOW (ref 13.4–17.5)
IMMATURE GRANULOCYTE #: <0.1 10*3/uL (ref <0.10)
IMMATURE GRANULOCYTE %: 0 % (ref 0–1)
LYMPHOCYTE #: 1.81 10*3/uL (ref 1.00–4.80)
LYMPHOCYTE %: 16 %
MCH: 28 pg (ref 26.0–32.0)
MCHC: 31.4 g/dL (ref 31.0–35.5)
MCV: 89.1 fL (ref 78.0–100.0)
MONOCYTE #: 1.98 10*3/uL — ABNORMAL HIGH (ref 0.20–1.10)
MONOCYTE %: 17 %
MPV: 9.3 fL (ref 8.7–12.5)
NEUTROPHIL #: 7.31 10*3/uL (ref 1.50–7.70)
NEUTROPHIL %: 65 %
PLATELETS: 386 10*3/uL (ref 150–400)
RBC: 2.57 10*6/uL — ABNORMAL LOW (ref 4.50–6.10)
RDW-CV: 16 % — ABNORMAL HIGH (ref 11.5–15.5)
WBC: 11.5 10*3/uL — ABNORMAL HIGH (ref 3.7–11.0)

## 2021-03-02 LAB — CBC
HCT: 21.7 % — ABNORMAL LOW (ref 38.9–52.0)
HGB: 6.8 g/dL — CL (ref 13.4–17.5)
MCH: 27.2 pg (ref 26.0–32.0)
MCHC: 31.3 g/dL (ref 31.0–35.5)
MCV: 86.8 fL (ref 78.0–100.0)
MPV: 9.4 fL (ref 8.7–12.5)
PLATELETS: 412 10*3/uL — ABNORMAL HIGH (ref 150–400)
RBC: 2.5 10*6/uL — ABNORMAL LOW (ref 4.50–6.10)
RDW-CV: 15.9 % — ABNORMAL HIGH (ref 11.5–15.5)
WBC: 11 10*3/uL (ref 3.7–11.0)

## 2021-03-02 LAB — BPAM PACKED CELL ORDER: UNIT DIVISION: 0

## 2021-03-02 LAB — BASIC METABOLIC PANEL
ANION GAP: 11 mmol/L (ref 4–13)
BUN/CREA RATIO: 12 (ref 6–22)
BUN: 35 mg/dL — ABNORMAL HIGH (ref 8–25)
CALCIUM: 8.5 mg/dL (ref 8.5–10.0)
CHLORIDE: 102 mmol/L (ref 96–111)
CO2 TOTAL: 21 mmol/L — ABNORMAL LOW (ref 22–30)
CREATININE: 2.96 mg/dL — ABNORMAL HIGH (ref 0.75–1.35)
ESTIMATED GFR: 25 mL/min/BSA — ABNORMAL LOW (ref >=60)
GLUCOSE: 165 mg/dL — ABNORMAL HIGH (ref 65–125)
POTASSIUM: 5.4 mmol/L — ABNORMAL HIGH (ref 3.5–5.1)
SODIUM: 134 mmol/L — ABNORMAL LOW (ref 136–145)

## 2021-03-02 LAB — MAGNESIUM: MAGNESIUM: 1.6 mg/dL — ABNORMAL LOW (ref 1.8–2.6)

## 2021-03-02 LAB — VENOUS BLOOD GAS WITH LACTATE REFLEX
%FIO2 (VENOUS): 21 %
BASE EXCESS: 1 mmol/L (ref <=3.0)
BICARBONATE (VENOUS): 25.3 mmol/L (ref 22.0–26.0)
LACTATE: 0.6 mmol/L (ref 0.0–1.3)
PCO2 (VENOUS): 45 mm/Hg (ref 41–51)
PH (VENOUS): 7.38 (ref 7.31–7.41)
PO2 (VENOUS): 47 mm/Hg (ref 35–50)

## 2021-03-02 LAB — POC BLOOD GLUCOSE (RESULTS)
GLUCOSE, POC: 150 mg/dl — ABNORMAL HIGH (ref 70–105)
GLUCOSE, POC: 154 mg/dl — ABNORMAL HIGH (ref 70–105)
GLUCOSE, POC: 189 mg/dl — ABNORMAL HIGH (ref 70–105)
GLUCOSE, POC: 217 mg/dl — ABNORMAL HIGH (ref 70–105)
GLUCOSE, POC: 233 mg/dl — ABNORMAL HIGH (ref 70–105)

## 2021-03-02 LAB — ECG 12 LEAD
Atrial Rate: 32 {beats}/min
Calculated R Axis: 139 degrees
Calculated T Axis: -87 degrees
PR Interval: 226 ms
QRS Duration: 150 ms
QRS Duration: 138 ms
QT Interval: 604 ms
QTC Calculation: 477 ms
Ventricular rate: 32 {beats}/min

## 2021-03-02 LAB — PT/INR
INR: 1.43 — ABNORMAL HIGH (ref 0.80–1.20)
PROTHROMBIN TIME: 16.6 seconds — ABNORMAL HIGH (ref 9.1–13.9)

## 2021-03-02 LAB — PHOSPHORUS: PHOSPHORUS: 4.2 mg/dL (ref 2.4–4.7)

## 2021-03-02 LAB — TYPE AND CROSS RED CELLS - UNITS: UNITS ORDERED: 2

## 2021-03-02 LAB — POTASSIUM: POTASSIUM: 5.1 mmol/L (ref 3.5–5.1)

## 2021-03-02 LAB — URINE CULTURE,ROUTINE: URINE CULTURE: NO GROWTH

## 2021-03-02 MED ORDER — PREMIXED HEMODIALYSATE
INJECTION | INTRAVENOUS | Status: AC
Start: 2021-03-03 — End: 2021-03-03

## 2021-03-02 MED ORDER — SODIUM CHLORIDE 0.9 % IV BOLUS
40.0000 mL | INJECTION | Freq: Once | Status: AC | PRN
Start: 2021-03-02 — End: 2021-03-02

## 2021-03-02 MED ORDER — SODIUM ZIRCONIUM CYCLOSILICATE 10 GRAM ORAL POWDER PACKET
10.0000 g | Freq: Three times a day (TID) | ORAL | Status: AC
Start: 2021-03-02 — End: 2021-03-04
  Administered 2021-03-02 – 2021-03-03 (×3): 10 g via ORAL
  Administered 2021-03-03 (×2): 0 g via ORAL
  Administered 2021-03-04: 10 g via ORAL
  Filled 2021-03-02 (×6): qty 1

## 2021-03-02 MED ORDER — INSULIN GLARGINE-YFGN (U-100) 100 UNIT/ML SUBCUTANEOUS SOLUTION
6.0000 [IU] | Freq: Every day | SUBCUTANEOUS | Status: DC
Start: 2021-03-02 — End: 2021-03-03
  Administered 2021-03-02 – 2021-03-03 (×2): 6 [IU] via SUBCUTANEOUS
  Filled 2021-03-02 (×3): qty 6

## 2021-03-02 MED ORDER — WARFARIN 2 MG TABLET
4.0000 mg | ORAL_TABLET | Freq: Every evening | ORAL | Status: DC
Start: 2021-03-02 — End: 2021-03-06
  Administered 2021-03-02 – 2021-03-05 (×4): 4 mg via ORAL
  Filled 2021-03-02 (×5): qty 2

## 2021-03-02 MED ORDER — SODIUM ZIRCONIUM CYCLOSILICATE 10 GRAM ORAL POWDER PACKET
10.0000 g | Freq: Every day | ORAL | Status: DC
Start: 2021-03-04 — End: 2021-03-09
  Administered 2021-03-04: 0 g via ORAL
  Administered 2021-03-05 – 2021-03-08 (×4): 10 g via ORAL
  Administered 2021-03-09: 0 g via ORAL
  Filled 2021-03-02 (×6): qty 1

## 2021-03-02 NOTE — Progress Notes (Signed)
[facility]  Medicine Progress Note    [name].  Date of service: 03/02/2021  Date of Admission:  02/28/2021    Hospital Day:  LOS: 2 days      Subjective: No acute events overnight. Patient seen and examined at bedside this morning. Patient reported feeling well, reported that breathing is at baseline, requiring 4L O2 at this time but SpO2 is at high 90's. Patient prefers to keep it at 4L for comfort, unable to wean down due to patient's preference.    Vital Signs:  Temp  Avg: 36.7 C (98 F)  Min: 36.4 C (97.5 F)  Max: 37 C (98.6 F)    Pulse  Avg: 90.9  Min: 83  Max: 97 BP  Min: 105/79  Max: 142/79   Resp  Avg: 21  Min: 13  Max: 51 SpO2  Avg: 97.7 %  Min: 89 %  Max: 100 %          Input/Output    Intake/Output Summary (Last 24 hours) at 03/02/2021 1355  Last data filed at 03/02/2021 1200  Gross per 24 hour   Intake 2197.2 ml   Output 545 ml   Net 1652.2 ml    I/O last shift:  02/21 0700 - 02/21 1859  In: 55 [P.O.:960; I.V.:30]  Out: 280 [Urine:280]   [Held by provider] amLODIPine (NORVASC) tablet, 10 mg, Oral, Daily  aspirin chewable tablet 81 mg, 81 mg, Oral, Daily  atorvastatin (LIPITOR) tablet, 80 mg, Oral, QPM  [Held by provider] carvedilol (COREG) tablet, 25 mg, Oral, 2x/day-Food  cefepime (MAXIPIME) 1 g in NS 100 mL IVPB minibag, 1 g, Intravenous, Q24H  [Held by provider] cyclobenzaprine (FLEXERIL) tablet, 5 mg, Oral, Q8H PRN  D5W 250 mL flush bag, , Intravenous, Q15 Min PRN  doxycycline tablet, 100 mg, Oral, 2x/day  DULoxetine (CYMBALTA) delayed release capsule, 60 mg, Oral, Daily  ezetimibe (ZETIA) tablet, 10 mg, Oral, QPM  ferrous sulfate 324 mg (65 mg elemental IRON) tablet, 324 mg, Oral, Daily before Breakfast  gabapentin (NEURONTIN) capsule, 400 mg, Oral, 3x/day  insulin glargine-yfgn 100 units/mL injection, 6 Units, Subcutaneous, Daily  isosorbide mononitrate (IMDUR) 24 hr extended release tablet, 60 mg, Oral, Daily  lidocaine-menthol (LIDOPATCH) 3.6%-1.25% patch, 1 Patch,  Transdermal, Daily  [Held by provider] lisinopril (PRINIVIL) tablet, 5 mg, Oral, Daily  NS 250 mL flush bag, , Intravenous, Q15 Min PRN  NS flush syringe, 2-6 mL, Intracatheter, Q8HRS  NS flush syringe, 2-6 mL, Intracatheter, Q1 MIN PRN  ondansetron (ZOFRAN) 2 mg/mL injection, 4 mg, Intravenous, Q8H PRN  oxyCODONE (ROXICODONE) immediate release tablet, 5 mg, Oral, Q6H PRN  pantoprazole (PROTONIX) delayed release tablet, 40 mg, Oral, Daily before Breakfast  perflutren lipid microspheres (DEFINITY) 1.3 mL in NS 10 mL (tot vol) injection, 2 mL, Intravenous, Give in Cardiology  polyethylene glycol (MIRALAX) oral packet, 17 g, Oral, Daily  [START ON 03/03/2021] premixed hemodialysate (NATURALYTE) 3.43 L with potassium chloride 2 mEq/L, calcium chloride 2.5 mEq/L, , Hemodialysis, Give in Dialysis  ranolazine (RANEXA) extended release tablet, 500 mg, Oral, 2x/day  sennosides-docusate sodium (SENOKOT-S) 8.6-50mg  per tablet, 1 Tablet, Oral, 2x/day  sevelamer carbonate (RENVELA) tablet, 800 mg, Oral, 3x/day-Meals  sodium zirconium cyclosilicate (LOKELMA) powder, 10 g, Oral, 3x/day   Followed by  Derrill Memo ON 03/04/2021] sodium zirconium cyclosilicate (LOKELMA) powder, 10 g, Oral, Daily  SSIP insulin lispro 100 units/mL injection, 0-12 Units, Subcutaneous, Q4H PRN  torsemide (DEMADEX) tablet, 20 mg, Oral, 2x/day  [START ON 03/03/2021] vancomycin (VANCOCIN)  1,500 mg in NS 500 mL IVPB, 1,500 mg, Intravenous, Every MO, WE, and FR Give in Dialysis  Vancomycin IV - Pharmacist to Dose per Protocol, , Does not apply, Daily PRN  warfarin (COUMADIN) tablet, 4 mg, Oral, NIGHTLY  Warfarin - Pharmacist to Dose per Protocol, , Does not apply, Daily PRN    Physical Exam:  Constitutional: appears chronically ill, appears stated age and no distress  Eyes: Conjunctiva clear.  ENT: ENMT without erythema or injection, mucous membranes moist.  Neck: supple, symmetrical, trachea midline  Respiratory: Clear to auscultation bilaterally.   Cardiovascular:  regular rate and rhythm, S1, S2 normal, no murmur, click, rub or gallop  Gastrointestinal: Soft, non-tender, Bowel sounds normal  Musculoskeletal: R BKA, FROM all extremities, no significant LE edema  Integumentary:  Skin warm and dry and No rashes  Neurologic: Grossly normal, mildly somnolent  Psychiatric: Normal    Labs:  CBC Results Differential Results   Recent Results (from the past 30 hour(s))   CBC WITH DIFF    Collection Time: 03/02/21  2:08 AM   Result Value    WBC 11.5 (H)    HGB 7.2 (L)    HCT 22.9 (L)    PLATELETS 386    Recent Results (from the past 30 hour(s))   CBC WITH DIFF    Collection Time: 03/02/21  2:08 AM   Result Value    WBC 11.5 (H)    NEUTROPHIL % 65    MONOCYTE % 17    BASOPHIL % 0    BASOPHIL # <0.10      BMP Results Other Chemistries Results   Results for orders placed or performed during the hospital encounter of 02/28/21 (from the past 30 hour(s))   POTASSIUM    Collection Time: 03/02/21  4:15 AM   Result Value    POTASSIUM 5.1   BASIC METABOLIC PANEL - AM ONCE    Collection Time: 03/02/21  2:08 AM   Result Value    SODIUM 134 (L)    POTASSIUM 5.4 (H)    CHLORIDE 102    CO2 TOTAL 21 (L)    GLUCOSE 165 (H)    BUN 35 (H)    CREATININE 2.96 (H)    Recent Results (from the past 30 hour(s))   PHOSPHORUS - AM ONCE    Collection Time: 03/02/21  2:08 AM   Result Value    PHOSPHORUS 4.2   MAGNESIUM - AM ONCE    Collection Time: 03/02/21  2:08 AM   Result Value    MAGNESIUM 1.6 (L)      Liver/Pancreas Enzyme Results Liver Function Results   No results found for this or any previous visit (from the past 30 hour(s)). No results found for this or any previous visit (from the past 30 hour(s)).   Cardiac Results Coags Results   Results for orders placed or performed during the hospital encounter of 02/28/21 (from the past 30 hour(s))   TROPONIN-I    Collection Time: 03/01/21  8:42 AM   Result Value    TROPONIN I 77 (H)    Recent Results (from the past 30 hour(s))   PT/INR daily x 7    Collection  Time: 03/02/21  2:08 AM   Result Value    PROTHROMBIN TIME 16.6 (H)    INR 1.43 (H)        Radiology:  Reveiwed    PT/OT: Yes    Consults: Nephrology    Assessment/ Plan:  51 yo male with a significant PMH of HTN, HLD, DM, CAD s/p multiple PCI x10, HFpEF, PAD s/p stenting, ESRD (on dialysis), COPD, BKA, osteomyelitis, and substance use disorder who presented after missing dialysis with hyperkalemia, hypotension, and bradycardia. He received atropine and epinephrine drip while in ICU, and then underwent emergent dialysis with resolution of hyperkalemia and bradycardia.     Acute on chronic hypoxemic respiratory failure, likely due to pulmonary edema in setting of missed dialysis  H/o COPD Bilateral pulmonary infiltrate concerning for pneumonia Concern for HF exacerbation given elevated BNP  Mild bilateral pleural effusion  - CXR: Mild pul congestion  - Currentlyon nasal cannula at his baseline of 4L O2  - continue Albuterol Q4h  - continue broad spectrum antibiotics, can deescalate to Augmentin and doxycycline at discharge  - wean O2 as tolerated, maintain O2 sats >88%  - continue home torsemide    End-stage renal disease on dialysis  Hyperkalemia likely due to missed dialysis  Mild hyponatremia  - Nephro following, appreciate recs  - continue HD MWF  - monitor BMP  - restarted home lokelma    Bradycardia with junctional rhythm status post atropine and epinephrine (resolved), likely due to hyperkalemia  CAD S/Pmultiple PCI (last one 3 months ago) 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  Non-STEMI Most probably type 2 demand ischemia  HTN  HLD  PAD s/p stenting  - Troponindowntrended  - continue home aspirin  - per patient he's not on brilinta anymore, and last cardiac stent was >1 yr ago, will hold brilinta for now, to be addressed by outpatient cardiologist after discharge  - Hold lisinopril, aldactone and Norvasc in setting of soft BP and electrolyte imbalance  - restart home coreg  - continue home Imdur  - TTE  ordered, will f/u results    History of DVT   - on warfarin  - INR 3.9 on admission, warfarin held  - INR subtherapeutic at 1.4 today, will redose warfarin tonight  - will hold off heparin bridge given ongoing hematuria    Acute encephalopathy, likely due to uremia in setting of missed dialysis, cannot r/o delirium due to sepsis and hospitalization  - currently alert and Oriented x3  - CT brain with no acute process  - monitor mental status  - delirium precautions    Normocytic normochromic anemia, likely anemia of chronic disease due to ESRD, possibly compounded by ongoing hematuria and iron deficiency  - Iron deficiency anemia   - trend H&H  - transfuse PRBC prn, goal Hb>7  - continue iron supplementation    Sepsis, likely due to pneumonia given bilateral pulmonary infiltrates on CT chest, UA negative for UTI  - possible component of aspiration due to AMS  - continue vancomycin, cefepime and doxycycline for now  - MRSA nares positive  - blood cultures with no growth till date  - urine strep and legionella negative  - procal elevated to 5.61  - consider de-escalation to Augmentin and doxycycline at discharge    UncontrolledDiabetes mellitus  - continue SSI Protocol   - will dose glargine tomorrow based on insulin requirement from today    DVT/PE Prophylaxis: Heparin    Disposition Planning: Likely back to the SNF, pending PT/OT eval     On 03/02/2021 I spent a total visit time of 55 minutes. Time included review of tests and ordering tests, obtaining/reviewing history, examining the patient, communicating with consultants, documenting clinical information and counseling the patient and/or family regarding the diagnosis and management plan and coordination  of care involved services directly related to patient care.    FOLLOW UP NOTE LEVEL 3 (TOTAL TIME > 50 MINUTES) ([id number])    Avel Peace, MD

## 2021-03-02 NOTE — Care Management Notes (Signed)
MSW spoke with [facility] Liaison [phone] .[name] stated after PT eval completed auth can be started for return to SNF.  PASS still good per Tonya.    MSW updated Service.

## 2021-03-02 NOTE — Anticoag (Signed)
[facility]    Pharmacist Consult for Warfarin Dosing- Progress Note      Max [name]. is an 51 y.o. patient on warfarin for VTE to target a goal INR of 2 - 3. Warfarin was a continuation from prior to admission    Prior to Admission Regimen: Warfarin 4 mg daily  Pertinent details on prior to admission dosing schedule (missed/held doses, etc.): INR supratherapeutic on admission (3.9)    Most Recent Inpatient Labs  Lab Results   Component Value Date    HGB 7.2 (L) 03/02/2021    HCT 22.9 (L) 03/02/2021    ALBUMIN 2.0 (L) 02/28/2021    RBC 2.57 (L) 03/02/2021    PROTHROMTME 16.6 (H) 03/02/2021    APTT 34.6 08/26/2019    TOTBILIRUBIN 0.6 02/28/2021    AST 37 02/28/2021    ALT 10 02/28/2021    PLTCNT 386 03/02/2021       Current Diet Order:  MNT PROTOCOL FOR DIETITIAN  DIET DIABETIC Calorie amount: CC 2000; Additional modifications/limitations: LOW PHOSPHORUS, 2 G POTASSIUM  DIETARY ORAL SUPPLEMENTS Oral Supplements with tray: Ensure Enlive-Chocolate; LUNCH/DINNER; 1 Each       Inpatient Warfarin Dosing:     Date  INR  Warfarin    Dose   Bridging    Regimen              (if applicable)   Concurrent        Antiplatelet(s)  Drug Interactions  Comments   03/01/21 1.6 4 mg none      2/21 1.43 4mg  none                                     Assessment & Plan    . The INR today is SUBtherapeutic  . Give warfarin 4 mg tonight.  . Recheck INR in AM  . Monitor for signs/symptoms of bleeding  . Plan for outpatient management (if known) per primary team or care management:     To be determined           Please contact team pharmacist with any questions about warfarin management.

## 2021-03-03 ENCOUNTER — Other Ambulatory Visit: Payer: Self-pay

## 2021-03-03 ENCOUNTER — Inpatient Hospital Stay (HOSPITAL_COMMUNITY): Payer: Medicare (Managed Care)

## 2021-03-03 DIAGNOSIS — I739 Peripheral vascular disease, unspecified: Secondary | ICD-10-CM

## 2021-03-03 DIAGNOSIS — Z992 Dependence on renal dialysis: Secondary | ICD-10-CM

## 2021-03-03 DIAGNOSIS — N186 End stage renal disease: Secondary | ICD-10-CM

## 2021-03-03 LAB — CBC WITH DIFF
BASOPHIL #: <0.1 10*3/uL (ref <=0.20)
BASOPHIL %: 1 %
EOSINOPHIL #: 0.3 10*3/uL (ref <=0.50)
EOSINOPHIL %: 3 %
HCT: 22.4 % — ABNORMAL LOW (ref 38.9–52.0)
HGB: 7 g/dL — ABNORMAL LOW (ref 13.4–17.5)
IMMATURE GRANULOCYTE #: <0.1 10*3/uL (ref <0.10)
IMMATURE GRANULOCYTE %: 0 % (ref 0–1)
LYMPHOCYTE #: 1.75 10*3/uL (ref 1.00–4.80)
LYMPHOCYTE %: 16 %
MCH: 27.8 pg (ref 26.0–32.0)
MCHC: 31.3 g/dL (ref 31.0–35.5)
MCV: 88.9 fL (ref 78.0–100.0)
MONOCYTE #: 2.07 10*3/uL — ABNORMAL HIGH (ref 0.20–1.10)
MONOCYTE %: 19 %
MPV: 9.3 fL (ref 8.7–12.5)
NEUTROPHIL #: 6.5 10*3/uL (ref 1.50–7.70)
NEUTROPHIL %: 61 %
PLATELETS: 385 10*3/uL (ref 150–400)
RBC: 2.52 10*6/uL — ABNORMAL LOW (ref 4.50–6.10)
RDW-CV: 15.9 % — ABNORMAL HIGH (ref 11.5–15.5)
WBC: 10.7 10*3/uL (ref 3.7–11.0)

## 2021-03-03 LAB — MAGNESIUM: MAGNESIUM: 1.6 mg/dL — ABNORMAL LOW (ref 1.8–2.6)

## 2021-03-03 LAB — BASIC METABOLIC PANEL
ANION GAP: 8 mmol/L (ref 4–13)
BUN/CREA RATIO: 13 (ref 6–22)
BUN: 49 mg/dL — ABNORMAL HIGH (ref 8–25)
CALCIUM: 8.9 mg/dL (ref 8.5–10.0)
CHLORIDE: 100 mmol/L (ref 96–111)
CO2 TOTAL: 22 mmol/L (ref 22–30)
CREATININE: 3.83 mg/dL — ABNORMAL HIGH (ref 0.75–1.35)
ESTIMATED GFR: 19 mL/min/BSA — ABNORMAL LOW (ref >=60)
GLUCOSE: 189 mg/dL — ABNORMAL HIGH (ref 65–125)
POTASSIUM: 5.8 mmol/L — ABNORMAL HIGH (ref 3.5–5.1)
SODIUM: 130 mmol/L — ABNORMAL LOW (ref 136–145)

## 2021-03-03 LAB — CBC
HCT: 23.5 % — ABNORMAL LOW (ref 38.9–52.0)
HGB: 7.1 g/dL — ABNORMAL LOW (ref 13.4–17.5)
MCH: 27.6 pg (ref 26.0–32.0)
MCHC: 30.2 g/dL — ABNORMAL LOW (ref 31.0–35.5)
MCV: 91.4 fL (ref 78.0–100.0)
MPV: 9.6 fL (ref 8.7–12.5)
PLATELETS: 401 10*3/uL — ABNORMAL HIGH (ref 150–400)
RBC: 2.57 10*6/uL — ABNORMAL LOW (ref 4.50–6.10)
RDW-CV: 16.1 % — ABNORMAL HIGH (ref 11.5–15.5)
WBC: 9.9 10*3/uL (ref 3.7–11.0)

## 2021-03-03 LAB — POC BLOOD GLUCOSE (RESULTS)
GLUCOSE, POC: 208 mg/dl — ABNORMAL HIGH (ref 70–105)
GLUCOSE, POC: 208 mg/dl — ABNORMAL HIGH (ref 70–105)
GLUCOSE, POC: 285 mg/dl — ABNORMAL HIGH (ref 70–105)

## 2021-03-03 LAB — BPAM PACKED CELL ORDER

## 2021-03-03 LAB — TYPE AND CROSS RED CELLS - UNITS: ABO/RH(D): O POS

## 2021-03-03 LAB — PT/INR
INR: 1.42 — ABNORMAL HIGH (ref 0.80–1.20)
PROTHROMBIN TIME: 16.4 seconds — ABNORMAL HIGH (ref 9.1–13.9)

## 2021-03-03 LAB — PHOSPHORUS: PHOSPHORUS: 4.8 mg/dL — ABNORMAL HIGH (ref 2.4–4.7)

## 2021-03-03 LAB — ADULT ROUTINE BLOOD CULTURE, SET OF 2 BOTTLES (BACTERIA AND YEAST): BLOOD CULTURE, ROUTINE: NO GROWTH

## 2021-03-03 MED ORDER — INSULIN GLARGINE-YFGN (U-100) 100 UNIT/ML SUBCUTANEOUS SOLUTION
8.0000 [IU] | Freq: Every day | SUBCUTANEOUS | Status: DC
Start: 2021-03-04 — End: 2021-03-09
  Administered 2021-03-04 – 2021-03-09 (×6): 8 [IU] via SUBCUTANEOUS
  Filled 2021-03-03 (×7): qty 8

## 2021-03-03 MED ORDER — PREMIXED HEMODIALYSATE
INJECTION | INTRAVENOUS | Status: AC
Start: 2021-03-05 — End: 2021-03-05

## 2021-03-03 MED ORDER — SODIUM CITRATE 4 % (3 ML) INTRA-CATHETER INJECTION SYRINGE
2.0000 | INJECTION | Status: AC
Start: 2021-03-03 — End: 2021-03-03
  Administered 2021-03-03: 2
  Filled 2021-03-03 (×2): qty 6

## 2021-03-03 NOTE — Nurses Notes (Signed)
Patient arrived to [id number] in no acute distress. Patient oriented to room and call bell. Patient requesting milk and crackers. Transfer dual RN skin assessment completed.

## 2021-03-03 NOTE — Care Management Notes (Signed)
Gave pt handoff to CCC: Burch via secure chat.

## 2021-03-03 NOTE — Nurses Notes (Signed)
Patient returned for dialysis to [facility] room 25 at this time by PMT and transport. Patient VS/assessment per flow sheet. Bed locked lowered/SS active. Reoriented to room/unit.

## 2021-03-03 NOTE — Consults (Signed)
[facility]   Section of Nephrology  ESRD Follow-up Consult Note    Max [name], Max [name]. 51 y.o. male  Date of Admission:  02/28/2021  Date of Birth:  Mar 27, 1970  Date of service: 03/03/2021      REASON FOR CONSULT:Dialysis Management     CONSULTING PHYSICIAN:MICU     ASSESSMENT/RECOMMENDATIONS:    51 y.o. male with past medical history of CAD s/p stents(recent one 3 months ago), HFrEF ,PAD s/p stenting , HTN , DM , ESRD (M/W/F)started 3 months back , Rt BKA , COPD transferred from outside facility to MICU for management of Hyperkalemia with EKG changes after he missed his HD session.    ESRD on HD MWF  Follows at [facility] and undergoes dialysis on M/W/F via Rt TCC    Electrolytes- K 5.8  Acid Base- Stable  Volume status- Euvolemic  CKD [place]- Ca,  phos on target.   Anemia- Stable   Antibiotics- none    Recs:  - s/p Emergent HD on 2/19 early AM 2/2 Hyperkalemia with EKG changes  - HD today as per outpatient MWF schedule. 4hrs, 2-2.5L UF as tolerated, 2K bath for hyperkalemia.  - Maintain MAP > 86mmHg to optimize hemodynamic status  - Avoid nephrotoxins, adjust meds to appropriate GFR  - Daily BMP, mag, phos  - Optimize nutrition   - Strict I/O      SUBJECTIVE:  Seen during HD treatment. Tolerating well w/o complaints.     Inpatient Medications:  [Held by provider] amLODIPine (NORVASC) tablet, 10 mg, Oral, Daily  aspirin chewable tablet 81 mg, 81 mg, Oral, Daily  atorvastatin (LIPITOR) tablet, 80 mg, Oral, QPM  carvedilol (COREG) tablet, 25 mg, Oral, 2x/day-Food  cefepime (MAXIPIME) 1 g in NS 100 mL IVPB minibag, 1 g, Intravenous, Q24H  [Held by provider] cyclobenzaprine (FLEXERIL) tablet, 5 mg, Oral, Q8H PRN  D5W 250 mL flush bag, , Intravenous, Q15 Min PRN  doxycycline tablet, 100 mg, Oral, 2x/day  DULoxetine (CYMBALTA) delayed release capsule, 60 mg, Oral, Daily  ezetimibe (ZETIA) tablet, 10 mg, Oral, QPM  ferrous sulfate 324 mg (65 mg elemental IRON) tablet, 324 mg, Oral, Daily before  Breakfast  gabapentin (NEURONTIN) capsule, 400 mg, Oral, 3x/day  insulin glargine-yfgn 100 units/mL injection, 6 Units, Subcutaneous, Daily  isosorbide mononitrate (IMDUR) 24 hr extended release tablet, 60 mg, Oral, Daily  lidocaine-menthol (LIDOPATCH) 3.6%-1.25% patch, 1 Patch, Transdermal, Daily  [Held by provider] lisinopril (PRINIVIL) tablet, 5 mg, Oral, Daily  NS 250 mL flush bag, , Intravenous, Q15 Min PRN  NS flush syringe, 2-6 mL, Intracatheter, Q8HRS  NS flush syringe, 2-6 mL, Intracatheter, Q1 MIN PRN  ondansetron (ZOFRAN) 2 mg/mL injection, 4 mg, Intravenous, Q8H PRN  oxyCODONE (ROXICODONE) immediate release tablet, 5 mg, Oral, Q6H PRN  pantoprazole (PROTONIX) delayed release tablet, 40 mg, Oral, Daily before Breakfast  polyethylene glycol (MIRALAX) oral packet, 17 g, Oral, Daily  ranolazine (RANEXA) extended release tablet, 500 mg, Oral, 2x/day  sennosides-docusate sodium (SENOKOT-S) 8.6-50mg  per tablet, 1 Tablet, Oral, 2x/day  sevelamer carbonate (RENVELA) tablet, 800 mg, Oral, 3x/day-Meals  sodium zirconium cyclosilicate (LOKELMA) powder, 10 g, Oral, 3x/day   Followed by  Derrill Memo ON 03/04/2021] sodium zirconium cyclosilicate (LOKELMA) powder, 10 g, Oral, Daily  SSIP insulin lispro 100 units/mL injection, 0-12 Units, Subcutaneous, Q4H PRN  torsemide (DEMADEX) tablet, 20 mg, Oral, 2x/day  vancomycin (VANCOCIN) 1,500 mg in NS 500 mL IVPB, 1,500 mg, Intravenous, Every MO, WE, and FR Give in Dialysis  Vancomycin IV -  Pharmacist to Dose per Protocol, , Does not apply, Daily PRN  warfarin (COUMADIN) tablet, 4 mg, Oral, NIGHTLY  Warfarin - Pharmacist to Dose per Protocol, , Does not apply, Daily PRN      Objective:  Filed Vitals:    03/03/21 0100 03/03/21 0200 03/03/21 0300 03/03/21 0720   BP: 118/83 114/81 103/84 128/75   Pulse: 83 80 84 89   Resp: (!) 21 13 16     Temp:   36.4 C (97.6 F)    SpO2: 96% 98% 96%      General :on 3L NC (baseline)  Lungs: crackles+   BWG:[id number]   Access: rt TCC   Rt BKA      I&O:  02/21 0700 - 02/22 0659  In: 1220 [P.O.:960; I.V.:40; Other:220]  Out: 995 [Urine:995]     Labs:  Recent Labs     03/01/21  1257 03/01/21  1642 03/02/21  0208 03/02/21  0415 03/03/21  0030   SODIUM 131* 130* 134*  --  130*   POTASSIUM 4.0 4.2 5.4* 5.1 5.8*   CHLORIDE 99 100 102  --  100   CO2 25 24 21*  --  22   BUN 25 27* 35*  --  49*   CREATININE 2.53* 2.59* 2.96*  --  3.83*   GFR 30* 30* 25*  --  19*   ANIONGAP 7 6 11   --  8     Recent Labs     03/01/21  0453 03/01/21  0745 03/01/21  1257 03/01/21  1642 03/02/21  0208 03/03/21  0030   CALCIUM 8.4*   < > 8.5 8.0* 8.5 8.9   MAGNESIUM 1.9  --   --   --  1.6* 1.6*   PHOSPHORUS 6.0*  --   --   --  4.2 4.8*    < > = values in this interval not displayed.     Recent Labs     03/02/21  0007 03/02/21  0208 03/03/21  0030   WBC 11.0 11.5* 10.7   HGB 6.8* 7.2* 7.0*   HCT 21.7* 22.9* 22.4*   PLTCNT 412* 386 385     No results found for this encounter    Thank you for the consult.  Will continue to follow.   Please call with any questions.    Raina Mina, MD   Nephrology Fellow, PGY-V   Nephrology Consult Team [place]    I saw and examined the patient, personally performed the critical or key portions of the service and discussed patient management with the Nephrology team including the resident,fellow and ancillary staff. I reviewed Nephrology Fellow's note and agree with the history, findings and plan of care.  Exceptions are edited/noted above.  Laboratory, hemodynamic and radiological data were also reviewed and discussed.     Leafy Half MD  Assistant Professor  Section of Nephrology  Department of Medicine  [facility]      Leafy Half, [place]  03/04/2021, 09:25

## 2021-03-03 NOTE — Progress Notes (Signed)
[facility]  Medicine Progress Note    [name].  Date of service: 03/03/2021  Date of Admission:  02/28/2021    Hospital Day:  LOS: 3 days      Subjective: Evaluated patient in HD.  Feels better today.  However, complaining of not enough fluid being removed during HD.  States that he typically gets 6L removed, but has only been getting 3L off.  Reports a heaviness in his leg/stump.  Dyspnea stable and improved.  Tolerating diet.      Vital Signs:  Temp  Avg: 36.4 C (97.6 F)  Min: 36.4 C (97.5 F)  Max: 36.5 C (97.7 F)    Pulse  Avg: 86.1  Min: 77  Max: 97 BP  Min: 103/84  Max: 136/94   Resp  Avg: 21.1  Min: 13  Max: 33 SpO2  Avg: 97.7 %  Min: 93 %  Max: 100 %          Input/Output    Intake/Output Summary (Last 24 hours) at 03/03/2021 0756  Last data filed at 03/03/2021 0200  Gross per 24 hour   Intake 1220 ml   Output 995 ml   Net 225 ml    I/O last shift:  No intake/output data recorded.   [Held by provider] amLODIPine (NORVASC) tablet, 10 mg, Oral, Daily  aspirin chewable tablet 81 mg, 81 mg, Oral, Daily  atorvastatin (LIPITOR) tablet, 80 mg, Oral, QPM  carvedilol (COREG) tablet, 25 mg, Oral, 2x/day-Food  cefepime (MAXIPIME) 1 g in NS 100 mL IVPB minibag, 1 g, Intravenous, Q24H  [Held by provider] cyclobenzaprine (FLEXERIL) tablet, 5 mg, Oral, Q8H PRN  D5W 250 mL flush bag, , Intravenous, Q15 Min PRN  doxycycline tablet, 100 mg, Oral, 2x/day  DULoxetine (CYMBALTA) delayed release capsule, 60 mg, Oral, Daily  ezetimibe (ZETIA) tablet, 10 mg, Oral, QPM  ferrous sulfate 324 mg (65 mg elemental IRON) tablet, 324 mg, Oral, Daily before Breakfast  gabapentin (NEURONTIN) capsule, 400 mg, Oral, 3x/day  insulin glargine-yfgn 100 units/mL injection, 6 Units, Subcutaneous, Daily  isosorbide mononitrate (IMDUR) 24 hr extended release tablet, 60 mg, Oral, Daily  lidocaine-menthol (LIDOPATCH) 3.6%-1.25% patch, 1 Patch, Transdermal, Daily  [Held by provider] lisinopril (PRINIVIL) tablet, 5 mg, Oral,  Daily  NS 250 mL flush bag, , Intravenous, Q15 Min PRN  NS flush syringe, 2-6 mL, Intracatheter, Q8HRS  NS flush syringe, 2-6 mL, Intracatheter, Q1 MIN PRN  ondansetron (ZOFRAN) 2 mg/mL injection, 4 mg, Intravenous, Q8H PRN  oxyCODONE (ROXICODONE) immediate release tablet, 5 mg, Oral, Q6H PRN  pantoprazole (PROTONIX) delayed release tablet, 40 mg, Oral, Daily before Breakfast  polyethylene glycol (MIRALAX) oral packet, 17 g, Oral, Daily  premixed hemodialysate (NATURALYTE) 3.43 L with potassium chloride 2 mEq/L, calcium chloride 2.5 mEq/L, , Hemodialysis, Give in Dialysis  ranolazine (RANEXA) extended release tablet, 500 mg, Oral, 2x/day  sennosides-docusate sodium (SENOKOT-S) 8.6-50mg  per tablet, 1 Tablet, Oral, 2x/day  sevelamer carbonate (RENVELA) tablet, 800 mg, Oral, 3x/day-Meals  sodium citrate 4% injection syringe, 2 Syringe, Intracatheter, Give in Dialysis  sodium zirconium cyclosilicate (LOKELMA) powder, 10 g, Oral, 3x/day   Followed by  Derrill Memo ON 03/04/2021] sodium zirconium cyclosilicate (LOKELMA) powder, 10 g, Oral, Daily  SSIP insulin lispro 100 units/mL injection, 0-12 Units, Subcutaneous, Q4H PRN  torsemide (DEMADEX) tablet, 20 mg, Oral, 2x/day  vancomycin (VANCOCIN) 1,500 mg in NS 500 mL IVPB, 1,500 mg, Intravenous, Every MO, WE, and FR Give in Dialysis  Vancomycin IV - Pharmacist to Dose per Protocol, ,  Does not apply, Daily PRN  warfarin (COUMADIN) tablet, 4 mg, Oral, NIGHTLY  Warfarin - Pharmacist to Dose per Protocol, , Does not apply, Daily PRN    Physical Exam:  General Appearance: No acute distress, chronically ill-appearing, and vital signs reviewed  Eyes: Conjunctiva clear, pupils equal and round, sclera non-icteric, EOMI  Neck: Supple, trachea midline  Lungs: Clear to auscultation bilaterally, no wheezes or crackles, breathing comfortably on NC  Heart: S1S2, RRR  Abdomen: Soft, non-tender, non-distended, normoactive bowel sounds  Extremities/MSK: Trace edema, right BKA, moves all 4  extremities  Integumentary:  Skin warm and dry, no rashes   Neurologic: Grossly normal, alert, no focal neurologic deficits      Labs:  I reviewed labs  CBC Results Differential Results   Recent Results (from the past 30 hour(s))   CBC WITH DIFF    Collection Time: 03/03/21 12:30 AM   Result Value    WBC 10.7    HGB 7.0 (L)    HCT 22.4 (L)    PLATELETS 385    Recent Results (from the past 30 hour(s))   CBC WITH DIFF    Collection Time: 03/03/21 12:30 AM   Result Value    WBC 10.7    NEUTROPHIL % 61    MONOCYTE % 19    BASOPHIL % 1    BASOPHIL # <0.10      BMP Results Other Chemistries Results   Results for orders placed or performed during the hospital encounter of 02/28/21 (from the past 30 hour(s))   BASIC METABOLIC PANEL - AM ONCE    Collection Time: 03/03/21 12:30 AM   Result Value    SODIUM 130 (L)    POTASSIUM 5.8 (H)    CHLORIDE 100    CO2 TOTAL 22    GLUCOSE 189 (H)    BUN 49 (H)    CREATININE 3.83 (H)   POTASSIUM    Collection Time: 03/02/21  4:15 AM   Result Value    POTASSIUM 5.1    Recent Results (from the past 30 hour(s))   PHOSPHORUS - AM ONCE    Collection Time: 03/03/21 12:30 AM   Result Value    PHOSPHORUS 4.8 (H)   MAGNESIUM - AM ONCE    Collection Time: 03/03/21 12:30 AM   Result Value    MAGNESIUM 1.6 (L)      Liver/Pancreas Enzyme Results Liver Function Results   No results found for this or any previous visit (from the past 30 hour(s)). No results found for this or any previous visit (from the past 30 hour(s)).   Cardiac Results Coags Results   No results found for this or any previous visit (from the past 30 hour(s)). Recent Results (from the past 30 hour(s))   PT/INR    Collection Time: 03/03/21 12:30 AM   Result Value    PROTHROMBIN TIME 16.4 (H)    INR 1.42 (H)        Radiology:  I reviewed imaging    PT/OT: Yes    Consults: Nephrology    Assessment/ Plan:   51 yo male with a significant PMH of HTN, HLD, DM, CAD s/p multiple PCI x10, HFpEF, PAD s/p stenting, ESRD (on dialysis), COPD, BKA,  osteomyelitis, and substance use disorder who presented after missing dialysis with hyperkalemia, hypotension, and bradycardia. He received atropine and epinephrine drip while in ICU, and then underwent emergent dialysis with resolution of hyperkalemia and bradycardia.     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  Non-STEMI Most probably type 2 demand ischemia  HTN  HLD  PAD s/p stenting  - Troponinpeaked at 105 and has downtrended  - Continue ASA 81 mg daily  - Per patient he's not on brilinta anymore, and last cardiac stent was >1 yr ago, will hold brilinta for now, to be addressed by outpatient cardiologist after discharge  - Hold lisinopril, aldactone and Norvasc in setting of soft BP and electrolyte imbalance  - Restarted home Coreg 25 mg BID and Imdur 60 mg daily  - TTE as above    History of DVT  INR subtherapeutic  - INR 3.9 on admission, warfarin held at that time but was restarted on 2/20  - INR subtherapeutic at 1.42 today    Acute encephalopathy, likely due to uremia in setting of missed dialysis, cannot r/o delirium due to sepsis and hospitalization  - CT brain with no acute process  - Delirium precautions    Normocytic normochromic anemia  - Likely anemia of chronic disease due to ESRD, possibly compounded by ongoing hematuria and iron deficiency  - Transfuse if Hb <7  - Continue iron supplementation    Sepsis, likely due to pneumonia  given bilateral pulmonary infiltrates on CT chest, UA negative for UTI  - Possible component of aspiration due to AMS  - Completed ABX    UncontrolledDiabetes mellitus  - Lantus 8 units daily and SSI       DVT/PE Prophylaxis: Warfarin    Disposition Planning: Skilled Nursing Unit     Allie Bossier, DO  Assistant Professor of [facility]  [phone]

## 2021-03-03 NOTE — Care Plan (Signed)
03/03/21 0950   Therapist Pager   OT Assigned/ Pager # Ailene Ravel [phone]   Rehab Session   Document Type rehab contact note   Total OT Minutes: 0   Daily Activity AM-PAC/6-clicks Score   Patient Mobility Barrier Patient off floor for procedure/test       Off the floor at Dialysis. Will re-attempt as able.    Sherald Hess, OT  Pager [phone]

## 2021-03-03 NOTE — Nurses Notes (Signed)
HD treatment completed. 2L UF. Vitals stable.

## 2021-03-03 NOTE — Anticoag (Signed)
[place] / [place]    Pharmacist Consult for Warfarin Dosing- Progress Note      Max [name]. is an 51 y.o. patient on warfarin for VTE to target a goal INR of 2 - 3. Warfarin was a continuation from prior to admission    Prior to Admission Regimen: Warfarin 4 mg daily  Pertinent details on prior to admission dosing schedule (missed/held doses, etc.): INR supratherapeutic on admission (3.9)    Most Recent Inpatient Labs  Lab Results   Component Value Date    HGB 7.0 (L) 03/03/2021    HCT 22.4 (L) 03/03/2021    ALBUMIN 2.0 (L) 02/28/2021    RBC 2.52 (L) 03/03/2021    PROTHROMTME 16.4 (H) 03/03/2021    APTT 34.6 08/26/2019    TOTBILIRUBIN 0.6 02/28/2021    AST 37 02/28/2021    ALT 10 02/28/2021    PLTCNT 385 03/03/2021       Current Diet Order:  MNT PROTOCOL FOR DIETITIAN  DIET DIABETIC Calorie amount: CC 2000; Additional modifications/limitations: LOW PHOSPHORUS, 2 G POTASSIUM  DIETARY ORAL SUPPLEMENTS Oral Supplements with tray: Ensure Enlive-Chocolate; LUNCH/DINNER; 1 Each       Inpatient Warfarin Dosing:     Date  INR  Warfarin    Dose   Bridging    Regimen              (if applicable)   Concurrent        Antiplatelet(s)  Drug Interactions  Comments   03/01/21 1.6 4 mg none      2/21 1.43 4mg  none      2/22 1.42 4mg  none                            Assessment & Plan    . The INR today is SUBtherapeutic  . Give warfarin 4 mg tonight.  . Recheck INR in AM  . Monitor for signs/symptoms of bleeding  . Plan for outpatient management (if known) per primary team or care management:     To be determined           Please contact team pharmacist with any questions about warfarin management.

## 2021-03-03 NOTE — Care Plan (Signed)
Problem: Adult Inpatient Plan of Care  Goal: Plan of Care Review  Outcome: Outcome Achieved  Goal: Patient-Specific Goal (Individualized)  Outcome: Outcome Achieved  Flowsheets  Taken 03/03/2021 2043  Individualized Care Needs: Pain mgmt  Taken 03/03/2021 2015  Individualized Care Needs: Pain mgmt  Goal: Absence of Hospital-Acquired Illness or Injury  Outcome: Outcome Achieved  Intervention: Identify and Manage Fall Risk  Recent Flowsheet Documentation  Taken 03/03/2021 2015 by Lavell Anchors, RN  Safety Promotion/Fall Prevention: activity supervised  Intervention: Prevent Skin Injury  Recent Flowsheet Documentation  Taken 03/03/2021 2015 by Lavell Anchors, RN  Body Position: turned q 2 hours  Skin Protection:   adhesive use limited   incontinence pads utilized  Intervention: Prevent and Manage VTE (Venous Thromboembolism) Risk  Recent Flowsheet Documentation  Taken 03/03/2021 2015 by Lavell Anchors, RN  VTE Prevention/Management: ambulation promoted  Intervention: Prevent Infection  Recent Flowsheet Documentation  Taken 03/03/2021 2015 by Lavell Anchors, RN  Infection Prevention: barrier precautions utilized  Goal: Optimal Comfort and Wellbeing  Outcome: Outcome Achieved  Intervention: Provide Person-Centered Care  Recent Flowsheet Documentation  Taken 03/03/2021 2015 by Lavell Anchors, RN  Trust Relationship/Rapport: care explained  Goal: Rounds/Family Conference  Outcome: Outcome Achieved     Problem: Skin Injury Risk Increased  Goal: Skin Health and Integrity  Outcome: Outcome Achieved  Intervention: Optimize Skin Protection  Recent Flowsheet Documentation  Taken 03/03/2021 2015 by Lavell Anchors, RN  Pressure Reduction Techniques:   (L,M,H,VH)Frequent Turning   frequent weight shift encouraged  Pressure Reduction Devices: (M,H,VH) Use Repositioning Devices or Pillows  Skin Protection:   adhesive use limited   incontinence pads utilized  Activity Management: activity adjusted per tolerance  Head of Bed (HOB) Positioning:  HOB at 30-45 degrees

## 2021-03-04 DIAGNOSIS — J9601 Acute respiratory failure with hypoxia: Secondary | ICD-10-CM

## 2021-03-04 LAB — CBC WITH DIFF
BASOPHIL #: <0.1 10*3/uL (ref <=0.20)
BASOPHIL %: 1 %
EOSINOPHIL #: 0.24 10*3/uL (ref <=0.50)
EOSINOPHIL %: 2 %
HCT: 23.5 % — ABNORMAL LOW (ref 38.9–52.0)
HGB: 7.1 g/dL — ABNORMAL LOW (ref 13.4–17.5)
IMMATURE GRANULOCYTE #: <0.1 10*3/uL (ref <0.10)
IMMATURE GRANULOCYTE %: 1 % (ref 0–1)
LYMPHOCYTE #: 1.82 10*3/uL (ref 1.00–4.80)
LYMPHOCYTE %: 18 %
MCH: 27.7 pg (ref 26.0–32.0)
MCHC: 30.2 g/dL — ABNORMAL LOW (ref 31.0–35.5)
MCV: 91.8 fL (ref 78.0–100.0)
MONOCYTE #: 2.11 10*3/uL — ABNORMAL HIGH (ref 0.20–1.10)
MONOCYTE %: 21 %
MPV: 9.4 fL (ref 8.7–12.5)
NEUTROPHIL #: 6.03 10*3/uL (ref 1.50–7.70)
NEUTROPHIL %: 57 %
PLATELETS: 398 10*3/uL (ref 150–400)
RBC: 2.56 10*6/uL — ABNORMAL LOW (ref 4.50–6.10)
RDW-CV: 16.3 % — ABNORMAL HIGH (ref 11.5–15.5)
WBC: 10.3 10*3/uL (ref 3.7–11.0)

## 2021-03-04 LAB — PT/INR
INR: 1.69 — ABNORMAL HIGH (ref 0.80–1.20)
PROTHROMBIN TIME: 19.6 seconds — ABNORMAL HIGH (ref 9.1–13.9)

## 2021-03-04 LAB — BASIC METABOLIC PANEL
ANION GAP: 7 mmol/L (ref 4–13)
BUN/CREA RATIO: 12 (ref 6–22)
BUN: 34 mg/dL — ABNORMAL HIGH (ref 8–25)
CALCIUM: 8.5 mg/dL (ref 8.5–10.0)
CHLORIDE: 103 mmol/L (ref 96–111)
CO2 TOTAL: 23 mmol/L (ref 22–30)
CREATININE: 2.93 mg/dL — ABNORMAL HIGH (ref 0.75–1.35)
ESTIMATED GFR: 26 mL/min/BSA — ABNORMAL LOW (ref >=60)
GLUCOSE: 227 mg/dL — ABNORMAL HIGH (ref 65–125)
POTASSIUM: 5.5 mmol/L — ABNORMAL HIGH (ref 3.5–5.1)
SODIUM: 133 mmol/L — ABNORMAL LOW (ref 136–145)

## 2021-03-04 LAB — POC BLOOD GLUCOSE (RESULTS)
GLUCOSE, POC: 217 mg/dl — ABNORMAL HIGH (ref 70–105)
GLUCOSE, POC: 127 mg/dl — ABNORMAL HIGH (ref 70–105)
GLUCOSE, POC: 171 mg/dl — ABNORMAL HIGH (ref 70–105)
GLUCOSE, POC: 107 mg/dl — ABNORMAL HIGH (ref 70–105)

## 2021-03-04 NOTE — Care Plan (Signed)
Problem: Adult Inpatient Plan of Care  Goal: Plan of Care Review  Outcome: Ongoing (see interventions/notes)  Goal: Patient-Specific Goal (Individualized)  Outcome: Ongoing (see interventions/notes)  Flowsheets  Taken 03/04/2021 2224  Individualized Care Needs: HD tomorrow 2/24  Taken 03/04/2021 2205  Individualized Care Needs: HD to take place tomorrow  Goal: Absence of Hospital-Acquired Illness or Injury  Outcome: Ongoing (see interventions/notes)  Intervention: Identify and Manage Fall Risk  Recent Flowsheet Documentation  Taken 03/04/2021 2205 by Lavell Anchors, RN  Safety Promotion/Fall Prevention: safety round/check completed  Intervention: Prevent Skin Injury  Recent Flowsheet Documentation  Taken 03/04/2021 2205 by Lavell Anchors, RN  Body Position: positioned/repositioned independently  Skin Protection:   adhesive use limited   incontinence pads utilized  Intervention: Prevent and Manage VTE (Venous Thromboembolism) Risk  Recent Flowsheet Documentation  Taken 03/04/2021 2205 by Lavell Anchors, RN  VTE Prevention/Management: ambulation promoted  Intervention: Prevent Infection  Recent Flowsheet Documentation  Taken 03/04/2021 2205 by Lavell Anchors, RN  Infection Prevention: barrier precautions utilized  Goal: Optimal Comfort and Wellbeing  Outcome: Ongoing (see interventions/notes)  Intervention: Provide Person-Centered Care  Recent Flowsheet Documentation  Taken 03/04/2021 2205 by Lavell Anchors, RN  Trust Relationship/Rapport: care explained  Goal: Rounds/Family Conference  Outcome: Ongoing (see interventions/notes)     Problem: Skin Injury Risk Increased  Goal: Skin Health and Integrity  Outcome: Ongoing (see interventions/notes)  Intervention: Optimize Skin Protection  Recent Flowsheet Documentation  Taken 03/04/2021 2205 by Lavell Anchors, RN  Pressure Reduction Techniques:   heels elevated off bed   frequent weight shift encouraged  Pressure Reduction Devices: heels elevated off bed  Skin Protection:   adhesive  use limited   incontinence pads utilized  Activity Management: activity adjusted per tolerance  Head of Bed (HOB) Positioning: HOB at 30-45 degrees     Problem: Hemodialysis  Goal: Safe, Effective Therapy Delivery  Outcome: Ongoing (see interventions/notes)  Goal: Effective Tissue Perfusion  Outcome: Ongoing (see interventions/notes)  Goal: Absence of Infection Signs and Symptoms  Outcome: Ongoing (see interventions/notes)  Intervention: Prevent or Manage Infection  Recent Flowsheet Documentation  Taken 03/04/2021 2205 by Lavell Anchors, RN  Infection Prevention: barrier precautions utilized

## 2021-03-04 NOTE — Progress Notes (Signed)
[facility]  Medicine Progress Note    Max [name].  Date of service: 03/04/2021  Date of Admission:  02/28/2021    Hospital Day:  LOS: 4 days      Subjective: No issues overnight.  Denies any worsening shortness of breath or chest pain.  Tolerating diet.  No N/V.    Vital Signs:  Temp  Avg: 36.8 C (98.3 F)  Min: 36.4 C (97.6 F)  Max: 37.1 C (98.8 F)    Pulse  Avg: 83.2  Min: 74  Max: 89 BP  Min: 107/90  Max: 133/68   Resp  Avg: 17.8  Min: 14  Max: 25 SpO2  Avg: 94.3 %  Min: 89 %  Max: 98 %          Input/Output    Intake/Output Summary (Last 24 hours) at 03/04/2021 1548  Last data filed at 03/04/2021 1300  Gross per 24 hour   Intake 1874 ml   Output 550 ml   Net 1324 ml    I/O last shift:  02/23 0700 - 02/23 1859  In: 7026 [P.O.:1314; Other:60]  Out: 250 [Urine:250]   [Held by provider] amLODIPine (NORVASC) tablet, 10 mg, Oral, Daily  aspirin chewable tablet 81 mg, 81 mg, Oral, Daily  atorvastatin (LIPITOR) tablet, 80 mg, Oral, QPM  carvedilol (COREG) tablet, 25 mg, Oral, 2x/day-Food  [Held by provider] cyclobenzaprine (FLEXERIL) tablet, 5 mg, Oral, Q8H PRN  D5W 250 mL flush bag, , Intravenous, Q15 Min PRN  doxycycline tablet, 100 mg, Oral, 2x/day  DULoxetine (CYMBALTA) delayed release capsule, 60 mg, Oral, Daily  ezetimibe (ZETIA) tablet, 10 mg, Oral, QPM  ferrous sulfate 324 mg (65 mg elemental IRON) tablet, 324 mg, Oral, Daily before Breakfast  gabapentin (NEURONTIN) capsule, 400 mg, Oral, 3x/day  insulin glargine-yfgn 100 units/mL injection, 8 Units, Subcutaneous, Daily  isosorbide mononitrate (IMDUR) 24 hr extended release tablet, 60 mg, Oral, Daily  lidocaine-menthol (LIDOPATCH) 3.6%-1.25% patch, 1 Patch, Transdermal, Daily  [Held by provider] lisinopril (PRINIVIL) tablet, 5 mg, Oral, Daily  NS 250 mL flush bag, , Intravenous, Q15 Min PRN  NS flush syringe, 2-6 mL, Intracatheter, Q8HRS  NS flush syringe, 2-6 mL, Intracatheter, Q1 MIN PRN  ondansetron (ZOFRAN) 2 mg/mL injection, 4 mg,  Intravenous, Q8H PRN  oxyCODONE (ROXICODONE) immediate release tablet, 5 mg, Oral, Q6H PRN  pantoprazole (PROTONIX) delayed release tablet, 40 mg, Oral, Daily before Breakfast  polyethylene glycol (MIRALAX) oral packet, 17 g, Oral, Daily  [START ON 03/05/2021] premixed hemodialysate (NATURALYTE) 3.43 L with potassium chloride 2 mEq/L, calcium chloride 2.5 mEq/L, , Hemodialysis, Give in Dialysis  ranolazine (RANEXA) extended release tablet, 500 mg, Oral, 2x/day  sennosides-docusate sodium (SENOKOT-S) 8.6-50mg  per tablet, 1 Tablet, Oral, 2x/day  sevelamer carbonate (RENVELA) tablet, 800 mg, Oral, 3x/day-Meals  sodium zirconium cyclosilicate (LOKELMA) powder, 10 g, Oral, Daily  SSIP insulin lispro 100 units/mL injection, 0-12 Units, Subcutaneous, Q4H PRN  torsemide (DEMADEX) tablet, 20 mg, Oral, 2x/day  warfarin (COUMADIN) tablet, 4 mg, Oral, NIGHTLY  Warfarin - Pharmacist to Dose per Protocol, , Does not apply, Daily PRN    Physical Exam:  General Appearance: No acute distress, chronically ill-appearing, and vital signs reviewed  Eyes: Conjunctiva clear, pupils equal and round, sclera non-icteric, EOMI  Neck: Supple, trachea midline  Lungs: Clear to auscultation bilaterally, no wheezes, breathing comfortably on NC  Heart: S1S2, RRR  Abdomen: Soft, non-tender, non-distended, normoactive bowel sounds  Extremities/MSK: 2+ edema, right BKA, moves all 4 extremities  Integumentary:  Skin  warm and dry, no rashes, pale  Neurologic: Grossly normal, alert, no focal neurologic deficits      Labs:  I reviewed labs  CBC Results Differential Results   Recent Results (from the past 30 hour(s))   CBC WITH DIFF    Collection Time: 03/04/21  2:48 AM   Result Value    WBC 10.3    HGB 7.1 (L)    HCT 23.5 (L)    PLATELETS 398    Recent Results (from the past 30 hour(s))   CBC WITH DIFF    Collection Time: 03/04/21  2:48 AM   Result Value    WBC 10.3    NEUTROPHIL % 57    MONOCYTE % 21    BASOPHIL % 1    BASOPHIL # <0.10      BMP Results  Other Chemistries Results   Results for orders placed or performed during the hospital encounter of 02/28/21 (from the past 30 hour(s))   BASIC METABOLIC PANEL, NON-FASTING    Collection Time: 03/04/21  2:48 AM   Result Value    SODIUM 133 (L)    POTASSIUM 5.5 (H)    CHLORIDE 103    CO2 TOTAL 23    GLUCOSE 227 (H)    BUN 34 (H)    CREATININE 2.93 (H)    No results found for this or any previous visit (from the past 30 hour(s)).   Liver/Pancreas Enzyme Results Liver Function Results   No results found for this or any previous visit (from the past 30 hour(s)). No results found for this or any previous visit (from the past 30 hour(s)).   Cardiac Results Coags Results   No results found for this or any previous visit (from the past 30 hour(s)). Recent Results (from the past 30 hour(s))   PT/INR    Collection Time: 03/04/21  2:48 AM   Result Value    PROTHROMBIN TIME 19.6 (H)    INR 1.69 (H)        Radiology:  I reviewed imaging    PT/OT: Yes    Consults: Nephrology    Assessment/ Plan:   51 yo male with a significant PMH of HTN, HLD, DM, CAD s/p multiple PCI x10, HFpEF, PAD s/p stenting, ESRD (on dialysis), COPD, BKA, osteomyelitis, and substance use disorder who presented after missing dialysis with hyperkalemia, hypotension, and bradycardia. He received atropine and epinephrine drip while in ICU, and then underwent emergent dialysis with resolution of hyperkalemia and bradycardia.     Acute on chronic hypoxemic respiratory failure secondary to volume overload from missed HD  HFpEF, possible decompensated HF  H/o COPD Suspect PNA  - BNP 1114 on admission  - Currently on baseline 4L NC  - Continue to diurese.  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  Non-STEMI Most probably type 2 demand ischemia  HTN  HLD  PAD s/p stenting  - Troponinpeaked at 105 and has downtrended  - Continue ASA 81 mg daily  - Per patient he's not on brilinta anymore, and last cardiac stent was >1 yr ago, will hold brilinta for now, to  be addressed by outpatient cardiologist after discharge  - Hold lisinopril, aldactone and Norvasc in setting of soft BP and electrolyte imbalance  - Restarted home Coreg 25 mg BID and Imdur 60 mg daily  - TTE as above    History of DVT  INR subtherapeutic  - INR 3.9 on admission, warfarin held at that time but was restarted on 2/20  - INR subtherapeutic at 1.69 today, will get Coumadin 4 mg tonight    Acute encephalopathy, likely due to uremia in setting of missed dialysis, cannot r/o delirium due to sepsis and hospitalization  - CT brain with no acute process  - Delirium precautions    Normocytic normochromic anemia  - Likely anemia of chronic disease due to ESRD, possibly compounded by ongoing hematuria and iron deficiency  - Transfuse if Hb <7  - Continue iron supplementation    Sepsis, likely due to pneumonia given bilateral pulmonary infiltrates on CT chest, UA negative for UTI  - Possible component of aspiration due to AMS  - Completed ABX    UncontrolledDiabetes mellitus  - Lantus 8 units daily and SSI       DVT/PE Prophylaxis: Warfarin    Disposition Planning: Skilled Nursing Unit       Allie Bossier, DO

## 2021-03-04 NOTE — Pharmacy (Signed)
[place] / Department of Pharmaceutical Services  Therapeutic Drug Monitoring: Vancomycin  03/04/2021      Patient name: Max [name], Max [name].  Date of Birth:  12-14-1970    Actual Weight:  Weight: 101 kg (222 lb 7.1 oz) (03/04/21 0516)     BMI:         Date RPh Current regimen (including mg/kg) Indication &  Organism AUC or trough based dosing Target Levels^ SCr (mg/dL) CrCl* (mL/min) Infectious Laboratory Markers (as applicable)   Measured level(s)   (mcg/mL) Calculated AUC (if AUC based monitoring) Plan & predicted AUC/trough if initial dosing (including when levels are due) Comments   2/19 jmw Vanc 1250 mg x1 ED OTF Empiric Tr 10-15 5.12 19.3 WBC:  Procal:  CRP:   Pt received 1250 mg x 1 at outside facitily. Dialysis pt who missed dialysis 2/18. Will start intermittent dosing and draw random level 24 hours post infusion.  May need to redose if dialysis is planned.    2/19 ELZ As above Empiric  trough Pre HD 20-25 iHD iHD N/a   HD session 2/19 after Vanc dose. Will give 750mg  x1 now. Next HD session planned for 2/20 - consider pre-HD Vanc level if to continue.     2/20 LM Intermittent Empiric trough Pre HD 20-25  HD    Will schedule Vanc 1500mg  with HD MWF. Random torugh prior to session Friday.    2/23 LM Therapy completed                                                                Katherina Mires levels depends on dosing and monitoring method, AUC vs. trough based. For AUC based dosing units are mg*h/L. For trough based dosing units are mcg/mL.     *Creatinine clearance is estimated by using the Cockcroft-Gault equation for adult patients and the Carol Ada for pediatric patients.    The decision to discontinue vancomycin therapy will be determined by the primary service.  Please contact the pharmacist with any questions regarding this patient's medication regimen.

## 2021-03-04 NOTE — Care Plan (Signed)
VSS and assessments per flow sheets. Patient lethargic, only staying awake for meals. He has refused all attempts to be OOB to chair. Mepilex intact to sacral area. Patient educated on changing positions to relieve pressure.  O2 sats >94% on 3L NC. No cough/SOB noted. Patient medicated X1 with prn oxy with adequate relief. Monitor NSR. Fall/skin and isolation precautions maintained. Call light with in reach and  SS in place and audible alarm. Plan is for patient to have dialysis in AM and to be discharged to nursing home when medically stable.  Problem: Adult Inpatient Plan of Care  Goal: Plan of Care Review  03/04/2021 1747 by Pascal Lux, RN  Outcome: Ongoing (see interventions/notes)  03/04/2021 1747 by Pascal Lux, RN  Reactivated  Goal: Patient-Specific Goal (Individualized)  03/04/2021 1747 by Pascal Lux, RN  Outcome: Ongoing (see interventions/notes)  Flowsheets (Taken 03/04/2021 1747)  Individualized Care Needs: encourage patient to be OOB to chair for meals  Anxieties, Fears or Concerns: none stated  03/04/2021 1747 by Pascal Lux, RN  Reactivated  Flowsheets (Taken 03/04/2021 1747)  Individualized Care Needs: encourage patient to be OOB to chair for meals  Anxieties, Fears or Concerns: none stated  Goal: Absence of Hospital-Acquired Illness or Injury  03/04/2021 1747 by Pascal Lux, RN  Outcome: Ongoing (see interventions/notes)  03/04/2021 1747 by Pascal Lux, RN  Reactivated  Intervention: Identify and Manage Fall Risk  Recent Flowsheet Documentation  Taken 03/04/2021 1700 by Pascal Lux, RN  Safety Promotion/Fall Prevention: safety round/check completed  Taken 03/04/2021 1600 by Pascal Lux, RN  Safety Promotion/Fall Prevention: safety round/check completed  Taken 03/04/2021 1515 by Pascal Lux, RN  Safety Promotion/Fall Prevention:  . activity supervised  . fall prevention program maintained  . muscle strengthening facilitated  . motion sensor pad activated  . nonskid shoes/slippers when out of  bed  . safety round/check completed  Taken 03/04/2021 1500 by Pascal Lux, RN  Safety Promotion/Fall Prevention: safety round/check completed  Taken 03/04/2021 1400 by Pascal Lux, RN  Safety Promotion/Fall Prevention: safety round/check completed  Taken 03/04/2021 1300 by Pascal Lux, RN  Safety Promotion/Fall Prevention: safety round/check completed  Taken 03/04/2021 1200 by Pascal Lux, RN  Safety Promotion/Fall Prevention:  . activity supervised  . fall prevention program maintained  . muscle strengthening facilitated  . motion sensor pad activated  . nonskid shoes/slippers when out of bed  . safety round/check completed  Taken 03/04/2021 1100 by Pascal Lux, RN  Safety Promotion/Fall Prevention: safety round/check completed  Taken 03/04/2021 1000 by Pascal Lux, RN  Safety Promotion/Fall Prevention: safety round/check completed  Taken 03/04/2021 0900 by Pascal Lux, RN  Safety Promotion/Fall Prevention: safety round/check completed  Taken 03/04/2021 4270 by Pascal Lux, RN  Safety Promotion/Fall Prevention:  . activity supervised  . fall prevention program maintained  . muscle strengthening facilitated  . motion sensor pad activated  . nonskid shoes/slippers when out of bed  . safety round/check completed  Intervention: Prevent Skin Injury  Recent Flowsheet Documentation  Taken 03/04/2021 1515 by Pascal Lux, RN  Body Position:  . supine, head elevated  . positioned/repositioned independently  Skin Protection:  . adhesive use limited  . antimicrobial wipes  . incontinence pads utilized  . protective footwear used  . preventative decubiti skin protection foam dressing applied/intact  . transparent dressing maintained  Taken 03/04/2021 1200 by Pascal Lux, RN  Body Position:  . supine, head elevated  . positioned/repositioned independently  Skin Protection:  . adhesive use limited  . antimicrobial wipes  .  incontinence pads utilized  . protective footwear used  . preventative decubiti skin protection  foam dressing applied/intact  . transparent dressing maintained  Taken 03/04/2021 6378 by Pascal Lux, RN  Body Position:  . supine, head elevated  . positioned/repositioned independently  Skin Protection:  . adhesive use limited  . antimicrobial wipes  . incontinence pads utilized  . protective footwear used  . preventative decubiti skin protection foam dressing applied/intact  . transparent dressing maintained  Intervention: Prevent and Manage VTE (Venous Thromboembolism) Risk  Recent Flowsheet Documentation  Taken 03/04/2021 1515 by Pascal Lux, RN  VTE Prevention/Management:  . ambulation promoted  . anticoagulant therapy maintained  . dorsiflexion/plantar flexion performed  Taken 03/04/2021 5885 by Pascal Lux, RN  VTE Prevention/Management:  . ambulation promoted  . anticoagulant therapy maintained  . dorsiflexion/plantar flexion performed  Intervention: Prevent Infection  Recent Flowsheet Documentation  Taken 03/04/2021 1515 by Pascal Lux, RN  Infection Prevention:  . barrier precautions utilized  . environmental surveillance performed  . glycemic control managed  . personal protective equipment utilized  . promote handwashing  . rest/sleep promoted  Taken 03/04/2021 0277 by Pascal Lux, RN  Infection Prevention:  . barrier precautions utilized  . environmental surveillance performed  . glycemic control managed  . personal protective equipment utilized  . promote handwashing  . rest/sleep promoted  Goal: Optimal Comfort and Wellbeing  03/04/2021 1747 by Pascal Lux, RN  Outcome: Ongoing (see interventions/notes)  03/04/2021 1747 by Pascal Lux, RN  Reactivated  Intervention: Provide Person-Centered Care  Recent Flowsheet Documentation  Taken 03/04/2021 1515 by Pascal Lux, RN  Trust Relationship/Rapport:  . care explained  . choices provided  . emotional support provided  . empathic listening provided  . questions answered  . questions encouraged  . reassurance provided  . thoughts/feelings  acknowledged  Taken 03/04/2021 1200 by Pascal Lux, RN  Trust Relationship/Rapport:  . care explained  . choices provided  . emotional support provided  . empathic listening provided  . questions answered  . questions encouraged  . reassurance provided  . thoughts/feelings acknowledged  Taken 03/04/2021 4128 by Pascal Lux, RN  Trust Relationship/Rapport:  . care explained  . choices provided  . emotional support provided  . empathic listening provided  . questions answered  . questions encouraged  . reassurance provided  . thoughts/feelings acknowledged  Goal: Rounds/Family Conference  03/04/2021 1747 by Pascal Lux, RN  Outcome: Ongoing (see interventions/notes)  03/04/2021 1747 by Pascal Lux, RN  Reactivated     Problem: Skin Injury Risk Increased  Goal: Skin Health and Integrity  03/04/2021 1747 by Pascal Lux, RN  Outcome: Ongoing (see interventions/notes)  03/04/2021 1747 by Pascal Lux, RN  Reactivated  Intervention: Optimize Skin Protection  Recent Flowsheet Documentation  Taken 03/04/2021 1515 by Pascal Lux, RN  Pressure Reduction Techniques: frequent weight shift encouraged  Pressure Reduction Devices: (M,H,VH) Use Repositioning Devices or Pillows  Skin Protection:  . adhesive use limited  . antimicrobial wipes  . incontinence pads utilized  . protective footwear used  . preventative decubiti skin protection foam dressing applied/intact  . transparent dressing maintained  Activity Management:  . activity adjusted per tolerance  . activity encouraged  . dorsiflexion, plantar flexion encouraged  . ROM, active encouraged  . patient refuses activity  Head of Bed [facility]) Positioning: HOB at 20-30 degrees  Taken 03/04/2021 1500 by Pascal Lux, RN  Activity Management: patient refuses activity  Taken 03/04/2021 1400 by Pascal Lux, RN  Activity Management: patient refuses  activity  Taken 03/04/2021 1300 by Pascal Lux, RN  Activity Management: patient refuses activity  Taken 03/04/2021 1200 by Pascal Lux, RN  Pressure Reduction Techniques: frequent weight shift encouraged  Pressure Reduction Devices: (M,H,VH) Use Repositioning Devices or Pillows  Skin Protection:  . adhesive use limited  . antimicrobial wipes  . incontinence pads utilized  . protective footwear used  . preventative decubiti skin protection foam dressing applied/intact  . transparent dressing maintained  Activity Management:  . activity adjusted per tolerance  . activity encouraged  . dorsiflexion, plantar flexion encouraged  . ROM, active encouraged  . patient refuses activity  Head of Bed [facility]) Positioning: HOB at 20-30 degrees  Taken 03/04/2021 1100 by Pascal Lux, RN  Activity Management: patient refuses activity  Taken 03/04/2021 1000 by Pascal Lux, RN  Activity Management: patient refuses activity  Taken 03/04/2021 6222 by Pascal Lux, RN  Pressure Reduction Techniques: frequent weight shift encouraged  Pressure Reduction Devices: (M,H,VH) Use Repositioning Devices or Pillows  Skin Protection:  . adhesive use limited  . antimicrobial wipes  . incontinence pads utilized  . protective footwear used  . preventative decubiti skin protection foam dressing applied/intact  . transparent dressing maintained  Activity Management:  . activity adjusted per tolerance  . activity encouraged  . dorsiflexion, plantar flexion encouraged  . ROM, active encouraged  Head of Bed (HOB) Positioning: HOB at 30-45 degrees     Problem: Hemodialysis  Goal: Safe, Effective Therapy Delivery  Outcome: Ongoing (see interventions/notes)  Goal: Effective Tissue Perfusion  Outcome: Ongoing (see interventions/notes)  Goal: Absence of Infection Signs and Symptoms  Outcome: Ongoing (see interventions/notes)  Intervention: Prevent or Manage Infection  Recent Flowsheet Documentation  Taken 03/04/2021 1515 by Pascal Lux, RN  Infection Prevention:  . barrier precautions utilized  . environmental surveillance performed  . glycemic control managed  . personal protective  equipment utilized  . promote handwashing  . rest/sleep promoted  Taken 03/04/2021 9798 by Pascal Lux, RN  Fever Reduction/Comfort Measures:  . lightweight bedding  . lightweight clothing  Infection Prevention:  . barrier precautions utilized  . environmental surveillance performed  . glycemic control managed  . personal protective equipment utilized  . promote handwashing  . rest/sleep promoted

## 2021-03-04 NOTE — Care Plan (Signed)
[place]  Occupational Therapy Initial Evaluation    Patient Name: Max [name].  Date of Birth: 1970-06-08  Height:    Weight: Weight: 101 kg (222 lb 7.1 oz)  Room/Bed: 25/A  Payor: HU[employer]NA MEDICARE / Plan: HU[employer]NA MEDICARE / Product Type: MEDICARE [facility] /     Assessment:   Patient tolerated OT evaluation well this date. Patient presents with decreased balance, decreased endurance, and with RLE BKA. Patient requires Mod-Min assist x2 with FWW for STS transition and LB dressing. Patient is appropriate to return to SNF when medically appropriate.      Discharge Needs:   Equipment Recommendation: none anticipated      Discharge Disposition: skilled nursing facility    JUSTIFICATION OF DISCHARGE RECOMMENDATION   Based on current diagnosis, functional performance prior to admission, and current functional performance, this patient requires continued OT services in skilled nursing facility  in order to achieve significant functional improvements.    Plan:   Current Intervention: ADL retraining, balance training, endurance training, strengthening, therapeutic exercise, transfer training    To provide Occupational therapy services 1x/day, minimum of 2x/week, until discharge.       The risks/benefits of therapy have been discussed with the patient/caregiver and he/she is in agreement with the established plan of care.       Subjective & Objective        03/04/21 0816   Therapist Pager   OT Assigned/ Pager # [phone]   Rehab Session   Document Type evaluation   Total OT Minutes: 18   Patient Effort good   Symptoms Noted During/After Treatment none   General Information   Patient Profile Reviewed yes   Onset of Illness/Injury or Date of Surgery 02/28/21   Pertinent History of Current Functional Problem 51 y.o. male with past medical history of CAD s/p stents(recent one 3 months ago), HFrEF ,PAD s/p stenting , HTN , DM , ESRD (M/W/F)started 3 months back , Rt BKA , COPD transferred from  outside facility to MICU for management of Hyperkalemia with EKG changes after he missed his HD session   Medical Lines PIV Line;Telemetry;Foley Catheter   Respiratory Status nasal cannula   Existing Precautions/Restrictions full code;fall precautions   Pre Treatment Status   Pre Treatment Patient Status Patient supine in bed;Call light within reach;Telephone within reach;Sitter select activated   Support Present Pre Treatment  None   Communication Pre Treatment  Nurse   Mutuality/Individual Preferences   Individualized Care Needs OOB with Sarastedy x2   Living Environment   Lives With facility resident   [place] patient admitted from [place] equipment and person   Transferring 3 - assistive equipment and person   Toileting 3 - assistive equipment and person   Bathing 2 - assistive person   Dressing 2 - assistive person   Self-Care   Equipment Currently Used at Home yes   Equipment Currently Used at Home wheelchair   Vital Signs   Pre-Treatment Heart Rate (beats/min) 86   Post-treatment Heart Rate (beats/min) 87   Pre SpO2 (%) 97   O2 Delivery Pre Treatment supplemental O2   Post SpO2 (%) 99   O2 Delivery Post Treatment supplemental O2   Vitals Comment 3L via NC maintained throughout   Pain Assessment   Additional Documentation Pain Scale: Numbers Pre/Post-Treatment (Group)   Pain Assessment   Pre/Posttreatment Pain  Comment Patient indicated pain and discomfort to scrotum due to edema; did not rate   Coping/Psychosocial   Observed Emotional State calm;cooperative   Coping/Psychosocial Response Interventions   Plan Of Care Reviewed With patient   Cognitive Assessment/Interventions   Behavior/Mood Observations behavior appropriate to situation, WNL/WFL   Attention WNL/WFL   Follows Commands WNL   RUE Assessment   RUE Assessment WFL- Within Functional Limits   LUE Assessment   LUE Assessment WFL- Within Functional  Limits   Mobility Assessment/Training   Mobility Comment Patient transitioned from supine to sitting EOB with CG. Patient transitioned from STS with FWW and Mod assist x2 via side by side with FWW. Patient returned to sitting EOB, patient completed additional STS with FWW and Min x2. Patient returned to supine in bed with CG assist.   Bed Mobility Assessment/Treatment   Bed Mobility, Assistive Device Head of Bed Elevated   Supine-Sit Independence contact guard assist   Sit to Supine, Independence contact guard assist   Transfer Assessment/Treatment   Sit-Stand Independence moderate assist (50% patient effort);minimum assist (75% patient effort);2 person assist required   Stand-Sit Independence minimum assist (75% patient effort);2 person assist required;verbal cues required   Sit-Stand-Sit, Assist Device walker, front wheeled   Transfer Impairments balance impaired;endurance;strength decreased;postural control impaired   Balance Skill Training   Comment with FWW   Sitting Balance: Static good balance   Sitting, Dynamic (Balance) fair + balance   Sit-to-Stand Balance poor + balance   Standing Balance: Static poor + balance   Standing Balance: Dynamic unable to balance   Post Treatment Status   Post Treatment Patient Status Patient supine in bed;Call light within reach;Telephone within reach;Engineer, petroleum Nurse   Care Plan Goals   OT Rehab Goals Transfer Training Goal 2;Toileting Goal;LB Dressing Goal;Grooming Goal   Grooming Goal   Grooming Goal, Date Established 03/04/21   Grooming Goal, Time to Achieve by discharge   Grooming Goal, Activity Type all grooming tasks   Grooming Goal, Independence  independent   Grooming Goal, Position sitting in chair   LB Dressing Goal   LB Dressing Goal, Date Established 03/04/21   LB Dressing Goal, Time to Achieve by discharge   LB Dressing Goal, Activity Type all lower body dressing tasks   LB  Dressing Goal, Independence Level minimum assist (75% patient effort)   Toileting Goal   Toileting Goal, Date Established 03/04/21   Toileting Goal, Time to Achieve by discharge   Toileting Goal, Activity Type all toileting tasks   Toileting Goal, Independence Level minimum assist (75% patient effort)   Transfer Training Goal 2   Transfer Training Goal, Date Established 03/04/21   Transfer Training Goal, Time to Achieve by discharge   Transfer Training Goal, Activity Type toilet;sit-to-stand/stand-to-sit;bed-to-chair/chair-to-bed   Transfer Training Goal, Independence Level minimum assist (75% patient effort)   Planned Therapy Interventions, OT Eval   Planned Therapy Interventions ADL retraining;balance training;endurance training;strengthening;therapeutic exercise;transfer training   Functional Impairment   Overall Functional Impairments/Problem List balance impaired;endurance;postural control impaired;strength decreased;ROM decreased;coordination impaired   Clinical Impression   Functional Level at Time of Session Patient tolerated OT evaluation well this date. Patient presents with decreased balance, decreased endurance, and with RLE BKA. Patient requires Mod-Min assist x2 with FWW for STS transition and LB dressing. Patient is appropriate to return to SNF when medically appropriate.   Criteria for Skilled Therapeutic Interventions Met (OT) yes;skilled treatment is necessary  Rehab Potential good   Therapy Frequency 1x/day;minimum of 2x/week   Predicted Duration of Therapy until discharge   Anticipated Equipment Needs at Discharge none anticipated   Anticipated Discharge Disposition skilled nursing facility   Highest level of Mobility score   Exercise/Activity Level Performed 3- Sat at edge of bed   Evaluation Complexity Justification   Occupational Profile Review Expanded review   Performance Deficits 3-5 deficits   Clinical Decision Making Moderate analytic complexity   Evaluation Complexity Moderate        Therapist:   Trellis Moment, OT   Pager #: [phone]

## 2021-03-04 NOTE — Care Management Notes (Signed)
[facility]  Care Management Note    Patient Name: Max [name].  Date of Birth: [birth year]  Sex: male  Date/Time of Admission: 02/28/2021  2:08 AM  Room/Bed: 25/A  Payor: HUMANA MEDICARE / Plan: HUMANA MEDICARE / Product Type: MEDICARE [facility] /    LOS: 4 days   Primary Care Providers:  Azzie Roup, MD, MD (General)    Admitting Diagnosis:  Hyperkalemia [E87.5]    Assessment:      03/04/21 1159   Medicare Intent to Discharge Documentation   Discharge IMM give to: Patient   Discharge IMM Letter Given Date 03/04/21   Discharge IMM Letter Given Time 1100   IMM explained/reviewed with:  Patient;verbalized understanding   Care Management Plan   Discharge Planning Status plan in progress   Projected Discharge Date 03/05/21   Discharge plan discussed with: Patient   CM will evaluate for rehabilitation potential yes   Patient choice offered to patient/family yes   Form for patient choice reviewed/signed and on chart yes   Facility or [place]   Discharge Needs Assessment   Discharge Facility/Level of Care Needs SNF Return (Medicare certified)(code 3)   Transportation Available ambulance   Referral Information   Admission Type inpatient       Discharge Plan:  SNF Return (Medicare certified) (code 3)  PT/OT notes in. Liaison, Tonya aware and submitting for auth. FOC and IMM completed, declined copy and signed copies placed in CM assistants office. EMS placed on will call for tomorrow. CMN form initiated. Pt does not need a new PAS or COVID.     The patient will continue to be evaluated for developing discharge needs.     Case Manager: Hardin Negus, [place] COORDINATOR  Phone: [phone]

## 2021-03-04 NOTE — Anticoag (Signed)
[place] / [place]    Pharmacist Consult for Warfarin Dosing- Progress Note      Max [name]. is an 51 y.o. patient on warfarin for VTE to target a goal INR of 2 - 3. Warfarin was a continuation from prior to admission    Prior to Admission Regimen: Warfarin 4 mg daily  Pertinent details on prior to admission dosing schedule (missed/held doses, etc.): INR supratherapeutic on admission (3.9)    Most Recent Inpatient Labs  Lab Results   Component Value Date    HGB 7.1 (L) 03/04/2021    HCT 23.5 (L) 03/04/2021    ALBUMIN 2.0 (L) 02/28/2021    RBC 2.56 (L) 03/04/2021    PROTHROMTME 19.6 (H) 03/04/2021    APTT 34.6 08/26/2019    TOTBILIRUBIN 0.6 02/28/2021    AST 37 02/28/2021    ALT 10 02/28/2021    PLTCNT 398 03/04/2021       Current Diet Order:  MNT PROTOCOL FOR DIETITIAN  DIET DIABETIC Calorie amount: CC 2000; Additional modifications/limitations: LOW PHOSPHORUS, 2 G POTASSIUM  DIETARY ORAL SUPPLEMENTS Oral Supplements with tray: Ensure Enlive-Chocolate; LUNCH/DINNER; 1 Each       Inpatient Warfarin Dosing:     Date  INR  Warfarin    Dose   Bridging    Regimen              (if applicable)   Concurrent        Antiplatelet(s)  Drug Interactions  Comments   03/01/21 1.6 4 mg none      2/21 1.43 4mg  none      2/22 1.42 4mg  none      2/23 1.69 4mg  none                   Assessment & Plan    . The INR today is SUBtherapeutic  . Give warfarin 4 mg tonight.  . Recheck INR in AM  . Monitor for signs/symptoms of bleeding  . Plan for outpatient management (if known) per primary team or care management:     To be determined           Please contact team pharmacist with any questions about warfarin management.

## 2021-03-04 NOTE — Care Plan (Signed)
[place]  Physical Therapy Initial Evaluation    Patient Name: Max [name].  Date of Birth: [birth year]  Height:    Weight: Weight: 101 kg (222 lb 7.1 oz)  Room/Bed: 25/A  Payor: HUMANA MEDICARE / Plan: HUMANA MEDICARE / Product Type: MEDICARE [facility] /     Assessment:      Max [name] presents with what appears to be his baseline functional strength and mobility. He reports he performs lateral transfers at his  SNF, and he remains able based on this exam. He is also able to perform his own bed mobility. He was unable to stand adequately to work on single leg ambulation    Discharge Needs:   Equipment Recommendation: none anticipated     Discharge Recommendation: skilled nursing facility  JUSTIFICATION OF DISCHARGE RECOMMENDATION   Based on current diagnosis, functional performance prior to admission, and current functional performance, this patient requires continued PT services in skilled nursing facility in order to achieve significant functional improvements in these deficit areas: aerobic capacity/endurance, gait, locomotion, and balance, muscle performance. The above recommendations are based upon the current examination and evaluation performed on this date. As subsequent sessions are completed, recommendations will be updated accordingly.    Plan:   Current Intervention: balance training, bed mobility training, home exercise program, strengthening, transfer training, patient/family education  To provide physical therapy services minimum of 2x/week  for duration of until discharge.    The risks/benefits of therapy have been discussed with the patient/caregiver and he/she is in agreement with the established plan of care.       Subjective & Objective        03/04/21 0817   Rehab Session   Document Type evaluation   Total PT Minutes: 18   Patient Effort good   General Information   Pertinent History of Current Functional Problem 51 y.o. male with past medical history of CAD  s/p stents(recent one 3 months ago), HFrEF ,PAD s/p stenting , HTN , DM , ESRD (M/W/F)started 3 months back , Rt BKA , COPD transferred from outside facility to MICU for management of Hyperkalemia with EKG changes after he missed his HD session   Respiratory Status nasal cannula   Existing Precautions/Restrictions fall precautions   Mutuality/Individual Preferences   Individualized Care Needs Max [name] stedy for OOB transfers   Max [name] With facility resident   Living Arrangements *nursing home   Functional Level Prior   Ambulation 3 - assistive equipment and person   Transferring 3 - assistive equipment and person   Prior Functional Level Comment performs pivot transfers/sliding transfers to [facility], wheelchair at baseline. Feels he is at his baseline, and has been doing this transfer here   Pre Treatment Status   Communication Pre Treatment  Nurse   Cognitive Assessment/Interventions   Behavior/Mood Observations alert;cooperative   Vital Signs   Pre-Treatment Heart Rate (beats/min) 86   Post-treatment Heart Rate (beats/min) 87   Pre SpO2 (%) 97   O2 Delivery Pre Treatment supplemental O2   Post SpO2 (%) 99   O2 Delivery Post Treatment supplemental O2   Vitals Comment 3L   Pain Assessment   Pre/Posttreatment Pain Comment Scrotal edmea discomfort, no c/o pain   General Extremity Assessment   Comment UEs 4+/5, R BKA, LLE 3+/5   Bed Mobility Assessment/Treatment   Bed Mobility, Assistive Device bed rails;Head of Bed Elevated   Supine-Sit Independence contact guard assist   Sit to Supine, Independence contact  guard assist   Transfer Assessment/Treatment   Sit-Stand Independence moderate assist (50% patient effort);2 person assist required;verbal cues required   Stand-Sit Independence minimum assist (75% patient effort);2 person assist required;verbal cues required   Sit-Stand-Sit, Assist Device walker, front wheeled   Transfer Impairments balance impaired;endurance;strength decreased;postural control impaired    Transfer Comment Requires foot block, assist for ascent. U nable to achieve static standing balance, but 2nd attempt was improved from first   Gait Assessment/Treatment   Independence  unable to perform   Balance Skill Training   Comment with FWW   Sitting Balance: Static good balance   Sitting, Dynamic (Balance) fair + balance   Sit-to-Stand Balance unable to balance   Standing Balance: Static unable to balance   Standing Balance: Dynamic unable to balance   Post Treatment Status   Communication Post Treatement Nurse   Basic Mobility Am-PAC/6Clicks Score (APPROVED PT Staff, [facility], [place], [place], [place], and [facility])   Turning in bed without bedrails 4   Lying on back to sitting on edge of flat bed 3   Moving to and from a bed to a chair 3   Standing up from chair 1   Walk in room 1   Climbing 3-5 steps with railing 1   6 Clicks Raw Score total 13   Standardized (t-scale) score 33.99   CMS 0-100% Score 57.65   CMS Modifier CK   Exercise/Activity Level Performed 3- Sat at edge of bed   Physical Therapy Clinical Impression   Assessment Max [name] presents with what appears to be his baseline functional strength and mobility. He reports he performs lateral transfers at his  SNF, and he remains able based on this exam. He is also able to perform his own bed mobility. He was unable to stand adequately to work on single leg ambulation   Patient/Family Goals Statement return to SNF   Criteria for Skilled Therapeutic yes   Pathology/Pathophysiology Noted musculoskeletal;cardiovascular;pulmonary   Impairments Found (describe specific impairments) aerobic capacity/endurance;gait, locomotion, and balance;muscle performance   Functional Limitations in Following  self-care;home management;community/leisure   Disability: Inability to Perform community/leisure   Rehab Potential good   Therapy Frequency minimum of 2x/week   Predicted Duration of Therapy Intervention (days/wks) until discharge   Anticipated Equipment Needs at  Discharge (PT) none anticipated   Anticipated Discharge Disposition skilled nursing facility   Evaluation Complexity Justification   Patient History: Co-morbidity/factors that impact Plan of Care 3 or more that impact Plan of Care   Examination Components 4 or more Exam elements addressed   Presentation Evolving: Symptoms, complaints, characteristics of condition changing &/or cognitive deficits present   Clinical Decision Making Moderate complexity   Evaluation Complexity Moderate complexity   Care Plan Goals   PT Rehab Goals Bed Mobility Goal;Gait Training Goal;Transfer Training Goal   Bed Mobility Goal   Bed Mobility Goal, Time to Achieve by discharge   Bed Mobility Goal, Activity Type all bed mobility activities   Bed Mobility Goal, Independence Level independent   Transfer Training Goal   Transfer Training Goal, Time to Achieve by discharge   Transfer Training Goal, Activity Type all transfers   Transfer Training Goal, Independence Level supervision required   Planned Therapy Interventions, PT Eval   Planned Therapy Interventions (PT) balance training;bed mobility training;home exercise program;strengthening;transfer training;patient/family education       Therapist:   Rayvon Char. Rozann Lesches, PT, DPT, [place]  Pager #: [phone]

## 2021-03-04 NOTE — Discharge Instructions (Addendum)
Nursing call report to [facility]  [phone]

## 2021-03-05 ENCOUNTER — Inpatient Hospital Stay (HOSPITAL_COMMUNITY): Payer: Medicare (Managed Care)

## 2021-03-05 ENCOUNTER — Other Ambulatory Visit: Payer: Self-pay

## 2021-03-05 LAB — CBC WITH DIFF
BASOPHIL #: <0.1 10*3/uL (ref <=0.20)
BASOPHIL %: 1 %
EOSINOPHIL #: 0.27 10*3/uL (ref <=0.50)
EOSINOPHIL %: 3 %
HCT: 23.8 % — ABNORMAL LOW (ref 38.9–52.0)
HGB: 7.3 g/dL — ABNORMAL LOW (ref 13.4–17.5)
IMMATURE GRANULOCYTE #: <0.1 10*3/uL (ref <0.10)
IMMATURE GRANULOCYTE %: 1 % (ref 0–1)
LYMPHOCYTE #: 2.2 10*3/uL (ref 1.00–4.80)
LYMPHOCYTE %: 22 %
MCH: 28 pg (ref 26.0–32.0)
MCHC: 30.7 g/dL — ABNORMAL LOW (ref 31.0–35.5)
MCV: 91.2 fL (ref 78.0–100.0)
MONOCYTE #: 2.31 10*3/uL — ABNORMAL HIGH (ref 0.20–1.10)
MONOCYTE %: 23 %
MPV: 9.5 fL (ref 8.7–12.5)
NEUTROPHIL #: 5.33 10*3/uL (ref 1.50–7.70)
NEUTROPHIL %: 50 %
PLATELETS: 411 10*3/uL — ABNORMAL HIGH (ref 150–400)
RBC: 2.61 10*6/uL — ABNORMAL LOW (ref 4.50–6.10)
RDW-CV: 16.3 % — ABNORMAL HIGH (ref 11.5–15.5)
WBC: 10.2 10*3/uL (ref 3.7–11.0)

## 2021-03-05 LAB — POC BLOOD GLUCOSE (RESULTS)
GLUCOSE, POC: 169 mg/dl — ABNORMAL HIGH (ref 70–105)
GLUCOSE, POC: 179 mg/dl — ABNORMAL HIGH (ref 70–105)
GLUCOSE, POC: 205 mg/dl — ABNORMAL HIGH (ref 70–105)
GLUCOSE, POC: 216 mg/dl — ABNORMAL HIGH (ref 70–105)
GLUCOSE, POC: 261 mg/dl — ABNORMAL HIGH (ref 70–105)

## 2021-03-05 LAB — PT/INR
INR: 2.17 — ABNORMAL HIGH (ref 0.80–1.20)
PROTHROMBIN TIME: 25.3 seconds — ABNORMAL HIGH (ref 9.1–13.9)

## 2021-03-05 LAB — BASIC METABOLIC PANEL
ANION GAP: 9 mmol/L (ref 4–13)
BUN/CREA RATIO: 14 (ref 6–22)
BUN: 52 mg/dL — ABNORMAL HIGH (ref 8–25)
CALCIUM: 8.7 mg/dL (ref 8.5–10.0)
CHLORIDE: 101 mmol/L (ref 96–111)
CO2 TOTAL: 22 mmol/L (ref 22–30)
CREATININE: 3.59 mg/dL — ABNORMAL HIGH (ref 0.75–1.35)
ESTIMATED GFR: 20 mL/min/BSA — ABNORMAL LOW (ref >=60)
GLUCOSE: 159 mg/dL — ABNORMAL HIGH (ref 65–125)
POTASSIUM: 5.6 mmol/L — ABNORMAL HIGH (ref 3.5–5.1)
SODIUM: 132 mmol/L — ABNORMAL LOW (ref 136–145)

## 2021-03-05 LAB — MAGNESIUM: MAGNESIUM: 1.5 mg/dL — ABNORMAL LOW (ref 1.8–2.6)

## 2021-03-05 LAB — ADULT ROUTINE BLOOD CULTURE, SET OF 2 BOTTLES (BACTERIA AND YEAST): BLOOD CULTURE, ROUTINE: NO GROWTH

## 2021-03-05 LAB — PHOSPHORUS: PHOSPHORUS: 4.3 mg/dL (ref 2.4–4.7)

## 2021-03-05 MED ORDER — SODIUM CITRATE 4 % (3 ML) INTRA-CATHETER INJECTION SYRINGE
2.0000 | INJECTION | Status: AC
Start: 2021-03-05 — End: 2021-03-05
  Administered 2021-03-05: 2
  Filled 2021-03-05: qty 6

## 2021-03-05 NOTE — Nurses Notes (Signed)
Hemodialysis tx completed.  3.5kg removed.  Patient tolerated tx well.  Dressing changed to right TCC.  Site clean and dry.  Patient back to room via bed in stable condition.  Report given.

## 2021-03-05 NOTE — Consults (Signed)
[facility]   Section of Nephrology  ESRD Follow-up Consult Note    Max [name], Max [name]. 51 y.o. male  Date of Admission:  02/28/2021  Date of Birth:  1970/10/03  Date of service: 03/05/2021      REASON FOR CONSULT:Dialysis Management     CONSULTING PHYSICIAN:MICU     ASSESSMENT/RECOMMENDATIONS:    51 y.o. male with past medical history of CAD s/p stents(recent one 3 months ago), HFrEF ,PAD s/p stenting , HTN , DM , ESRD (M/W/F)started 3 months back , Rt BKA , COPD transferred from outside facility to MICU for management of Hyperkalemia with EKG changes after he missed his HD session.    ESRD on HD MWF  Follows at [facility] and undergoes dialysis on M/W/F via Rt TCC    Electrolytes- K 5.8  Acid Base- Stable  Volume status- Euvolemic  CKD [place] Bone- Ca,  phos on target.   Anemia- Stable   Antibiotics- none    Recs:  - s/p Emergent HD on 2/19 early AM 2/2 Hyperkalemia with EKG changes  - HD today as per outpatient MWF schedule. 4hrs, 3-3.5L UF as tolerated, 2K bath for hyperkalemia.  - Maintain MAP > 53mmHg to optimize hemodynamic status  - Avoid nephrotoxins, adjust meds to appropriate GFR  - Daily BMP, mag, phos  - Optimize nutrition   - Strict I/O      SUBJECTIVE:  Seen during HD treatment. Tolerating well w/o complaints.     Inpatient Medications:  [Held by provider] amLODIPine (NORVASC) tablet, 10 mg, Oral, Daily  aspirin chewable tablet 81 mg, 81 mg, Oral, Daily  atorvastatin (LIPITOR) tablet, 80 mg, Oral, QPM  carvedilol (COREG) tablet, 25 mg, Oral, 2x/day-Food  [Held by provider] cyclobenzaprine (FLEXERIL) tablet, 5 mg, Oral, Q8H PRN  D5W 250 mL flush bag, , Intravenous, Q15 Min PRN  DULoxetine (CYMBALTA) delayed release capsule, 60 mg, Oral, Daily  ezetimibe (ZETIA) tablet, 10 mg, Oral, QPM  ferrous sulfate 324 mg (65 mg elemental IRON) tablet, 324 mg, Oral, Daily before Breakfast  gabapentin (NEURONTIN) capsule, 400 mg, Oral, 3x/day  insulin glargine-yfgn 100 units/mL injection, 8 Units,  Subcutaneous, Daily  isosorbide mononitrate (IMDUR) 24 hr extended release tablet, 60 mg, Oral, Daily  lidocaine-menthol (LIDOPATCH) 3.6%-1.25% patch, 1 Patch, Transdermal, Daily  [Held by provider] lisinopril (PRINIVIL) tablet, 5 mg, Oral, Daily  NS 250 mL flush bag, , Intravenous, Q15 Min PRN  NS flush syringe, 2-6 mL, Intracatheter, Q8HRS  NS flush syringe, 2-6 mL, Intracatheter, Q1 MIN PRN  ondansetron (ZOFRAN) 2 mg/mL injection, 4 mg, Intravenous, Q8H PRN  oxyCODONE (ROXICODONE) immediate release tablet, 5 mg, Oral, Q6H PRN  pantoprazole (PROTONIX) delayed release tablet, 40 mg, Oral, Daily before Breakfast  polyethylene glycol (MIRALAX) oral packet, 17 g, Oral, Daily  ranolazine (RANEXA) extended release tablet, 500 mg, Oral, 2x/day  sennosides-docusate sodium (SENOKOT-S) 8.6-50mg  per tablet, 1 Tablet, Oral, 2x/day  sevelamer carbonate (RENVELA) tablet, 800 mg, Oral, 3x/day-Meals  sodium zirconium cyclosilicate (LOKELMA) powder, 10 g, Oral, Daily  SSIP insulin lispro 100 units/mL injection, 0-12 Units, Subcutaneous, Q4H PRN  torsemide (DEMADEX) tablet, 20 mg, Oral, 2x/day  warfarin (COUMADIN) tablet, 4 mg, Oral, NIGHTLY  Warfarin - Pharmacist to Dose per Protocol, , Does not apply, Daily PRN      Objective:  Filed Vitals:    03/05/21 0315 03/05/21 0356 03/05/21 0715 03/05/21 0815   BP: 108/67  126/64 120/65   Pulse: 73  75 76   Resp:  16 16  Temp: 36.5 C (97.7 F)  36.5 C (97.7 F)    SpO2: 99%  99%      General :on 3L NC (baseline)  Lungs: crackles+   IWP:[id number]   Access: rt TCC   Rt BKA     I&O:  02/23 0700 - 02/24 0659  In: 2054 [P.O.:1994; Other:60]  Out: 450 [Urine:450]     Labs:  Recent Labs     03/03/21  0030 03/04/21  0248 03/05/21  0520   SODIUM 130* 133* 132*   POTASSIUM 5.8* 5.5* 5.6*   CHLORIDE 100 103 101   CO2 22 23 22    BUN 49* 34* 52*   CREATININE 3.83* 2.93* 3.59*   GFR 19* 26* 20*   ANIONGAP 8 7 9      Recent Labs     03/03/21  0030 03/04/21  0248 03/05/21  0520   CALCIUM 8.9 8.5 8.7    MAGNESIUM 1.6*  --  1.5*   PHOSPHORUS 4.8*  --  4.3     Recent Labs     03/03/21  2142 03/04/21  0248 03/05/21  0520   WBC 9.9 10.3 10.2   HGB 7.1* 7.1* 7.3*   HCT 23.5* 23.5* 23.8*   PLTCNT 401* 398 411*     No results found for this encounter    Thank you for the consult.  Will continue to follow.   Please call with any questions.    Raina Mina, MD   Nephrology Fellow, PGY-V   Nephrology Consult Team [facility]    I saw and examined the patient.  I reviewed the fellow's note.  I agree with the findings and plan of care as documented in the fellow's note.  Any exceptions/additions are edited/noted.    Clydia Llano, MD

## 2021-03-05 NOTE — Care Management Notes (Signed)
[facility]  Care Management Note    Patient Name: Max [name].  Date of Birth: 1970-12-26  Sex: male  Date/Time of Admission: 02/28/2021  2:08 AM  Room/Bed: 25/A  Payor: HUMANA MEDICARE / Plan: HUMANA MEDICARE / Product Type: MEDICARE [facility] /    LOS: 5 days   Primary Care Providers:  Azzie Roup, MD, MD (General)    Admitting Diagnosis:  Hyperkalemia [E87.5]    Assessment:      03/05/21 1157   Assessment Details   Assessment Type Continued Assessment   Date of Care Management Update 03/05/21   Date of Next DCP Update 03/08/21   Medicare Intent to Discharge Documentation   Discharge IMM give to: Patient   Discharge IMM Letter Given Date 03/04/21   Discharge IMM Letter Given Time 1100   IMM explained/reviewed with:  Patient;verbalized understanding   Care Management Plan   Discharge Planning Status plan in progress   Projected Discharge Date 03/05/21   Discharge plan discussed with: Patient   CM will evaluate for rehabilitation potential yes   Patient choice offered to patient/family yes   Form for patient choice reviewed/signed and on chart yes   Facility or [place]   Discharge Needs Assessment   Discharge Facility/Level of Care Needs SNF Return (Medicare certified)(code 3)   Transportation Available ambulance       Plan is for Pt to return to his SNF at [facility] via EMS once insurance [name] is obtained.   Choice and IMM completed. EMS is on will call for 2/24.     Spoke with [name] at [place], the center is ready to accept Pt once insurance [name] is approved.     Will follow for d/c planning.     Discharge Plan:  SNF Return (Medicare certified) (code 3)      The patient will continue to be evaluated for developing discharge needs.     Case Manager: Nathen May, [place]  Phone: [phone]

## 2021-03-05 NOTE — Anticoag (Signed)
[place] / [place]    Pharmacist Consult for Warfarin Dosing- Progress Note      Max [name]. is an 51 y.o. patient on warfarin for VTE to target a goal INR of 2 - 3. Warfarin was a continuation from prior to admission    Prior to Admission Regimen: Warfarin 4 mg daily  Pertinent details on prior to admission dosing schedule (missed/held doses, etc.): INR supratherapeutic on admission (3.9)    Most Recent Inpatient Labs  Lab Results   Component Value Date    HGB 7.3 (L) 03/05/2021    HCT 23.8 (L) 03/05/2021    ALBUMIN 2.0 (L) 02/28/2021    RBC 2.61 (L) 03/05/2021    PROTHROMTME 25.3 (H) 03/05/2021    APTT 34.6 08/26/2019    TOTBILIRUBIN 0.6 02/28/2021    AST 37 02/28/2021    ALT 10 02/28/2021    PLTCNT 411 (H) 03/05/2021       Current Diet Order:  MNT PROTOCOL FOR DIETITIAN  DIET DIABETIC Calorie amount: CC 2000; Additional modifications/limitations: LOW PHOSPHORUS, 2 G POTASSIUM  DIETARY ORAL SUPPLEMENTS Oral Supplements with tray: Ensure Enlive-Chocolate; LUNCH/DINNER; 1 Each       Inpatient Warfarin Dosing:     Date  INR  Warfarin    Dose   Bridging    Regimen              (if applicable)   Concurrent        Antiplatelet(s)  Drug Interactions  Comments   03/01/21 1.6 4 mg none      2/21 1.43 4mg  none      2/22 1.42 4mg  none      2/23 1.69 4mg  none      2/24 2.17 4mg  none          Assessment & Plan    . The INR today is Therapeutic  . Give warfarin 4 mg tonight.  . Recheck INR in AM  . Monitor for signs/symptoms of bleeding  . Plan for outpatient management (if known) per primary team or care management:     To be determined           Please contact team pharmacist with any questions about warfarin management.

## 2021-03-05 NOTE — Care Plan (Signed)
VSS and assessments per flow sheets. Patient had dialysis today for removal of 3.5L of fluid. Tolerated well by patient. Patient refusing activity when offered. He remains lethargic. Patient medicated for pain with prn oxy with adequate relief. O2 sats > 94% on 3L NC. No cough/SOB noted. Monitor NSR. Fall/skin and isolation precautions  maintained. Call light with in reach. Safety checks completed and SS in place. Plan is for patient to be discharged back to nursing home with insurance approval when medically stable.   Problem: Adult Inpatient Plan of Care  Goal: Plan of Care Review  Outcome: Ongoing (see interventions/notes)  Goal: Patient-Specific Goal (Individualized)  Outcome: Ongoing (see interventions/notes)  Flowsheets (Taken 03/05/2021 1644)  Individualized Care Needs: pain control, encourage patient to be OOB to chair for meals  Anxieties, Fears or Concerns: "When can I go home/"  Patient/Family-Specific Goals (Include Timeframe): To return to nursing home tomorrow  Goal: Absence of Hospital-Acquired Illness or Injury  Outcome: Ongoing (see interventions/notes)  Intervention: Identify and Manage Fall Risk  Recent Flowsheet Documentation  Taken 03/05/2021 1600 by Pascal Lux, RN  Safety Promotion/Fall Prevention: safety round/check completed  Taken 03/05/2021 1500 by Pascal Lux, RN  Safety Promotion/Fall Prevention: safety round/check completed  Taken 03/05/2021 1400 by Pascal Lux, RN  Safety Promotion/Fall Prevention: safety round/check completed  Taken 03/05/2021 1352 by Pascal Lux, RN  Safety Promotion/Fall Prevention: safety round/check completed  Taken 03/05/2021 0715 by Pascal Lux, RN  Safety Promotion/Fall Prevention:   activity supervised   fall prevention program maintained   muscle strengthening facilitated   motion sensor pad activated   nonskid shoes/slippers when out of bed   safety round/check completed  Intervention: Prevent Skin Injury  Recent Flowsheet Documentation  Taken 03/05/2021  1600 by Pascal Lux, RN  Body Position:   supine, head elevated   positioned/repositioned independently  Skin Protection:   adhesive use limited   antimicrobial wipes   incontinence pads utilized   protective footwear used   preventative decubiti skin protection foam dressing applied/intact   transparent dressing maintained  Taken 03/05/2021 0715 by Pascal Lux, RN  Body Position:   supine, head elevated   positioned/repositioned independently  Skin Protection:   adhesive use limited   antimicrobial wipes   incontinence pads utilized   protective footwear used   preventative decubiti skin protection foam dressing applied/intact   transparent dressing maintained   tubing/devices free from skin contact  Intervention: Prevent and Manage VTE (Venous Thromboembolism) Risk  Recent Flowsheet Documentation  Taken 03/05/2021 1600 by Pascal Lux, RN  VTE Prevention/Management:   ambulation promoted   anticoagulant therapy maintained   dorsiflexion/plantar flexion performed  Taken 03/05/2021 0715 by Pascal Lux, RN  VTE Prevention/Management:   ambulation promoted   anticoagulant therapy maintained   dorsiflexion/plantar flexion performed  Intervention: Prevent Infection  Recent Flowsheet Documentation  Taken 03/05/2021 1600 by Pascal Lux, RN  Infection Prevention:   barrier precautions utilized   environmental surveillance performed   glycemic control managed   personal protective equipment utilized   promote handwashing   rest/sleep promoted  Taken 03/05/2021 0715 by Pascal Lux, RN  Infection Prevention:   barrier precautions utilized   environmental surveillance performed   equipment surfaces disinfected   glycemic control managed   personal protective equipment utilized   promote handwashing   rest/sleep promoted  Goal: Optimal Comfort and Wellbeing  Outcome: Ongoing (see interventions/notes)  Intervention: Provide Person-Centered Care  Recent Flowsheet Documentation  Taken 03/05/2021 1600 by Pascal Lux,  RN  Trust Relationship/Rapport:   care explained   choices provided   emotional support provided   empathic listening provided   questions answered   questions encouraged   reassurance provided   thoughts/feelings acknowledged  Taken 03/05/2021 0715 by Pascal Lux, [place] Relationship/Rapport:   care explained   choices provided   emotional support provided   empathic listening provided   questions answered   questions encouraged   reassurance provided   thoughts/feelings acknowledged  Goal: Rounds/Family Conference  Outcome: Ongoing (see interventions/notes)     Problem: Skin Injury Risk Increased  Goal: Skin Health and Integrity  Outcome: Ongoing (see interventions/notes)  Intervention: Optimize Skin Protection  Recent Flowsheet Documentation  Taken 03/05/2021 1600 by Pascal Lux, RN  Pressure Reduction Techniques: frequent weight shift encouraged  Pressure Reduction Devices: (M,H,VH) Use Repositioning Devices or Pillows  Skin Protection:   adhesive use limited   antimicrobial wipes   incontinence pads utilized   protective footwear used   preventative decubiti skin protection foam dressing applied/intact   transparent dressing maintained  Activity Management:   activity adjusted per tolerance   activity encouraged   dorsiflexion, plantar flexion encouraged   patient refuses activity   ROM, active encouraged  Head of Bed [facility]) Positioning: HOB at 30-45 degrees  Taken 03/05/2021 1500 by Pascal Lux, RN  Activity Management: patient refuses activity  Taken 03/05/2021 1400 by Pascal Lux, RN  Activity Management: patient refuses activity  Taken 03/05/2021 0715 by Pascal Lux, RN  Pressure Reduction Techniques: frequent weight shift encouraged  Pressure Reduction Devices: (M,H,VH) Use Repositioning Devices or Pillows  Skin Protection:   adhesive use limited   antimicrobial wipes   incontinence pads utilized   protective footwear used   preventative decubiti skin protection foam dressing applied/intact    transparent dressing maintained   tubing/devices free from skin contact  Activity Management:   activity adjusted per tolerance   activity encouraged   dorsiflexion, plantar flexion encouraged   ROM, active encouraged  Head of Bed (HOB) Positioning: HOB at 30-45 degrees     Problem: Hemodialysis  Goal: Safe, Effective Therapy Delivery  Outcome: Ongoing (see interventions/notes)  Goal: Effective Tissue Perfusion  Outcome: Ongoing (see interventions/notes)  Goal: Absence of Infection Signs and Symptoms  Outcome: Ongoing (see interventions/notes)  Intervention: Prevent or Manage Infection  Recent Flowsheet Documentation  Taken 03/05/2021 1600 by Pascal Lux, RN  Infection Prevention:   barrier precautions utilized   environmental surveillance performed   glycemic control managed   personal protective equipment utilized   promote handwashing   rest/sleep promoted  Taken 03/05/2021 0715 by Pascal Lux, RN  Fever Reduction/Comfort Measures:   lightweight bedding   lightweight clothing  Infection Prevention:   barrier precautions utilized   environmental surveillance performed   equipment surfaces disinfected   glycemic control managed   personal protective equipment utilized   promote handwashing   rest/sleep promoted

## 2021-03-05 NOTE — Progress Notes (Signed)
[facility]  Medicine Progress Note    [name].  Date of service: 03/05/2021  Date of Admission:  02/28/2021    Hospital Day:  LOS: 5 days      Subjective: Evaluated patient in HD.  No issues overnight.  Denies any worsening dyspnea or chest pain.  Swelling improving.  No new complaints.     Vital Signs:  Temp  Avg: 36.6 C (97.8 F)  Min: 36.4 C (97.6 F)  Max: 36.9 C (98.4 F)    Pulse  Avg: 77.4  Min: 71  Max: 85 BP  Min: 107/90  Max: 126/64   Resp  Avg: 16.1  Min: 15  Max: 17 SpO2  Avg: 96.9 %  Min: 92 %  Max: 100 %          Input/Output    Intake/Output Summary (Last 24 hours) at 03/05/2021 0844  Last data filed at 03/05/2021 0532  Gross per 24 hour   Intake 1457 ml   Output 450 ml   Net 1007 ml    I/O last shift:  No intake/output data recorded.   [Held by provider] amLODIPine (NORVASC) tablet, 10 mg, Oral, Daily  aspirin chewable tablet 81 mg, 81 mg, Oral, Daily  atorvastatin (LIPITOR) tablet, 80 mg, Oral, QPM  carvedilol (COREG) tablet, 25 mg, Oral, 2x/day-Food  [Held by provider] cyclobenzaprine (FLEXERIL) tablet, 5 mg, Oral, Q8H PRN  D5W 250 mL flush bag, , Intravenous, Q15 Min PRN  DULoxetine (CYMBALTA) delayed release capsule, 60 mg, Oral, Daily  ezetimibe (ZETIA) tablet, 10 mg, Oral, QPM  ferrous sulfate 324 mg (65 mg elemental IRON) tablet, 324 mg, Oral, Daily before Breakfast  gabapentin (NEURONTIN) capsule, 400 mg, Oral, 3x/day  insulin glargine-yfgn 100 units/mL injection, 8 Units, Subcutaneous, Daily  isosorbide mononitrate (IMDUR) 24 hr extended release tablet, 60 mg, Oral, Daily  lidocaine-menthol (LIDOPATCH) 3.6%-1.25% patch, 1 Patch, Transdermal, Daily  [Held by provider] lisinopril (PRINIVIL) tablet, 5 mg, Oral, Daily  NS 250 mL flush bag, , Intravenous, Q15 Min PRN  NS flush syringe, 2-6 mL, Intracatheter, Q8HRS  NS flush syringe, 2-6 mL, Intracatheter, Q1 MIN PRN  ondansetron (ZOFRAN) 2 mg/mL injection, 4 mg, Intravenous, Q8H PRN  oxyCODONE (ROXICODONE) immediate release  tablet, 5 mg, Oral, Q6H PRN  pantoprazole (PROTONIX) delayed release tablet, 40 mg, Oral, Daily before Breakfast  polyethylene glycol (MIRALAX) oral packet, 17 g, Oral, Daily  premixed hemodialysate (NATURALYTE) 3.43 L with potassium chloride 2 mEq/L, calcium chloride 2.5 mEq/L, , Hemodialysis, Give in Dialysis  ranolazine (RANEXA) extended release tablet, 500 mg, Oral, 2x/day  sennosides-docusate sodium (SENOKOT-S) 8.6-50mg  per tablet, 1 Tablet, Oral, 2x/day  sevelamer carbonate (RENVELA) tablet, 800 mg, Oral, 3x/day-Meals  sodium zirconium cyclosilicate (LOKELMA) powder, 10 g, Oral, Daily  SSIP insulin lispro 100 units/mL injection, 0-12 Units, Subcutaneous, Q4H PRN  torsemide (DEMADEX) tablet, 20 mg, Oral, 2x/day  warfarin (COUMADIN) tablet, 4 mg, Oral, NIGHTLY  Warfarin - Pharmacist to Dose per Protocol, , Does not apply, Daily PRN    Physical Exam:  General Appearance: Chronically ill-appearing, and vital signs reviewed  Eyes: Conjunctiva clear, pupils equal and round, sclera non-icteric, EOMI  Neck: Supple, trachea midline  Lungs: Clear to auscultation bilaterally, no wheezes, breathing comfortably on NC  Heart: S1S2, RRR  Abdomen: Soft, non-tender, non-distended, normoactive bowel sounds  Extremities/MSK: 2+ edema, right BKA, moves all 4 extremities  Integumentary:  Skin warm and dry, no rashes, pale  Neurologic: Grossly normal, somnolent, but able to answer questions, no focal  neurologic deficits      Labs:  I reviewed labs  CBC Results Differential Results   Recent Results (from the past 30 hour(s))   CBC WITH DIFF    Collection Time: 03/05/21  5:20 AM   Result Value    WBC 10.2    HGB 7.3 (L)    HCT 23.8 (L)    PLATELETS 411 (H)    Recent Results (from the past 30 hour(s))   CBC WITH DIFF    Collection Time: 03/05/21  5:20 AM   Result Value    WBC 10.2    NEUTROPHIL % 50    MONOCYTE % 23    BASOPHIL % 1    BASOPHIL # <0.10      BMP Results Other Chemistries Results   Results for orders placed or performed  during the hospital encounter of 02/28/21 (from the past 30 hour(s))   BASIC METABOLIC PANEL, NON-FASTING    Collection Time: 03/05/21  5:20 AM   Result Value    SODIUM 132 (L)    POTASSIUM 5.6 (H)    CHLORIDE 101    CO2 TOTAL 22    GLUCOSE 159 (H)    BUN 52 (H)    CREATININE 3.59 (H)    Recent Results (from the past 30 hour(s))   PHOSPHORUS    Collection Time: 03/05/21  5:20 AM   Result Value    PHOSPHORUS 4.3   MAGNESIUM    Collection Time: 03/05/21  5:20 AM   Result Value    MAGNESIUM 1.5 (L)      Liver/Pancreas Enzyme Results Liver Function Results   No results found for this or any previous visit (from the past 30 hour(s)). No results found for this or any previous visit (from the past 30 hour(s)).   Cardiac Results Coags Results   No results found for this or any previous visit (from the past 30 hour(s)). Recent Results (from the past 30 hour(s))   PT/INR    Collection Time: 03/05/21  5:20 AM   Result Value    PROTHROMBIN TIME 25.3 (H)    INR 2.17 (H)        Radiology:  I reviewed imaging    PT/OT: Yes    Consults: Nephrology    Assessment/ Plan:   51 yo male with a significant PMH of HTN, HLD, DM, CAD s/p multiple PCI x10, HFpEF, PAD s/p stenting, ESRD (on dialysis), COPD, BKA, osteomyelitis, and substance use disorder who presented after missing dialysis with hyperkalemia, hypotension, and bradycardia. He received atropine and epinephrine drip while in ICU, and then underwent emergent dialysis with resolution of hyperkalemia and bradycardia.     Acute on chronic hypoxemic respiratory failure secondary to volume overload from missed HD  HFpEF, possible decompensated HF  H/o COPD Suspect PNA  - BNP 1114 on admission  - Currently on baseline 4L NC  - Continue to diurese.  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  Non-STEMI Most probably type 2 demand ischemia  HTN  HLD  PAD s/p stenting  - Troponinpeaked at 105 and has downtrended  - Continue  ASA 81 mg daily  - Per patient he's not on brilinta anymore, and last cardiac stent was >1 yr ago, will hold brilinta for now, to be addressed by outpatient cardiologist after discharge  - Hold lisinopril, aldactone and Norvasc in setting of soft BP and electrolyte imbalance  - Restarted home Coreg 25 mg BID and Imdur 60 mg daily  - TTE as above    History of DVT   - INR 3.9 on admission, warfarin held at that time but was restarted on 2/20  - INR 2.17 today, continue Coumadin 4 mg     Acute encephalopathy, likely due to uremia in setting of missed dialysis, cannot r/o delirium due to sepsis and hospitalization  - CT brain with no acute process  - Delirium precautions    Normocytic normochromic anemia  - Likely anemia of chronic disease due to ESRD, possibly compounded by ongoing hematuria and iron deficiency  - Transfuse if Hb <7  - Continue iron supplementation    Sepsis, likely due to pneumonia given bilateral pulmonary infiltrates on CT chest, UA negative for UTI  - Possible component of aspiration due to AMS  - Completed ABX    UncontrolledDiabetes mellitus  - Lantus 8 units daily and SSI       DVT/PE Prophylaxis: Warfarin    Disposition Planning: Skilled Nursing Unit - awaiting INS authorization.  Patient medically ready      Allie Bossier, DO

## 2021-03-06 LAB — CBC WITH DIFF
BASOPHIL #: <0.1 10*3/uL (ref <=0.20)
BASOPHIL %: 1 %
EOSINOPHIL #: 0.29 10*3/uL (ref <=0.50)
EOSINOPHIL %: 3 %
HCT: 24.2 % — ABNORMAL LOW (ref 38.9–52.0)
HGB: 7.2 g/dL — ABNORMAL LOW (ref 13.4–17.5)
IMMATURE GRANULOCYTE #: <0.1 10*3/uL (ref <0.10)
IMMATURE GRANULOCYTE %: 1 % (ref 0–1)
LYMPHOCYTE #: 1.88 10*3/uL (ref 1.00–4.80)
LYMPHOCYTE %: 17 %
MCH: 27.2 pg (ref 26.0–32.0)
MCHC: 29.8 g/dL — ABNORMAL LOW (ref 31.0–35.5)
MCV: 91.3 fL (ref 78.0–100.0)
MONOCYTE #: 2.34 10*3/uL — ABNORMAL HIGH (ref 0.20–1.10)
MONOCYTE %: 21 %
MPV: 9.7 fL (ref 8.7–12.5)
NEUTROPHIL #: 6.47 10*3/uL (ref 1.50–7.70)
NEUTROPHIL %: 57 %
PLATELETS: 426 10*3/uL — ABNORMAL HIGH (ref 150–400)
RBC: 2.65 10*6/uL — ABNORMAL LOW (ref 4.50–6.10)
RDW-CV: 16.5 % — ABNORMAL HIGH (ref 11.5–15.5)
WBC: 11.1 10*3/uL — ABNORMAL HIGH (ref 3.7–11.0)

## 2021-03-06 LAB — PT/INR
INR: 2.73 — ABNORMAL HIGH (ref 0.80–1.20)
PROTHROMBIN TIME: 32.1 seconds — ABNORMAL HIGH (ref 9.1–13.9)

## 2021-03-06 LAB — BASIC METABOLIC PANEL
ANION GAP: 8 mmol/L (ref 4–13)
BUN/CREA RATIO: 13 (ref 6–22)
BUN: 35 mg/dL — ABNORMAL HIGH (ref 8–25)
CALCIUM: 8.8 mg/dL (ref 8.5–10.0)
CHLORIDE: 102 mmol/L (ref 96–111)
CO2 TOTAL: 24 mmol/L (ref 22–30)
CREATININE: 2.63 mg/dL — ABNORMAL HIGH (ref 0.75–1.35)
ESTIMATED GFR: 29 mL/min/BSA — ABNORMAL LOW (ref >=60)
GLUCOSE: 116 mg/dL (ref 65–125)
POTASSIUM: 5.3 mmol/L — ABNORMAL HIGH (ref 3.5–5.1)
SODIUM: 134 mmol/L — ABNORMAL LOW (ref 136–145)

## 2021-03-06 LAB — POC BLOOD GLUCOSE (RESULTS)
GLUCOSE, POC: 148 mg/dl — ABNORMAL HIGH (ref 70–105)
GLUCOSE, POC: 199 mg/dl — ABNORMAL HIGH (ref 70–105)
GLUCOSE, POC: 214 mg/dl — ABNORMAL HIGH (ref 70–105)
GLUCOSE, POC: 120 mg/dl — ABNORMAL HIGH (ref 70–105)

## 2021-03-06 LAB — MAGNESIUM: MAGNESIUM: 1.6 mg/dL — ABNORMAL LOW (ref 1.8–2.6)

## 2021-03-06 LAB — PHOSPHORUS: PHOSPHORUS: 3.6 mg/dL (ref 2.4–4.7)

## 2021-03-06 MED ORDER — PREMIXED HEMODIALYSATE
INJECTION | INTRAVENOUS | Status: AC
Start: 2021-03-08 — End: 2021-03-08

## 2021-03-06 MED ORDER — WARFARIN 2 MG TABLET
2.0000 mg | ORAL_TABLET | Freq: Every evening | ORAL | Status: DC
Start: 2021-03-06 — End: 2021-03-09
  Administered 2021-03-06 – 2021-03-08 (×3): 2 mg via ORAL
  Filled 2021-03-06 (×4): qty 1

## 2021-03-06 NOTE — Nurses Notes (Addendum)
Upon shift assessment, a pressure ulcer was noted on the outer heel. Admitting Service notified, request for wound care consult.  Interventions implemented: dressing change/wound care, elevated heels off bed/utilize heel relief function on bed or pillows and Waffle boots applied. Refer to [place] flowsheet for wound assessment and wound care provided.    Paged service to request specialty bed.

## 2021-03-06 NOTE — Care Plan (Signed)
Pt rested comfortably overnight. Pain managed with PRN Oxy. Pt weaned to RA over night with sats 94 or greater. Waiting for insurance approval for d/c to [facility]. High fall precautions maintained with sitter select in place. Call light and personal belongings within reach.     Problem: Adult Inpatient Plan of Care  Goal: Patient-Specific Goal (Individualized)  Recent Flowsheet Documentation  Taken 03/05/2021 2100 by Carloyn Manner, RN  Individualized Care Needs: pain control  Anxieties, Fears or Concerns: none  Goal: Absence of Hospital-Acquired Illness or Injury  Intervention: Identify and Manage Fall Risk  Recent Flowsheet Documentation  Taken 03/05/2021 2100 by Carloyn Manner, RN  Safety Promotion/Fall Prevention: activity supervised  Intervention: Prevent Skin Injury  Recent Flowsheet Documentation  Taken 03/05/2021 2100 by Carloyn Manner, RN  Body Position: supine, head elevated  Skin Protection:  . adhesive use limited  . transparent dressing maintained  Intervention: Prevent and Manage VTE (Venous Thromboembolism) Risk  Recent Flowsheet Documentation  Taken 03/05/2021 2100 by Carloyn Manner, RN  VTE Prevention/Management:  . ambulation promoted  . anticoagulant therapy maintained  Intervention: Prevent Infection  Recent Flowsheet Documentation  Taken 03/05/2021 2100 by Carloyn Manner, RN  Infection Prevention:  . barrier precautions utilized  . glycemic control managed  . rest/sleep promoted  . single patient room provided  . personal protective equipment utilized  Goal: Optimal Comfort and Wellbeing  Intervention: Provide Person-Centered Care  Recent Flowsheet Documentation  Taken 03/05/2021 2100 by Carloyn Manner, RN  Trust Relationship/Rapport:  . care explained  . questions answered     Problem: Skin Injury Risk Increased  Goal: Skin Health and Integrity  Intervention: Optimize Skin Protection  Recent Flowsheet Documentation  Taken 03/05/2021 2100 by Carloyn Manner, RN  Pressure Reduction Techniques:  frequent weight shift encouraged  Pressure Reduction Devices: (M,H,VH) Use Repositioning Devices or Pillows  Skin Protection:  . adhesive use limited  . transparent dressing maintained  Activity Management:  . activity adjusted per tolerance  . activity clustered for rest periods  Head of Bed [facility]) Positioning: HOB at 20-30 degrees     Problem: Hemodialysis  Goal: Absence of Infection Signs and Symptoms  Intervention: Prevent or Manage Infection  Recent Flowsheet Documentation  Taken 03/05/2021 2100 by Carloyn Manner, RN  Fever Reduction/Comfort Measures:  . lightweight bedding  . lightweight clothing  Infection Prevention:  . barrier precautions utilized  . glycemic control managed  . rest/sleep promoted  . single patient room provided  . personal protective equipment utilized

## 2021-03-06 NOTE — Progress Notes (Signed)
[facility]  Medicine Progress Note    Max [name].  Date of service: 03/06/2021  Date of Admission:  02/28/2021    Hospital Day:  LOS: 6 days      Subjective: No issues overnight.  Denies any chest pain or palpitations.  No shortness of breath.  Eating well.  Still has LLE heaviness.       Vital Signs:  Temp  Avg: 36.8 C (98.3 F)  Min: 36.7 C (98.1 F)  Max: 37 C (98.6 F)    Pulse  Avg: 82.2  Min: 76  Max: 86 BP  Min: 113/71  Max: 120/65   Resp  Avg: 16.4  Min: 15  Max: 17 SpO2  Avg: 94.8 %  Min: 94 %  Max: 96 %          Input/Output    Intake/Output Summary (Last 24 hours) at 03/06/2021 0747  Last data filed at 03/06/2021 0700  Gross per 24 hour   Intake 1600 ml   Output 3700 ml   Net -2100 ml    I/O last shift:  02/25 0700 - 02/25 1859  In: 200 [P.O.:200]  Out: -    [Held by provider] amLODIPine (NORVASC) tablet, 10 mg, Oral, Daily  aspirin chewable tablet 81 mg, 81 mg, Oral, Daily  atorvastatin (LIPITOR) tablet, 80 mg, Oral, QPM  carvedilol (COREG) tablet, 25 mg, Oral, 2x/day-Food  [Held by provider] cyclobenzaprine (FLEXERIL) tablet, 5 mg, Oral, Q8H PRN  D5W 250 mL flush bag, , Intravenous, Q15 Min PRN  DULoxetine (CYMBALTA) delayed release capsule, 60 mg, Oral, Daily  ezetimibe (ZETIA) tablet, 10 mg, Oral, QPM  ferrous sulfate 324 mg (65 mg elemental IRON) tablet, 324 mg, Oral, Daily before Breakfast  gabapentin (NEURONTIN) capsule, 400 mg, Oral, 3x/day  insulin glargine-yfgn 100 units/mL injection, 8 Units, Subcutaneous, Daily  isosorbide mononitrate (IMDUR) 24 hr extended release tablet, 60 mg, Oral, Daily  lidocaine-menthol (LIDOPATCH) 3.6%-1.25% patch, 1 Patch, Transdermal, Daily  [Held by provider] lisinopril (PRINIVIL) tablet, 5 mg, Oral, Daily  NS 250 mL flush bag, , Intravenous, Q15 Min PRN  NS flush syringe, 2-6 mL, Intracatheter, Q8HRS  NS flush syringe, 2-6 mL, Intracatheter, Q1 MIN PRN  ondansetron (ZOFRAN) 2 mg/mL injection, 4 mg, Intravenous, Q8H PRN  oxyCODONE (ROXICODONE)  immediate release tablet, 5 mg, Oral, Q6H PRN  pantoprazole (PROTONIX) delayed release tablet, 40 mg, Oral, Daily before Breakfast  polyethylene glycol (MIRALAX) oral packet, 17 g, Oral, Daily  ranolazine (RANEXA) extended release tablet, 500 mg, Oral, 2x/day  sennosides-docusate sodium (SENOKOT-S) 8.6-50mg  per tablet, 1 Tablet, Oral, 2x/day  sevelamer carbonate (RENVELA) tablet, 800 mg, Oral, 3x/day-Meals  sodium zirconium cyclosilicate (LOKELMA) powder, 10 g, Oral, Daily  SSIP insulin lispro 100 units/mL injection, 0-12 Units, Subcutaneous, Q4H PRN  torsemide (DEMADEX) tablet, 20 mg, Oral, 2x/day  warfarin (COUMADIN) tablet, 2 mg, Oral, NIGHTLY  Warfarin - Pharmacist to Dose per Protocol, , Does not apply, Daily PRN    Physical Exam:  General Appearance: Chronically ill-appearing, and vital signs reviewed  Eyes: Conjunctiva clear, pupils equal and round, sclera non-icteric, EOMI  Lungs: Clear to auscultation bilaterally, no wheezes, breathing comfortably on NC  Heart: S1S2, RRR  Abdomen: Soft, non-tender, non-distended, normoactive bowel sounds  Extremities/MSK: 2+ edema, right BKA, moves all 4 extremities  Integumentary:  Skin warm and dry, no rashes, pale  Neurologic: Grossly normal, no focal neurologic deficits      Labs:  I reviewed labs  CBC Results Differential Results   Recent  Results (from the past 30 hour(s))   CBC WITH DIFF    Collection Time: 03/06/21  4:39 AM   Result Value    WBC 11.1 (H)    HGB 7.2 (L)    HCT 24.2 (L)    PLATELETS 426 (H)    Recent Results (from the past 30 hour(s))   CBC WITH DIFF    Collection Time: 03/06/21  4:39 AM   Result Value    WBC 11.1 (H)    NEUTROPHIL % 57    MONOCYTE % 21    BASOPHIL % 1    BASOPHIL # <0.10      BMP Results Other Chemistries Results   Results for orders placed or performed during the hospital encounter of 02/28/21 (from the past 30 hour(s))   BASIC METABOLIC PANEL, NON-FASTING    Collection Time: 03/06/21  4:38 AM   Result Value    SODIUM 134 (L)     POTASSIUM 5.3 (H)    CHLORIDE 102    CO2 TOTAL 24    GLUCOSE 116    BUN 35 (H)    CREATININE 2.63 (H)    Recent Results (from the past 30 hour(s))   PHOSPHORUS    Collection Time: 03/06/21  4:38 AM   Result Value    PHOSPHORUS 3.6   MAGNESIUM    Collection Time: 03/06/21  4:38 AM   Result Value    MAGNESIUM 1.6 (L)      Liver/Pancreas Enzyme Results Liver Function Results   No results found for this or any previous visit (from the past 30 hour(s)). No results found for this or any previous visit (from the past 30 hour(s)).   Cardiac Results Coags Results   No results found for this or any previous visit (from the past 30 hour(s)). Recent Results (from the past 30 hour(s))   PT/INR    Collection Time: 03/06/21  4:38 AM   Result Value    PROTHROMBIN TIME 32.1 (H)    INR 2.73 (H)        Radiology:  I reviewed imaging    PT/OT: Yes    Consults: Nephrology    Assessment/ Plan:   51 yo male with a significant PMH of HTN, HLD, DM, CAD s/p multiple PCI x10, HFpEF, PAD s/p stenting, ESRD (on dialysis), COPD, BKA, osteomyelitis, and substance use disorder who presented after missing dialysis with hyperkalemia, hypotension, and bradycardia. He received atropine and epinephrine drip while in ICU, and then underwent emergent dialysis with resolution of hyperkalemia and bradycardia.     Acute on chronic hypoxemic respiratory failure secondary to volume overload from missed HD  HFpEF, possible decompensated HF  H/o COPD Suspect PNA  - BNP 1114 on admission  - Currently on baseline 4L NC  - Continue to diurese.  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  Non-STEMI Most probably type 2 demand  ischemia  HTN  HLD  PAD s/p stenting  - Troponinpeaked at 105 and has downtrended  - Continue ASA 81 mg daily  - Per patient he's not on brilinta anymore, and last cardiac stent was >1  yr ago, will hold brilinta for now, to be addressed by outpatient cardiologist after discharge  - Hold lisinopril, aldactone and Norvasc in setting of soft BP and electrolyte imbalance  - Restarted home Coreg 25 mg BID and Imdur 60 mg daily  - TTE as above    History of DVT   - INR 3.9 on admission, warfarin held at that time but was restarted on 2/20  - INR 2.73 today  - Coumadin 2 mg     Acute encephalopathy, likely due to uremia in setting of missed dialysis, cannot r/o delirium due to sepsis and hospitalization  - CT brain with no acute process  - Delirium precautions    Normocytic normochromic anemia  - Likely anemia of chronic disease due to ESRD, possibly compounded by ongoing hematuria and iron deficiency  - Transfuse if Hb <7  - Continue iron supplementation    Sepsis, likely due to pneumonia given bilateral pulmonary infiltrates on CT chest, UA negative for UTI  - Possible component of aspiration due to AMS  - Completed ABX    UncontrolledDiabetes mellitus  - Lantus 8 units daily and SSI       DVT/PE Prophylaxis: Warfarin    Disposition Planning: Skilled Nursing Unit - awaiting INS authorization.  Patient medically ready    On 03/06/2021 I spent a total visit time of 35 minutes. Time included review of tests and ordering tests, obtaining/reviewing history, examining the patient, communicating with consultants, documenting clinical information and counseling the patient and/or family regarding the diagnosis and management plan and coordination of care involved services directly related to patient care.    FOLLOW UP NOTE LEVEL 2 (TOTAL TIME 35-50 MINUTES) (96045)    Allie Bossier, DO

## 2021-03-06 NOTE — Care Plan (Signed)
Pain controlled with scheduled medications. No acute events through out shift. Vitals and assessment per flow sheet. High fall precautions maintained, sitter select in place and audible. Patient verbalized understanding to call out for assistance. Call bell and personal belongings within reach.    Problem: Adult Inpatient Plan of Care  Goal: Patient-Specific Goal (Individualized)  Outcome: Ongoing (see interventions/notes)  Flowsheets (Taken 03/06/2021 0800)  Individualized Care Needs: Pain control  Anxieties, Fears or Concerns: Pain control  Goal: Absence of Hospital-Acquired Illness or Injury  Outcome: Ongoing (see interventions/notes)  Intervention: Identify and Manage Fall Risk  Recent Flowsheet Documentation  Taken 03/06/2021 0800 by Lavone Orn, RN  Safety Promotion/Fall Prevention:   activity supervised   fall prevention program maintained   motion sensor pad activated   nonskid shoes/slippers when out of bed   safety round/check completed  Intervention: Prevent Skin Injury  Recent Flowsheet Documentation  Taken 03/06/2021 1600 by Lavone Orn, RN  Body Position: turned q 2 hours  Taken 03/06/2021 1400 by Lavone Orn, RN  Body Position: turned q 2 hours  Taken 03/06/2021 1200 by Lavone Orn, RN  Body Position:   turned q 2 hours   weight shift assistance provided  Taken 03/06/2021 1000 by Lavone Orn, RN  Body Position: turned q 2 hours  Taken 03/06/2021 0800 by Lavone Orn, RN  Body Position: side lying, left  Skin Protection:   adhesive use limited   incontinence pads utilized   transparent dressing maintained  Intervention: Prevent and Manage VTE (Venous Thromboembolism) Risk  Recent Flowsheet Documentation  Taken 03/06/2021 0800 by Lavone Orn, RN  VTE Prevention/Management:   ambulation promoted   anticoagulant therapy maintained   dorsiflexion/plantar flexion performed  Intervention: Prevent Infection  Recent Flowsheet Documentation  Taken 03/06/2021 0800 by Lavone Orn, RN  Infection  Prevention:   barrier precautions utilized   single patient room provided   promote handwashing   personal protective equipment utilized  Goal: Optimal Comfort and Wellbeing  Outcome: Ongoing (see interventions/notes)  Intervention: Provide Person-Centered Care  Recent Flowsheet Documentation  Taken 03/06/2021 0800 by Lavone Orn, RN  Trust Relationship/Rapport:   care explained   emotional support provided   empathic listening provided     Problem: Skin Injury Risk Increased  Goal: Skin Health and Integrity  Outcome: Ongoing (see interventions/notes)  Intervention: Optimize Skin Protection  Recent Flowsheet Documentation  Taken 03/06/2021 0800 by Lavone Orn, RN  Pressure Reduction Techniques:   (L,M,H,VH) Manage Moisture, Nutrition & Shear   (H,VH) Increase Frequency of Turning   (H,VH) Supplemented with Small Shifts   frequent weight shift encouraged   heels elevated off bed   pressure points protected   (L,M,H,VH)Frequent Turning  Pressure Reduction Devices:   (M,H,VH) Use Repositioning Devices or Pillows   heels elevated off bed   heel-protection device  Skin Protection:   adhesive use limited   incontinence pads utilized   transparent dressing maintained  Activity Management:   activity adjusted per tolerance   activity clustered for rest periods   activity encouraged

## 2021-03-06 NOTE — Anticoag (Signed)
[place] [facility] [place]    Pharmacist Consult for Warfarin Dosing- Progress Note      Max [name]. is an 51 y.o. patient on warfarin for VTE to target a goal INR of 2 - 3. Warfarin was a continuation from prior to admission    Prior to Admission Regimen: Warfarin 4 mg daily  Pertinent details on prior to admission dosing schedule (missed/held doses, etc.): INR supratherapeutic on admission (3.9)    Most Recent Inpatient Labs  Lab Results   Component Value Date    HGB 7.2 (L) 03/06/2021    HCT 24.2 (L) 03/06/2021    ALBUMIN 2.0 (L) 02/28/2021    RBC 2.65 (L) 03/06/2021    PROTHROMTME 32.1 (H) 03/06/2021    APTT 34.6 08/26/2019    TOTBILIRUBIN 0.6 02/28/2021    AST 37 02/28/2021    ALT 10 02/28/2021    PLTCNT 426 (H) 03/06/2021       Current Diet Order:  MNT PROTOCOL FOR DIETITIAN  DIET DIABETIC Calorie amount: CC 2000; Additional modifications/limitations: LOW PHOSPHORUS, 2 G POTASSIUM  DIETARY ORAL SUPPLEMENTS Oral Supplements with tray: Ensure Enlive-Chocolate; LUNCH/DINNER; 1 Each       Inpatient Warfarin Dosing:     Date  INR  Warfarin    Dose   Bridging    Regimen              (if applicable)   Concurrent        Antiplatelet(s)  Drug Interactions  Comments   03/01/21 1.6 4 mg none      2/21 1.43 4mg  none      2/22 1.42 4mg  none      2/23 1.69 4mg  none      2/24 2.17 4mg  none      2/26 2.73 2mg  None                                              Assessment & Plan    . The INR today is Therapeutic  . Give 2mg  tonight, trending towards the high end at a quick rate  . Recheck INR in AM  . Monitor for signs/symptoms of bleeding  . Plan for outpatient management (if known) per primary team or care management:     To be determined           Please contact team pharmacist with any questions about warfarin management.

## 2021-03-07 DIAGNOSIS — I5030 Unspecified diastolic (congestive) heart failure: Secondary | ICD-10-CM

## 2021-03-07 DIAGNOSIS — Z79899 Other long term (current) drug therapy: Secondary | ICD-10-CM

## 2021-03-07 DIAGNOSIS — E8770 Fluid overload, unspecified: Secondary | ICD-10-CM

## 2021-03-07 LAB — POC BLOOD GLUCOSE (RESULTS)
GLUCOSE, POC: 196 mg/dl — ABNORMAL HIGH (ref 70–105)
GLUCOSE, POC: 196 mg/dl — ABNORMAL HIGH (ref 70–105)
GLUCOSE, POC: 177 mg/dl — ABNORMAL HIGH (ref 70–105)
GLUCOSE, POC: 196 mg/dl — ABNORMAL HIGH (ref 70–105)

## 2021-03-07 LAB — BASIC METABOLIC PANEL
ANION GAP: 8 mmol/L (ref 4–13)
BUN/CREA RATIO: 14 (ref 6–22)
BUN: 48 mg/dL — ABNORMAL HIGH (ref 8–25)
CALCIUM: 8.8 mg/dL (ref 8.5–10.0)
CHLORIDE: 100 mmol/L (ref 96–111)
CO2 TOTAL: 22 mmol/L (ref 22–30)
CREATININE: 3.38 mg/dL — ABNORMAL HIGH (ref 0.75–1.35)
ESTIMATED GFR: 22 mL/min/BSA — ABNORMAL LOW (ref >=60)
GLUCOSE: 148 mg/dL — ABNORMAL HIGH (ref 65–125)
POTASSIUM: 5.7 mmol/L — ABNORMAL HIGH (ref 3.5–5.1)
SODIUM: 130 mmol/L — ABNORMAL LOW (ref 136–145)

## 2021-03-07 LAB — CBC WITH DIFF
BASOPHIL #: <0.1 10*3/uL (ref <=0.20)
BASOPHIL %: 1 %
EOSINOPHIL #: 0.25 10*3/uL (ref <=0.50)
EOSINOPHIL %: 2 %
HCT: 23.3 % — ABNORMAL LOW (ref 38.9–52.0)
HGB: 7.1 g/dL — ABNORMAL LOW (ref 13.4–17.5)
IMMATURE GRANULOCYTE #: <0.1 10*3/uL (ref <0.10)
IMMATURE GRANULOCYTE %: 1 % (ref 0–1)
LYMPHOCYTE #: 2.08 10*3/uL (ref 1.00–4.80)
LYMPHOCYTE %: 17 %
MCH: 27.7 pg (ref 26.0–32.0)
MCHC: 30.5 g/dL — ABNORMAL LOW (ref 31.0–35.5)
MCV: 91 fL (ref 78.0–100.0)
MONOCYTE #: 2.3 10*3/uL — ABNORMAL HIGH (ref 0.20–1.10)
MONOCYTE %: 19 %
MPV: 10 fL (ref 8.7–12.5)
NEUTROPHIL #: 7.6 10*3/uL (ref 1.50–7.70)
NEUTROPHIL %: 60 %
PLATELETS: 464 10*3/uL — ABNORMAL HIGH (ref 150–400)
RBC: 2.56 10*6/uL — ABNORMAL LOW (ref 4.50–6.10)
RDW-CV: 16.6 % — ABNORMAL HIGH (ref 11.5–15.5)
WBC: 12.4 10*3/uL — ABNORMAL HIGH (ref 3.7–11.0)

## 2021-03-07 LAB — PT/INR
INR: 3.14 — ABNORMAL HIGH (ref 0.80–1.20)
PROTHROMBIN TIME: 37 seconds — ABNORMAL HIGH (ref 9.1–13.9)

## 2021-03-07 LAB — TROPONIN-I
TROPONIN I: 22 ng/L (ref 0–30)
TROPONIN I: 29 ng/L (ref 0–30)

## 2021-03-07 LAB — MAGNESIUM: MAGNESIUM: 1.6 mg/dL — ABNORMAL LOW (ref 1.8–2.6)

## 2021-03-07 LAB — PHOSPHORUS: PHOSPHORUS: 4.1 mg/dL (ref 2.4–4.7)

## 2021-03-07 MED ORDER — BACITRACIN 500 UNIT/GRAM TOPICAL PACKET
1.0000 | PACK | Freq: Two times a day (BID) | CUTANEOUS | Status: DC | PRN
Start: 2021-03-07 — End: 2021-03-09
  Administered 2021-03-09: 0 via TOPICAL
  Filled 2021-03-07: qty 1

## 2021-03-07 MED ORDER — SODIUM ZIRCONIUM CYCLOSILICATE 10 GRAM ORAL POWDER PACKET
10.0000 g | Freq: Once | ORAL | Status: AC
Start: 2021-03-07 — End: 2021-03-07
  Administered 2021-03-07: 10 g via ORAL
  Filled 2021-03-07: qty 1

## 2021-03-07 NOTE — Care Management Notes (Addendum)
[facility]  Care Management Note    Patient Name: Carolos Fecher.  Date of Birth: November 09, 1970  Sex: male  Date/Time of Admission: 02/28/2021  2:08 AM  Room/Bed: 08/A  Payor: HUMANA MEDICARE / Plan: HUMANA MEDICARE / Product Type: MEDICARE [facility] /    LOS: 7 days   Primary Care Providers:  Azzie Roup, MD, MD (General)    Admitting Diagnosis:  Hyperkalemia [E87.5]    Assessment:   MSW received task to follow pt for weekend d/c. Also received phone call to check status of d/c. MSW spoke with Dalene Seltzer at [facility], [name] is still pending at this time. Unable to return without approved auth. Pt will need EMS transport at d/c, no available EMS if auth were to be approved today. Will follow.     Discharge Plan:  SNF Return (Medicare certified) (code 3)    The patient will continue to be evaluated for developing discharge needs.     Case Manager: Veronda Prude, [place]  Phone: [phone]

## 2021-03-07 NOTE — Consults (Signed)
NEPHROLOGY FOLLOW-UP NOTE     PATIENT NAME: Max [name].  CHART NUMBER: [id number]  DATE OF BIRTH: 03-10-1970  DATE OF SERVICE: 03/07/2021     Laboratory, hemodynamic and radiological data reviewed this [name].   Medication list reviewed as well.  I did not examine the patient today.     K 5.7 this AM. Has been on Lokelma 10 g daily and dialyzing w/ 2K bath. Lisinopril on hold.   Still makes urine- on torsemide 20 mg bid.   Please administer additional dose of Lokelma today until HD tom AM.      Thank you for the consult.  Please call with any questions.    Raina Mina, MD 03/07/2021, 08:59  Nephrology Fellow, PGY-V

## 2021-03-07 NOTE — Pharmacy (Signed)
[place] / [place]    Pharmacist Consult for Warfarin Dosing- Progress Note      Max Plata. is an 51 y.o. patient on warfarin for VTE to target a goal INR of 2 - 3. Warfarin was a continuation from prior to admission    Prior to Admission Regimen: Warfarin 4 mg daily  Pertinent details on prior to admission dosing schedule (missed/held doses, etc.): INR supratherapeutic on admission (3.9)    Most Recent Inpatient Labs  Lab Results   Component Value Date    HGB 7.1 (L) 03/07/2021    HCT 23.3 (L) 03/07/2021    ALBUMIN 2.0 (L) 02/28/2021    RBC 2.56 (L) 03/07/2021    PROTHROMTME 37.0 (H) 03/07/2021    APTT 34.6 08/26/2019    TOTBILIRUBIN 0.6 02/28/2021    AST 37 02/28/2021    ALT 10 02/28/2021    PLTCNT 464 (H) 03/07/2021       Current Diet Order:  MNT PROTOCOL FOR DIETITIAN  DIET DIABETIC Calorie amount: CC 2000; Additional modifications/limitations: LOW PHOSPHORUS, 2 G POTASSIUM  DIETARY ORAL SUPPLEMENTS Oral Supplements with tray: Ensure Enlive-Chocolate; LUNCH/DINNER; 1 Each       Inpatient Warfarin Dosing:     Date  INR  Warfarin    Dose   Bridging    Regimen              (if applicable)   Concurrent        Antiplatelet(s)  Drug Interactions  Comments   03/01/21 1.6 4 mg none      2/21 1.43 4mg  none      2/22 1.42 4mg  none      2/23 1.69 4mg  none      2/24 2.17 4mg  none      2/25 2.73 2mg  None      02/26 3.14 2mg  None                                     Assessment & Plan    . The INR today is slightly supratherapeutic  . Recommend continuing with the reduced dose of 2mg  as patient had a significant drop in INR when warfarin was previously held  . Recheck INR in AM  . Monitor for signs/symptoms of bleeding  . Plan for outpatient management (if known) per primary team or care management:     To be determined           Please contact team pharmacist with any questions about warfarin management.

## 2021-03-07 NOTE — Progress Notes (Signed)
[facility]  Medicine Progress Note    Max [name].  Date of service: 03/07/2021  Date of Admission:  02/28/2021    Hospital Day:  LOS: 7 days      Subjective: Patient worried about developing wound on the lateral aspect of his left heel.  Denies any chest pain or shortness of breath.  Still waiting on INS authorization for placement.       Vital Signs:  Temp  Avg: 36.4 C (97.5 F)  Min: 35.9 C (96.6 F)  Max: 36.6 C (97.9 F)    Pulse  Avg: 72.7  Min: 70  Max: 75 BP  Min: 101/89  Max: 131/73   Resp  Avg: 16.3  Min: 15  Max: 19 SpO2  Avg: 98.2 %  Min: 96 %  Max: 100 %          Input/Output    Intake/Output Summary (Last 24 hours) at 03/07/2021 0836  Last data filed at 03/07/2021 0600  Gross per 24 hour   Intake 1160 ml   Output 250 ml   Net 910 ml    I/O last shift:  No intake/output data recorded.   [Held by provider] amLODIPine (NORVASC) tablet, 10 mg, Oral, Daily  aspirin chewable tablet 81 mg, 81 mg, Oral, Daily  atorvastatin (LIPITOR) tablet, 80 mg, Oral, QPM  carvedilol (COREG) tablet, 25 mg, Oral, 2x/day-Food  [Held by provider] cyclobenzaprine (FLEXERIL) tablet, 5 mg, Oral, Q8H PRN  D5W 250 mL flush bag, , Intravenous, Q15 Min PRN  DULoxetine (CYMBALTA) delayed release capsule, 60 mg, Oral, Daily  ezetimibe (ZETIA) tablet, 10 mg, Oral, QPM  ferrous sulfate 324 mg (65 mg elemental IRON) tablet, 324 mg, Oral, Daily before Breakfast  gabapentin (NEURONTIN) capsule, 400 mg, Oral, 3x/day  insulin glargine-yfgn 100 units/mL injection, 8 Units, Subcutaneous, Daily  isosorbide mononitrate (IMDUR) 24 hr extended release tablet, 60 mg, Oral, Daily  lidocaine-menthol (LIDOPATCH) 3.6%-1.25% patch, 1 Patch, Transdermal, Daily  [Held by provider] lisinopril (PRINIVIL) tablet, 5 mg, Oral, Daily  NS 250 mL flush bag, , Intravenous, Q15 Min PRN  NS flush syringe, 2-6 mL, Intracatheter, Q8HRS  NS flush syringe, 2-6 mL, Intracatheter, Q1 MIN PRN  ondansetron (ZOFRAN) 2 mg/mL injection, 4 mg, Intravenous, Q8H  PRN  oxyCODONE (ROXICODONE) immediate release tablet, 5 mg, Oral, Q6H PRN  pantoprazole (PROTONIX) delayed release tablet, 40 mg, Oral, Daily before Breakfast  polyethylene glycol (MIRALAX) oral packet, 17 g, Oral, Daily  [START ON 03/08/2021] premixed hemodialysate (NATURALYTE) 3.43 L with potassium chloride 2 mEq/L, calcium chloride 2.5 mEq/L, , Hemodialysis, Give in Dialysis  ranolazine (RANEXA) extended release tablet, 500 mg, Oral, 2x/day  sennosides-docusate sodium (SENOKOT-S) 8.6-50mg  per tablet, 1 Tablet, Oral, 2x/day  sevelamer carbonate (RENVELA) tablet, 800 mg, Oral, 3x/day-Meals  sodium zirconium cyclosilicate (LOKELMA) powder, 10 g, Oral, Daily  SSIP insulin lispro 100 units/mL injection, 0-12 Units, Subcutaneous, Q4H PRN  torsemide (DEMADEX) tablet, 20 mg, Oral, 2x/day  warfarin (COUMADIN) tablet, 2 mg, Oral, NIGHTLY  Warfarin - Pharmacist to Dose per Protocol, , Does not apply, Daily PRN    Physical Exam:  General Appearance: Chronically ill-appearing, and vital signs reviewed  Eyes: Conjunctiva clear, pupils equal and round, sclera non-icteric, EOMI  Lungs: Clear to auscultation bilaterally, breathing comfortably on NC  Heart: S1S2, RRR  Abdomen: Soft, non-tender, non-distended, normoactive bowel sounds  Extremities/MSK: 2+ edema, right BKA, moves all 4 extremities  Integumentary:  Skin warm and dry, no rashes, pale  Neurologic: Grossly normal, no focal neurologic  deficits      Labs:  I reviewed labs  CBC Results Differential Results   Recent Results (from the past 30 hour(s))   CBC WITH DIFF    Collection Time: 03/07/21  3:24 AM   Result Value    WBC 12.4 (H)    HGB 7.1 (L)    HCT 23.3 (L)    PLATELETS 464 (H)    Recent Results (from the past 30 hour(s))   CBC WITH DIFF    Collection Time: 03/07/21  3:24 AM   Result Value    WBC 12.4 (H)    NEUTROPHIL % 60    MONOCYTE % 19    BASOPHIL % 1    BASOPHIL # <0.10      BMP Results Other Chemistries Results   Results for orders placed or performed during  the hospital encounter of 02/28/21 (from the past 30 hour(s))   BASIC METABOLIC PANEL, NON-FASTING    Collection Time: 03/07/21  3:24 AM   Result Value    SODIUM 130 (L)    POTASSIUM 5.7 (H)    CHLORIDE 100    CO2 TOTAL 22    GLUCOSE 148 (H)    BUN 48 (H)    CREATININE 3.38 (H)    Recent Results (from the past 30 hour(s))   PHOSPHORUS    Collection Time: 03/07/21  3:24 AM   Result Value    PHOSPHORUS 4.1   MAGNESIUM    Collection Time: 03/07/21  3:24 AM   Result Value    MAGNESIUM 1.6 (L)      Liver/Pancreas Enzyme Results Liver Function Results   No results found for this or any previous visit (from the past 30 hour(s)). No results found for this or any previous visit (from the past 30 hour(s)).   Cardiac Results Coags Results   No results found for this or any previous visit (from the past 30 hour(s)). Recent Results (from the past 30 hour(s))   PT/INR    Collection Time: 03/07/21  3:24 AM   Result Value    PROTHROMBIN TIME 37.0 (H)    INR 3.14 (H)        Radiology:  I reviewed imaging    PT/OT: Yes    Consults: Nephrology    Assessment/ Plan:   51 yo male with a significant PMH of HTN, HLD, DM, CAD s/p multiple PCI x10, HFpEF, PAD s/p stenting, ESRD (on dialysis), COPD, BKA, osteomyelitis, and substance use disorder who presented after missing dialysis with hyperkalemia, hypotension, and bradycardia. He received atropine and epinephrine drip while in ICU, and then underwent emergent dialysis with resolution of hyperkalemia and bradycardia.     Acute on chronic hypoxemic respiratory failure secondary to volume overload from missed HD  HFpEF, possible decompensated HF  H/o COPD Suspect PNA  - BNP 1114 on admission  - Currently on 1L NC  - Continue to diurese.  On torsemide 20 mg BID  - TTE showing LVEF 55%  - Completed Vanc, cefepime, and doxycycline for PNA    ESRD on HD  Hyperkalemia   - Nephro following, on HD M/W/F    - On Lokelma. 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  Non-STEMI Most probably type 2 demand ischemia  HTN  HLD  PAD s/p stenting  -  Troponinpeaked at 105 and has downtrended  - Continue ASA 81 mg daily  - Per patient he's not on brilinta anymore, and last cardiac stent was >1 yr ago, will hold brilinta for now, to be addressed by outpatient cardiologist after discharge  - Hold lisinopril, aldactone and Norvasc in setting of soft BP and electrolyte imbalance  - Restarted home Coreg 25 mg BID and Imdur 60 mg daily  - TTE as above    History of DVT   - INR 3.9 on admission, warfarin held at that time but was restarted on 2/20  - INR 3.14 today  - Coumadin dosed per pharmacy    Acute encephalopathy, likely due to uremia in setting of missed dialysis, cannot r/o delirium due to sepsis and hospitalization  - CT brain with no acute process  - Delirium precautions    Normocytic normochromic anemia  - Likely anemia of chronic disease due to ESRD, possibly compounded by ongoing hematuria and iron deficiency  - Transfuse if Hb <7  - Continue iron supplementation    Sepsis, likely due to pneumonia given bilateral pulmonary infiltrates on CT chest, UA negative for UTI  - Possible component of aspiration due to AMS  - Completed ABX    UncontrolledDiabetes mellitus  - Lantus 8 units daily and SSI       DVT/PE Prophylaxis: Warfarin    Disposition Planning: Skilled Nursing Unit - awaiting INS authorization    On 03/07/2021 I spent a total visit time of 40 minutes. Time included review of tests and ordering tests, obtaining/reviewing history, examining the patient, communicating with consultants, documenting clinical information and counseling the patient and/or family regarding the diagnosis and management plan and coordination of care involved services directly related to patient  care.    FOLLOW UP NOTE LEVEL 2 (TOTAL TIME 35-50 MINUTES) ([id number])    Allie Bossier, DO

## 2021-03-07 NOTE — Nurses Notes (Signed)
Patient c/o SOB. O2 levels 94-95% on RA. No visible s/s of dyspnea. Attempt to put 1LNC O2 on patient. Patient refused stating he wants 3LNC. Explained to patient that O2 sat stable and starting with 1LNC would be appropriate for relief of SOB. Patient states he can't feel it coming out and if he can't feel it coming out he does not want it. Explained to patient that i was able to feel the air flow coming out of NC. Patient still refusing supplemental O2 unless placed on 3LNC. Patient remains stable and has now decided to take a nap. Patient has also refused lunch tray and appeared upset that was only given one packet of honey mustard for his chicken. Patient encouraged to eat lunch and tray left at bedside.      Gavin Pound, RN  03/07/2021, 12:48

## 2021-03-07 NOTE — Nurses Notes (Signed)
Patient request supplemental oxygen be increased to [facility]. Patient educated to supplemental oxygen use. Current room air O2Sat 96% on RA. Patient then presented c/o chest pain sharp stabbing pain to left chest radiating into left arm 8 of 10 on pain scale. VSS. Service contacted and made aware of CP complaint. Awaiting orders.     Gavin Pound, RN  03/07/2021, 15:59

## 2021-03-07 NOTE — Care Plan (Signed)
Patient with c/o CP. Troponin 22. EKG collected. VSS. Patient educated to supplemental oxygen usage with SOB and to limited chocolate milk intake in light of ESRD and DM. Education will require reinforcement. Patient in specialty bed with turn Q2h. Patient encouraged to increase activity. Patient offered shower with assistance but refused. Sitter select in use. Call light in reach.     Gavin Pound, RN  03/07/2021, 18:38       Problem: Adult Inpatient Plan of Care  Goal: Plan of Care Review  Outcome: Ongoing (see interventions/notes)  Goal: Patient-Specific Goal (Individualized)  Outcome: Ongoing (see interventions/notes)  Flowsheets (Taken 03/07/2021 1830)  Individualized Care Needs: turn Q2 hours. encourage PO intake.  Anxieties, Fears or Concerns: concerns about left heel wound leading to potential amputation and chest pain.  Patient/Family-Specific Goals (Include Timeframe): to have dialysis tomorrow.  Goal: Absence of Hospital-Acquired Illness or Injury  Outcome: Ongoing (see interventions/notes)  Intervention: Identify and Manage Fall Risk  Recent Flowsheet Documentation  Taken 03/07/2021 1541 by Gavin Pound, RN  Safety Promotion/Fall Prevention:   activity supervised   fall prevention program maintained   motion sensor pad activated   nonskid shoes/slippers when out of bed   safety round/check completed  Intervention: Prevent Skin Injury  Recent Flowsheet Documentation  Taken 03/07/2021 1600 by Gavin Pound, RN  Body Position:   semi-fowlers (30-45 degrees)   supine, head elevated   lower extremity elevated, right  Taken 03/07/2021 1541 by Gavin Pound, RN  Skin Protection:   adhesive use limited   tubing/devices free from skin contact  Taken 03/07/2021 1200 by Gavin Pound, RN  Body Position: side lying, left  Taken 03/07/2021 1000 by Gavin Pound, RN  Body Position:   semi-fowlers (30-45 degrees)   supine, head elevated   lower extremity elevated, right  Intervention: Prevent and Manage VTE  (Venous Thromboembolism) Risk  Recent Flowsheet Documentation  Taken 03/07/2021 1541 by Gavin Pound, RN  VTE Prevention/Management: anticoagulant therapy maintained  Intervention: Prevent Infection  Recent Flowsheet Documentation  Taken 03/07/2021 1541 by Gavin Pound, RN  Infection Prevention: barrier precautions utilized  Goal: Optimal Comfort and Wellbeing  Outcome: Ongoing (see interventions/notes)  Intervention: Provide Person-Centered Care  Recent Flowsheet Documentation  Taken 03/07/2021 1541 by Gavin Pound, RN  Trust Relationship/Rapport:   care explained   choices provided   emotional support provided   empathic listening provided   questions answered   questions encouraged   reassurance provided   thoughts/feelings acknowledged  Goal: Rounds/Family Conference  Outcome: Ongoing (see interventions/notes)     Problem: Skin Injury Risk Increased  Goal: Skin Health and Integrity  Outcome: Ongoing (see interventions/notes)  Intervention: Optimize Skin Protection  Recent Flowsheet Documentation  Taken 03/07/2021 1541 by Gavin Pound, RN  Pressure Reduction Techniques:   (L,M,H,VH)Frequent Turning   (L,M,H,VH) Turn Schedule - Minimum every 2 hours   (L,M,H,VH) Maximal Remobilization   (L,M,H,VH) Manage Moisture, Nutrition & Shear   (M,H,VH) 30 degree Lateral Positioning   (H,VH) Increase Frequency of Turning   (H,VH) Supplemented with Small Shifts   frequent weight shift encouraged   heels elevated off bed   positioned off wounds   pressure points protected   weight shift assistance provided  Pressure Reduction Devices:   (M,H,VH) Use Repositioning Devices or Pillows   (L.M,H,VH) Heel offloading device utilized   (L.M,H,VH) Pressure redistributing mattress utilized   (L.M,H,VH) Specialty Bed utilized   (L.M,H,VH) Pressure Redistributing Chair cushion utilized   heels elevated off bed   heel-protection device  Skin Protection:   adhesive use limited   tubing/devices free from skin contact  Activity  Management:   activity adjusted per tolerance   activity clustered for rest periods   activity encouraged     Problem: Hemodialysis  Goal: Safe, Effective Therapy Delivery  Outcome: Ongoing (see interventions/notes)  Goal: Effective Tissue Perfusion  Outcome: Ongoing (see interventions/notes)  Goal: Absence of Infection Signs and Symptoms  Outcome: Ongoing (see interventions/notes)  Intervention: Prevent or Manage Infection  Recent Flowsheet Documentation  Taken 03/07/2021 1541 by Gavin Pound, RN  Fever Reduction/Comfort Measures:   lightweight bedding   lightweight clothing  Infection Prevention: barrier precautions utilized

## 2021-03-07 NOTE — Care Plan (Signed)
Pain managed with prn medications. High fall precautions maintained with sitter select in place. Call light and personal belongings within reach.    Problem: Adult Inpatient Plan of Care  Goal: Patient-Specific Goal (Individualized)  Recent Flowsheet Documentation  Taken 03/06/2021 2000 by Carloyn Manner, RN  Individualized Care Needs: turned heat down  Anxieties, Fears or Concerns: none voiced  Goal: Absence of Hospital-Acquired Illness or Injury  Intervention: Identify and Manage Fall Risk  Recent Flowsheet Documentation  Taken 03/06/2021 2000 by Carloyn Manner, RN  Safety Promotion/Fall Prevention: safety round/check completed  Intervention: Prevent Skin Injury  Recent Flowsheet Documentation  Taken 03/06/2021 2000 by Carloyn Manner, RN  Body Position: turned q 2 hours  Skin Protection:   adhesive use limited   transparent dressing maintained  Intervention: Prevent and Manage VTE (Venous Thromboembolism) Risk  Recent Flowsheet Documentation  Taken 03/06/2021 2000 by Carloyn Manner, RN  VTE Prevention/Management:   ambulation promoted   anticoagulant therapy maintained   dorsiflexion/plantar flexion performed  Goal: Optimal Comfort and Wellbeing  Intervention: Provide Person-Centered Care  Recent Flowsheet Documentation  Taken 03/06/2021 2000 by Carloyn Manner, RN  Trust Relationship/Rapport:   care explained   questions answered     Problem: Skin Injury Risk Increased  Goal: Skin Health and Integrity  Intervention: Optimize Skin Protection  Recent Flowsheet Documentation  Taken 03/06/2021 2000 by Carloyn Manner, RN  Pressure Reduction Techniques: frequent weight shift encouraged  Pressure Reduction Devices: (M,H,VH) Use Repositioning Devices or Pillows  Skin Protection:   adhesive use limited   transparent dressing maintained  Activity Management: activity adjusted per tolerance  Head of Bed (HOB) Positioning: HOB at 20-30 degrees     Problem: Hemodialysis  Goal: Absence of Infection Signs and  Symptoms  Intervention: Prevent or Manage Infection  Recent Flowsheet Documentation  Taken 03/06/2021 2000 by Carloyn Manner, RN  Fever Reduction/Comfort Measures:   lightweight bedding   lightweight clothing

## 2021-03-08 ENCOUNTER — Inpatient Hospital Stay (HOSPITAL_COMMUNITY): Payer: Medicare (Managed Care)

## 2021-03-08 DIAGNOSIS — I700 Atherosclerosis of aorta: Secondary | ICD-10-CM

## 2021-03-08 DIAGNOSIS — Z89511 Acquired absence of right leg below knee: Secondary | ICD-10-CM

## 2021-03-08 DIAGNOSIS — L89609 Pressure ulcer of unspecified heel, unspecified stage: Secondary | ICD-10-CM

## 2021-03-08 DIAGNOSIS — I959 Hypotension, unspecified: Secondary | ICD-10-CM

## 2021-03-08 DIAGNOSIS — L97422 Non-pressure chronic ulcer of left heel and midfoot with fat layer exposed: Secondary | ICD-10-CM

## 2021-03-08 LAB — CBC WITH DIFF
BASOPHIL #: <0.1 10*3/uL (ref <=0.20)
BASOPHIL %: 1 %
EOSINOPHIL #: 0.25 10*3/uL (ref <=0.50)
EOSINOPHIL %: 2 %
HCT: 24.8 % — ABNORMAL LOW (ref 38.9–52.0)
HGB: 7.5 g/dL — ABNORMAL LOW (ref 13.4–17.5)
IMMATURE GRANULOCYTE #: <0.1 10*3/uL (ref <0.10)
IMMATURE GRANULOCYTE %: 1 % (ref 0–1)
LYMPHOCYTE #: 2.19 10*3/uL (ref 1.00–4.80)
LYMPHOCYTE %: 17 %
MCH: 27.6 pg (ref 26.0–32.0)
MCHC: 30.2 g/dL — ABNORMAL LOW (ref 31.0–35.5)
MCV: 91.2 fL (ref 78.0–100.0)
MONOCYTE #: 2.37 10*3/uL — ABNORMAL HIGH (ref 0.20–1.10)
MONOCYTE %: 19 %
MPV: 9.9 fL (ref 8.7–12.5)
NEUTROPHIL #: 7.67 10*3/uL (ref 1.50–7.70)
NEUTROPHIL %: 60 %
PLATELETS: 454 10*3/uL — ABNORMAL HIGH (ref 150–400)
RBC: 2.72 10*6/uL — ABNORMAL LOW (ref 4.50–6.10)
RDW-CV: 16.5 % — ABNORMAL HIGH (ref 11.5–15.5)
WBC: 12.7 10*3/uL — ABNORMAL HIGH (ref 3.7–11.0)

## 2021-03-08 LAB — BASIC METABOLIC PANEL
ANION GAP: 9 mmol/L (ref 4–13)
BUN/CREA RATIO: 16 (ref 6–22)
BUN: 61 mg/dL — ABNORMAL HIGH (ref 8–25)
CALCIUM: 8.7 mg/dL (ref 8.5–10.0)
CHLORIDE: 102 mmol/L (ref 96–111)
CO2 TOTAL: 22 mmol/L (ref 22–30)
CREATININE: 3.72 mg/dL — ABNORMAL HIGH (ref 0.75–1.35)
ESTIMATED GFR: 19 mL/min/BSA — ABNORMAL LOW (ref >=60)
GLUCOSE: 127 mg/dL — ABNORMAL HIGH (ref 65–125)
POTASSIUM: 6 mmol/L (ref 3.5–5.1)
SODIUM: 133 mmol/L — ABNORMAL LOW (ref 136–145)

## 2021-03-08 LAB — POC BLOOD GLUCOSE (RESULTS)
GLUCOSE, POC: 94 mg/dl (ref 70–105)
GLUCOSE, POC: 119 mg/dl — ABNORMAL HIGH (ref 70–105)
GLUCOSE, POC: 98 mg/dl (ref 70–105)
GLUCOSE, POC: 153 mg/dl — ABNORMAL HIGH (ref 70–105)
GLUCOSE, POC: 185 mg/dl — ABNORMAL HIGH (ref 70–105)

## 2021-03-08 LAB — PT/INR
INR: 3.27 — ABNORMAL HIGH (ref 0.80–1.20)
PROTHROMBIN TIME: 38.5 seconds — ABNORMAL HIGH (ref 9.1–13.9)

## 2021-03-08 LAB — MAGNESIUM: MAGNESIUM: 1.5 mg/dL — ABNORMAL LOW (ref 1.8–2.6)

## 2021-03-08 LAB — PHOSPHORUS: PHOSPHORUS: 4.7 mg/dL (ref 2.4–4.7)

## 2021-03-08 MED ORDER — COLLAGENASE CLOSTRIDIUM HISTOLYTICUM 250 UNIT/GRAM TOPICAL OINTMENT
TOPICAL_OINTMENT | Freq: Every day | CUTANEOUS | Status: DC
Start: 2021-03-08 — End: 2021-03-09
  Filled 2021-03-08: qty 30

## 2021-03-08 MED ORDER — MAGNESIUM SULFATE 4 GRAM/100 ML (4 %) IN WATER INTRAVENOUS PIGGYBACK
4.0000 g | INJECTION | Freq: Once | INTRAVENOUS | Status: AC
Start: 2021-03-08 — End: 2021-03-08
  Administered 2021-03-08: 4 g via INTRAVENOUS
  Administered 2021-03-08: 0 g via INTRAVENOUS
  Filled 2021-03-08: qty 100

## 2021-03-08 MED ORDER — SODIUM CITRATE 4 % (3 ML) INTRA-CATHETER INJECTION SYRINGE
2.0000 | INJECTION | Status: AC
Start: 2021-03-08 — End: 2021-03-08
  Administered 2021-03-08: 2
  Filled 2021-03-08 (×3): qty 6

## 2021-03-08 NOTE — Consults (Signed)
[facility]   Section of Nephrology  ESRD Follow-up Consult Note    [name], Max. 51 y.o. male  Date of Admission:  02/28/2021  Date of Birth:  10-31-1970  Date of service: 03/08/2021      REASON FOR CONSULT:Dialysis Management     CONSULTING PHYSICIAN:MICU     ASSESSMENT/RECOMMENDATIONS:    51 y.o. male with past medical history of CAD s/p stents(recent one 3 months ago), HFrEF ,PAD s/p stenting , HTN , DM , ESRD (M/W/F)started 3 months back , Rt BKA , COPD transferred from outside facility to MICU for management of Hyperkalemia with EKG changes after he missed his HD session.    ESRD on HD MWF  Follows at [facility] and undergoes dialysis on M/W/F via Rt TCC    Electrolytes- K 6  Acid Base- Stable  Volume status- Euvolemic  CKD [place]- Ca,  phos on target.   Anemia- Stable   Antibiotics- none    Recs:  -HD today as per his schedule   -BFR 400, DFR 700 , 2K bath , UF 3.3L as tolerated for 4.3 hrs   -Not sure cause of Hyperkalemia could be 2/2 to Aldactone at home   -Will do HD session tomorrow too   - s/p Emergent HD on 2/19 early AM 2/2 Hyperkalemia with EKG changes  - Maintain MAP > 34mmHg to optimize hemodynamic status  - Avoid nephrotoxins, adjust meds to appropriate GFR  - Daily BMP, mag, phos  - Optimize nutrition   - Strict I/O      SUBJECTIVE:  Seen in HD unit   Doing well   No active complaints     Inpatient Medications:  [Held by provider] amLODIPine (NORVASC) tablet, 10 mg, Oral, Daily  aspirin chewable tablet 81 mg, 81 mg, Oral, Daily  atorvastatin (LIPITOR) tablet, 80 mg, Oral, QPM  bacitracin 500 units/gram topical ointment packet, 1 Packet, Apply Topically, 2x/day PRN  carvedilol (COREG) tablet, 25 mg, Oral, 2x/day-Food  [Held by provider] cyclobenzaprine (FLEXERIL) tablet, 5 mg, Oral, Q8H PRN  D5W 250 mL flush bag, , Intravenous, Q15 Min PRN  DULoxetine (CYMBALTA) delayed release capsule, 60 mg, Oral, Daily  ezetimibe (ZETIA) tablet, 10 mg, Oral, QPM  ferrous sulfate 324  mg (65 mg elemental IRON) tablet, 324 mg, Oral, Daily before Breakfast  gabapentin (NEURONTIN) capsule, 400 mg, Oral, 3x/day  insulin glargine-yfgn 100 units/mL injection, 8 Units, Subcutaneous, Daily  isosorbide mononitrate (IMDUR) 24 hr extended release tablet, 60 mg, Oral, Daily  lidocaine-menthol (LIDOPATCH) 3.6%-1.25% patch, 1 Patch, Transdermal, Daily  [Held by provider] lisinopril (PRINIVIL) tablet, 5 mg, Oral, Daily  magnesium sulfate 4 G in SW 100 mL premix IVPB, 4 g, Intravenous, Once  NS 250 mL flush bag, , Intravenous, Q15 Min PRN  NS flush syringe, 2-6 mL, Intracatheter, Q8HRS  NS flush syringe, 2-6 mL, Intracatheter, Q1 MIN PRN  ondansetron (ZOFRAN) 2 mg/mL injection, 4 mg, Intravenous, Q8H PRN  oxyCODONE (ROXICODONE) immediate release tablet, 5 mg, Oral, Q6H PRN  pantoprazole (PROTONIX) delayed release tablet, 40 mg, Oral, Daily before Breakfast  polyethylene glycol (MIRALAX) oral packet, 17 g, Oral, Daily  premixed hemodialysate (NATURALYTE) 3.43 L with potassium chloride 2 mEq/L, calcium chloride 2.5 mEq/L, , Hemodialysis, Give in Dialysis  ranolazine (RANEXA) extended release tablet, 500 mg, Oral, 2x/day  sennosides-docusate sodium (SENOKOT-S) 8.6-50mg  per tablet, 1 Tablet, Oral, 2x/day  sevelamer carbonate (RENVELA) tablet, 800 mg, Oral, 3x/day-Meals  sodium citrate 4% injection syringe, 2 Syringe, Intracatheter, Give in Dialysis  sodium zirconium cyclosilicate (LOKELMA) powder, 10 g, Oral, Daily  SSIP insulin lispro 100 units/mL injection, 0-12 Units, Subcutaneous, Q4H PRN  torsemide (DEMADEX) tablet, 20 mg, Oral, 2x/day  warfarin (COUMADIN) tablet, 2 mg, Oral, NIGHTLY  Warfarin - Pharmacist to Dose per Protocol, , Does not apply, Daily PRN      Objective:  Filed Vitals:    03/07/21 2300 03/08/21 0237 03/08/21 0700 03/08/21 0720   BP: 123/84 123/84 128/78 122/68   Pulse: 73 72 76 72   Resp: 16 16 16     Temp: 36.8 C (98.3 F) 36.4 C (97.6 F) 36.8 C (98.3 F)    SpO2: 94% 97% 96%      General  :on 3L NC (baseline)  Lungs: crackles+   TZG:[id number]   Access: rt TCC   Rt BKA     I&O:  02/26 0700 - 02/27 0659  In: 750 [P.O.:750]  Out: 200 [Urine:200]     Labs:  Recent Labs     03/06/21  0438 03/07/21  0324 03/08/21  0426   SODIUM 134* 130* 133*   POTASSIUM 5.3* 5.7* 6.0*   CHLORIDE 102 100 102   CO2 24 22 22    BUN 35* 48* 61*   CREATININE 2.63* 3.38* 3.72*   GFR 29* 22* 19*   ANIONGAP 8 8 9      Recent Labs     03/06/21  0438 03/07/21  0324 03/08/21  0426   CALCIUM 8.8 8.8 8.7   MAGNESIUM 1.6* 1.6* 1.5*   PHOSPHORUS 3.6 4.1 4.7     Recent Labs     03/06/21  0439 03/07/21  0324 03/08/21  0426   WBC 11.1* 12.4* 12.7*   HGB 7.2* 7.1* 7.5*   HCT 24.2* 23.3* 24.8*   PLTCNT 426* 464* 454*     No results found for this encounter    Thank you for the consult.  Will continue to follow.   Please call with any questions.    Lynwood Dawley, MD 03/08/2021  Nephrology Fellow, PGY-V   Nephrology Consult Team [facility]      I saw and examined the patient.  I reviewed the fellow's note.  I agree with the findings and plan of care as documented in the fellow's note.  Any exceptions/additions are edited/noted.    Clydia Llano, MD

## 2021-03-08 NOTE — Progress Notes (Signed)
[facility]  Medicine Progress Note    Max [name].  Date of service: 03/08/2021  Date of Admission:  02/28/2021    Hospital Day:  LOS: 8 days      Subjective: Evaluated patient in HD.  States he is hungry, no abdominal pain, N/V.  No dizziness or lightheadedness.        Vital Signs:  Temp  Avg: 36.8 C (98.2 F)  Min: 36.4 C (97.6 F)  Max: 36.9 C (98.4 F)    Pulse  Avg: 76.2  Min: 72  Max: 80 BP  Min: 119/76  Max: 128/78   Resp  Avg: 16.7  Min: 16  Max: 18 SpO2  Avg: 95.4 %  Min: 94 %  Max: 97 %          Input/Output    Intake/Output Summary (Last 24 hours) at 03/08/2021 0759  Last data filed at 03/08/2021 0552  Gross per 24 hour   Intake 750 ml   Output 200 ml   Net 550 ml    I/O last shift:  No intake/output data recorded.   [Held by provider] amLODIPine (NORVASC) tablet, 10 mg, Oral, Daily  aspirin chewable tablet 81 mg, 81 mg, Oral, Daily  atorvastatin (LIPITOR) tablet, 80 mg, Oral, QPM  bacitracin 500 units/gram topical ointment packet, 1 Packet, Apply Topically, 2x/day PRN  carvedilol (COREG) tablet, 25 mg, Oral, 2x/day-Food  [Held by provider] cyclobenzaprine (FLEXERIL) tablet, 5 mg, Oral, Q8H PRN  D5W 250 mL flush bag, , Intravenous, Q15 Min PRN  DULoxetine (CYMBALTA) delayed release capsule, 60 mg, Oral, Daily  ezetimibe (ZETIA) tablet, 10 mg, Oral, QPM  ferrous sulfate 324 mg (65 mg elemental IRON) tablet, 324 mg, Oral, Daily before Breakfast  gabapentin (NEURONTIN) capsule, 400 mg, Oral, 3x/day  insulin glargine-yfgn 100 units/mL injection, 8 Units, Subcutaneous, Daily  isosorbide mononitrate (IMDUR) 24 hr extended release tablet, 60 mg, Oral, Daily  lidocaine-menthol (LIDOPATCH) 3.6%-1.25% patch, 1 Patch, Transdermal, Daily  [Held by provider] lisinopril (PRINIVIL) tablet, 5 mg, Oral, Daily  NS 250 mL flush bag, , Intravenous, Q15 Min PRN  NS flush syringe, 2-6 mL, Intracatheter, Q8HRS  NS flush syringe, 2-6 mL, Intracatheter, Q1 MIN PRN  ondansetron (ZOFRAN) 2 mg/mL injection, 4 mg,  Intravenous, Q8H PRN  oxyCODONE (ROXICODONE) immediate release tablet, 5 mg, Oral, Q6H PRN  pantoprazole (PROTONIX) delayed release tablet, 40 mg, Oral, Daily before Breakfast  polyethylene glycol (MIRALAX) oral packet, 17 g, Oral, Daily  premixed hemodialysate (NATURALYTE) 3.43 L with potassium chloride 2 mEq/L, calcium chloride 2.5 mEq/L, , Hemodialysis, Give in Dialysis  ranolazine (RANEXA) extended release tablet, 500 mg, Oral, 2x/day  sennosides-docusate sodium (SENOKOT-S) 8.6-50mg  per tablet, 1 Tablet, Oral, 2x/day  sevelamer carbonate (RENVELA) tablet, 800 mg, Oral, 3x/day-Meals  sodium citrate 4% injection syringe, 2 Syringe, Intracatheter, Give in Dialysis  sodium zirconium cyclosilicate (LOKELMA) powder, 10 g, Oral, Daily  SSIP insulin lispro 100 units/mL injection, 0-12 Units, Subcutaneous, Q4H PRN  torsemide (DEMADEX) tablet, 20 mg, Oral, 2x/day  warfarin (COUMADIN) tablet, 2 mg, Oral, NIGHTLY  Warfarin - Pharmacist to Dose per Protocol, , Does not apply, Daily PRN    Physical Exam:  General Appearance: Chronically ill-appearing, laying comfortably, and vital signs reviewed  Eyes: Conjunctiva clear, pupils equal and round, sclera non-icteric, EOMI  Lungs: Clear to auscultation bilaterally, breathing comfortably on RA  Heart: S1S2, RRR  Abdomen: Soft, non-tender, non-distended, normoactive bowel sounds  Extremities/MSK: 2+ edema, right BKA, moves all 4 extremities  Integumentary:  Skin  warm and dry, no rashes, pale  Neurologic: Grossly normal, no focal neurologic deficits      Labs:  I reviewed labs  CBC Results Differential Results   Recent Results (from the past 30 hour(s))   CBC WITH DIFF    Collection Time: 03/08/21  4:26 AM   Result Value    WBC 12.7 (H)    HGB 7.5 (L)    HCT 24.8 (L)    PLATELETS 454 (H)    Recent Results (from the past 30 hour(s))   CBC WITH DIFF    Collection Time: 03/08/21  4:26 AM   Result Value    WBC 12.7 (H)    NEUTROPHIL % 60    MONOCYTE % 19    BASOPHIL % 1    BASOPHIL #  <0.10      BMP Results Other Chemistries Results   Results for orders placed or performed during the hospital encounter of 02/28/21 (from the past 30 hour(s))   BASIC METABOLIC PANEL, NON-FASTING    Collection Time: 03/08/21  4:26 AM   Result Value    SODIUM 133 (L)    POTASSIUM 6.0 (HH)    CHLORIDE 102    CO2 TOTAL 22    GLUCOSE 127 (H)    BUN 61 (H)    CREATININE 3.72 (H)    Recent Results (from the past 30 hour(s))   PHOSPHORUS    Collection Time: 03/08/21  4:26 AM   Result Value    PHOSPHORUS 4.7   MAGNESIUM    Collection Time: 03/08/21  4:26 AM   Result Value    MAGNESIUM 1.5 (L)      Liver/Pancreas Enzyme Results Liver Function Results   No results found for this or any previous visit (from the past 30 hour(s)). No results found for this or any previous visit (from the past 30 hour(s)).   Cardiac Results Coags Results   Results for orders placed or performed during the hospital encounter of 02/28/21 (from the past 30 hour(s))   TROPONIN-I    Collection Time: 03/07/21  9:14 PM   Result Value    TROPONIN I 29    Recent Results (from the past 30 hour(s))   PT/INR    Collection Time: 03/08/21  4:26 AM   Result Value    PROTHROMBIN TIME 38.5 (H)    INR 3.27 (H)        Radiology:  I reviewed imaging    PT/OT: Yes    Consults: Nephrology    Assessment/ Plan:   51 yo male with a significant PMH of HTN, HLD, DM, CAD s/p multiple PCI x10, HFpEF, PAD s/p stenting, ESRD (on dialysis), COPD, BKA, osteomyelitis, and substance use disorder who presented after missing dialysis with hyperkalemia, hypotension, and bradycardia. He received atropine and epinephrine drip while in ICU, and then underwent emergent dialysis with resolution of hyperkalemia and bradycardia.     Acute on chronic hypoxemic respiratory failure secondary to volume overload from missed HD (improved)  HFpEF, possible decompensated HF  H/o COPD Suspect PNA  - BNP 1114 on admission  - Continue to diurese.  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  Non-STEMI Most probably type 2 demand ischemia  HTN  HLD  PAD s/p stenting  - Troponinpeaked at 105 and has downtrended  - Continue ASA 81 mg daily  - Per patient he's not on brilinta anymore, and last cardiac stent was >1 yr ago, will hold brilinta for now, to be addressed by outpatient cardiologist after discharge  - Hold lisinopril, aldactone and Norvasc in setting of soft BP and electrolyte imbalance  - Restarted home Coreg 25 mg BID and Imdur 60 mg daily  - TTE as above    History of DVT   - INR 3.9 on admission, warfarin held at that time but was restarted on 2/20  - INR 3.27 today  - Coumadin dosed per pharmacy    Acute encephalopathy, likely due to uremia in setting of missed dialysis, cannot r/o delirium due to sepsis and hospitalization  - CT brain with no acute process  - Delirium precautions    Normocytic normochromic anemia  - Likely anemia of chronic disease due to ESRD, possibly compounded by ongoing hematuria and iron deficiency  - Transfuse if Hb <7  - Continue iron supplementation    Sepsis, likely due to pneumonia given bilateral pulmonary infiltrates on CT chest, UA negative for UTI  - Possible component of aspiration due to AMS  - Completed ABX    UncontrolledDiabetes mellitus  - Lantus 8 units daily and SSI       DVT/PE Prophylaxis: Warfarin    Disposition Planning: Skilled Nursing Unit     Peer to peer completed today with [employer].      On 03/08/2021 I spent a total visit time of 55 minutes. Time included review of tests and ordering tests, obtaining/reviewing history, examining the patient, communicating with consultants, documenting clinical  information and counseling the patient and/or family regarding the diagnosis and management plan and coordination of care involved services directly related to patient care.    FOLLOW UP NOTE LEVEL 3 (TOTAL TIME > 50 MINUTES) ([id number])    Allie Bossier, DO

## 2021-03-08 NOTE — Care Management Notes (Signed)
[facility]  Care Management Note    Patient Name: Max [name].  Date of Birth: 1970-01-21  Sex: male  Date/Time of Admission: 02/28/2021  2:08 AM  Room/Bed: 08/A  Payor: [employer] MEDICARE / Plan: [employer] MEDICARE / Product Type: MEDICARE [facility] /    LOS: 8 days   Primary Care Providers:  Azzie Roup, MD, MD (General)    Admitting Diagnosis:  Hyperkalemia [E87.5]    Assessment:      03/08/21 1445   Assessment Details   Assessment Type Continued Assessment   Date of Care Management Update 03/08/21   Date of Next DCP Update 03/11/21   Medicare Intent to Discharge Documentation   Discharge IMM give to: Patient   Discharge IMM Letter Given Date 03/08/21   Discharge IMM Letter Given Time 1440   Care Management Plan   Discharge Planning Status plan in progress   Projected Discharge Date 03/09/21   Discharge Needs Assessment   Discharge Facility/Level of Care Needs SNF Return (Medicare certified)(code 3)   Transportation Available ambulance   Per service, patient to be ready for discharge after his dialysis treatment tomorrow.  [place] spoke with Aurora Mask from [facility] and patient will be accepted back to [facility] tomorrow.  He will admit to them under his Medicaid benefit as his [employer] Medicare has denied P2P(they state he is at his baseline).  He will not need a Covid or [place] PAS per [place].  Ambulance scheduled for 3pm tomorrow to transport patient back to facility.  Per, Zella Ball, his dialysis is to return to normal schedule once he returns to facility.  Patient given IMM and FOC.  He verbalized understanding of both forms.    Discharge Plan:  SNF Placement ( Medicaid only certified) (code 35)  Patient to discharge back to [facility] tomorrow at 3pm via EMS.    The patient will continue to be evaluated for developing discharge needs.     Case Manager: Claudius Sis, [place] COORDINATOR  Phone: [phone]

## 2021-03-08 NOTE — Care Plan (Signed)
03/08/21 1031   Therapist Pager   PT Assigned/ Pager # [phone]   Rehab Session   Document Type rehab contact note   Total PT Minutes: 0   Basic Mobility Am-PAC/6Clicks Score (APPROVED PT Staff, [facility], [place], [place], [place], and [facility])   Patient Mobility Barrier Patient off floor for procedure/test           Pt out for dialysis.

## 2021-03-08 NOTE — Consults (Signed)
[facility]  [facility] Advanced Wound Care   Initial Consult    Max [name], Russomanno., 51 y.o. male  MRN: [id number]  Date of Birth:  08-04-1970  Date of service: 03/08/2021  Encounter Start Date: 02/28/2021  Inpatient Admission Date:  02/28/2021  Hospital Day:  LOS: 8 days     Information Obtained from: patient, health care provider and history reviewed via medical record  Chief Complaint: pressure ulcer of heel    PCP: Azzie Roup, MD  Consult Requested By: Lucilla Lame, PA-C  03/06/21 1331  IP CONSULT TO ADVANCED WOUND CARE TEAM  ONE TIME     Complete     Process Instructions: Please call extension [phone] from Monday-Friday 8:00am - 4:00pm with questions/concerns.      References:    ON CALL (Paden)   Provider: (Not yet assigned)   Question Answer Comment   Reason for consult: PRESSURE ULCER    Pressure Ulcer Location(s): heal    Evaluate and: WRITE ORDERS ONLY    Is the patient being discharged today? No               HPI:    Max Desmith. is a 51 y.o., White male with a past medical history of HTN, HLD, DM2, CAD s/p multiple PCI, CHD, PAD s/p stents, CKD on HD, COPD, R BKA s/p osteomyelitis, substance abuse, who was admitted to [facility] on 02/28/21 for lightheadedness, fatigue, and altered mental status. Advanced wound care was consulted on day 6 of admission for pressure ulcer of heel. The wound has been present for unknown amount of time and he is unsure how it began. He does report that he did not have adequate blood flow to the LLE and had to have stents placed 7-8 months ago in his left leg. He also had a R BKA 3 months ago and has a small wound on his incision that never healed. Drainage has been moderate and yellow. Patient states pain in the wound is 0/10. Treatments tried are covering with border dressing. Current regimen is border dressing. Patient does not follow with a wound care center for the wound. Patient denies fever or chills, redness/streaking of the wound, malodorous  drainage or increased pain.  PAST MEDICAL/ FAMILY/ SOCIAL HISTORY:       Past Medical History:   Diagnosis Date    Arthropathy     CKD (chronic kidney disease), stage III (CMS HCC) 08/25/2019    Congestive heart failure (CMS HCC)     Coronary artery disease     Diabetes mellitus, type 2 (CMS HCC)     Headache     HTN (hypertension)          Allergies   Allergen Reactions    Ultram [Tramadol] Rash    Benadryl [Diphenhydramine]  Other Adverse Reaction (Add comment)     irritable    Penicillin G Benethamine      Medications Prior to Admission       Prescriptions    amLODIPine (NORVASC) 10 mg Oral Tablet    Take 1 Tablet (10 mg total) by mouth Once a day    aspirin (ECOTRIN) 81 mg Oral Tablet, Delayed Release (E.C.)    Take 1 Tablet (81 mg total) by mouth Once a day    atorvastatin (LIPITOR) 80 mg Oral Tablet    Take 1 Tablet (80 mg total) by mouth Every evening    carvediloL (COREG) 25 mg Oral Tablet    Take 1  Tablet (25 mg total) by mouth Twice daily with food    cyclobenzaprine (FLEXERIL) 5 mg Oral Tablet    Take 1 Tablet (5 mg total) by mouth Three times a day    docusate sodium (COLACE) 100 mg Oral Capsule    Take 1 Capsule (100 mg total) by mouth Twice daily    DULoxetine (CYMBALTA DR) 60 mg Oral Capsule, Delayed Release(E.C.)    Take 1 Capsule (60 mg total) by mouth Once a day    esomeprazole magnesium (NEXIUM) 20 mg Oral Capsule, Delayed Release(E.C.)    Take 1 Capsule (20 mg total) by mouth Every morning before breakfast    ezetimibe (ZETIA) 10 mg Oral Tablet    Take 1 Tablet (10 mg total) by mouth Every evening    ferrous sulfate (FERATAB) 324 mg (65 mg iron) Oral Tablet, Delayed Release (E.C.)    Take 1 Tablet (324 mg total) by mouth    gabapentin (NEURONTIN) 400 mg Oral Capsule    Take 1 Capsule (400 mg total) by mouth Three times a day    HYDROcodone-acetaminophen (NORCO) 5-325 mg Oral Tablet    Take 1 Tablet by mouth Every 8 hours as needed for Pain    insulin glargine 100 unit/mL Subcutaneous injection  (vial)    Inject 25 Units under the skin Twice daily - in morning and at bedtime    isosorbide mononitrate (IMDUR) 60 mg Oral Tablet Sustained Release 24 hr    Take 1 Tablet (60 mg total) by mouth Every morning    midodrine (PROAMITINE) 5 mg Oral Tablet    Take 1 Tablet (5 mg total) by mouth Three times a day    ranolazine (RANEXA) 500 mg Oral Tablet Sustained Release 12 hr    Take 1 Tablet (500 mg total) by mouth Twice daily    sevelamer (RENAGEL) 800 mg Oral Tablet    Take 1 Tablet (800 mg total) by mouth Three times daily with meals    sodium zirconium cyclosilicate (LOKELMA) 10 gram Oral Powder in Packet    Take 1 Packet (10 g total) by mouth Once a day    spironolactone (ALDACTONE) 50 mg Oral Tablet    Take 1 Tablet (50 mg total) by mouth Every morning with breakfast    sucralfate (CARAFATE) 1 gram Oral Tablet    Take 1 Tablet (1 g total) by mouth Three times daily before meals    ticagrelor (BRILINTA) 90 mg Oral Tablet    Take 1 Tablet (90 mg total) by mouth Twice daily    torsemide (DEMADEX) 20 mg Oral Tablet    Take 1 Tablet (20 mg total) by mouth Twice daily    Warfarin (COUMADIN) 4 mg Oral Tablet    Take 1 Tablet (4 mg total) by mouth Once a day           [Held by provider] amLODIPine (NORVASC) tablet, 10 mg, Oral, Daily  aspirin chewable tablet 81 mg, 81 mg, Oral, Daily  atorvastatin (LIPITOR) tablet, 80 mg, Oral, QPM  bacitracin 500 units/gram topical ointment packet, 1 Packet, Apply Topically, 2x/day PRN  carvedilol (COREG) tablet, 25 mg, Oral, 2x/day-Food  [Held by provider] cyclobenzaprine (FLEXERIL) tablet, 5 mg, Oral, Q8H PRN  D5W 250 mL flush bag, , Intravenous, Q15 Min PRN  DULoxetine (CYMBALTA) delayed release capsule, 60 mg, Oral, Daily  ezetimibe (ZETIA) tablet, 10 mg, Oral, QPM  ferrous sulfate 324 mg (65 mg elemental IRON) tablet, 324 mg, Oral, Daily before Breakfast  gabapentin (NEURONTIN) capsule,  400 mg, Oral, 3x/day  insulin glargine-yfgn 100 units/mL injection, 8 Units, Subcutaneous,  Daily  isosorbide mononitrate (IMDUR) 24 hr extended release tablet, 60 mg, Oral, Daily  lidocaine-menthol (LIDOPATCH) 3.6%-1.25% patch, 1 Patch, Transdermal, Daily  [Held by provider] lisinopril (PRINIVIL) tablet, 5 mg, Oral, Daily  magnesium sulfate 4 G in SW 100 mL premix IVPB, 4 g, Intravenous, Once  NS 250 mL flush bag, , Intravenous, Q15 Min PRN  NS flush syringe, 2-6 mL, Intracatheter, Q8HRS  NS flush syringe, 2-6 mL, Intracatheter, Q1 MIN PRN  ondansetron (ZOFRAN) 2 mg/mL injection, 4 mg, Intravenous, Q8H PRN  oxyCODONE (ROXICODONE) immediate release tablet, 5 mg, Oral, Q6H PRN  pantoprazole (PROTONIX) delayed release tablet, 40 mg, Oral, Daily before Breakfast  polyethylene glycol (MIRALAX) oral packet, 17 g, Oral, Daily  premixed hemodialysate (NATURALYTE) 3.43 L with potassium chloride 2 mEq/L, calcium chloride 2.5 mEq/L, , Hemodialysis, Give in Dialysis  ranolazine (RANEXA) extended release tablet, 500 mg, Oral, 2x/day  sennosides-docusate sodium (SENOKOT-S) 8.6-50mg  per tablet, 1 Tablet, Oral, 2x/day  sevelamer carbonate (RENVELA) tablet, 800 mg, Oral, 3x/day-Meals  sodium citrate 4% injection syringe, 2 Syringe, Intracatheter, Give in Dialysis  sodium zirconium cyclosilicate (LOKELMA) powder, 10 g, Oral, Daily  SSIP insulin lispro 100 units/mL injection, 0-12 Units, Subcutaneous, Q4H PRN  torsemide (DEMADEX) tablet, 20 mg, Oral, 2x/day  warfarin (COUMADIN) tablet, 2 mg, Oral, NIGHTLY  Warfarin - Pharmacist to Dose per Protocol, , Does not apply, Daily PRN      Past Surgical History:   Procedure Laterality Date    AMPUTATION FOOT / TOE Left     CORONARY ARTERY ANGIOPLASTY      HX CATARACT REMOVAL Bilateral          Family History:    Family Medical History:       Problem Relation (Age of Onset)    Cancer Father    Coronary Artery Disease Mother, Father              Social History     Tobacco Use    Smoking status: Never    Smokeless tobacco: Never   Vaping Use    Vaping Use: Never used   Substance Use  Topics    Alcohol use: Never    Drug use: Never       Social Determinants of Company secretary Strain: Not on file   Transportation Needs: Not on file   Social Connections: Not on file   Intimate Partner Violence: Not on file   Housing Stability: Not on file   Health Literacy: Not on file   Employment Status: Not on file         ROS:  MUST comment on all "Abnormal" findings   Constitutional: negative for fevers, chills, fatigue, and weight loss  Eyes: negative for visual disturbance  ENT: negative for hearing loss and sore throat  Respiratory: negative for cough, wheezing  Cardiovascular: negative for chest pressure/discomfort, no claudication, + lower extremity edema  Gastrointestinal: negative for dysphagia, nausea, vomiting, diarrhea, decrease in appetite   Integumentary: negative for rash, changes in skin color, dryness  Musculoskeletal: negative for myalgias, muscle weakness, joint pain  Neurological: negative for dizziness, no paresthesia/paraplegia  Behavioral/Psych: negative for anxiety, depression  All remaining systems negative.    PHYSICAL EXAMINATION: MUST comment on all "Abnormal" findings    Exam Temperature: 36.8 C (98.3 F)  Heart Rate: 72  BP (Non-Invasive): 122/68  Respiratory Rate: 16  SpO2: 96 %  Constitutional: acutely ill; appears in no acute distress  Eyes: PERRL, conjunctiva non-icteric   ENT: mucous membranes moist, trachea midline  Respiratory: non-labored on RA, no cyanosis  Cardiovascular: no edema, +1 L dorsalis pedis pulse  Neurologic: A&Ox3; grossly intact  Musculoskeletal: Equal strength to bilateral upper/lower extremities, head normocephalic  Psychiatric: Normal affect; cooperative with exam, pleasant  Integumentary: skin warm and dry, open wound to right BKA and left lateral heel, no signs/symptoms of infection, no hemosiderin staining noted, no erythema, no xerosis    Wound #1  Type: Vascular Ulcer  Location: left lateral heel  Length: 0.8 cm  Width: 0.9 cm  Depth:  0 cm  Undermining/Tunneling: none  Wound Base: fibrin  Wound Edges: smooth  Drainage Amount: Moderate  Yellow drainage with odor Absent  Periwound: without periwound erythema, crepitus, necrosis or gangrene      Wound #2  Type: Surgical  Location: right BKA  Length: 1.4 cm  Width: 0.5 cm  Depth: 0 cm  Undermining/Tunneling: no  Wound Base: fibrin  Wound Edges: smooth  Drainage Amount: Moderate  Yellow drainage with odor Absent  Periwound: without periwound erythema, crepitus, necrosis or gangrene        Labs Ordered/ Reviewed (Please indicate ordered or reviewed)   Reviewed: Labs:  I have reviewed all lab results.  Lab Results Today:    Results for orders placed or performed during the hospital encounter of 02/28/21 (from the past 24 hour(s))   POC BLOOD GLUCOSE (RESULTS)   Result Value Ref Range    GLUCOSE, POC 196 (H) 70 - 105 mg/dl   POC BLOOD GLUCOSE (RESULTS)   Result Value Ref Range    GLUCOSE, POC 177 (H) 70 - 105 mg/dl   TROPONIN-I   Result Value Ref Range    TROPONIN I 22 0 - 30 ng/L   POC BLOOD GLUCOSE (RESULTS)   Result Value Ref Range    GLUCOSE, POC 196 (H) 70 - 105 mg/dl   TROPONIN-I   Result Value Ref Range    TROPONIN I 29 0 - 30 ng/L   POC BLOOD GLUCOSE (RESULTS)   Result Value Ref Range    GLUCOSE, POC 94 70 - 105 mg/dl   PT/INR   Result Value Ref Range    PROTHROMBIN TIME 38.5 (H) 9.1 - 13.9 seconds    INR 3.27 (H) 0.80 - 1.61   BASIC METABOLIC PANEL, NON-FASTING   Result Value Ref Range    SODIUM 133 (L) 136 - 145 mmol/L    POTASSIUM 6.0 (HH) 3.5 - 5.1 mmol/L    CHLORIDE 102 96 - 111 mmol/L    CO2 TOTAL 22 22 - 30 mmol/L    ANION GAP 9 4 - 13 mmol/L    CALCIUM 8.7 8.5 - 10.0 mg/dL    GLUCOSE 127 (H) 65 - 125 mg/dL    BUN 61 (H) 8 - 25 mg/dL    CREATININE 3.72 (H) 0.75 - 1.35 mg/dL    BUN/CREA RATIO 16 6 - 22    ESTIMATED GFR 19 (L) >=60 mL/min/BSA   MAGNESIUM   Result Value Ref Range    MAGNESIUM 1.5 (L) 1.8 - 2.6 mg/dL   PHOSPHORUS   Result Value Ref Range    PHOSPHORUS 4.7 2.4 - 4.7 mg/dL   CBC  WITH DIFF   Result Value Ref Range    WBC 12.7 (H) 3.7 - 11.0 x10^3/uL    RBC 2.72 (L) 4.50 - 6.10 x10^6/uL    HGB 7.5 (  L) 13.4 - 17.5 g/dL    HCT 24.8 (L) 38.9 - 52.0 %    MCV 91.2 78.0 - 100.0 fL    MCH 27.6 26.0 - 32.0 pg    MCHC 30.2 (L) 31.0 - 35.5 g/dL    RDW-CV 16.5 (H) 11.5 - 15.5 %    PLATELETS 454 (H) 150 - 400 x10^3/uL    MPV 9.9 8.7 - 12.5 fL    NEUTROPHIL % 60 %    LYMPHOCYTE % 17 %    MONOCYTE % 19 %    EOSINOPHIL % 2 %    BASOPHIL % 1 %    NEUTROPHIL # 7.67 1.50 - 7.70 x10^3/uL    LYMPHOCYTE # 2.19 1.00 - 4.80 x10^3/uL    MONOCYTE # 2.37 (H) 0.20 - 1.10 x10^3/uL    EOSINOPHIL # 0.25 <=0.50 x10^3/uL    BASOPHIL # <0.10 <=0.20 x10^3/uL    IMMATURE GRANULOCYTE % 1 0 - 1 %    IMMATURE GRANULOCYTE # <0.10 <0.10 x10^3/uL   POC BLOOD GLUCOSE (RESULTS)   Result Value Ref Range    GLUCOSE, POC 119 (H) 70 - 105 mg/dl       ALBUMIN (g/dL )   Date Value   02/28/2021 2.0 (L)   02/27/2021 2.0 (L)     HEMOGLOBIN A1C (%)   Date Value   02/28/2021 5.6   08/25/2019 11.2 (H)       Ordered: N/A    Radiology Tests Ordered/ Reviewed (Please indicate ordered or reviewed)   Reviewed: I have reviewed all data imaging to date.   Laurie Rosezella [place].     RADIOLOGIST: Otho Najjar, MD     CT CHEST ABDOMEN PELVIS WO IV CONTRAST performed on 02/27/2021 11:06 PM     CLINICAL HISTORY: On warfarin with hypotension in heart failure and kidney failure.  missed dialysis, on warfarin, hypotension, heart failure and kidney failure      TECHNIQUE:  Chest, abdomen and pelvis CT without intravenous contrast.     COMPARISON:  None.  # of known CTs in the past 12 months:  0   # of known Cardiac Nuclear Medicine Studies in the past 12 months:  0     FINDINGS:  CT CHEST:  Lung windows:  There are tiny bilateral pleural effusions suggested. In the posterior sulci there are focal bilateral right greater than left consolidative infiltrates that have the appearance of pneumonitis. This would be difficult to visualize on plain radiographs unless  lateral films were obtained.     Mediastinum: There is coronary artery calcification. There is cardiomegaly. There is a central line extending into the SVC on the right.     There are scattered lymph nodes present that are not size significant.     Bone windows: No acute bony abnormality. Lower thoracic degenerative change.     IMPRESSION:  Bilateral inflammatory appearing lower lobe infiltrates with tiny effusions.        CT ABDOMEN/PELVIS:  Noncontrast technique limits evaluation of the abdominal and pelvic viscera.     Abdomen: No focal abnormality of the unenhanced liver, spleen, or pancreas.     There appears to be a couple of tiny gallstones layering dependently. No gross evidence of cholecystitis.     The aorta is moderately calcified. At the level of the left renal hilum there is a 13 mm periaortic node. Smaller periaortic nodes are also demonstrated on the left and right.     There is anasarca present with  fatty haziness mildly involving the intraperitoneal fat as well.     Kidneys: No acute renal abnormality. No obstruction. Calcification in the left and right renal pelvis appears to be vascular.     Pelvis: Bladder contains a Foley catheter and is empty. There is moderate pelvic fluid present that is nonspecific.     There is concentric rectal mucosa thickening (image [id number]), suggesting proctitis. Clinical correlation recommended.     Attention to the colon demonstrates a moderate amount of fecal material. Mucosa of the transverse and left colon is slightly prominent, probably normal however. Early colitis is a consideration. There is a normal appendix which is just under the surface of the right anterior abdomen. The cecum is mobile and extends towards the center of the pelvis.     Small bowel contains moderate amount of fluid with some air-fluid levels. This suggests an ileus.     Bone windows: No acute bony abnormality. Schmorl's node defects at L3-4 and particular. Endplate degenerative change  present at this level.     IMPRESSION:  1. Anasarca.  2. Possible proctitis.  3. Possible small bowel ileus.  4. Probable cholelythiasis.  5. Few prominent periaortic lymph nodes.  6. Other findings as above.     One or more dose reduction techniques were used (e.g., Automated exposure control, adjustment of the mA and/or kV according to patient size, use of iterative reconstruction technique).     Ordered: PVRs/ABIs ordered    Assessment/Plan   Holden Maniscalco. is a 51 y.o., White male with a past medical history of HTN, HLD, DM2, CAD s/p multiple PCI, CHD, PAD s/p stents, CKD on HD, COPD, R BKA s/p osteomyelitis, substance abuse, who was admitted to [facility] on 02/28/21 for lightheadedness, fatigue, and altered mental status. Advanced wound care was consulted on day 6 of admission for pressure ulcer of heel.    Ischemic Ulcer of left lateral heel, with fat layer exposed  - Clean wound with wound cleanser and 4x4 gauze. Apply santyl (nickel thick) to wound bed followed by maxorb II square (materials # [id number]) cut to the size of the wound then cover with a border dressing and change daily. DO NOT USE ANY SILVER PRODUCTS (AG) AS THIS WILL DEACTIVATE THE SANTYL.   -PVRs/ABIs ordered    Non-healing surgical wound of right BKA, with fat layer exposed  - Clean wound with wound cleanser and 4x4 gauze. Apply santyl (nickel thick) to wound bed followed by maxorb II square (materials # [id number]) cut to the size of the wound then cover with a border dressing and change daily. DO NOT USE ANY SILVER PRODUCTS (AG) AS THIS WILL DEACTIVATE THE SANTYL.     Pressure injury prevention:  -Meticulous hygiene to the involved area.  -Preventive measures-turn and position patient Q2 hours and prn.  -Frequent repositioning when up in the chair.  -Waffle cushion for pressure redistribution when up in the chair.  -Specialty Bed Low Air Loss continued    Nutrition deficit, risk for:  -Albumin 2.0 on 02/28/21  -Wounds present to  left lateral heel and right BKA  -Nutrition services is following this patient.     Type 2 diabetes mellitus:  -HA1C Level 5.6 on 02/28/21  -Tight glycemic control. Management per primary team.    Additional recommendations:  -PT/OT already following this patient.   -Case management for home health needs and discharge planning.  -Follow up should be scheduled upon discharge with the [facility] for Advanced  Wound Healing [phone].    -We will continue to follow patient throughout admission. Please call if there are any questions/concerns prior to follow up.    Astrid Drafts, FNP-C  03/08/2021, 10:42  [facility]  Ext: [phone]        Attending Note:    Reviewed APP note. Patient was seen and examined by me. I agree with the findings and plan of care as documented in the NP's note. Any exceptions/additions are edited/noted.    Seth Bake, MD  03/08/2021, 14:25

## 2021-03-08 NOTE — Nurses Notes (Signed)
Patient arrived back to room 10se8 from HD.

## 2021-03-08 NOTE — Care Plan (Signed)
Patient s/p HD today with 3L removed. Plan SNF return possibly tomorrow. Education provided on pain, skin breakdown and fall prevention, plan of care reviewed with patient and understanding verbalized. Activity encouraged. IS use encouraged and education provided on importance of use q2 hours. Patient encouraged to use call bell for assistance. Patient resting with call bell within reach. Hourly rounds performed via RN/CA. Will continue to monitor patient.     Quintin Alto, RN  03/08/2021, 14:38    Problem: Adult Inpatient Plan of Care  Goal: Plan of Care Review  Outcome: Ongoing (see interventions/notes)  Goal: Patient-Specific Goal (Individualized)  Outcome: Ongoing (see interventions/notes)  Flowsheets (Taken 03/08/2021 1245)  Individualized Care Needs: educated on diet restrictions  Anxieties, Fears or Concerns: wants chocolate milk constantly  Goal: Absence of Hospital-Acquired Illness or Injury  Outcome: Ongoing (see interventions/notes)  Intervention: Identify and Manage Fall Risk  Recent Flowsheet Documentation  Taken 03/08/2021 1245 by Quintin Alto, RN  Safety Promotion/Fall Prevention:   activity supervised   fall prevention program maintained   motion sensor pad activated   nonskid shoes/slippers when out of bed   safety round/check completed  Intervention: Prevent Skin Injury  Recent Flowsheet Documentation  Taken 03/08/2021 1245 by Quintin Alto, RN  Body Position:   turned q 2 hours   weight shift assistance provided  Skin Protection:   adhesive use limited   tubing/devices free from skin contact   pulse oximeter probe site changed   electrode sites changed   incontinence pads utilized   skin-to-device areas padded  Intervention: Prevent and Manage VTE (Venous Thromboembolism) Risk  Recent Flowsheet Documentation  Taken 03/08/2021 1245 by Quintin Alto, RN  VTE Prevention/Management:   ambulation promoted   dorsiflexion/plantar flexion performed   anticoagulant therapy maintained  Intervention:  Prevent Infection  Recent Flowsheet Documentation  Taken 03/08/2021 1245 by Quintin Alto, RN  Infection Prevention:   barrier precautions utilized   cohorting utilized   equipment surfaces disinfected   glycemic control managed   personal protective equipment utilized   promote handwashing   rest/sleep promoted   visitors restricted/screened   single patient room provided  Goal: Optimal Comfort and Wellbeing  Outcome: Ongoing (see interventions/notes)  Intervention: Provide Person-Centered Care  Recent Flowsheet Documentation  Taken 03/08/2021 1245 by Quintin Alto, RN  Trust Relationship/Rapport:   care explained   choices provided   questions answered   questions encouraged   thoughts/feelings acknowledged  Goal: Rounds/Family Conference  Outcome: Ongoing (see interventions/notes)     Problem: Skin Injury Risk Increased  Goal: Skin Health and Integrity  Outcome: Ongoing (see interventions/notes)  Intervention: Optimize Skin Protection  Recent Flowsheet Documentation  Taken 03/08/2021 1245 by Quintin Alto, RN  Pressure Reduction Techniques:   heels elevated off bed   frequent weight shift encouraged   (L,M,H,VH)Frequent Turning   (L,M,H,VH) Turn Schedule - Minimum every 2 hours   (L,M,H,VH) Maximal Remobilization   (L,M,H,VH) Manage Moisture, Nutrition & Shear   (M,H,VH) 30 degree Lateral Positioning   (H,VH) Increase Frequency of Turning   (H,VH) Supplemented with Small Shifts   pressure points protected   weight shift assistance provided  Pressure Reduction Devices: heels elevated off bed  Skin Protection:   adhesive use limited   tubing/devices free from skin contact   pulse oximeter probe site changed   electrode sites changed   incontinence pads utilized   skin-to-device areas padded  Activity Management: activity adjusted per tolerance  Head of Bed (HOB) Positioning: HOB elevated  Problem: Hemodialysis  Goal: Safe, Effective Therapy Delivery  Outcome: Ongoing (see interventions/notes)  Goal: Effective  Tissue Perfusion  Outcome: Ongoing (see interventions/notes)  Goal: Absence of Infection Signs and Symptoms  Outcome: Ongoing (see interventions/notes)  Intervention: Prevent or Manage Infection  Recent Flowsheet Documentation  Taken 03/08/2021 1245 by Quintin Alto, RN  Infection Prevention:   barrier precautions utilized   cohorting utilized   equipment surfaces disinfected   glycemic control managed   personal protective equipment utilized   promote handwashing   rest/sleep promoted   visitors restricted/screened   single patient room provided

## 2021-03-08 NOTE — Pharmacy (Signed)
[place] / [place]    Pharmacist Consult for Warfarin Dosing- Progress Note      Max [name]. is an 51 y.o. patient on warfarin for VTE to target a goal INR of 2 - 3. Warfarin was a continuation from prior to admission    Prior to Admission Regimen: Warfarin 4 mg daily  Pertinent details on prior to admission dosing schedule (missed/held doses, etc.): INR supratherapeutic on admission (3.9)    Most Recent Inpatient Labs  Lab Results   Component Value Date    HGB 7.5 (L) 03/08/2021    HCT 24.8 (L) 03/08/2021    ALBUMIN 2.0 (L) 02/28/2021    RBC 2.72 (L) 03/08/2021    PROTHROMTME 38.5 (H) 03/08/2021    APTT 34.6 08/26/2019    TOTBILIRUBIN 0.6 02/28/2021    AST 37 02/28/2021    ALT 10 02/28/2021    PLTCNT 454 (H) 03/08/2021       Current Diet Order:  MNT PROTOCOL FOR DIETITIAN  DIET DIABETIC Calorie amount: CC 2000; Additional modifications/limitations: LOW PHOSPHORUS, 2 G POTASSIUM  DIETARY ORAL SUPPLEMENTS Oral Supplements with tray: Ensure Enlive-Chocolate; LUNCH/DINNER; 1 Each       Inpatient Warfarin Dosing:     Date  INR  Warfarin    Dose   Bridging    Regimen              (if applicable)   Concurrent        Antiplatelet(s)  Drug Interactions  Comments   03/01/21 1.6 4 mg none      2/21 1.43 4mg  none      2/22 1.42 4mg  none      2/23 1.69 4mg  none      2/24 2.17 4mg  none      2/25 2.73 2mg  None      02/26 3.14 2mg  None      2/27 3.27 2mg  none                            Assessment & Plan    . The INR today is slightly supratherapeutic  . Recommend continuing with the reduced dose of 2mg  as patient had a significant drop in INR when warfarin was previously held and INR has likely peaked as he is 3 days from last 4mg  dose.  . Recheck INR in AM  . Monitor for signs/symptoms of bleeding  . Plan for outpatient management (if known) per primary team or care management:     To be determined           Please contact team pharmacist with any questions about warfarin management.

## 2021-03-08 NOTE — Nurses Notes (Signed)
Patient refused breakfast. Confirmed with Dr Lindell Spar that he still wanted patient to receive scheduled po meds and long acting insulin. Meds administered.

## 2021-03-08 NOTE — Nurses Notes (Signed)
Patient transported to dialysis

## 2021-03-08 NOTE — Nurses Notes (Signed)
HD treatment completed. 3L UF. Vitals stable.

## 2021-03-08 NOTE — OT Treatment (Signed)
OT attempted to provide care to Max [name]. today at 10:51. Care could not be delivered at that time due to off the floor for, or receiving dialysis.   Will attempt again when appropriate.   Trellis Moment, OTR/L  [phone]

## 2021-03-09 ENCOUNTER — Inpatient Hospital Stay (HOSPITAL_COMMUNITY): Payer: Medicare (Managed Care)

## 2021-03-09 ENCOUNTER — Other Ambulatory Visit: Payer: Self-pay

## 2021-03-09 DIAGNOSIS — Z955 Presence of coronary angioplasty implant and graft: Secondary | ICD-10-CM

## 2021-03-09 LAB — BASIC METABOLIC PANEL
ANION GAP: 7 mmol/L (ref 4–13)
BUN/CREA RATIO: 14 (ref 6–22)
BUN: 39 mg/dL — ABNORMAL HIGH (ref 8–25)
CALCIUM: 8.6 mg/dL (ref 8.5–10.0)
CHLORIDE: 100 mmol/L (ref 96–111)
CO2 TOTAL: 25 mmol/L (ref 22–30)
CREATININE: 2.77 mg/dL — ABNORMAL HIGH (ref 0.75–1.35)
ESTIMATED GFR: 27 mL/min/BSA — ABNORMAL LOW (ref >=60)
GLUCOSE: 209 mg/dL — ABNORMAL HIGH (ref 65–125)
POTASSIUM: 4.9 mmol/L (ref 3.5–5.1)
SODIUM: 132 mmol/L — ABNORMAL LOW (ref 136–145)

## 2021-03-09 LAB — CBC WITH DIFF
BASOPHIL #: <0.1 10*3/uL (ref <=0.20)
BASOPHIL %: 1 %
EOSINOPHIL #: 0.2 10*3/uL (ref <=0.50)
EOSINOPHIL %: 2 %
HCT: 22.6 % — ABNORMAL LOW (ref 38.9–52.0)
HGB: 7 g/dL — ABNORMAL LOW (ref 13.4–17.5)
IMMATURE GRANULOCYTE #: <0.1 10*3/uL (ref <0.10)
IMMATURE GRANULOCYTE %: 1 % (ref 0–1)
LYMPHOCYTE #: 1.68 10*3/uL (ref 1.00–4.80)
LYMPHOCYTE %: 15 %
MCH: 27.9 pg (ref 26.0–32.0)
MCHC: 31 g/dL (ref 31.0–35.5)
MCV: 90 fL (ref 78.0–100.0)
MONOCYTE #: 2.03 10*3/uL — ABNORMAL HIGH (ref 0.20–1.10)
MONOCYTE %: 18 %
MPV: 9.9 fL (ref 8.7–12.5)
NEUTROPHIL #: 7.03 10*3/uL (ref 1.50–7.70)
NEUTROPHIL %: 63 %
PLATELETS: 490 10*3/uL — ABNORMAL HIGH (ref 150–400)
RBC: 2.51 10*6/uL — ABNORMAL LOW (ref 4.50–6.10)
RDW-CV: 16.7 % — ABNORMAL HIGH (ref 11.5–15.5)
WBC: 11.1 10*3/uL — ABNORMAL HIGH (ref 3.7–11.0)

## 2021-03-09 LAB — ECG 12-LEAD
Atrial Rate: 80 {beats}/min
Calculated P Axis: 27 degrees
Calculated R Axis: 114 degrees
Calculated T Axis: -33 degrees
PR Interval: 178 ms
QRS Duration: 144 ms
QT Interval: 424 ms
QTC Calculation: 489 ms
Ventricular rate: 80 {beats}/min

## 2021-03-09 LAB — POC BLOOD GLUCOSE (RESULTS)
GLUCOSE, POC: 255 mg/dl — ABNORMAL HIGH (ref 70–105)
GLUCOSE, POC: 144 mg/dl — ABNORMAL HIGH (ref 70–105)

## 2021-03-09 LAB — PT/INR
INR: 2.49 — ABNORMAL HIGH (ref 0.80–1.20)
PROTHROMBIN TIME: 29.2 seconds — ABNORMAL HIGH (ref 9.1–13.9)

## 2021-03-09 LAB — MAGNESIUM: MAGNESIUM: 1.9 mg/dL (ref 1.8–2.6)

## 2021-03-09 LAB — PHOSPHORUS: PHOSPHORUS: 3.6 mg/dL (ref 2.4–4.7)

## 2021-03-09 MED ORDER — LIDOCAINE 5 % TOPICAL PATCH
1.0000 | MEDICATED_PATCH | Freq: Every day | CUTANEOUS | Status: AC
Start: 2021-03-09 — End: ?

## 2021-03-09 MED ORDER — POLYETHYLENE GLYCOL 3350 17 GRAM ORAL POWDER PACKET
17.0000 g | Freq: Every day | ORAL | Status: AC
Start: 2021-03-10 — End: ?

## 2021-03-09 MED ORDER — SODIUM CITRATE 4 % (3 ML) INTRA-CATHETER INJECTION SYRINGE
2.0000 | INJECTION | Status: AC
Start: 2021-03-09 — End: 2021-03-09
  Administered 2021-03-09: 2
  Filled 2021-03-09 (×2): qty 6

## 2021-03-09 MED ORDER — COLLAGENASE CLOSTRIDIUM HISTOLYTICUM 250 UNIT/GRAM TOPICAL OINTMENT
TOPICAL_OINTMENT | Freq: Every day | CUTANEOUS | Status: AC
Start: 2021-03-10 — End: 2021-03-22

## 2021-03-09 MED ORDER — INSULIN GLARGINE-YFGN (U-100) 100 UNIT/ML SUBCUTANEOUS SOLUTION
8.0000 [IU] | Freq: Every day | SUBCUTANEOUS | Status: AC
Start: 2021-03-10 — End: ?

## 2021-03-09 MED ORDER — BACITRACIN 500 UNIT/GRAM TOPICAL PACKET
1.0000 | PACK | Freq: Two times a day (BID) | CUTANEOUS | Status: AC | PRN
Start: 2021-03-09 — End: ?

## 2021-03-09 MED ORDER — INSULIN LISPRO 100 UNIT/ML SUB-Q SSIP
0.0000 [IU] | INJECTION | SUBCUTANEOUS | Status: AC | PRN
Start: 2021-03-09 — End: ?

## 2021-03-09 MED ORDER — WARFARIN 3 MG TABLET
3.0000 mg | ORAL_TABLET | Freq: Every day | ORAL | Status: AC
Start: 2021-03-09 — End: ?

## 2021-03-09 MED ORDER — WARFARIN 2 MG TABLET
3.0000 mg | ORAL_TABLET | Freq: Every evening | ORAL | Status: DC
Start: 2021-03-09 — End: 2021-03-09
  Filled 2021-03-09: qty 1

## 2021-03-09 MED ORDER — CYCLOBENZAPRINE 5 MG TABLET
5.0000 mg | ORAL_TABLET | Freq: Three times a day (TID) | ORAL | Status: AC | PRN
Start: 2021-03-09 — End: ?

## 2021-03-09 MED ORDER — PREMIXED HEMODIALYSATE
INJECTION | INTRAVENOUS | Status: AC
Start: 2021-03-09 — End: 2021-03-09

## 2021-03-09 MED ORDER — OXYCODONE 5 MG TABLET
5.0000 mg | ORAL_TABLET | Freq: Four times a day (QID) | ORAL | 0 refills | Status: DC | PRN
Start: 2021-03-09 — End: 2021-04-06

## 2021-03-09 NOTE — Pharmacy (Signed)
[place] / [place]    Pharmacist Consult for Warfarin Dosing- Progress Note      Max Woolum. is an 51 y.o. patient on warfarin for VTE to target a goal INR of 2 - 3. Warfarin was a continuation from prior to admission    Prior to Admission Regimen: Warfarin 4 mg daily  Pertinent details on prior to admission dosing schedule (missed/held doses, etc.): INR supratherapeutic on admission (3.9)    Most Recent Inpatient Labs  Lab Results   Component Value Date    HGB 7.0 (L) 03/09/2021    HCT 22.6 (L) 03/09/2021    ALBUMIN 2.0 (L) 02/28/2021    RBC 2.51 (L) 03/09/2021    PROTHROMTME 29.2 (H) 03/09/2021    APTT 34.6 08/26/2019    TOTBILIRUBIN 0.6 02/28/2021    AST 37 02/28/2021    ALT 10 02/28/2021    PLTCNT 490 (H) 03/09/2021       Current Diet Order:  MNT PROTOCOL FOR DIETITIAN  DIET DIABETIC Calorie amount: CC 2000; Additional modifications/limitations: LOW PHOSPHORUS, 2 G POTASSIUM  DIETARY ORAL SUPPLEMENTS Oral Supplements with tray: Ensure Enlive-Chocolate; LUNCH/DINNER; 1 Each       Inpatient Warfarin Dosing:     Date  INR  Warfarin    Dose   Bridging    Regimen              (if applicable)   Concurrent        Antiplatelet(s)  Drug Interactions  Comments   03/01/21 1.6 4 mg none      2/21 1.43 4mg  none      2/22 1.42 4mg  none      2/23 1.69 4mg  none      2/24 2.17 4mg  none      2/25 2.73 2mg  None      02/26 3.14 2mg  None      2/27 3.27 2mg  none      2/28 2.49 3mg  None                   Assessment & Plan    . The INR today is therapeutic  . Patient has received a total of 22mg  over the past 7 days, with INR at goal will schedule 3mg  daily (weekly total of 21mg ).  . Recheck INR in AM  . Monitor for signs/symptoms of bleeding  . Plan for outpatient management (if known) per primary team or care management:     To be determined           Please contact team pharmacist with any questions about warfarin management.

## 2021-03-09 NOTE — Progress Notes (Signed)
[facility]  Medicine Progress Note    [name].  Date of service: 03/09/2021  Date of Admission:  02/28/2021    Hospital Day:  LOS: 9 days      Subjective: No acute events overnight. Patient evaluated in HD. He reported feeling well, denied having any chest pain, palpitations, N/V, GI or GU symptoms.     Vital Signs:  Temp  Avg: 36.8 C (98.3 F)  Min: 36.7 C (98 F)  Max: 37.1 C (98.8 F)    Pulse  Avg: 77  Min: 70  Max: 82 BP  Min: 110/65  Max: 135/76   Resp  Avg: 16.2  Min: 16  Max: 17 SpO2  Avg: 93.8 %  Min: 90 %  Max: 98 %          Input/Output    Intake/Output Summary (Last 24 hours) at 03/09/2021 1137  Last data filed at 03/09/2021 0750  Gross per 24 hour   Intake 750 ml   Output 3375 ml   Net -2625 ml    I/O last shift:  02/28 0700 - 02/28 1859  In: -   Out: 175 [Urine:175]   [Held by provider] amLODIPine (NORVASC) tablet, 10 mg, Oral, Daily  aspirin chewable tablet 81 mg, 81 mg, Oral, Daily  atorvastatin (LIPITOR) tablet, 80 mg, Oral, QPM  bacitracin 500 units/gram topical ointment packet, 1 Packet, Apply Topically, 2x/day PRN  carvedilol (COREG) tablet, 25 mg, Oral, 2x/day-Food  collagenase (SANTYL) 250 unit/gm ointment, , Apply Topically, Daily  [Held by provider] cyclobenzaprine (FLEXERIL) tablet, 5 mg, Oral, Q8H PRN  D5W 250 mL flush bag, , Intravenous, Q15 Min PRN  DULoxetine (CYMBALTA) delayed release capsule, 60 mg, Oral, Daily  ezetimibe (ZETIA) tablet, 10 mg, Oral, QPM  ferrous sulfate 324 mg (65 mg elemental IRON) tablet, 324 mg, Oral, Daily before Breakfast  gabapentin (NEURONTIN) capsule, 400 mg, Oral, 3x/day  insulin glargine-yfgn 100 units/mL injection, 8 Units, Subcutaneous, Daily  isosorbide mononitrate (IMDUR) 24 hr extended release tablet, 60 mg, Oral, Daily  lidocaine-menthol (LIDOPATCH) 3.6%-1.25% patch, 1 Patch, Transdermal, Daily  [Held by provider] lisinopril (PRINIVIL) tablet, 5 mg, Oral, Daily  NS 250 mL flush bag, , Intravenous, Q15 Min PRN  NS flush syringe, 2-6  mL, Intracatheter, Q8HRS  NS flush syringe, 2-6 mL, Intracatheter, Q1 MIN PRN  ondansetron (ZOFRAN) 2 mg/mL injection, 4 mg, Intravenous, Q8H PRN  oxyCODONE (ROXICODONE) immediate release tablet, 5 mg, Oral, Q6H PRN  pantoprazole (PROTONIX) delayed release tablet, 40 mg, Oral, Daily before Breakfast  polyethylene glycol (MIRALAX) oral packet, 17 g, Oral, Daily  ranolazine (RANEXA) extended release tablet, 500 mg, Oral, 2x/day  sennosides-docusate sodium (SENOKOT-S) 8.6-50mg  per tablet, 1 Tablet, Oral, 2x/day  sevelamer carbonate (RENVELA) tablet, 800 mg, Oral, 3x/day-Meals  SSIP insulin lispro 100 units/mL injection, 0-12 Units, Subcutaneous, Q4H PRN  torsemide (DEMADEX) tablet, 20 mg, Oral, 2x/day  warfarin (COUMADIN) tablet 3 mg, 3 mg, Oral, NIGHTLY  Warfarin - Pharmacist to Dose per Protocol, , Does not apply, Daily PRN    Physical Exam:  General Appearance: Chronically ill-appearing, laying comfortably, in no acute distress and vital signs reviewed  Eyes: Conjunctiva clear, pupils equal and round, sclera non-icteric, EOMI  Lungs: Clear to auscultation bilaterally, breathing comfortably on RA  Heart: S1S2, RRR  Abdomen: Soft, non-tender, non-distended, normoactive bowel sounds  Extremities/MSK: 1+ edema in bilateral lower extremities, right BKA, moves all 4 extremities spontaneously  Integumentary:  Skin warm and dry, no rashes, pale  Neurologic: Grossly normal,  no focal neurologic deficits  Psychiatric: normal mood and affect      Labs:  I reviewed labs  CBC Results Differential Results   Recent Results (from the past 30 hour(s))   CBC WITH DIFF    Collection Time: 03/09/21  4:25 AM   Result Value    WBC 11.1 (H)    HGB 7.0 (L)    HCT 22.6 (L)    PLATELETS 490 (H)    Recent Results (from the past 30 hour(s))   CBC WITH DIFF    Collection Time: 03/09/21  4:25 AM   Result Value    WBC 11.1 (H)    NEUTROPHIL % 63    MONOCYTE % 18    BASOPHIL % 1    BASOPHIL # <0.10      BMP Results Other Chemistries Results    Results for orders placed or performed during the hospital encounter of 02/28/21 (from the past 30 hour(s))   BASIC METABOLIC PANEL, NON-FASTING    Collection Time: 03/09/21  4:25 AM   Result Value    SODIUM 132 (L)    POTASSIUM 4.9    CHLORIDE 100    CO2 TOTAL 25    GLUCOSE 209 (H)    BUN 39 (H)    CREATININE 2.77 (H)    Recent Results (from the past 30 hour(s))   PHOSPHORUS    Collection Time: 03/09/21  4:25 AM   Result Value    PHOSPHORUS 3.6   MAGNESIUM    Collection Time: 03/09/21  4:25 AM   Result Value    MAGNESIUM 1.9      Liver/Pancreas Enzyme Results Liver Function Results   No results found for this or any previous visit (from the past 30 hour(s)). No results found for this or any previous visit (from the past 30 hour(s)).   Cardiac Results Coags Results   No results found for this or any previous visit (from the past 30 hour(s)). Recent Results (from the past 30 hour(s))   PT/INR    Collection Time: 03/09/21  4:25 AM   Result Value    PROTHROMBIN TIME 29.2 (H)    INR 2.49 (H)        Radiology:  I reviewed imaging    PT/OT: Yes    Consults: Nephrology    Assessment/ Plan:   51 yo male with a significant PMH of HTN, HLD, DM, CAD s/p multiple PCI x10, HFpEF, PAD s/p stenting, ESRD (on dialysis), COPD, BKA, osteomyelitis, and substance use disorder who presented after missing dialysis with hyperkalemia, hypotension, and bradycardia. He received atropine and epinephrine drip while in ICU, and then underwent emergent dialysis with resolution of hyperkalemia and bradycardia. Plan for discharge today to SNF.    Acute on chronic hypoxemic respiratory failure (improved) secondary to volume overload from missed HD HFpEF, possible decompensated HF  H/o COPD Suspected PNA, now s/p Abx  - BNP 1114 on admission  - Continue home torsemide 20 mg BID  - TTE showing LVEF 55%  - Completed Vanc, cefepime, and doxycycline for PNA  - patient to continue dialysis after discharge    ESRD on HD   Hyperkalemia, likely due to  ESRD, +_dietary intake  - Nephro following, on HD M/W/F, s/p additional dialysis today 2/28  - continue Lokelma  - low potassium diet  - discontinued aldactone for now due to consistent hyperkalemia    Bradycardia with junctional rhythm status post atropine and epinephrine (resolved), likely due to hyperkalemia  CAD S/Pmultiple PCI (last one 3 months ago) 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  Non-STEMI Most probably type 2 demand ischemia  HTN  HLD  PAD s/p stenting  - Troponinpeaked at 105 and has downtrended  - Continue ASA 81 mg daily  - Per patient he's not on brilinta anymore, and last cardiac stent was >1 yr ago, will hold brilinta for now, to be addressed by outpatient cardiologist after discharge  - Hold lisinopril, aldactone and Norvasc in setting of soft BP and electrolyte imbalance  - Restarted home Coreg 25 mg BID and Imdur 60 mg daily  - TTE as above    History of DVT   - INR 3.9 on admission, warfarin held at that time but was restarted on 2/20  - INR 2.49 today (therapeutic today)  - Coumadin dosed per pharmacy    Acute encephalopathy, likely due to uremia in setting of missed dialysis, cannot r/o delirium due to sepsis and hospitalization  - CT brain with no acute process  - Delirium precautions    Normocytic normochromic anemia  - Likely anemia of chronic disease due to ESRD, possibly compounded by ongoing hematuria and iron deficiency  - Transfuse if Hb <7  - Continue iron supplementation    Sepsis, likely due to pneumonia given bilateral pulmonary infiltrates on CT chest, UA negative for UTI  - Possible component of aspiration due to AMS  - Completed ABX    UncontrolledDiabetes mellitus  - Lantus 8 units daily and SSI       DVT/PE Prophylaxis: Warfarin    Disposition Planning: Skilled Nursing Unit     Peer to peer completed today with [employer].      On 03/09/2021 I spent a total visit time of 55 minutes. Time included review of tests and ordering tests, obtaining/reviewing history, examining the  patient, communicating with consultants, documenting clinical information and counseling the patient and/or family regarding the diagnosis and management plan and coordination of care involved services directly related to patient care.    FOLLOW UP NOTE LEVEL 3 (TOTAL TIME > 50 MINUTES) ([id number])    Avel Peace, MD

## 2021-03-09 NOTE — Nurses Notes (Signed)
03/09/21 1143   Vital Signs   Heart Rate 76   Respiratory Rate 16   BP (Non-Invasive) 107/64   MAP (Non-Invasive) 79 mmHG   BP Source (Non-Invasive) C;Monitor   Patient Position Semi-fowlers (less than 30 degrees)     Patient returned from dialysis. high fall precautions maintained. Call bell within reach.

## 2021-03-09 NOTE — Discharge Summary (Signed)
[facility]  DISCHARGE SUMMARY    PATIENT NAME:  Max [name], Max [name].  MRN:  [id number]  DOB:  1970-04-13    ENCOUNTER DATE:  02/28/2021  INPATIENT ADMISSION DATE: 02/28/2021  DISCHARGE DATE:  03/09/2021    ATTENDING PHYSICIAN: Avel Peace, MD  SERVICE: HOSPITALIST 9  PRIMARY CARE PHYSICIAN: Azzie Roup, MD     PRIMARY DISCHARGE DIAGNOSIS: Hyperkalemia  Active Hospital Problems    Diagnosis Date Noted   . Principal Problem: Hyperkalemia [E87.5] 02/28/2021      Resolved Hospital Problems   No resolved problems to display.     Active Non-Hospital Problems    Diagnosis Date Noted   . Anemia 02/24/2021   . Chest pain 08/25/2019        DISCHARGE MEDICATIONS:     Current Discharge Medication List      START taking these medications.      Details   bacitracin 500 unit/gram Packet   1 Packet, Topical, 2 TIMES DAILY PRN  Refills: 0     collagenase ointment 250 unit/gram Ointment  Commonly known as: SANTYL  Start taking on: March 10, 2021   Apply Topically, DAILY  Refills: 0     insulin lispro 100 units/mL Injectable   0-12 Units, Subcutaneous, EVERY 4 HOURS PRN  Refills: 0     lidocaine 5 % Adhesive Patch, Medicated  Commonly known as: LIDODERM   700 mg, Transdermal, DAILY  Refills: 0     oxyCODONE 5 mg Tablet  Commonly known as: ROXICODONE   5 mg, Oral, EVERY 6 HOURS PRN  Qty: 6 Tablet  Refills: 0     polyethylene glycol 17 gram Powder in Packet  Commonly known as: MIRALAX  Start taking on: March 10, 2021   17 g, Oral, DAILY  Refills: 0        CONTINUE these medications which have CHANGED during your visit.      Details   cyclobenzaprine 5 mg Tablet  Commonly known as: FLEXERIL  What changed:    when to take this   reasons to take this   5 mg, Oral, 3 TIMES DAILY PRN  Refills: 0     insulin glargine-yfgn 100 unit/mL  Start taking on: March 10, 2021  What changed:    how much to take   when to take this   8 Units, Subcutaneous, DAILY  Refills: 0     Warfarin 3 mg Tablet  Commonly known as: COUMADIN  What changed:     medication strength   how much to take   3 mg, Oral, DAILY  Refills: 0        CONTINUE these medications - NO CHANGES were made during your visit.      Details   amLODIPine 10 mg Tablet  Commonly known as: NORVASC   10 mg, Oral, DAILY  Refills: 0     aspirin 81 mg Tablet, Delayed Release (E.C.)  Commonly known as: ECOTRIN   81 mg, Oral, DAILY  Refills: 0     atorvastatin 80 mg Tablet  Commonly known as: LIPITOR   80 mg, Oral, EVERY EVENING  Refills: 0     carvediloL 25 mg Tablet  Commonly known as: COREG   25 mg, Oral, 2 TIMES DAILY WITH FOOD  Refills: 0     docusate sodium 100 mg Capsule  Commonly known as: COLACE   100 mg, Oral, 2 TIMES DAILY  Refills: 0     DULoxetine 60 mg Capsule, Delayed Release(E.C.)  Commonly known as: CYMBALTA DR   60 mg, Oral, DAILY  Refills: 0     esomeprazole magnesium 20 mg Capsule, Delayed Release(E.C.)  Commonly known as: NEXIUM   20 mg, Oral, EVERY MORNING BEFORE BREAKFAST  Refills: 0     ezetimibe 10 mg Tablet  Commonly known as: ZETIA   10 mg, Oral, EVERY EVENING  Refills: 0     ferrous sulfate 324 mg (65 mg iron) Tablet, Delayed Release (E.C.)  Commonly known as: FERATAB   324 mg, Oral  Refills: 0     gabapentin 400 mg Capsule  Commonly known as: NEURONTIN   400 mg, Oral, 3 TIMES DAILY  Refills: 0     isosorbide mononitrate 60 mg Tablet Sustained Release 24 hr  Commonly known as: IMDUR   60 mg, Oral, EVERY MORNING  Refills: 0     Lokelma 10 gram Powder in Packet  Generic drug: sodium zirconium cyclosilicate   10 g, Oral, DAILY  Refills: 0     ranolazine 500 mg Tablet Sustained Release 12 hr  Commonly known as: RANEXA   500 mg, Oral, 2 TIMES DAILY  Refills: 0     sevelamer 800 mg Tablet  Commonly known as: RENAGEL   800 mg, Oral, 3 TIMES DAILY WITH MEALS  Refills: 0     sucralfate 1 gram Tablet  Commonly known as: CARAFATE   1 g, Oral, 3 TIMES DAILY BEFORE MEALS  Refills: 0     torsemide 20 mg Tablet  Commonly known as: DEMADEX   20 mg, Oral, 2 TIMES DAILY  Refills: 0         STOP taking these medications.    HYDROcodone-acetaminophen 5-325 mg Tablet  Commonly known as: NORCO     midodrine 5 mg Tablet  Commonly known as: PROAMITINE     spironolactone 50 mg Tablet  Commonly known as: ALDACTONE     ticagrelor 90 mg Tablet  Commonly known as: BRILINTA          Discharge med list refreshed?  YES     During this hospitalization did the patient have an AMI, PCI/PCTA, STENT or Isolated CABG?  No             ALLERGIES:  Allergies   Allergen Reactions   . Ultram [Tramadol] Rash   . Benadryl [Diphenhydramine]  Other Adverse Reaction (Add comment)     irritable   . Penicillin [facility]):   Bedside Procedures:  No orders of the defined types were placed in this encounter.    Surgical     REASON FOR HOSPITALIZATION AND HOSPITAL COURSE     65 yomalewith a significant PMH of HTN, HLD, DM, CAD s/p multiple PCI x10, HFpEF, PAD s/p stenting, ESRD (on dialysis), COPD, BKA, osteomyelitis, and substance use disorder who presented after missing dialysis with hyperkalemia, hypotension, and bradycardia, as well as respiratory failure due to pulmonary edema. He received atropine and epinephrine drip while in ICU, and then underwent emergent dialysis with resolution of hyperkalemia and bradycardia.His respiratory status improved after dialysis.     Acute on chronic hypoxemic respiratory failure (improved) secondary to volume overload from missed HD HFpEF, possible decompensated HF  H/o COPD Suspected PNA, now s/p Abx  - BNP 1114 on admission  - Continue home torsemide 20 mg BID  - TTE showing LVEF 55%  - Completed Vanc, cefepime, and doxycycline for PNA  - patient to continue dialysis after discharge  ESRD on HD   Hyperkalemia, likely due to ESRD, +_dietary intake  - Nephro following while inpatient, on HD M/W/F, s/p additional dialysis today 2/28  - continue Lokelma  - low potassium diet  - discontinued aldactone for now due to consistent hyperkalemia  - will need BMP  check with HD sessions    Bradycardia with junctional rhythm status post atropine and epinephrine (resolved), likely due to hyperkalemia  CAD S/Pmultiple PCI (last one 3 months ago) 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  Non-STEMI Most probably type 2 demand ischemia  HTN  HLD  PAD s/p stenting  - Troponinpeaked at 105 and has downtrended  - Continue ASA 81 mg daily  - Per patient he's not on brilinta anymore, and last cardiac stent was >1 yr ago, will hold brilinta for now, to be addressed by outpatient cardiologist after discharge  - Hold lisinopril, aldactone and Norvasc in setting of soft BP and electrolyte imbalance  - Restarted home Coreg 25 mg BID and Imdur 60 mg daily  - TTE showing LVEF 55%  - patient to follow up with Cardiology outpatient    History of DVT   - INR 3.9 on admission, warfarin held at that time but was restarted on 2/20  - INR 2.49 today (therapeutic today)  - will need to continue 3 mg warfarin nightly for now, will need INR monitoring in SNF for goal 2-3    Acute encephalopathy, likely due to uremia in setting of missed dialysis, cannot r/o delirium due to sepsis and hospitalization  - CT brain with no acute process  - Delirium precautions    Normocytic normochromic anemia  - Likely anemia of chronic disease due to ESRD, possibly compounded by ongoing hematuria and iron deficiency  - Transfuse if Hb <7  - Continue iron supplementation    Sepsis, likely due to pneumonia given bilateral pulmonary infiltrates on CT chest, UA negative for UTI  - Possible component of aspiration due to AMS  - Completed ABX    UncontrolledDiabetes mellitus  - Lantus 8 units daily and SSI, will continue at discharge, Lantus to be titrated in SNF as needed     TRANSITION/POST DISCHARGE CARE/PENDING TESTS/REFERRALS:   Follow up with Nephrology and Cardiology  Continue Hemodialysis MWF  INR monitoring twice weekly  Adjust Lantus dose as needed  BMP check with HD (MWF)  Low potassium diet    CONDITION ON  DISCHARGE:  A. Ambulation: Full ambulation  B. Self-care Ability: Complete  C. Cognitive Status Alert and Oriented x 3  D. Code status at discharge: Full code    LINES/DRAINS/WOUNDS AT DISCHARGE:   Patient Lines/Drains/Airways Status     Active Line / Dialysis Catheter / Dialysis Graft / Drain / Airway / Wound     Name Placement date Placement time Site Days    Peripheral IV Right Median Cubital  (antecubital fossa) 02/27/21  2023  -- 9    Dialysis Catheter Cuffed/Tunneled --  --  -- --    Wound (Non-Surgical) Anterior;Right Knee 08/26/19  2054  -- 560    Wound (Non-Surgical) Left;Posterior Elbow 02/28/21  0300  -- 9    Wound (Non-Surgical) Left Heel 03/06/21  0800  -- 3              DISCHARGE DISPOSITION:  Skilled Nursing Unit     DISCHARGE INSTRUCTIONS:  Post-Discharge Follow Up Appointments     Follow up with [facility] [place] [place]    Phone: [phone]  Where: [address] [place] [address]           PT/INR --  Please select expected date     [place] (COUMADIN) FOLLOW-UP     Provider to monitor warfarin: Twice a week for next 2 weeks, then weekly thereafter    Next INR to be drawn: 03/10/2021      FOLLOW-UP: [facility] [place] [place]     Follow-up in: [place]    Reason for visit: HOSPITAL DISCHARGE    Follow-up reason: nonhealing surgical wound of right bka and ischemic ulcer of left lateral heel      Avel Peace, MD    Copies sent to Care Team       Relationship Specialty Notifications Start End    Azzie Roup, MD PCP - General HOSPITALIST-INTERNAL MEDICINE Admissions 02/27/21     Phone: [phone] Fax: [phone]         [address] [place] [address]        Referring providers can utilize [email or web address] to access their referred [place] patient's information.

## 2021-03-09 NOTE — Consults (Signed)
[facility]   Section of Nephrology  ESRD Follow-up Consult Note    Max [name], Max [name]. 51 y.o. male  Date of Admission:  02/28/2021  Date of Birth:  1970/08/01  Date of service: 03/09/2021      REASON FOR CONSULT:Dialysis Management     CONSULTING PHYSICIAN:MICU     ASSESSMENT/RECOMMENDATIONS:    51 y.o. male with past medical history of CAD s/p stents(recent one 3 months ago), HFrEF ,PAD s/p stenting , HTN , DM , ESRD (M/W/F)started 3 months back , Rt BKA , COPD transferred from outside facility to MICU for management of Hyperkalemia with EKG changes after he missed his HD session.    ESRD on HD MWF  Follows at [facility] and undergoes dialysis on M/W/F via Rt TCC    Electrolytes- stable   Acid Base- Stable  Volume status- Mild hypervolemic   CKD [place]- Ca,  phos on target.   Anemia- Stable   Antibiotics- none    Recs:  -Getting Extra session of HD today 2/2 Fluid overload   -BFR 400, DFR 700 , 2K bath , UF 2 L as tolerated for 3 hrs   - s/p Emergent HD on 2/19 early AM 2/2 Hyperkalemia with EKG changes  - Maintain MAP > 66mmHg to optimize hemodynamic status  - Avoid nephrotoxins, adjust meds to appropriate GFR  - Daily BMP, mag, phos  - Optimize nutrition   - Strict I/O      SUBJECTIVE:  Seen in HD unit   Doing well   No active complaints     Inpatient Medications:  [Held by provider] amLODIPine (NORVASC) tablet, 10 mg, Oral, Daily  aspirin chewable tablet 81 mg, 81 mg, Oral, Daily  atorvastatin (LIPITOR) tablet, 80 mg, Oral, QPM  bacitracin 500 units/gram topical ointment packet, 1 Packet, Apply Topically, 2x/day PRN  carvedilol (COREG) tablet, 25 mg, Oral, 2x/day-Food  collagenase (SANTYL) 250 unit/gm ointment, , Apply Topically, Daily  [Held by provider] cyclobenzaprine (FLEXERIL) tablet, 5 mg, Oral, Q8H PRN  D5W 250 mL flush bag, , Intravenous, Q15 Min PRN  DULoxetine (CYMBALTA) delayed release capsule, 60 mg, Oral, Daily  ezetimibe (ZETIA) tablet, 10 mg, Oral, QPM  ferrous sulfate 324  mg (65 mg elemental IRON) tablet, 324 mg, Oral, Daily before Breakfast  gabapentin (NEURONTIN) capsule, 400 mg, Oral, 3x/day  insulin glargine-yfgn 100 units/mL injection, 8 Units, Subcutaneous, Daily  isosorbide mononitrate (IMDUR) 24 hr extended release tablet, 60 mg, Oral, Daily  lidocaine-menthol (LIDOPATCH) 3.6%-1.25% patch, 1 Patch, Transdermal, Daily  [Held by provider] lisinopril (PRINIVIL) tablet, 5 mg, Oral, Daily  NS 250 mL flush bag, , Intravenous, Q15 Min PRN  NS flush syringe, 2-6 mL, Intracatheter, Q8HRS  NS flush syringe, 2-6 mL, Intracatheter, Q1 MIN PRN  ondansetron (ZOFRAN) 2 mg/mL injection, 4 mg, Intravenous, Q8H PRN  oxyCODONE (ROXICODONE) immediate release tablet, 5 mg, Oral, Q6H PRN  pantoprazole (PROTONIX) delayed release tablet, 40 mg, Oral, Daily before Breakfast  polyethylene glycol (MIRALAX) oral packet, 17 g, Oral, Daily  premixed hemodialysate (NATURALYTE) 3.43 L with potassium chloride 2 mEq/L, calcium chloride 2.5 mEq/L, , Hemodialysis, Give in Dialysis  ranolazine (RANEXA) extended release tablet, 500 mg, Oral, 2x/day  sennosides-docusate sodium (SENOKOT-S) 8.6-50mg  per tablet, 1 Tablet, Oral, 2x/day  sevelamer carbonate (RENVELA) tablet, 800 mg, Oral, 3x/day-Meals  sodium citrate 4% injection syringe, 2 Syringe, Intracatheter, Give in Dialysis  SSIP insulin lispro 100 units/mL injection, 0-12 Units, Subcutaneous, Q4H PRN  torsemide (DEMADEX) tablet, 20 mg, Oral, 2x/day  warfarin (COUMADIN) tablet 3 mg, 3 mg, Oral, NIGHTLY  Warfarin - Pharmacist to Dose per Protocol, , Does not apply, Daily PRN      Objective:  Filed Vitals:    03/08/21 1900 03/08/21 2329 03/09/21 0258 03/09/21 0820   BP: 124/69 135/76 118/71 123/76   Pulse: 82 79 77 80   Resp: 16 16 17     Temp: 36.8 C (98.3 F) 36.7 C (98.1 F) 36.7 C (98 F)    SpO2: 90% 98% 92%      General :on 3L NC (baseline)  Lungs: crackles+   WCB:JSEGBTD   Access: rt TCC   Rt BKA     I&O:  02/27 0700 - 02/28 0659  In: 750 [P.O.:650]  Out:  3200 [Urine:200; Dialysis:3000]     Labs:  Recent Labs     03/07/21  0324 03/08/21  0426 03/09/21  0425   SODIUM 130* 133* 132*   POTASSIUM 5.7* 6.0* 4.9   CHLORIDE 100 102 100   CO2 22 22 25    BUN 48* 61* 39*   CREATININE 3.38* 3.72* 2.77*   GFR 22* 19* 27*   ANIONGAP 8 9 7      Recent Labs     03/07/21  0324 03/08/21  0426 03/09/21  0425   CALCIUM 8.8 8.7 8.6   MAGNESIUM 1.6* 1.5* 1.9   PHOSPHORUS 4.1 4.7 3.6     Recent Labs     03/07/21  0324 03/08/21  0426 03/09/21  0425   WBC 12.4* 12.7* 11.1*   HGB 7.1* 7.5* 7.0*   HCT 23.3* 24.8* 22.6*   PLTCNT 464* 454* 490*     No results found for this encounter    Thank you for the consult.  Will continue to follow.   Please call with any questions.    Lynwood Dawley, MD 03/09/2021  Nephrology Fellow, PGY-V   Nephrology Consult Team [facility]      I saw and examined the patient.  I reviewed the fellow's note.  I agree with the findings and plan of care as documented in the fellow's note.  Any exceptions/additions are edited/noted.    Clydia Llano, MD

## 2021-03-09 NOTE — Care Management Notes (Signed)
Referral Information  ++++++ Placed Provider #1 ++++++  Case Manager: Claudius Sis  Provider Type: Nursing Home/SNF-Return  Provider Name: [facility]  Address:  [address]  [place], [place] [address]  Contact: Rudolpho Sevin Days    Phone: [phone] x  Fax:   Fax: [phone]

## 2021-03-09 NOTE — Nurses Notes (Addendum)
Patient ordered discharge to rehab. IV lines removed and tip intact. Reviewed hospital course, diet, activity, wound care, medications and follow-up appointment with the patient. No questions or concerns at this time. Patient taken by EMS from unit for discharge. Report called to [facility]. All questions answered.

## 2021-03-09 NOTE — Nurses Notes (Signed)
Pt. Completed HD treatment. Tolerated well. VSS. Total UF removed 2L.

## 2021-03-10 NOTE — Transitional Care (Signed)
Hospital discharge follow up deferred as patient went to a facility.  Lesly Dukes, LPN  04/13/8379, 84:03

## 2021-03-16 ENCOUNTER — Other Ambulatory Visit (INDEPENDENT_AMBULATORY_CARE_PROVIDER_SITE_OTHER): Payer: Self-pay | Admitting: Nephrology

## 2021-03-16 ENCOUNTER — Other Ambulatory Visit (HOSPITAL_COMMUNITY): Payer: Self-pay | Admitting: Nephrology

## 2021-03-16 DIAGNOSIS — N186 End stage renal disease: Secondary | ICD-10-CM

## 2021-03-16 DIAGNOSIS — Z4901 Encounter for fitting and adjustment of extracorporeal dialysis catheter: Secondary | ICD-10-CM

## 2021-03-17 ENCOUNTER — Other Ambulatory Visit: Payer: Medicare (Managed Care) | Attending: Internal Medicine | Admitting: Internal Medicine

## 2021-03-17 ENCOUNTER — Other Ambulatory Visit (HOSPITAL_COMMUNITY): Payer: Self-pay | Admitting: Internal Medicine

## 2021-03-17 DIAGNOSIS — G9341 Metabolic encephalopathy: Secondary | ICD-10-CM | POA: Insufficient documentation

## 2021-03-17 LAB — BASIC METABOLIC PANEL
ANION GAP: 6 mmol/L (ref 4–13)
BUN/CREA RATIO: 5 — ABNORMAL LOW (ref 6–22)
BUN: 14 mg/dL (ref 8–25)
CALCIUM: 8.9 mg/dL (ref 8.5–10.0)
CHLORIDE: 98 mmol/L (ref 96–111)
CO2 TOTAL: 29 mmol/L (ref 22–30)
CREATININE: 2.64 mg/dL — ABNORMAL HIGH (ref 0.75–1.35)
ESTIMATED GFR: 29 mL/min/BSA — ABNORMAL LOW (ref >=60)
GLUCOSE: 168 mg/dL — ABNORMAL HIGH (ref 65–125)
POTASSIUM: 3.5 mmol/L (ref 3.5–5.1)
SODIUM: 133 mmol/L — ABNORMAL LOW (ref 136–145)

## 2021-03-19 ENCOUNTER — Other Ambulatory Visit: Payer: Self-pay

## 2021-03-19 ENCOUNTER — Other Ambulatory Visit (HOSPITAL_COMMUNITY): Payer: Self-pay | Admitting: Internal Medicine

## 2021-03-19 ENCOUNTER — Other Ambulatory Visit: Payer: Medicare (Managed Care) | Attending: Internal Medicine | Admitting: Internal Medicine

## 2021-03-19 DIAGNOSIS — E119 Type 2 diabetes mellitus without complications: Secondary | ICD-10-CM | POA: Insufficient documentation

## 2021-03-19 DIAGNOSIS — G9341 Metabolic encephalopathy: Secondary | ICD-10-CM | POA: Insufficient documentation

## 2021-03-19 LAB — BASIC METABOLIC PANEL
ANION GAP: 11 mmol/L (ref 4–13)
BUN/CREA RATIO: 5 — ABNORMAL LOW (ref 6–22)
BUN: 11 mg/dL (ref 8–25)
CALCIUM: 8.7 mg/dL (ref 8.5–10.0)
CHLORIDE: 97 mmol/L (ref 96–111)
CO2 TOTAL: 27 mmol/L (ref 22–30)
CREATININE: 2.29 mg/dL — ABNORMAL HIGH (ref 0.75–1.35)
ESTIMATED GFR: 34 mL/min/BSA — ABNORMAL LOW (ref >=60)
GLUCOSE: 201 mg/dL — ABNORMAL HIGH (ref 65–125)
POTASSIUM: 2.9 mmol/L — ABNORMAL LOW (ref 3.5–5.1)
SODIUM: 135 mmol/L — ABNORMAL LOW (ref 136–145)

## 2021-03-19 LAB — PT/INR
INR: 3.48 (ref 0.00–3.50)
PROTHROMBIN TIME: 32.1 seconds — ABNORMAL HIGH (ref 10.0–11.0)

## 2021-03-22 ENCOUNTER — Other Ambulatory Visit: Payer: Medicare (Managed Care) | Attending: Internal Medicine | Admitting: Internal Medicine

## 2021-03-22 ENCOUNTER — Other Ambulatory Visit (HOSPITAL_COMMUNITY): Payer: Self-pay | Admitting: Internal Medicine

## 2021-03-22 DIAGNOSIS — N186 End stage renal disease: Secondary | ICD-10-CM | POA: Insufficient documentation

## 2021-03-22 DIAGNOSIS — I513 Intracardiac thrombosis, not elsewhere classified: Secondary | ICD-10-CM | POA: Insufficient documentation

## 2021-03-22 LAB — BASIC METABOLIC PANEL
ANION GAP: 9 mmol/L (ref 4–13)
BUN/CREA RATIO: 4 — ABNORMAL LOW (ref 6–22)
BUN: 12 mg/dL (ref 8–25)
CALCIUM: 9.1 mg/dL (ref 8.5–10.0)
CHLORIDE: 96 mmol/L (ref 96–111)
CO2 TOTAL: 29 mmol/L (ref 22–30)
CREATININE: 2.69 mg/dL — ABNORMAL HIGH (ref 0.75–1.35)
ESTIMATED GFR: 28 mL/min/BSA — ABNORMAL LOW (ref >=60)
GLUCOSE: 133 mg/dL — ABNORMAL HIGH (ref 65–125)
POTASSIUM: 3.1 mmol/L — ABNORMAL LOW (ref 3.5–5.1)
SODIUM: 134 mmol/L — ABNORMAL LOW (ref 136–145)

## 2021-03-22 LAB — PT/INR
INR: 4.04 — ABNORMAL HIGH (ref 0.00–3.50)
PROTHROMBIN TIME: 36.9 seconds — ABNORMAL HIGH (ref 10.0–11.0)

## 2021-03-24 ENCOUNTER — Other Ambulatory Visit: Payer: Medicare (Managed Care) | Attending: Internal Medicine | Admitting: Internal Medicine

## 2021-03-24 ENCOUNTER — Other Ambulatory Visit (HOSPITAL_COMMUNITY): Payer: Self-pay | Admitting: Internal Medicine

## 2021-03-24 DIAGNOSIS — Z7901 Long term (current) use of anticoagulants: Secondary | ICD-10-CM | POA: Insufficient documentation

## 2021-03-24 LAB — PT/INR
INR: 3.7 — ABNORMAL HIGH (ref 0.00–3.50)
PROTHROMBIN TIME: 34 seconds — ABNORMAL HIGH (ref 10.0–11.0)

## 2021-03-28 ENCOUNTER — Emergency Department
Admission: EM | Admit: 2021-03-28 | Discharge: 2021-03-29 | Discharge status: Against Medical Advice | Payer: Medicare (Managed Care) | Source: Ambulatory Visit | Attending: Student in an Organized Health Care Education/Training Program | Admitting: Student in an Organized Health Care Education/Training Program

## 2021-03-28 ENCOUNTER — Encounter (HOSPITAL_COMMUNITY): Payer: Self-pay

## 2021-03-28 ENCOUNTER — Other Ambulatory Visit: Payer: Self-pay

## 2021-03-28 ENCOUNTER — Emergency Department (HOSPITAL_COMMUNITY): Payer: Medicare (Managed Care)

## 2021-03-28 DIAGNOSIS — Z992 Dependence on renal dialysis: Secondary | ICD-10-CM

## 2021-03-28 DIAGNOSIS — Z89511 Acquired absence of right leg below knee: Secondary | ICD-10-CM | POA: Insufficient documentation

## 2021-03-28 DIAGNOSIS — I25110 Atherosclerotic heart disease of native coronary artery with unstable angina pectoris: Secondary | ICD-10-CM | POA: Insufficient documentation

## 2021-03-28 DIAGNOSIS — D649 Anemia, unspecified: Secondary | ICD-10-CM | POA: Insufficient documentation

## 2021-03-28 DIAGNOSIS — I4510 Unspecified right bundle-branch block: Secondary | ICD-10-CM

## 2021-03-28 DIAGNOSIS — Z7982 Long term (current) use of aspirin: Secondary | ICD-10-CM

## 2021-03-28 DIAGNOSIS — Z20822 Contact with and (suspected) exposure to covid-19: Secondary | ICD-10-CM | POA: Insufficient documentation

## 2021-03-28 DIAGNOSIS — Z7901 Long term (current) use of anticoagulants: Secondary | ICD-10-CM | POA: Insufficient documentation

## 2021-03-28 DIAGNOSIS — I252 Old myocardial infarction: Secondary | ICD-10-CM | POA: Insufficient documentation

## 2021-03-28 DIAGNOSIS — I255 Ischemic cardiomyopathy: Secondary | ICD-10-CM

## 2021-03-28 DIAGNOSIS — I259 Chronic ischemic heart disease, unspecified: Secondary | ICD-10-CM

## 2021-03-28 DIAGNOSIS — Z955 Presence of coronary angioplasty implant and graft: Secondary | ICD-10-CM

## 2021-03-28 DIAGNOSIS — I200 Unstable angina: Secondary | ICD-10-CM

## 2021-03-28 DIAGNOSIS — Z5329 Procedure and treatment not carried out because of patient's decision for other reasons: Secondary | ICD-10-CM | POA: Insufficient documentation

## 2021-03-28 DIAGNOSIS — E119 Type 2 diabetes mellitus without complications: Secondary | ICD-10-CM | POA: Insufficient documentation

## 2021-03-28 LAB — BASIC METABOLIC PANEL
ANION GAP: 11 mmol/L (ref 4–13)
BUN/CREA RATIO: 6 (ref 6–22)
BUN: 36 mg/dL — ABNORMAL HIGH (ref 8–25)
CALCIUM: 9.5 mg/dL (ref 8.5–10.0)
CHLORIDE: 98 mmol/L (ref 96–111)
CO2 TOTAL: 26 mmol/L (ref 22–30)
CREATININE: 5.74 mg/dL — ABNORMAL HIGH (ref 0.75–1.35)
ESTIMATED GFR: 11 mL/min/BSA — ABNORMAL LOW (ref >=60)
GLUCOSE: 137 mg/dL — ABNORMAL HIGH (ref 65–125)
POTASSIUM: 4.1 mmol/L (ref 3.5–5.1)
SODIUM: 135 mmol/L — ABNORMAL LOW (ref 136–145)

## 2021-03-28 LAB — HEPATIC FUNCTION PANEL
ALBUMIN: 2.6 g/dL — ABNORMAL LOW (ref 3.5–5.0)
ALKALINE PHOSPHATASE: 281 U/L — ABNORMAL HIGH (ref 45–115)
ALT (SGPT): 5 U/L — ABNORMAL LOW (ref 10–55)
AST (SGOT): 12 U/L (ref 8–45)
BILIRUBIN DIRECT: 0.3 mg/dL (ref 0.1–0.4)
BILIRUBIN TOTAL: 0.6 mg/dL (ref 0.3–1.3)
PROTEIN TOTAL: 9.2 g/dL — ABNORMAL HIGH (ref 6.4–8.3)

## 2021-03-28 LAB — CBC WITH DIFF
BASOPHIL %: 1 % (ref 0–3)
EOSINOPHIL %: 3 % (ref 0–7)
HCT: 23.8 % — ABNORMAL LOW (ref 43.5–53.7)
HGB & HCT check: -0.4
HGB: 7.8 g/dL — ABNORMAL LOW (ref 14.1–18.1)
LYMPHOCYTE %: 17 % (ref 10–50)
MCH: 27.6 pg (ref 27.0–31.2)
MCHC: 32.9 g/dL (ref 31.8–35.4)
MCV: 83.8 fL (ref 80.0–97.0)
MONOCYTE %: 18 % — ABNORMAL HIGH (ref 0–12)
NEUTROPHIL #: 7.4 10*3/uL
NEUTROPHIL %: 61 % (ref 37–80)
PLATELETS: 506 10*3/uL — ABNORMAL HIGH (ref 142–424)
RBC: 2.84 10*6/uL — ABNORMAL LOW (ref 4.69–6.13)
RDW: 15.6 % — ABNORMAL HIGH (ref 11.6–14.9)
WBC: 12.3 10*3/uL — ABNORMAL HIGH (ref 4.6–10.2)

## 2021-03-28 LAB — TROPONIN-I: TROPONIN I: 33 ng/L (ref <=30)

## 2021-03-28 LAB — CREATINE KINASE (CK), TOTAL, SERUM: CREATINE KINASE: 29 U/L — ABNORMAL LOW (ref 45–225)

## 2021-03-28 LAB — PT/INR
INR: 2.39 (ref 0.00–3.50)
PROTHROMBIN TIME: 22.6 seconds — ABNORMAL HIGH (ref 10.0–11.0)

## 2021-03-28 LAB — B-TYPE NATRIURETIC PEPTIDE: BNP: 900 pg/mL — ABNORMAL HIGH (ref <=99)

## 2021-03-28 LAB — PTT (PARTIAL THROMBOPLASTIN TIME): APTT: 49.2 seconds (ref 25.0–30.0)

## 2021-03-28 MED ORDER — NITROGLYCERIN 25 MG/250 ML (100 MCG/ML) IN 5 % DEXTROSE  INTRAVENOUS
5.0000 ug/min | INTRAVENOUS | Status: DC
Start: 2021-03-28 — End: 2021-03-29
  Administered 2021-03-28: 5 ug/min via INTRAVENOUS
  Administered 2021-03-29: 0 ug/min via INTRAVENOUS
  Filled 2021-03-28: qty 250

## 2021-03-28 MED ORDER — FENTANYL (PF) 50 MCG/ML INJECTION SOLUTION
50.0000 ug | INTRAMUSCULAR | Status: AC
Start: 2021-03-28 — End: 2021-03-28
  Administered 2021-03-28: 50 ug via INTRAVENOUS
  Filled 2021-03-28: qty 2

## 2021-03-28 MED ORDER — ASPIRIN 81 MG CHEWABLE TABLET
324.0000 mg | CHEWABLE_TABLET | ORAL | Status: AC
Start: 2021-03-28 — End: 2021-03-28
  Administered 2021-03-28: 324 mg via ORAL
  Filled 2021-03-28: qty 4

## 2021-03-28 MED ORDER — MORPHINE 4 MG/ML INTRAVENOUS SOLUTION
4.0000 mg | INTRAVENOUS | Status: DC
Start: 2021-03-28 — End: 2021-03-28

## 2021-03-28 MED ORDER — ONDANSETRON HCL (PF) 4 MG/2 ML INJECTION SOLUTION
4.0000 mg | INTRAMUSCULAR | Status: AC
Start: 2021-03-28 — End: 2021-03-28
  Administered 2021-03-28: 4 mg via INTRAVENOUS
  Filled 2021-03-28: qty 2

## 2021-03-28 NOTE — ED Triage Notes (Signed)
Pt c/o chest pain that began while he was sitting watching TV at approx. Pt reports pain is left sided radiating to left arm.

## 2021-03-28 NOTE — ED Nurses Note (Signed)
Pt states he wears 4.5-5L O2 via NC at home.

## 2021-03-28 NOTE — ED Provider Notes (Signed)
Emergency Department  Provider Note    Name: Max [name].  Age and Gender: 51 y.o. male  PCP: Azzie Roup, MD        Triage Note:   Chest Pain           Subjective    HPI:  Max [name]. is a 51 y.o. male  who presents to the ED today for persistent severe retrosternal chest pain radiating to the left arm started while patient was watching television, patient goes to dialysis Monday Wednesday and Friday with most recent dialysis this past Friday and due for dialysis tomorrow Monday.  Patient has history of ischemic heart disease most recent echo showed EF of 55% based on TTE performed approximately 1 month ago when patient was transferred to [facility] with hyperkalemia hypotension sepsis and pneumonia.  Patient also has extensive history of CAD PCI times 10 reported by history, his Brilinta was discontinued during his stay at [facility] and it is unclear whether patient has been receiving his 81 mg aspirin together with his warfarin as patient does not exactly know what he is currently taking.  Patient is currently residing at a skilled nursing facility he has history of type 2 diabetes with complications previous osteomyelitis and right below-knee amputation.  Patient states current chest pain is similar to previous heart attacks.      Review of Systems: Other than those specifically stated in the HPI and below, all other systems reviewed and negative.      Below information reviewed with patient where pertinent:  Past Medical History:   Diagnosis Date   . Arthropathy    . CKD (chronic kidney disease), stage III (CMS HCC) 08/25/2019   . Congestive heart failure (CMS HCC)    . Coronary artery disease    . Diabetes mellitus, type 2 (CMS HCC)    . Headache    . HTN (hypertension)        Current Outpatient Medications   Medication Sig   . amLODIPine (NORVASC) 10 mg Oral Tablet Take 1 Tablet (10 mg total) by mouth Once a day   . aspirin (ECOTRIN) 81 mg Oral Tablet, Delayed Release (E.C.) Take 1  Tablet (81 mg total) by mouth Once a day   . atorvastatin (LIPITOR) 80 mg Oral Tablet Take 1 Tablet (80 mg total) by mouth Every evening   . bacitracin 500 unit/gram Packet Apply 1 Packet topically Twice per day as needed for Other   . carvediloL (COREG) 25 mg Oral Tablet Take 1 Tablet (25 mg total) by mouth Twice daily with food   . cyclobenzaprine (FLEXERIL) 5 mg Oral Tablet Take 1 Tablet (5 mg total) by mouth Three times a day as needed for Muscle spasms   . docusate sodium (COLACE) 100 mg Oral Capsule Take 1 Capsule (100 mg total) by mouth Twice daily   . DULoxetine (CYMBALTA DR) 60 mg Oral Capsule, Delayed Release(E.C.) Take 1 Capsule (60 mg total) by mouth Once a day   . esomeprazole magnesium (NEXIUM) 20 mg Oral Capsule, Delayed Release(E.C.) Take 1 Capsule (20 mg total) by mouth Every [name] before breakfast   . ezetimibe (ZETIA) 10 mg Oral Tablet Take 1 Tablet (10 mg total) by mouth Every evening   . ferrous sulfate (FERATAB) 324 mg (65 mg iron) Oral Tablet, Delayed Release (E.C.) Take 1 Tablet (324 mg total) by mouth   . gabapentin (NEURONTIN) 400 mg Oral Capsule Take 1 Capsule (400 mg total) by mouth Three times a  day   . insulin glargine-yfgn 100 unit/mL Subcutaneous Inject 8 Units under the skin Once a day   . insulin lispro 100 units/mL Subcutaneous Injectable Inject 0-12 Units under the skin Every 4 hours as needed for Other   . isosorbide mononitrate (IMDUR) 60 mg Oral Tablet Sustained Release 24 hr Take 1 Tablet (60 mg total) by mouth Every [name]   . lidocaine (LIDODERM) 5 % Adhesive Patch, Medicated Place 1 Patch (700 mg total) on the skin Once a day   . oxyCODONE (ROXICODONE) 5 mg Oral Tablet Take 1 Tablet (5 mg total) by mouth Every 6 hours as needed   . polyethylene glycol (MIRALAX) 17 gram Oral Powder in Packet Take 1 Packet (17 g total) by mouth Once a day   . ranolazine (RANEXA) 500 mg Oral Tablet Sustained Release 12 hr Take 1 Tablet (500 mg total) by mouth Twice daily   . sevelamer  (RENAGEL) 800 mg Oral Tablet Take 1 Tablet (800 mg total) by mouth Three times daily with meals   . sodium zirconium cyclosilicate (LOKELMA) 10 gram Oral Powder in Packet Take 1 Packet (10 g total) by mouth Once a day   . sucralfate (CARAFATE) 1 gram Oral Tablet Take 1 Tablet (1 g total) by mouth Three times daily before meals   . torsemide (DEMADEX) 20 mg Oral Tablet Take 1 Tablet (20 mg total) by mouth Twice daily   . Warfarin (COUMADIN) 3 mg Oral Tablet Take 1 Tablet (3 mg total) by mouth Once a day       Allergies   Allergen Reactions   . Ultram [Tramadol] Rash   . Benadryl [Diphenhydramine]  Other Adverse Reaction (Add comment)     irritable   . Penicillin G Benethamine        Past Surgical History:   Procedure Laterality Date   . AMPUTATION FOOT / TOE Left    . CORONARY ARTERY ANGIOPLASTY     . HX CATARACT REMOVAL Bilateral        Social History     Tobacco Use   . Smoking status: Never   . Smokeless tobacco: Never   Vaping Use   . Vaping Use: Never used   Substance Use Topics   . Alcohol use: Never   . Drug use: Never           Objective  Nursing notes reviewed    ED Triage Vitals   BP (Non-Invasive) 03/28/21 2219 105/73   Heart Rate 03/28/21 2219 72   Respiratory Rate 03/28/21 2219 20   Temperature 03/28/21 2219 36.1 C (97 F)   SpO2 03/28/21 2219 95 %   Weight 03/28/21 2216 79.4 kg (175 lb)   Height 03/28/21 2216 1.727 m (5\' 8" )     Filed Vitals:    03/28/21 2300 03/28/21 2345 03/29/21 0130 03/29/21 0230   BP: 100/79 97/68 103/66 100/72   Pulse: 72 72 69 68   Resp: 17 14 (!) 9 16   Temp:       SpO2: 100% 97%         Physical Exam  . Vital signs Reviewed.   . Constitutional: NAD.   Marland Kitchen HENT:   o Head: Normocephalic and atraumatic   o Mouth/Throat: Oropharynx is clear and moist   o Eyes: EOMI, conjunctivae without discharge bilaterally  o Neck: Trachea midline   . Cardiovascular: RRR, No murmurs, rubs or gallops   . Pulmonary/Chest: BS equal bilaterally, good air movement. No respiratory distress. No  wheezes,  rales or chest tenderness   . Abdominal: BS +. Abdomen soft, no tenderness, rebound or guarding  . Musculoskeletal:  Right BKA  . Skin: warm and dry. No rash, erythema, pallor or cyanosis  . Psychiatric: normal mood and affect. Behavior is normal   . Neurological: Alert&Ox3. Grossly intact      Labs:   Results for orders placed or performed during the hospital encounter of 03/28/21 (from the past 24 hour(s))   BASIC METABOLIC PANEL   Result Value Ref Range    SODIUM 135 (L) 136 - 145 mmol/L    POTASSIUM 4.1 3.5 - 5.1 mmol/L    CHLORIDE 98 96 - 111 mmol/L    CO2 TOTAL 26 22 - 30 mmol/L    ANION GAP 11 4 - 13 mmol/L    CALCIUM 9.5 8.5 - 10.0 mg/dL    GLUCOSE 137 (H) 65 - 125 mg/dL    BUN 36 (H) 8 - 25 mg/dL    CREATININE 5.74 (H) 0.75 - 1.35 mg/dL    BUN/CREA RATIO 6 6 - 22    ESTIMATED GFR 11 (L) >=60 mL/min/BSA   CBC/DIFF    Narrative    The following orders were created for panel order CBC/DIFF.  Procedure                               Abnormality         Status                     ---------                               -----------         ------                     CBC WITH AXKP[[id number]]                Abnormal            Final result                 Please view results for these tests on the individual orders.   CREATINE KINASE (CK), TOTAL, SERUM   Result Value Ref Range    CREATINE KINASE 29 (L) 45 - 225 U/L   HEPATIC FUNCTION PANEL   Result Value Ref Range    ALBUMIN 2.6 (L) 3.5 - 5.0 g/dL     ALKALINE PHOSPHATASE 281 (H) 45 - 115 U/L    ALT (SGPT) 5 (L) 10 - 55 U/L    AST (SGOT)  12 8 - 45 U/L    BILIRUBIN TOTAL 0.6 0.3 - 1.3 mg/dL    BILIRUBIN DIRECT 0.3 0.1 - 0.4 mg/dL    PROTEIN TOTAL 9.2 (H) 6.4 - 8.3 g/dL   TROPONIN-I   Result Value Ref Range    TROPONIN I 33 (HH) <=30 ng/L   PT/INR   Result Value Ref Range    PROTHROMBIN TIME 22.6 (H) 10.0 - 11.0 seconds    INR 2.39 0.00 - 3.50   B-TYPE NATRIURETIC PEPTIDE   Result Value Ref Range    BNP 900 (H) <=99 pg/mL   CBC WITH DIFF   Result Value Ref Range     WBC 12.3 (H) 4.6 - 10.2 x10^3/uL    RBC 2.84 (L) 4.69 -  6.13 x10^6/uL    HGB 7.8 (L) 14.1 - 18.1 g/dL    HCT 23.8 (L) 43.5 - 53.7 %    MCV 83.8 80.0 - 97.0 fL    MCH 27.6 27.0 - 31.2 pg    MCHC 32.9 31.8 - 35.4 g/dL    RDW 15.6 (H) 11.6 - 14.9 %    PLATELETS 506 (H) 142 - 424 x10^3/uL    NEUTROPHIL % 61 37 - 80 %    LYMPHOCYTE % 17 10 - 50 %    MONOCYTE % 18 (H) 0 - 12 %    EOSINOPHIL % 3 0 - 7 %    BASOPHIL % 1 0 - 3 %    HGB & HCT check -0.4     NEUTROPHIL # 7.40 x10^3/uL   PTT (PARTIAL THROMBOPLASTIN TIME)   Result Value Ref Range    APTT 49.2 (HH) 25.0 - 30.0 seconds   TROPONIN-I   Result Value Ref Range    TROPONIN I 29 <=30 ng/L   URINALYSIS WITH REFLEX MICROSCOPIC AND CULTURE IF POSITIVE    Specimen: Urine, Clean Catch    Narrative    The following orders were created for panel order URINALYSIS WITH REFLEX MICROSCOPIC AND CULTURE IF POSITIVE.  Procedure                               Abnormality         Status                     ---------                               -----------         ------                     URINALYSIS, MACRO/MICRO[[id number]]                                                       Please view results for these tests on the individual orders.   COVID-19, FLU A/B, RSV RAPID BY PCR   Result Value Ref Range    SARS-CoV-2 Not Detected Not Detected    INFLUENZA VIRUS TYPE A Not Detected Not Detected    INFLUENZA VIRUS TYPE B Not Detected Not Detected    RESPIRATORY SYNCTIAL VIRUS (RSV) Not Detected Not Detected    Narrative    Results are for the simultaneous qualitative identification of SARS-CoV-2 (formerly 2019-nCoV), Influenza A, Influenza B, and RSV RNA. These etiologic agents are generally detectable in nasopharyngeal and nasal swabs during the ACUTE PHASE of infection. Hence, this test is intended to be performed on respiratory specimens collected from individuals with signs and symptoms of upper respiratory tract infection who meet Centers for Disease Control and Prevention (CDC)  clinical and/or epidemiological criteria for Coronavirus Disease 2019 (COVID-19) testing. CDC COVID-19 criteria for testing on human specimens is available at [facility] webpage information for Healthcare Professionals: Coronavirus Disease 2019 (COVID-19) ([employer]).     False-negative results may occur if the virus has genomic mutations, insertions, deletions, or rearrangements or if performed very early in the course of illness. Otherwise, negative results indicate virus  specific RNA targets are not detected, however negative results do not preclude SARS-CoV-2 infection/COVID-19, Influenza, or Respiratory syncytial virus infection. Results should not be used as the sole basis for patient management decisions. Negative results must be combined with clinical observations, patient history, and epidemiological information. If upper respiratory tract infection is still suspected based on exposure history together with other clinical findings, re-testing should be considered.    Disclaimer:   This assay has been authorized by FDA under an Emergency Use Authorization for use in laboratories certified under the Clinical Laboratory Improvement Amendments of 1988 (CLIA), 42 U.S.C. [phone], to perform high complexity tests. The impacts of vaccines, antiviral therapeutics, antibiotics, chemotherapeutic or immunosuppressant drugs have not been evaluated.     Test methodology:   Cepheid Xpert Xpress SARS-CoV-2/Flu/RSV Assay real-time polymerase chain reaction (RT-PCR) test on the GeneXpert Dx and Xpert Xpress systems.       Imaging:  Results for orders placed or performed during the hospital encounter of 03/28/21 (from the past 24 hour(s))   XR AP MOBILE CHEST     Status: None    Narrative    Max [name].    RADIOLOGIST: Betsey Amen, MD    XR AP MOBILE CHEST performed on 03/28/2021 11:02 PM    CLINICAL HISTORY: chest pain, dialysis.  Chest Pain     TECHNIQUE: Frontal view of  the chest.    COMPARISON:  2123    FINDINGS:  Right IJ dialysis catheter in place whose tip is in the SVC.      Heart size is mildly enlarged.     Mild pulmonary vascular congestion. No pneumothorax. No large effusion.           Impression    As above      Radiologist location ID: [place] and imaging reviewed.    EKG shows normal sinus rhythm possible left atrial enlargement was right bundle-branch block and inferior lateral ischemic changes unchanged from most recent EKG on 03/07/2021.    Assessment/Plan    Orders:  Orders Placed This Encounter   . XR AP MOBILE CHEST   . BASIC METABOLIC PANEL   . CBC/DIFF   . CREATINE KINASE (CK), TOTAL, SERUM   . HEPATIC FUNCTION PANEL   . TROPONIN-I   . PT/INR   . B-TYPE NATRIURETIC PEPTIDE   . CBC WITH DIFF   . PTT (PARTIAL THROMBOPLASTIN TIME)   . TROPONIN-I   . URINALYSIS WITH REFLEX MICROSCOPIC AND CULTURE IF POSITIVE   . URINALYSIS, MACRO/MICRO   . COVID-19, FLU A/B, RSV RAPID BY PCR   . OXYGEN - NASAL CANNULA   . ECG 12 LEAD   . nitroGLYCERIN 25 mg in 250 mL D5W premix infusion   . aspirin chewable tablet 324 mg   . ondansetron (ZOFRAN) 2 mg/mL injection   . fentaNYL (SUBLIMAZE) 50 mcg/mL injection   . SSIP insulin lispro (HUMALOG) 100 units/mL injection   . oxyCODONE (ROXICODONE) immediate release tablet   . aspirin chewable tablet 324 mg   . atorvastatin (LIPITOR) tablet   . carvedilol (COREG) tablet   . pantoprazole (PROTONIX) delayed release tablet   . ezetimibe (ZETIA) tablet   . sodium zirconium cyclosilicate (LOKELMA) powder   . torsemide (DEMADEX) tablet   . sucralfate (CARAFATE) tablet   . insulin glargine-yfgn 100 units/mL injection       Clinical Impression:     Clinical Impression   Unstable angina (CMS  [place]) (Primary)   Dialysis patient (CMS [place])   Ischemic cardiomyopathy   Chronic anemia         Course and MDM:  Patient seen and examined.  Max [name]. is a 51 y.o. male who presented with an episode of severe chest pain radiating to the  left arm similar to previous angina pain EKG is unchanged but chronically abnormal with possibility inferior lateral ischemia, known history of CAD with prior PCI times 10 by record review, ischemic cardiomyopathy, currently on warfarin with a therapeutic INR, chronic anemia, dialysis patient, treated with IV nitro and aspirin as well as fentanyl for pain, discussed with Cardiology Dr. Larey Seat who agreed patient can be transferred to the medical service at [facility], patient accepted by hospitalist Dr. Elta Guadeloupe and currently awaiting bed placement.              Critical Care: Critical Care Time:  Total critical care time spent in direct care of this patient at high risk based on presenting history/exam/and complaint, including initial evaluation and stabilization, review of data, re-examination, discussion with admitting and consulting services to arrange definitive care, and exclusive of any procedures performed, was 45 minutes.    Signed out to Dr. August Albino at 7 AM      Disposition: Transfered to Another Facility        Follow up:   No follow-up provider specified.              Parts of this patient's chart were completed in a retrospective fashion due to simultaneous direct patient care activities in the Emergency Department.

## 2021-03-29 ENCOUNTER — Telehealth (INDEPENDENT_AMBULATORY_CARE_PROVIDER_SITE_OTHER): Payer: Self-pay | Admitting: Surgery

## 2021-03-29 ENCOUNTER — Emergency Department (HOSPITAL_COMMUNITY)
Admission: AD | Admit: 2021-03-29 | Payer: Self-pay | Source: Other Acute Inpatient Hospital | Admitting: Family Medicine

## 2021-03-29 ENCOUNTER — Emergency Department (HOSPITAL_COMMUNITY): Admission: AD | Admit: 2021-03-29 | Payer: Self-pay | Source: Other Acute Inpatient Hospital

## 2021-03-29 ENCOUNTER — Inpatient Hospital Stay (HOSPITAL_COMMUNITY)
Admission: AD | Admit: 2021-03-29 | Payer: Self-pay | Source: Other Acute Inpatient Hospital | Admitting: Student in an Organized Health Care Education/Training Program

## 2021-03-29 LAB — COVID-19, FLU A/B, RSV RAPID BY PCR
INFLUENZA VIRUS TYPE A: NOT DETECTED
INFLUENZA VIRUS TYPE B: NOT DETECTED
RESPIRATORY SYNCTIAL VIRUS (RSV): NOT DETECTED
SARS-CoV-2: NOT DETECTED

## 2021-03-29 LAB — TROPONIN-I: TROPONIN I: 29 ng/L (ref <=30)

## 2021-03-29 MED ORDER — PANTOPRAZOLE 40 MG TABLET,DELAYED RELEASE
40.0000 mg | DELAYED_RELEASE_TABLET | Freq: Every day | ORAL | Status: DC
Start: 2021-03-29 — End: 2021-03-29
  Administered 2021-03-29: 40 mg via ORAL
  Filled 2021-03-29: qty 1

## 2021-03-29 MED ORDER — SODIUM ZIRCONIUM CYCLOSILICATE 10 GRAM ORAL POWDER PACKET
10.0000 g | Freq: Every day | ORAL | Status: DC
Start: 2021-03-29 — End: 2021-03-29
  Administered 2021-03-29: 0 g via ORAL
  Filled 2021-03-29: qty 1

## 2021-03-29 MED ORDER — ATORVASTATIN 40 MG TABLET
80.0000 mg | ORAL_TABLET | Freq: Every evening | ORAL | Status: DC
Start: 2021-03-29 — End: 2021-03-29
  Administered 2021-03-29: 80 mg via ORAL
  Filled 2021-03-29: qty 2

## 2021-03-29 MED ORDER — TORSEMIDE 10 MG TABLET
20.0000 mg | ORAL_TABLET | Freq: Two times a day (BID) | ORAL | Status: DC
Start: 2021-03-29 — End: 2021-03-29
  Administered 2021-03-29: 20 mg via ORAL
  Filled 2021-03-29: qty 2

## 2021-03-29 MED ORDER — OXYCODONE 5 MG TABLET
5.0000 mg | ORAL_TABLET | Freq: Four times a day (QID) | ORAL | Status: DC | PRN
Start: 2021-03-29 — End: 2021-03-29
  Administered 2021-03-29: 5 mg via ORAL
  Filled 2021-03-29: qty 1

## 2021-03-29 MED ORDER — CARVEDILOL 12.5 MG TABLET
25.0000 mg | ORAL_TABLET | Freq: Two times a day (BID) | ORAL | Status: DC
Start: 2021-03-29 — End: 2021-03-29
  Administered 2021-03-29: 25 mg via ORAL
  Filled 2021-03-29: qty 2

## 2021-03-29 MED ORDER — SUCRALFATE 1 GRAM TABLET
1.0000 g | ORAL_TABLET | Freq: Three times a day (TID) | ORAL | Status: DC
Start: 2021-03-29 — End: 2021-03-29
  Administered 2021-03-29 (×2): 1 g via ORAL
  Filled 2021-03-29 (×2): qty 1

## 2021-03-29 MED ORDER — INSULIN GLARGINE-YFGN (U-100) 100 UNIT/ML SUBQ - CHARGE BY DOSE
8.0000 [IU] | Freq: Every evening | SUBCUTANEOUS | Status: DC
Start: 2021-03-29 — End: 2021-03-29

## 2021-03-29 MED ORDER — INSULIN LISPRO 100 UNIT/ML SUBQ SSIP - WTZ
0.0000 [IU] | SUBCUTANEOUS | Status: DC
Start: 2021-03-29 — End: 2021-03-29

## 2021-03-29 MED ORDER — INSULIN LISPRO 100 UNIT/ML SUBQ SSIP - WTZ
0.0000 [IU] | Freq: Four times a day (QID) | SUBCUTANEOUS | Status: DC | PRN
Start: 2021-03-29 — End: 2021-03-29

## 2021-03-29 MED ORDER — FENTANYL (PF) 50 MCG/ML INJECTION SOLUTION
50.0000 ug | INTRAMUSCULAR | Status: DC | PRN
Start: 2021-03-29 — End: 2021-03-29
  Administered 2021-03-29: 50 ug via INTRAVENOUS
  Filled 2021-03-29: qty 2

## 2021-03-29 MED ORDER — ASPIRIN 81 MG CHEWABLE TABLET
324.0000 mg | CHEWABLE_TABLET | Freq: Every day | ORAL | Status: DC
Start: 2021-03-29 — End: 2021-03-29
  Administered 2021-03-29: 324 mg via ORAL
  Filled 2021-03-29: qty 4

## 2021-03-29 MED ORDER — EZETIMIBE 10 MG TABLET
10.0000 mg | ORAL_TABLET | Freq: Every evening | ORAL | Status: DC
Start: 2021-03-29 — End: 2021-03-29
  Administered 2021-03-29: 10 mg via ORAL
  Filled 2021-03-29: qty 1

## 2021-03-29 NOTE — ED Nurses Note (Signed)
Report given to Apolonio Schneiders at [facility].

## 2021-03-29 NOTE — ED Nurses Note (Signed)
Spoke with [facility], they accepted transfer back to [facility]

## 2021-03-29 NOTE — ED Nurses Note (Signed)
6:05 [facility] called back with Dr. Maris Berger on the phone, Dr. Caffie Pinto in room a at time.  [place] back, patient accepted but no beds at this time.

## 2021-03-29 NOTE — ED Nurses Note (Signed)
Nurse from [facility] calls for patient update. Notified on plan of care and intentions on sending patient to [facility].

## 2021-03-29 NOTE — ED Nurses Note (Signed)
Spoke to [facility] line for [facility] to update on patient status, bed wait still at least 24 hours.

## 2021-03-29 NOTE — ED Nurses Note (Signed)
00:01 [facility] Line called to speak with Dr. Caffie Pinto, patient accepted but no bed at this time

## 2021-03-29 NOTE — ED Nurses Note (Signed)
5:39 [place] patient has been accepted by Dr. Leslye Peer, but no beds at this time maybe later today.  5:41 [facility] they have received information, hospitalist will be call shortly.  5:43 [facility] they have received information but no beds at this time maybe later today.

## 2021-03-29 NOTE — ED Nurses Note (Signed)
Provided patient [name] and ice chips per patient request. Patient denies other needs at this time. Call light within reach.

## 2021-03-29 NOTE — Care Management Notes (Signed)
20 yom who resides in a NH presented to [facility] ED after developing sudden onset severe substernal chest pain similar to what experienced with previous MI. History of CAD with 10 episodes of PCI/stents. ESRF receives dialysis M-W-F. EKG same as last month. Creatinine is 5.74. Hgb is 7.8, Using 5 L NC per baseline. Troponin slightly elevated at 33. After receiving fentanyl had pain reduced to 4/10. NTG drip did not provide relief.

## 2021-03-29 NOTE — ED Nurses Note (Signed)
Pt states last dialysis treatment was Friday 03/26/21.

## 2021-03-29 NOTE — ED Nurses Note (Signed)
Called [facility] dispatch about possible transfer back to [facility]

## 2021-03-29 NOTE — ED Nurses Note (Signed)
Patient is currently refusing IV access. Patient states he wants to go back to [facility] and refuses transfer to another facility. DO made aware and spoke with patient. Patient still persistent that he wants to leave AMA from the ER to go back to [facility].

## 2021-03-29 NOTE — ED Attending Handoff Note (Addendum)
Case handoff received from Dr. Caffie Pinto as a continuation of care.  Patient is a 51 year old male past medical history of type 2 diabetes, CKD on dialysis, CHF, CAD post 10 stents, history of osteomyelitis and right BKA.  She initially home patient who she ED to complain of chest pain while wall she.  She states chest pain a to the side table on.  Patient is due for dialysis today.  He was found to have a slightly elevated troponin of 33 but repeat was 29, normal potassium, elevated pro-BNP but improved from his baseline. He is currently receiving all of his home medications including his chronic pain meds and he is currently on a nitro drip for his chest pain. Patient was sleeping on assessment but easily arousable.    CV: RRR without murmurs, rubs, gallops  RESP: CTAB without crackles, wheezes, rhonchi  GI: normal bowel sounds, non-tender, soft, non-distended  MSK: right BKA, no swelling in left lower extremity    Plan: he is currently accepted for transfer to [facility] [place] pending a bed. If he becomes chest pain free we will we will discontinue the nitro drip. We will continue monitoring I&Os along with labs since patient may be missing dialysis today due to bed holds.     Patient did end up pulling out his IV.  He stated he was no longer having any chest pains therefore the IV nitro was discontinued.  There were attempts to regain IV access which were unsuccessful.  Patient stated that he felt better and want to go back to the nursing home for dialysis and to have his procedure completed tomorrow for his dialysis catheter.  Patient was informed that he could still be having a myocardial issue in that we recommended he be transferred to a facility that had dialysis and Cardiology.  After being informed of the risks and benefits of leaving versus transfer patient stated he felt improved and wanted to go back to the nursing home.  The nursing home was notified that patient would be returning as an [place].   Patient was instructed to seek medical attention if his symptoms worsen to return.  He expressed understanding of plan all questions are answered to satisfaction.    Clinical Imposition:  Unstable angina  Dialysis patient  Ischemic cardiomyopathy  Chronic anemia  Chronic pain    Disposition:  [place] Felisa Zechman, DO

## 2021-03-29 NOTE — ED Nurses Note (Signed)
Pt requesting to leave AMA. Patient had discussion with Dr. Efaw regarding the risks of leaving AMA and benefits of staying for treatment. The patient is of sound mind to make their own decisions and exhibited understanding. Encouraged patient to return immediately to the Emergency Department if they change their mind or have a change of condition. AMA paperwork signed.

## 2021-03-29 NOTE — ED Nurses Note (Signed)
IV access was removed while patient was changing positions. Nitro drip paused while trying to regain IV access. Asked patient about chest pain. Patient is currently denying chest pain at this time. DO made aware and states to continue holding the nitro drip at this time. Two unsuccessful IV attempts by this RN. Another nurse made aware to try to regain IV access.

## 2021-03-29 NOTE — ED Nurses Note (Addendum)
Patient provided breakfast tray. Patient denies other needs at this time. Call light within reach.

## 2021-03-30 ENCOUNTER — Other Ambulatory Visit: Payer: Self-pay

## 2021-03-30 ENCOUNTER — Emergency Department (HOSPITAL_COMMUNITY): Payer: Medicare (Managed Care) | Admitting: Radiology

## 2021-03-30 ENCOUNTER — Other Ambulatory Visit: Payer: Medicare (Managed Care) | Attending: Internal Medicine | Admitting: Internal Medicine

## 2021-03-30 ENCOUNTER — Observation Stay (HOSPITAL_COMMUNITY): Payer: Medicare (Managed Care) | Admitting: Radiology

## 2021-03-30 ENCOUNTER — Other Ambulatory Visit (HOSPITAL_COMMUNITY): Payer: Self-pay | Admitting: Internal Medicine

## 2021-03-30 ENCOUNTER — Ambulatory Visit (INDEPENDENT_AMBULATORY_CARE_PROVIDER_SITE_OTHER): Payer: Self-pay | Admitting: Surgery

## 2021-03-30 ENCOUNTER — Inpatient Hospital Stay
Admission: EM | Admit: 2021-03-30 | Discharge: 2021-04-06 | DRG: 286 | Disposition: A | Discharge status: Skilled Nursing Facility | Payer: Medicare (Managed Care) | Source: Skilled Nursing Facility | Attending: Internal Medicine | Admitting: Internal Medicine

## 2021-03-30 ENCOUNTER — Ambulatory Visit (HOSPITAL_BASED_OUTPATIENT_CLINIC_OR_DEPARTMENT_OTHER): Payer: Self-pay

## 2021-03-30 ENCOUNTER — Encounter (HOSPITAL_COMMUNITY): Payer: Self-pay | Admitting: Internal Medicine

## 2021-03-30 ENCOUNTER — Inpatient Hospital Stay (HOSPITAL_COMMUNITY): Payer: Medicare (Managed Care) | Admitting: Internal Medicine

## 2021-03-30 DIAGNOSIS — E1151 Type 2 diabetes mellitus with diabetic peripheral angiopathy without gangrene: Secondary | ICD-10-CM | POA: Diagnosis present

## 2021-03-30 DIAGNOSIS — I249 Acute ischemic heart disease, unspecified: Secondary | ICD-10-CM | POA: Diagnosis present

## 2021-03-30 DIAGNOSIS — Z95820 Peripheral vascular angioplasty status with implants and grafts: Secondary | ICD-10-CM

## 2021-03-30 DIAGNOSIS — Z86718 Personal history of other venous thrombosis and embolism: Secondary | ICD-10-CM

## 2021-03-30 DIAGNOSIS — Z992 Dependence on renal dialysis: Secondary | ICD-10-CM

## 2021-03-30 DIAGNOSIS — N186 End stage renal disease: Secondary | ICD-10-CM | POA: Diagnosis present

## 2021-03-30 DIAGNOSIS — I4510 Unspecified right bundle-branch block: Secondary | ICD-10-CM

## 2021-03-30 DIAGNOSIS — I132 Hypertensive heart and chronic kidney disease with heart failure and with stage 5 chronic kidney disease, or end stage renal disease: Secondary | ICD-10-CM | POA: Diagnosis present

## 2021-03-30 DIAGNOSIS — Z8614 Personal history of Methicillin resistant Staphylococcus aureus infection: Secondary | ICD-10-CM

## 2021-03-30 DIAGNOSIS — I509 Heart failure, unspecified: Secondary | ICD-10-CM

## 2021-03-30 DIAGNOSIS — Y831 Surgical operation with implant of artificial internal device as the cause of abnormal reaction of the patient, or of later complication, without mention of misadventure at the time of the procedure: Secondary | ICD-10-CM | POA: Diagnosis present

## 2021-03-30 DIAGNOSIS — D631 Anemia in chronic kidney disease: Secondary | ICD-10-CM | POA: Diagnosis present

## 2021-03-30 DIAGNOSIS — Z7901 Long term (current) use of anticoagulants: Secondary | ICD-10-CM

## 2021-03-30 DIAGNOSIS — I5032 Chronic diastolic (congestive) heart failure: Secondary | ICD-10-CM | POA: Diagnosis present

## 2021-03-30 DIAGNOSIS — I25119 Atherosclerotic heart disease of native coronary artery with unspecified angina pectoris: Principal | ICD-10-CM | POA: Diagnosis present

## 2021-03-30 DIAGNOSIS — D649 Anemia, unspecified: Secondary | ICD-10-CM | POA: Diagnosis present

## 2021-03-30 DIAGNOSIS — Z8249 Family history of ischemic heart disease and other diseases of the circulatory system: Secondary | ICD-10-CM

## 2021-03-30 DIAGNOSIS — I252 Old myocardial infarction: Secondary | ICD-10-CM

## 2021-03-30 DIAGNOSIS — R079 Chest pain, unspecified: Secondary | ICD-10-CM

## 2021-03-30 DIAGNOSIS — E876 Hypokalemia: Secondary | ICD-10-CM | POA: Diagnosis not present

## 2021-03-30 DIAGNOSIS — R9431 Abnormal electrocardiogram [ECG] [EKG]: Secondary | ICD-10-CM

## 2021-03-30 DIAGNOSIS — E875 Hyperkalemia: Secondary | ICD-10-CM | POA: Diagnosis present

## 2021-03-30 DIAGNOSIS — Z79891 Long term (current) use of opiate analgesic: Secondary | ICD-10-CM

## 2021-03-30 DIAGNOSIS — Z89511 Acquired absence of right leg below knee: Secondary | ICD-10-CM

## 2021-03-30 DIAGNOSIS — E1122 Type 2 diabetes mellitus with diabetic chronic kidney disease: Secondary | ICD-10-CM | POA: Diagnosis present

## 2021-03-30 DIAGNOSIS — E785 Hyperlipidemia, unspecified: Secondary | ICD-10-CM | POA: Diagnosis present

## 2021-03-30 DIAGNOSIS — I2510 Atherosclerotic heart disease of native coronary artery without angina pectoris: Secondary | ICD-10-CM | POA: Diagnosis present

## 2021-03-30 DIAGNOSIS — Z9115 Patient's noncompliance with renal dialysis: Secondary | ICD-10-CM

## 2021-03-30 DIAGNOSIS — T82855A Stenosis of coronary artery stent, initial encounter: Secondary | ICD-10-CM | POA: Diagnosis present

## 2021-03-30 DIAGNOSIS — G9341 Metabolic encephalopathy: Secondary | ICD-10-CM | POA: Insufficient documentation

## 2021-03-30 DIAGNOSIS — Z79899 Other long term (current) drug therapy: Secondary | ICD-10-CM

## 2021-03-30 DIAGNOSIS — Z7982 Long term (current) use of aspirin: Secondary | ICD-10-CM

## 2021-03-30 DIAGNOSIS — Z794 Long term (current) use of insulin: Secondary | ICD-10-CM

## 2021-03-30 HISTORY — DX: Personal history of Methicillin resistant Staphylococcus aureus infection: Z86.14

## 2021-03-30 LAB — CBC WITH DIFF
HCT: 25.6 % — ABNORMAL LOW (ref 38.9–52.0)
HGB: 7.6 g/dL — ABNORMAL LOW (ref 13.4–17.5)
MCH: 26.4 pg (ref 26.0–32.0)
MCHC: 29.7 g/dL — ABNORMAL LOW (ref 31.0–35.5)
MCV: 88.9 fL (ref 78.0–100.0)
MPV: 9.6 fL (ref 8.7–12.5)
PLATELETS: 496 10*3/uL — ABNORMAL HIGH (ref 150–400)
RBC: 2.88 10*6/uL — ABNORMAL LOW (ref 4.50–6.10)
RDW-CV: 14.9 % (ref 11.5–15.5)
WBC: 13.8 10*3/uL — ABNORMAL HIGH (ref 3.7–11.0)

## 2021-03-30 LAB — ECG 12 LEAD
Atrial Rate: 72 {beats}/min
Atrial Rate: 74 {beats}/min
Atrial Rate: 74 {beats}/min
Calculated P Axis: 31 degrees
Calculated P Axis: 34 degrees
Calculated P Axis: 22 degrees
Calculated R Axis: 121 degrees
Calculated R Axis: 113 degrees
Calculated T Axis: -21 degrees
Calculated T Axis: -26 degrees
Calculated T Axis: -20 degrees
PR Interval: 186 ms
PR Interval: 184 ms
PR Interval: 200 ms
PR Interval: 186 ms
QRS Duration: 154 ms
QRS Duration: 156 ms
QRS Duration: 158 ms
QRS Duration: 156 ms
QT Interval: 458 ms
QT Interval: 458 ms
QTC Calculation: 501 ms
QTC Calculation: 508 ms
QTC Calculation: 521 ms
Ventricular rate: 72 {beats}/min
Ventricular rate: 74 {beats}/min

## 2021-03-30 LAB — MANUAL DIFF AND MORPHOLOGY-SYSMEX
BASOPHIL #: 0.14 10*3/uL (ref <=0.20)
BASOPHIL %: 1 %
EOSINOPHIL #: 0.14 10*3/uL (ref <=0.50)
EOSINOPHIL %: 1 %
LYMPHOCYTE #: 2.07 10*3/uL (ref 1.00–4.80)
LYMPHOCYTE %: 15 %
MONOCYTE #: 1.66 10*3/uL — ABNORMAL HIGH (ref 0.20–1.10)
MONOCYTE %: 12 %
NEUTROPHIL #: 9.8 10*3/uL — ABNORMAL HIGH (ref 1.50–7.70)
NEUTROPHIL %: 71 %
RBC MORPHOLOGY: NORMAL

## 2021-03-30 LAB — BASIC METABOLIC PANEL
ANION GAP: 12 mmol/L (ref 4–13)
BUN/CREA RATIO: 7 (ref 6–22)
BUN: 54 mg/dL — ABNORMAL HIGH (ref 8–25)
CALCIUM: 9.3 mg/dL (ref 8.5–10.0)
CHLORIDE: 99 mmol/L (ref 96–111)
CO2 TOTAL: 24 mmol/L (ref 22–30)
CREATININE: 7.36 mg/dL — ABNORMAL HIGH (ref 0.75–1.35)
ESTIMATED GFR: 8 mL/min/BSA — ABNORMAL LOW (ref >=60)
GLUCOSE: 198 mg/dL — ABNORMAL HIGH (ref 65–125)
POTASSIUM: 5.7 mmol/L — ABNORMAL HIGH (ref 3.5–5.1)
SODIUM: 135 mmol/L — ABNORMAL LOW (ref 136–145)

## 2021-03-30 LAB — COMPREHENSIVE METABOLIC PANEL, NON-FASTING
ALBUMIN: 2.8 g/dL — ABNORMAL LOW (ref 3.5–5.0)
ALKALINE PHOSPHATASE: 322 U/L — ABNORMAL HIGH (ref 45–115)
ALT (SGPT): 7 U/L — ABNORMAL LOW (ref 10–55)
ANION GAP: 15 mmol/L — ABNORMAL HIGH (ref 4–13)
AST (SGOT): 10 U/L (ref 8–45)
BILIRUBIN TOTAL: 0.6 mg/dL (ref 0.3–1.3)
BUN/CREA RATIO: 7 (ref 6–22)
BUN: 53 mg/dL — ABNORMAL HIGH (ref 8–25)
CALCIUM: 8.7 mg/dL (ref 8.5–10.0)
CHLORIDE: 96 mmol/L (ref 96–111)
CO2 TOTAL: 22 mmol/L (ref 22–30)
CREATININE: 7.26 mg/dL — ABNORMAL HIGH (ref 0.75–1.35)
ESTIMATED GFR: 9 mL/min/BSA — ABNORMAL LOW (ref >=60)
GLUCOSE: 93 mg/dL (ref 65–125)
POTASSIUM: 5.3 mmol/L — ABNORMAL HIGH (ref 3.5–5.1)
PROTEIN TOTAL: 8.2 g/dL (ref 6.4–8.3)
SODIUM: 133 mmol/L — ABNORMAL LOW (ref 136–145)

## 2021-03-30 LAB — TROPONIN-I
TROPONIN I: 31 ng/L (ref <=30)
TROPONIN I: 35 ng/L — ABNORMAL HIGH (ref <=30)

## 2021-03-30 LAB — PT/INR
INR: 1.74 — ABNORMAL LOW (ref 2.00–3.00)
INR: 1.63 (ref <=5.00)
PROTHROMBIN TIME: 20.2 seconds — ABNORMAL HIGH (ref 9.5–13.4)
PROTHROMBIN TIME: 19.5 seconds — ABNORMAL HIGH (ref 9.7–13.6)

## 2021-03-30 LAB — PTT (PARTIAL THROMBOPLASTIN TIME): APTT: 52.6 seconds — ABNORMAL HIGH (ref 26.0–39.0)

## 2021-03-30 LAB — LDL CHOLESTEROL, DIRECT: LDL DIRECT: 29 mg/dL (ref <100)

## 2021-03-30 LAB — HEPATITIS B SURFACE ANTIGEN: HBV SURFACE ANTIGEN QUALITATIVE: NEGATIVE

## 2021-03-30 MED ORDER — OXYCODONE 5 MG TABLET
5.0000 mg | ORAL_TABLET | Freq: Four times a day (QID) | ORAL | Status: DC | PRN
Start: 2021-03-30 — End: 2021-04-04
  Administered 2021-03-30 – 2021-04-04 (×17): 5 mg via ORAL
  Filled 2021-03-30 (×17): qty 1

## 2021-03-30 MED ORDER — SODIUM CHLORIDE 0.9 % (FLUSH) INJECTION SYRINGE
3.0000 mL | INJECTION | Freq: Three times a day (TID) | INTRAMUSCULAR | Status: DC
Start: 2021-03-30 — End: 2021-04-06
  Administered 2021-03-30 – 2021-03-31 (×2): 0 mL
  Administered 2021-03-31: 3 mL
  Administered 2021-03-31 – 2021-04-01 (×3): 0 mL
  Administered 2021-04-01 – 2021-04-02 (×2): 3 mL
  Administered 2021-04-02 – 2021-04-04 (×7): 0 mL
  Administered 2021-04-04: 3 mL
  Administered 2021-04-05 – 2021-04-06 (×4): 0 mL

## 2021-03-30 MED ORDER — HEPARIN 1,000 UNITS/ML BOLUS FOR DOSE ADJUSTMENT
1.0000 mL | Freq: Once | INTRAMUSCULAR | Status: AC | PRN
Start: 2021-03-30 — End: 2021-03-30
  Administered 2021-03-30: 3200 [IU]

## 2021-03-30 MED ORDER — WARFARIN 3 MG TABLET
6.0000 mg | ORAL_TABLET | Freq: Once | ORAL | Status: AC
Start: 2021-03-30 — End: 2021-03-30
  Administered 2021-03-30: 6 mg via ORAL
  Filled 2021-03-30: qty 2

## 2021-03-30 MED ORDER — ASPIRIN 81 MG CHEWABLE TABLET
81.0000 mg | CHEWABLE_TABLET | Freq: Every day | ORAL | Status: DC
Start: 2021-03-30 — End: 2021-04-01
  Administered 2021-03-30: 0 mg via ORAL
  Administered 2021-03-31 – 2021-04-01 (×2): 81 mg via ORAL
  Filled 2021-03-30 (×2): qty 1

## 2021-03-30 MED ORDER — FENTANYL (PF) 50 MCG/ML INJECTION SOLUTION
50.0000 ug | INTRAMUSCULAR | Status: AC
Start: 2021-03-30 — End: 2021-03-30
  Administered 2021-03-30: 50 ug via INTRAVENOUS
  Filled 2021-03-30: qty 2

## 2021-03-30 MED ORDER — SEVELAMER CARBONATE 800 MG TABLET
800.0000 mg | ORAL_TABLET | Freq: Three times a day (TID) | ORAL | Status: DC
Start: 2021-03-31 — End: 2021-04-06
  Administered 2021-03-31 – 2021-04-05 (×16): 800 mg via ORAL
  Administered 2021-04-05: 0 mg via ORAL
  Administered 2021-04-05: 800 mg via ORAL
  Administered 2021-04-06: 0 mg via ORAL
  Administered 2021-04-06: 800 mg via ORAL
  Filled 2021-03-30 (×19): qty 1

## 2021-03-30 MED ORDER — ISOSORBIDE MONONITRATE ER 60 MG TABLET,EXTENDED RELEASE 24 HR
60.0000 mg | ORAL_TABLET | Freq: Every morning | ORAL | Status: DC
Start: 2021-03-31 — End: 2021-04-06
  Administered 2021-03-31: 60 mg via ORAL
  Administered 2021-04-01: 0 mg via ORAL
  Administered 2021-04-02 – 2021-04-06 (×5): 60 mg via ORAL
  Filled 2021-03-30 (×7): qty 1

## 2021-03-30 MED ORDER — EPOETIN ALFA 20,000 UNIT/ML INJECTION
150.0000 [IU]/kg | INTRAMUSCULAR | Status: AC
Start: 2021-03-30 — End: 2021-03-30
  Administered 2021-03-30: 11000 [IU] via INTRAVENOUS
  Filled 2021-03-30: qty 1

## 2021-03-30 MED ORDER — ATORVASTATIN 40 MG TABLET
80.0000 mg | ORAL_TABLET | Freq: Every evening | ORAL | Status: DC
Start: 2021-03-30 — End: 2021-04-06
  Administered 2021-03-30 – 2021-04-05 (×7): 80 mg via ORAL
  Filled 2021-03-30 (×7): qty 2

## 2021-03-30 MED ORDER — ONDANSETRON HCL (PF) 4 MG/2 ML INJECTION SOLUTION
4.0000 mg | INTRAMUSCULAR | Status: AC
Start: 2021-03-30 — End: 2021-03-30
  Administered 2021-03-30: 4 mg via INTRAVENOUS
  Filled 2021-03-30: qty 2

## 2021-03-30 MED ORDER — CYCLOBENZAPRINE 10 MG TABLET
5.0000 mg | ORAL_TABLET | Freq: Three times a day (TID) | ORAL | Status: DC | PRN
Start: 2021-03-30 — End: 2021-04-06
  Administered 2021-04-03 – 2021-04-04 (×3): 5 mg via ORAL
  Filled 2021-03-30 (×3): qty 1

## 2021-03-30 MED ORDER — AMLODIPINE 10 MG TABLET
10.0000 mg | ORAL_TABLET | Freq: Every day | ORAL | Status: DC
Start: 2021-03-30 — End: 2021-04-01
  Administered 2021-03-30: 0 mg via ORAL
  Administered 2021-03-31: 10 mg via ORAL
  Filled 2021-03-30: qty 1

## 2021-03-30 MED ORDER — EZETIMIBE 10 MG TABLET
10.0000 mg | ORAL_TABLET | Freq: Every evening | ORAL | Status: DC
Start: 2021-03-30 — End: 2021-04-06
  Administered 2021-03-30 – 2021-04-05 (×7): 10 mg via ORAL
  Filled 2021-03-30 (×7): qty 1

## 2021-03-30 MED ORDER — TORSEMIDE 20 MG TABLET
20.0000 mg | ORAL_TABLET | Freq: Two times a day (BID) | ORAL | Status: DC
Start: 2021-03-30 — End: 2021-04-06
  Administered 2021-03-30: 0 mg via ORAL
  Administered 2021-03-31 – 2021-04-06 (×13): 20 mg via ORAL
  Filled 2021-03-30 (×13): qty 1

## 2021-03-30 MED ORDER — ONDANSETRON HCL (PF) 4 MG/2 ML INJECTION SOLUTION
4.0000 mg | Freq: Four times a day (QID) | INTRAMUSCULAR | Status: DC | PRN
Start: 2021-03-30 — End: 2021-04-06

## 2021-03-30 MED ORDER — POLYETHYLENE GLYCOL 3350 17 GRAM ORAL POWDER PACKET
17.0000 g | Freq: Every day | ORAL | Status: DC
Start: 2021-03-30 — End: 2021-04-06
  Administered 2021-03-30 – 2021-03-31 (×2): 0 g via ORAL
  Administered 2021-04-01: 17 g via ORAL
  Administered 2021-04-02: 0 g via ORAL
  Administered 2021-04-03 – 2021-04-06 (×4): 17 g via ORAL
  Filled 2021-03-30 (×7): qty 1

## 2021-03-30 MED ORDER — SODIUM CHLORIDE 0.9 % (FLUSH) INJECTION SYRINGE
3.0000 mL | INJECTION | INTRAMUSCULAR | Status: DC | PRN
Start: 2021-03-30 — End: 2021-04-06

## 2021-03-30 MED ORDER — SUCRALFATE 1 GRAM TABLET
1.0000 g | ORAL_TABLET | Freq: Three times a day (TID) | ORAL | Status: DC
Start: 2021-03-31 — End: 2021-03-31
  Administered 2021-03-31: 1 g via ORAL
  Filled 2021-03-30: qty 1

## 2021-03-30 MED ORDER — GABAPENTIN 400 MG CAPSULE
400.0000 mg | ORAL_CAPSULE | Freq: Three times a day (TID) | ORAL | Status: DC
Start: 2021-03-30 — End: 2021-04-06
  Administered 2021-03-30 – 2021-04-06 (×20): 400 mg via ORAL
  Filled 2021-03-30 (×20): qty 1

## 2021-03-30 MED ORDER — SODIUM CHLORIDE 0.9 % (FLUSH) INJECTION SYRINGE
3.0000 mL | INJECTION | Freq: Three times a day (TID) | INTRAMUSCULAR | Status: DC
Start: 2021-03-30 — End: 2021-04-06
  Administered 2021-03-30 – 2021-03-31 (×4): 0 mL
  Administered 2021-03-31: 3 mL
  Administered 2021-04-01: 0 mL
  Administered 2021-04-01: 3 mL
  Administered 2021-04-01: 0 mL
  Administered 2021-04-02: 3 mL
  Administered 2021-04-02 – 2021-04-06 (×13): 0 mL

## 2021-03-30 MED ORDER — PANTOPRAZOLE 40 MG TABLET,DELAYED RELEASE
40.0000 mg | DELAYED_RELEASE_TABLET | Freq: Every day | ORAL | Status: DC
Start: 2021-03-31 — End: 2021-04-06
  Administered 2021-03-31 – 2021-04-06 (×7): 40 mg via ORAL
  Filled 2021-03-30 (×7): qty 1

## 2021-03-30 MED ORDER — CARVEDILOL 12.5 MG TABLET
25.0000 mg | ORAL_TABLET | Freq: Two times a day (BID) | ORAL | Status: DC
Start: 2021-03-31 — End: 2021-04-06
  Administered 2021-03-31 (×2): 25 mg via ORAL
  Administered 2021-04-01 (×2): 0 mg via ORAL
  Administered 2021-04-02 – 2021-04-06 (×9): 25 mg via ORAL
  Filled 2021-03-30 (×13): qty 2

## 2021-03-30 MED ORDER — INSULIN GLARGINE-YFGN (U-100) 100 UNIT/ML (3 ML) SUBCUTANEOUS PEN
8.0000 [IU] | PEN_INJECTOR | Freq: Two times a day (BID) | SUBCUTANEOUS | Status: DC
Start: 2021-03-30 — End: 2021-04-06
  Administered 2021-03-30: 0 [IU] via SUBCUTANEOUS
  Administered 2021-03-31 (×2): 8 [IU] via SUBCUTANEOUS
  Administered 2021-04-01 (×2): 0 [IU] via SUBCUTANEOUS
  Administered 2021-04-02 – 2021-04-04 (×6): 8 [IU] via SUBCUTANEOUS
  Administered 2021-04-05: 0 [IU] via SUBCUTANEOUS
  Administered 2021-04-05 – 2021-04-06 (×2): 8 [IU] via SUBCUTANEOUS
  Filled 2021-03-30 (×2): qty 3

## 2021-03-30 MED ORDER — PARICALCITOL 5 MCG/ML INTRAVENOUS SOLUTION
2.0000 ug | INTRAVENOUS | Status: AC
Start: 2021-03-30 — End: 2021-03-30
  Administered 2021-03-30: 2 ug via INTRAVENOUS

## 2021-03-30 MED ORDER — INSULIN LISPRO 100 UNIT/ML INJECTION CORRECTIONAL - CCMC
2.0000 [IU] | PEN_INJECTOR | Freq: Three times a day (TID) | SUBCUTANEOUS | Status: DC
Start: 2021-03-31 — End: 2021-04-06
  Administered 2021-03-31 (×2): 4 [IU] via SUBCUTANEOUS
  Administered 2021-03-31: 8 [IU] via SUBCUTANEOUS
  Administered 2021-04-01: 0 [IU] via SUBCUTANEOUS
  Administered 2021-04-01: 8 [IU] via SUBCUTANEOUS
  Administered 2021-04-01: 0 [IU] via SUBCUTANEOUS
  Administered 2021-04-02: 6 [IU] via SUBCUTANEOUS
  Administered 2021-04-02: 0 [IU] via SUBCUTANEOUS
  Administered 2021-04-02 – 2021-04-03 (×2): 2 [IU] via SUBCUTANEOUS
  Administered 2021-04-03: 4 [IU] via SUBCUTANEOUS
  Administered 2021-04-03: 8 [IU] via SUBCUTANEOUS
  Administered 2021-04-04: 4 [IU] via SUBCUTANEOUS
  Administered 2021-04-04: 6 [IU] via SUBCUTANEOUS
  Administered 2021-04-04: 10 [IU] via SUBCUTANEOUS
  Administered 2021-04-05 (×2): 0 [IU] via SUBCUTANEOUS
  Administered 2021-04-05: 2 [IU] via SUBCUTANEOUS
  Administered 2021-04-06 (×2): 0 [IU] via SUBCUTANEOUS
  Filled 2021-03-30 (×2): qty 300

## 2021-03-30 MED ORDER — SODIUM ZIRCONIUM CYCLOSILICATE 10 GRAM ORAL POWDER PACKET
10.0000 g | Freq: Every day | ORAL | Status: DC
Start: 2021-03-30 — End: 2021-04-05
  Administered 2021-03-30: 0 g via ORAL
  Administered 2021-03-31 – 2021-04-05 (×6): 10 g via ORAL
  Filled 2021-03-30 (×6): qty 1

## 2021-03-30 MED ORDER — FERROUS SULFATE 325 MG (65 MG IRON) TABLET
325.0000 mg | ORAL_TABLET | Freq: Every day | ORAL | Status: DC
Start: 2021-03-30 — End: 2021-04-06
  Administered 2021-03-30: 0 mg via ORAL
  Administered 2021-03-31 – 2021-04-06 (×7): 325 mg via ORAL
  Filled 2021-03-30 (×7): qty 1

## 2021-03-30 MED ORDER — WARFARIN - PHARMACIST TO DOSE PER PROTOCOL
Freq: Every day | Status: DC | PRN
Start: 2021-03-30 — End: 2021-04-06

## 2021-03-30 MED ORDER — DOCUSATE SODIUM 100 MG CAPSULE
100.0000 mg | ORAL_CAPSULE | Freq: Two times a day (BID) | ORAL | Status: DC
Start: 2021-03-30 — End: 2021-04-06
  Administered 2021-03-30 – 2021-03-31 (×2): 100 mg via ORAL
  Administered 2021-03-31 – 2021-04-01 (×2): 0 mg via ORAL
  Administered 2021-04-01: 100 mg via ORAL
  Administered 2021-04-02: 0 mg via ORAL
  Administered 2021-04-02 – 2021-04-06 (×8): 100 mg via ORAL
  Filled 2021-03-30 (×14): qty 1

## 2021-03-30 MED ORDER — HEPARIN 1,000 UNITS/ML BOLUS FOR DOSE ADJUSTMENT
1.0000 mL | Freq: Once | INTRAMUSCULAR | Status: AC | PRN
Start: 2021-03-30 — End: 2021-03-31
  Administered 2021-03-31: 3200 [IU]

## 2021-03-30 MED ORDER — DULOXETINE 60 MG CAPSULE,DELAYED RELEASE
60.0000 mg | DELAYED_RELEASE_CAPSULE | Freq: Every day | ORAL | Status: DC
Start: 2021-03-30 — End: 2021-04-06
  Administered 2021-03-30: 0 mg via ORAL
  Administered 2021-03-31 – 2021-04-06 (×7): 60 mg via ORAL
  Filled 2021-03-30 (×7): qty 1

## 2021-03-30 MED ORDER — RANOLAZINE ER 500 MG TABLET,EXTENDED RELEASE,12 HR
500.0000 mg | ORAL_TABLET | Freq: Two times a day (BID) | ORAL | Status: DC
Start: 2021-03-30 — End: 2021-04-06
  Administered 2021-03-30 – 2021-04-05 (×12): 500 mg via ORAL
  Administered 2021-04-05: 0 mg via ORAL
  Administered 2021-04-06: 500 mg via ORAL
  Filled 2021-03-30 (×14): qty 1

## 2021-03-30 MED ORDER — MAGNESIUM HYDROXIDE 400 MG/5 ML ORAL SUSPENSION
15.0000 mL | Freq: Every day | ORAL | Status: DC | PRN
Start: 2021-03-30 — End: 2021-04-06

## 2021-03-30 NOTE — ED Provider Notes (Signed)
Emergency Department  Provider Note  HPI - 03/30/2021    Name: Max [name].  Age and Gender: 51 y.o. male  Attending: Dr. Phill Mutter  APP: Marily Lente, PA-C    PCP: Azzie Roup, MD    History provided by: Patient    HPI:  Max [name]. is a 51 y.o. male  who presents to the ED today for evaluation of persistent chest pain.  Patient notes that he was recently admitted at [facility] for chest pain and was due to be transferred to [facility] for their cardiology services; however, patient decided he would not want to be transferred there and signed out against medical advice after reporting that his chest pain had resolved (despite the fact that he states he was still having chest pain).  He rates his current pain today as an 8.5/10 on the pain scale.  He associates shortness of breath and occasional dizziness.  He reports a history of multiple cardiac stents in the past and mentions that he is also a diabetic with chronic kidney disease on dialysis.  He reports missing dialysis since last week (he is typically supposed to go to dialysis Monday, Wednesday, and Friday).  He does this through [place].  Patient reports that he feels as if he is fluid overloaded and states it is difficult to take a deep breath.  He does not mention any other concerns or complaints at this time.    Review of Systems:   All systems were reviewed and are negative unless commented on in the HPI.      The below information was reviewed with the patient:     Current Medications:  Current Outpatient Medications   Medication Sig   . amLODIPine (NORVASC) 10 mg Oral Tablet Take 1 Tablet (10 mg total) by mouth Once a day   . aspirin (ECOTRIN) 81 mg Oral Tablet, Delayed Release (E.C.) Take 1 Tablet (81 mg total) by mouth Once a day   . atorvastatin (LIPITOR) 80 mg Oral Tablet Take 1 Tablet (80 mg total) by mouth Every evening   . bacitracin 500 unit/gram Packet Apply 1 Packet topically Twice per day as needed for Other   .  carvediloL (COREG) 25 mg Oral Tablet Take 1 Tablet (25 mg total) by mouth Twice daily with food   . cyclobenzaprine (FLEXERIL) 5 mg Oral Tablet Take 1 Tablet (5 mg total) by mouth Three times a day as needed for Muscle spasms   . docusate sodium (COLACE) 100 mg Oral Capsule Take 1 Capsule (100 mg total) by mouth Twice daily   . DULoxetine (CYMBALTA DR) 60 mg Oral Capsule, Delayed Release(E.C.) Take 1 Capsule (60 mg total) by mouth Once a day   . esomeprazole magnesium (NEXIUM) 20 mg Oral Capsule, Delayed Release(E.C.) Take 1 Capsule (20 mg total) by mouth Every morning before breakfast   . ezetimibe (ZETIA) 10 mg Oral Tablet Take 1 Tablet (10 mg total) by mouth Every evening   . ferrous sulfate (FERATAB) 324 mg (65 mg iron) Oral Tablet, Delayed Release (E.C.) Take 1 Tablet (324 mg total) by mouth   . gabapentin (NEURONTIN) 400 mg Oral Capsule Take 1 Capsule (400 mg total) by mouth Three times a day   . insulin glargine-yfgn 100 unit/mL Subcutaneous Inject 8 Units under the skin Once a day   . insulin lispro 100 units/mL Subcutaneous Injectable Inject 0-12 Units under the skin Every 4 hours as needed for Other   . isosorbide mononitrate (IMDUR) 60 mg  Oral Tablet Sustained Release 24 hr Take 1 Tablet (60 mg total) by mouth Every morning   . lidocaine (LIDODERM) 5 % Adhesive Patch, Medicated Place 1 Patch (700 mg total) on the skin Once a day   . oxyCODONE (ROXICODONE) 5 mg Oral Tablet Take 1 Tablet (5 mg total) by mouth Every 6 hours as needed   . polyethylene glycol (MIRALAX) 17 gram Oral Powder in Packet Take 1 Packet (17 g total) by mouth Once a day   . ranolazine (RANEXA) 500 mg Oral Tablet Sustained Release 12 hr Take 1 Tablet (500 mg total) by mouth Twice daily   . sevelamer (RENAGEL) 800 mg Oral Tablet Take 1 Tablet (800 mg total) by mouth Three times daily with meals   . sodium zirconium cyclosilicate (LOKELMA) 10 gram Oral Powder in Packet Take 1 Packet (10 g total) by mouth Once a day   . sucralfate  (CARAFATE) 1 gram Oral Tablet Take 1 Tablet (1 g total) by mouth Three times daily before meals   . torsemide (DEMADEX) 20 mg Oral Tablet Take 1 Tablet (20 mg total) by mouth Twice daily   . Warfarin (COUMADIN) 3 mg Oral Tablet Take 1 Tablet (3 mg total) by mouth Once a day       Allergies:   Allergies   Allergen Reactions   . Ultram [Tramadol] Rash   . Benadryl [Diphenhydramine]  Other Adverse Reaction (Add comment)     irritable   . Penicillin G Benethamine        Past Medical History:  Past Medical History:   Diagnosis Date   . Arthropathy    . CKD (chronic kidney disease), stage III (CMS HCC) 08/25/2019   . Congestive heart failure (CMS HCC)    . Coronary artery disease    . Diabetes mellitus, type 2 (CMS HCC)    . Headache    . HTN (hypertension)        Past Surgical History:  Past Surgical History:   Procedure Laterality Date   . Amputation foot / toe Left    . Coronary artery angioplasty     . Hx cataract removal Bilateral        Social History:  Social History     Tobacco Use   . Smoking status: Never   . Smokeless tobacco: Never   Vaping Use   . Vaping Use: Never used   Substance Use Topics   . Alcohol use: Never   . Drug use: Never     Social History     Substance and Sexual Activity   Drug Use Never       Family History:  Family History   Problem Relation Age of Onset   . Coronary Artery Disease Mother    . Coronary Artery Disease Father    . Cancer Father        Old records were reviewed.    Objective:  Nursing notes were reviewed.    Filed Vitals:    03/30/21 1429 03/30/21 1530 03/30/21 1600   BP: 98/65 96/66 104/67   Pulse: 73 73 73   Resp: 18 (!) 11 13   Temp: 36.7 C (98 F)     SpO2: 99% 92% 98%       Physical Exam:  Nursing note and vitals reviewed.  Vital signs reviewed as above.     Constitutional: Patient is well-developed and well-nourished.   Head: Normocephalic and atraumatic.   Eyes: Conjunctivae are normal. Pupils are equal, round,  and reactive to light. EOM are intact.  Neck: Soft, supple.  Full range of motion.  Cardiovascular: RRR. No murmurs/rubs/gallops. Distal pulses present and equal bilaterally.  Pulmonary/Chest:  Diminished breath sounds bilaterally.  No distress.   GI: Abdomen is soft, nontender, and nondistended. No rebound, guarding, or masses.  Back: Nontender. Normal range of motion.  Extremities:  Right below-the-knee amputation with clean, intact bandage in place.  Normal range of motion. Exhibits no edema and no tenderness.   Skin: Warm and dry. No rashes.  Neurological: Alert and oriented x 3. Neurologically intact. No focal deficits noted.  Psychiatric: Patient has a normal mood and affect.     Plan:   Appropriate work-up ordered. Medical Records reviewed.    Work-up:  Orders Placed This Encounter   . XR AP MOBILE CHEST   . TROPONIN-I (SERIAL TROPONINS)   . CBC/DIFF   . COMPREHENSIVE METABOLIC PANEL, NON-FASTING   . LDL CHOLESTEROL, DIRECT   . PT/INR   . PTT (PARTIAL THROMBOPLASTIN TIME)   . CBC WITH DIFF   . MANUAL DIFF AND MORPHOLOGY-SYSMEX   . OXYGEN PROTOCOL   . ECG 12 LEAD- TIME WITH TROPONINS   . INSERT & MAINTAIN PERIPHERAL IV ACCESS   . PERIPHERAL IV DRESSING CHANGE   . PATIENT CLASS/LEVEL OF CARE DESIGNATION - [place]   . NS flush syringe   . NS flush syringe   . fentaNYL (SUBLIMAZE) 50 mcg/mL injection   . ondansetron (ZOFRAN) 2 mg/mL injection        Labs:  Results for orders placed or performed during the hospital encounter of 03/30/21 (from the past 24 hour(s))   TROPONIN-I (SERIAL TROPONINS)   Result Value Ref Range    TROPONIN I 31 (HH) <=30 ng/L   CBC/DIFF    Narrative    The following orders were created for panel order CBC/DIFF.  Procedure                               Abnormality         Status                     ---------                               -----------         ------                     CBC WITH DIFF[[id number]]                Abnormal            Final result               MANUAL DIFF AND MORPHOLO.Marland KitchenMarland Kitchen[[id number]]  Abnormal            Final result                  Please view results for these tests on the individual orders.   COMPREHENSIVE METABOLIC PANEL, NON-FASTING   Result Value Ref Range    SODIUM 133 (L) 136 - 145 mmol/L    POTASSIUM 5.3 (H) 3.5 - 5.1 mmol/L    CHLORIDE 96 96 - 111 mmol/L    CO2 TOTAL 22 22 - 30 mmol/L    ANION GAP 15 (H) 4 - 13 mmol/L  BUN 53 (H) 8 - 25 mg/dL    CREATININE 7.26 (H) 0.75 - 1.35 mg/dL    BUN/CREA RATIO 7 6 - 22    ESTIMATED GFR 9 (L) >=60 mL/min/BSA    ALBUMIN 2.8 (L) 3.5 - 5.0 g/dL     CALCIUM 8.7 8.5 - 10.0 mg/dL    GLUCOSE 93 65 - 125 mg/dL    ALKALINE PHOSPHATASE 322 (H) 45 - 115 U/L    ALT (SGPT) 7 (L) 10 - 55 U/L    AST (SGOT)  10 8 - 45 U/L    BILIRUBIN TOTAL 0.6 0.3 - 1.3 mg/dL    PROTEIN TOTAL 8.2 6.4 - 8.3 g/dL   LDL CHOLESTEROL, DIRECT   Result Value Ref Range    LDL DIRECT 29 <100 mg/dL   PT/INR   Result Value Ref Range    PROTHROMBIN TIME 19.5 (H) 9.7 - 13.6 seconds    INR 1.63 <=5.00   PTT (PARTIAL THROMBOPLASTIN TIME)   Result Value Ref Range    APTT 52.6 (H) 26.0 - 39.0 seconds   CBC WITH DIFF   Result Value Ref Range    WBC 13.8 (H) 3.7 - 11.0 x10^3/uL    RBC 2.88 (L) 4.50 - 6.10 x10^6/uL    HGB 7.6 (L) 13.4 - 17.5 g/dL    HCT 25.6 (L) 38.9 - 52.0 %    MCV 88.9 78.0 - 100.0 fL    MCH 26.4 26.0 - 32.0 pg    MCHC 29.7 (L) 31.0 - 35.5 g/dL    RDW-CV 14.9 11.5 - 15.5 %    PLATELETS 496 (H) 150 - 400 x10^3/uL    MPV 9.6 8.7 - 12.5 fL   MANUAL DIFF AND MORPHOLOGY-SYSMEX   Result Value Ref Range    NEUTROPHIL % 71 %    LYMPHOCYTE %  15 %    MONOCYTE % 12 %    EOSINOPHIL % 1 %    BASOPHIL % 1 %    NEUTROPHIL # 9.80 (H) 1.50 - 7.70 x10^3/uL    LYMPHOCYTE # 2.07 1.00 - 4.80 x10^3/uL    MONOCYTE # 1.66 (H) 0.20 - 1.10 x10^3/uL    EOSINOPHIL # 0.14 <=0.50 x10^3/uL    BASOPHIL # 0.14 <=0.20 x10^3/uL    RBC MORPHOLOGY Normal RBC and PLT Morphology    Results for orders placed or performed in visit on 03/30/21 (from the past 24 hour(s))   BASIC METABOLIC PANEL   Result Value Ref Range    SODIUM 135 (L) 136 - 145 mmol/L     POTASSIUM 5.7 (H) 3.5 - 5.1 mmol/L    CHLORIDE 99 96 - 111 mmol/L    CO2 TOTAL 24 22 - 30 mmol/L    ANION GAP 12 4 - 13 mmol/L    CALCIUM 9.3 8.5 - 10.0 mg/dL    GLUCOSE 198 (H) 65 - 125 mg/dL    BUN 54 (H) 8 - 25 mg/dL    CREATININE 7.36 (H) 0.75 - 1.35 mg/dL    BUN/CREA RATIO 7 6 - 22    ESTIMATED GFR 8 (L) >=60 mL/min/BSA       Abnormal Lab results:  Labs Ordered/Reviewed   TROPONIN-I - Abnormal; Notable for the following components:       Result Value    TROPONIN I 31 (*)     All other components within normal limits   COMPREHENSIVE METABOLIC PANEL, NON-FASTING - Abnormal; Notable for the following components:    SODIUM 133 (*)  POTASSIUM 5.3 (*)     ANION GAP 15 (*)     BUN 53 (*)     CREATININE 7.26 (*)     ESTIMATED GFR 9 (*)     ALBUMIN 2.8 (*)     ALKALINE PHOSPHATASE 322 (*)     ALT (SGPT) 7 (*)     All other components within normal limits   PT/INR - Abnormal; Notable for the following components:    PROTHROMBIN TIME 19.5 (*)     All other components within normal limits   PTT (PARTIAL THROMBOPLASTIN TIME) - Abnormal; Notable for the following components:    APTT 52.6 (*)     All other components within normal limits   CBC WITH DIFF - Abnormal; Notable for the following components:    WBC 13.8 (*)     RBC 2.88 (*)     HGB 7.6 (*)     HCT 25.6 (*)     MCHC 29.7 (*)     PLATELETS 496 (*)     All other components within normal limits   MANUAL DIFF AND MORPHOLOGY-SYSMEX - Abnormal; Notable for the following components:    NEUTROPHIL # 9.80 (*)     MONOCYTE # 1.66 (*)     All other components within normal limits   LDL CHOLESTEROL, DIRECT - Normal   CBC/DIFF    Narrative:     The following orders were created for panel order CBC/DIFF.  Procedure                               Abnormality         Status                     ---------                               -----------         ------                     CBC WITH DIFF[[id number]]                Abnormal            Final result               MANUAL DIFF AND  MORPHOLO.Marland KitchenMarland Kitchen[[id number]]  Abnormal            Final result                 Please view results for these tests on the individual orders.   TROPONIN-I   TROPONIN-I       Imaging:   Results for orders placed or performed during the hospital encounter of 03/30/21 (from the past 72 hour(s))   XR AP MOBILE CHEST     Status: None    Narrative    Chest AP portable upright 2:18 PM. Comparison 03/02/2021.  HISTORY: Chest pain, since yesterday      Impression    1. Tips of right jugular dual-lumen catheter are at the cavoatrial junction, unchanged.  2. Heart is enlarged, with pulmonary vascular congestion (some improvement).  3. There are persistent bibasal opacities, consolidative on the left side, evident previously. There has been partial clearing at the left lateral lung base. No new abnormality is seen.      Radiologist location  ID: [id number]         ECG:    Most Recent EKG This Encounter   ECG 12 LEAD- TIME WITH TROPONINS    Collection Time: 03/30/21  2:42 PM   Result Value    Ventricular rate 72    Atrial Rate 72    PR Interval 184    QRS Duration 156    QT Interval 458    QTC Calculation 501    Calculated P Axis 31    Calculated R Axis 121    Calculated T Axis -26    Narrative    Normal sinus rhythm  Right bundle branch block  T wave abnormality, consider lateral ischemia  Abnormal ECG  NO STEMI  REVIEWED BY DR. Beverely Pace  [id number]     Confirmed by Beverely Pace ([id number]), editor [place] Crane, Ralph Leyden [id number]) on 03/30/2021 4:08:20 PM     Consults:    See ED Course.    MDM:    During the patient's stay in the emergency department, the above listed information was used to assist with medical decision making and was reviewed by myself as available for review.    Patient staffed with attending per protocol.   Results discussed with patient.    Patient rechecked and remained stable throughout the emergency department course.   All questions/concerns addressed, and patient agrees with disposition plan.   ED Course as of  03/30/21 1651   Tue Mar 30, 2021   1554 TROPONIN-I(!!): 31  Relatively stable in patient with CKD on dialysis.  Will continue to trend given chest pain.   1554 HGB(!): 7.6  Stable.   1555 XR AP MOBILE CHEST  1. Tips of right jugular dual-lumen catheter are at the cavoatrial junction, unchanged.  2. Heart is enlarged, with pulmonary vascular congestion (some improvement).  3. There are persistent bibasal opacities, consolidative on the left side, evident previously. There has been partial clearing at the left lateral lung base. No new abnormality is seen.   1621 POTASSIUM(!): 5.3   1645 Dr. Arlan Organ accepted the patient.   [facility].  Dr. Adriana Simas states he will take care of orders for dialysis.     Medical Decision Making  Amount and/or Complexity of Data Reviewed  Labs: ordered.  Radiology: ordered. Decision-making details documented in ED Course.  ECG/medicine tests: ordered and independent interpretation performed.      Risk  Prescription drug management.  Parenteral controlled substances.  Decision regarding hospitalization.         Impression:   Diagnoses     Diagnosis Comment Added By Time Added    Chest pain at rest  Dionisio Paschal, PA-C 03/30/2021  4:50 PM    ESRD on dialysis (CMS [facility])  Dionisio Paschal, PA-C 03/30/2021  4:50 PM    Hyperkalemia  Dionisio Paschal, PA-C 03/30/2021  4:50 PM        Disposition:   Admitted     Patient will be admitted to Dr. Salem Senate service for further evaluation and management.        The co-signing faculty was physically present in the emergency department and participated in the care of this patient.  Dionisio Paschal, PA-C  03/30/2021, 16:50

## 2021-03-30 NOTE — Consults (Unsigned)
PATIENT NAME: Max [name], Max [name]   HOSPITAL NUMBER:  [id number]  DATE OF SERVICE: 03/30/2021  DATE OF BIRTH:  05-27-1970    CONSULTATION    NEPHROLOGY CONSULT.    HISTORY:  The patient is from [place] and he is here in this town and he complains of chest pain and shortness of breath.  He is on dialysis Monday, Wednesday, and Friday.  He missed dialysis yesterday.  Potassium is 5.3, BUN is 53, creatinine 7.2.  He has a Chief Technology Officer for dialysis.  He has +1 edema.  Status post right BKA because of diabetic complications.  His hemoglobin is low at 7.6, white count is 13.8.  He was placed on dialysis a few months ago because of diabetes.    HEENT Exam:  PERRLA.  Extraocular motion intact.  Tongue midline.  Neck:  Supple.  No JVD.  Lungs:  Show decreased breath sounds bilaterally.  Heart:    Regular rate and rhythm.  Abdomen:  Soft, nontender.  Bowel sounds are present.  Extremities:  Shows +1 edema.  Status post BKA on the right side.  Neurologic exam:  Cranial nerves are grossly intact.  There is no focal deficit.  Muscle strength is equal bilaterally.  Blood pressure is low at 104/67, pulse is 73.      Chest x-ray was done, shows that he has a PermCath.  He has cardiomegaly.  He has pulmonary vascular congestion.  He has a past medical history of hypertension, diabetes, coronary artery disease.  He has 12 stents.  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    HOME MEDICATIONS:  1. Aspirin.  2. Colace.  3. Cymbalta.  4. Nexium.  5. Zetia.  6. Insulin.  7. Imdur.  8. MiraLax.  9. Renagel.  10. Carafate.  11. Demadex.  12. Coumadin.    ALLERGIES:  1. ULTRAM.  2. BENADRYL.  3. PENICILLIN.    PAST MEDICAL HISTORY:   CHF, diabetes, hypertension, coronary artery disease, end-stage renal disease, coronary artery angioplasty with 12 stents, status post BKA.  He denies smoking or alcohol.    FAMILY HISTORY:  Coronary artery disease, cancer.    DIAGNOSTIC DATA:  Sodium 133, potassium 5.3, hemoglobin 7.6.    PLAN:  Plan is to proceed with  dialysis today.  Try to remove some fluids.  Blood pressure is low so we have to be careful.  Potassium is 5.3.  We will do dialysis also tomorrow since he is on Monday, Wednesday, Friday schedule.  He has end-stage renal disease because of diabetes and hypertension.  Blood pressure on the low side.  He has severe coronary artery disease history with 12 stents and history of heart attack in the past.  Troponin is mildly elevated at 31, albumin is 2.8.        Rondel Oh, DO                DD:  03/30/2021 17:04:06  DT:  03/30/2021 18:35:17 RAF  D#:  [id number]

## 2021-03-30 NOTE — Nurses Notes (Signed)
Patient arrived to floor via wheelchair.  Patient has below the knee amputation on right leg- knee has small wound that is covered with bandage and some drainage.  On 4L oxygen nasal cannula.  Has small healed wound to left elbow.  Has a few scabs to lower left leg.  Lower left leg has non pitting edema.  Patient has belongings with him.  Refuses to have wallet locked up.  Telemetry box on and working.  Scheduled for dialysis this evening.  Offered dinner from cafeteria and [place] sandwich and did not want those.  Said he would eat the crackers and snacks in the bag he brought.  Call light within reach.

## 2021-03-30 NOTE — Nurses Notes (Signed)
Set up and tested equipment per policy and order, which includes RTD 3 hours on 3251 bath and remove 2 kg as tolerated by pt; BFR 350, DFR 600, Na 140, bicarb 40; report received and pt in transport at or about 1910 to come to HDU; pt arrived at or about 1940 and was assessed and found to be stable for HD, and to have a RIJ HD catheter which was aseptically accessed per p&p and noted to have adequate push/pull to run HD tx;    Tx initiated at [place] and per order epogen 11,000 units and zemplar 2 mcg was adm during HD; pt completed his tx w/o issue and achieved the ordered target w/o complication; all blood was rinsed back and each lumen was flushed w 10 mL of 0.9% NS, locked with heparin 1000:1 per order (IC-A 1.6 mL and IC-V 1.6 mL), capped and clamped; pt was assessed post HD and found to be stable and was placed in transport to return to his room; at the time of departure he was stable and left in the care of hospital staff.

## 2021-03-30 NOTE — H&P (Signed)
[facility]  H&P    Max [name]. 51 y.o. male 311/B   Date of Service: 03/30/2021    Date of Admission:  03/30/2021   Azzie Roup, MD Full Code       Chief Complaint:  Chest pain radiating down left arm  HPI:   Max [name]. is a 51 y.o., White male who presents with a history of chronic kidney disease on dialysis 3 days a week, congestive heart failure, coronary artery disease, type 2 diabetes mellitus, and hypertension.  He presents with onset of chest pain in the substernal area with radiation down his left arm to his fingers with associated shortness of breath.  He denies nausea or diaphoresis.  He does have a significant cardiac history and has had multiple stents in the past.  He has missed several episodes of dialysis.  In the emergency room his white count is elevated at 13.8 and his hemoglobin is 7.6 with hematocrit 25.6.  His INR is 1.63 and he has been holding his Coumadin secondary to blood being "too thin".  His troponin was 31, 29 in 33.  Chest x-ray shows his heart to be enlarged with pulmonary vascular congestion showing some improvement over prior x-rays.  There were persistent bibasilar opacities and partial clearing of the left lateral lung base with no new abnormalities noted.  EKG shows normal sinus rhythm with T-wave abnormalities-consider lateral ischemia.  Patient is currently wearing 2 L nasal cannula.  Patient will be admitted, cardiology will be consulted regarding his chest pain and known coronary artery disease.  Dr. Bayard Males will see him and initiate his dialysis.  We will continue his home medications.  It may be reasonable to get wound care to look at his right leg (stump) where he has a callus appearing area.    ROS:   GENERAL: DENIES f/c, wt loss/gain, n/v  HEENT: DENIES blurriness/dizziness, vision/hearing loss, discharge, epistaxis, difficulty swallowing, rhinorrhea, congestion or sore throat  CARDIAC:  Patient complains of chest pain as mentioned  above  RESPIRATORY: DENIES SoB, cough/sputum production, wheezing, hemoptysis  GI: DENIES n/v/d/c, abdo pain, appetite changes, heartburn, bloating, changes in BM  GU: DENIES  urgency, frequency, hesitancy, incontinence, dysuria, changes in menses, discharge, lesions  BREAST: DENIES swelling, tenderness, discharge, masses/nodules, skin changes  RECTAL: DENIES bright red blood, maroon stools, itching/burning, incontinence  ENDOCRINE: DENIES heat/cold intolerance, polyuria, polydipsia, excessive sweating,   MSK: DENIES joint swelling/pain, decreased ROM, weakness, cramping, BLE edema, varicoses, claudication  SKIN: DENIES  rashes, lesions, bruising  NEUROLOGIC: DENIES numbness/tingling, loss of sensation/motor function, weakness, slurred speech.   PSYCH: DENIES changes in mood, memory, sleep, interests.         PMHx:    Past Medical History:   Diagnosis Date    Arthropathy     CKD (chronic kidney disease), stage III (CMS HCC) 08/25/2019    Congestive heart failure (CMS HCC)     Coronary artery disease     Diabetes mellitus, type 2 (CMS HCC)     Headache     HTN (hypertension)     Hx MRSA infection               PSHx:    Past Surgical History:   Procedure Laterality Date    AMPUTATION FOOT / TOE Left     CORONARY ARTERY ANGIOPLASTY      HX CATARACT REMOVAL Bilateral               Allergies:  Allergies   Allergen Reactions    Toradol [Ketorolac] Rash    Ultram [Tramadol] Rash    Benadryl [Diphenhydramine]  Other Adverse Reaction (Add comment)     irritable    Penicillin G Benethamine         Home Medications:    Prior to Admission medications    Medication Sig Start Date End Date Taking? Authorizing Provider   amLODIPine (NORVASC) 10 mg Oral Tablet Take 1 Tablet (10 mg total) by mouth Once a day    Provider, Historical   aspirin (ECOTRIN) 81 mg Oral Tablet, Delayed Release (E.C.) Take 1 Tablet (81 mg total) by mouth Once a day    Provider, Historical   atorvastatin (LIPITOR) 80 mg Oral Tablet Take 1 Tablet (80 mg  total) by mouth Every evening    Provider, Historical   bacitracin 500 unit/gram Packet Apply 1 Packet topically Twice per day as needed for Other 03/09/21   Avel Peace, MD   carvediloL (COREG) 25 mg Oral Tablet Take 1 Tablet (25 mg total) by mouth Twice daily with food    Provider, Historical   collagenase ointment (SANTYL) 250 unit/gram Ointment Apply topically Once a day for 12 doses 03/10/21 03/22/21  Avel Peace, MD   cyclobenzaprine (FLEXERIL) 5 mg Oral Tablet Take 1 Tablet (5 mg total) by mouth Three times a day as needed for Muscle spasms 03/09/21   Avel Peace, MD   docusate sodium (COLACE) 100 mg Oral Capsule Take 1 Capsule (100 mg total) by mouth Twice daily    Provider, Historical   DULoxetine (CYMBALTA DR) 60 mg Oral Capsule, Delayed Release(E.C.) Take 1 Capsule (60 mg total) by mouth Once a day    Provider, Historical   esomeprazole magnesium (NEXIUM) 20 mg Oral Capsule, Delayed Release(E.C.) Take 1 Capsule (20 mg total) by mouth Every [name] before breakfast    Provider, Historical   ezetimibe (ZETIA) 10 mg Oral Tablet Take 1 Tablet (10 mg total) by mouth Every evening    Provider, Historical   ferrous sulfate (FERATAB) 324 mg (65 mg iron) Oral Tablet, Delayed Release (E.C.) Take 1 Tablet (324 mg total) by mouth    Provider, Historical   gabapentin (NEURONTIN) 400 mg Oral Capsule Take 1 Capsule (400 mg total) by mouth Three times a day    Provider, Historical   insulin glargine-yfgn 100 unit/mL Subcutaneous Inject 8 Units under the skin Once a day 03/10/21   Avel Peace, MD   insulin lispro 100 units/mL Subcutaneous Injectable Inject 0-12 Units under the skin Every 4 hours as needed for Other 03/09/21   Avel Peace, MD   isosorbide mononitrate (IMDUR) 60 mg Oral Tablet Sustained Release 24 hr Take 1 Tablet (60 mg total) by mouth Every [name]    Provider, Historical   lidocaine (LIDODERM) 5 % Adhesive Patch, Medicated Place 1 Patch (700 mg total) on the skin Once a day 03/09/21   Avel Peace, MD   oxyCODONE  (ROXICODONE) 5 mg Oral Tablet Take 1 Tablet (5 mg total) by mouth Every 6 hours as needed 03/09/21   Avel Peace, MD   polyethylene glycol (MIRALAX) 17 gram Oral Powder in Packet Take 1 Packet (17 g total) by mouth Once a day 03/10/21   Avel Peace, MD   ranolazine (RANEXA) 500 mg Oral Tablet Sustained Release 12 hr Take 1 Tablet (500 mg total) by mouth Twice daily    Provider, Historical   sevelamer (RENAGEL) 800 mg Oral Tablet Take 1 Tablet (800 mg total)  by mouth Three times daily with meals    Provider, Historical   sodium zirconium cyclosilicate (LOKELMA) 10 gram Oral Powder in Packet Take 1 Packet (10 g total) by mouth Once a day    Provider, Historical   sucralfate (CARAFATE) 1 gram Oral Tablet Take 1 Tablet (1 g total) by mouth Three times daily before meals    Provider, Historical   torsemide (DEMADEX) 20 mg Oral Tablet Take 1 Tablet (20 mg total) by mouth Twice daily    Provider, Historical   Warfarin (COUMADIN) 3 mg Oral Tablet Take 1 Tablet (3 mg total) by mouth Once a day 03/09/21   Avel Peace, MD   cyclobenzaprine (FLEXERIL) 5 mg Oral Tablet Take 1 Tablet (5 mg total) by mouth Three times a day  03/09/21  Provider, Historical   HYDROcodone-acetaminophen (NORCO) 5-325 mg Oral Tablet Take 1 Tablet by mouth Every 8 hours as needed for Pain  03/09/21  Provider, Historical   insulin glargine 100 unit/mL Subcutaneous injection (vial) Inject 25 Units under the skin Twice daily - in [name] and at bedtime  03/09/21  Provider, Historical   midodrine (PROAMITINE) 5 mg Oral Tablet Take 1 Tablet (5 mg total) by mouth Three times a day  03/09/21  Provider, Historical   spironolactone (ALDACTONE) 50 mg Oral Tablet Take 1 Tablet (50 mg total) by mouth Every [name] with breakfast  03/09/21  Provider, Historical   ticagrelor (BRILINTA) 90 mg Oral Tablet Take 1 Tablet (90 mg total) by mouth Twice daily  03/09/21  Provider, Historical   Warfarin (COUMADIN) 4 mg Oral Tablet Take 1 Tablet (4 mg total) by mouth Once a day  03/09/21   Provider, Historical            Family History  Family Medical History:       Problem Relation (Age of Onset)    Cancer Father    Coronary Artery Disease Mother, Father            Social History  Social History     Tobacco Use    Smoking status: Never    Smokeless tobacco: Never   Vaping Use    Vaping Use: Never used   Substance Use Topics    Alcohol use: Never    Drug use: Never          PHYSICAL EXAM:  VITALS: BP 104/71   Pulse 73   Temp 36.7 C (98.1 F)   Resp 16   Ht 1.727 m (5\' 8" )   Wt 86.2 kg (190 lb)   SpO2 98%   BMI 28.89 kg/m         GENERAL: Pt is a pleasant, well-nourished, well-developed 51 y.o. male who is resting comfortably in bed in NAD. Appears stated age  50:  head normocephalic, symmetrical facies. EOM intact b/l. PERRLA. Sclera non-icteric, non-injected. No rhinorrhea. Oropharyngeal mucous membranes are moist w/o erythema/exudates   NECK: supple. No masses, lymphadenopathy, JVD, or carotid bruits on exam. Trachea midline, no thyromegaly   CV: RRR. No murmurs, rubs, gallops.   LUNGS: CTAB, No rhonchi, rales, wheezes.   BREAST: deferred   RECTAL: deferred  GI: (+) BS in all 4 quadrants. soft, NT/ND. No rigidity/guarding/rebound. No organomegaly/masses, or abdominal bruits  GU: deferred  MSK: full AROM. No joint effusions/swelling or deformities. No BLE edema, clubbing, or cyanosis. 2+ pulses present b/l Gait not assessed at this time.  SKIN: No significant lesions, rashes, ecchymoses noted.   NEURO: AAOx4, CN grossly intact. Sensation  intact b/l. No focal deficits.  PSYCH: Mood, behavior and affect normal w/ Intact judgement & insight     CBC Results Coag Results   Recent Labs     03/08/21  0426 03/09/21  0425 03/28/21  2324 03/30/21  1516   WBC 12.7* 11.1* 12.3* 13.8*   HGB 7.5* 7.0* 7.8* 7.6*   HCT 24.8* 22.6* 23.8* 25.6*   PLTCNT 454* 490* 506* 496*     Recent Labs     03/07/21  0324 03/08/21  0426 03/09/21  0425 03/28/21  2324 03/30/21  1516   RBC 2.56* 2.72* 2.51* 2.84* 2.88*    HCT 23.3* 24.8* 22.6* 23.8* 25.6*   HGB 7.1* 7.5* 7.0* 7.8* 7.6*   WBC 12.4* 12.7* 11.1* 12.3* 13.8*   MCHC 30.5* 30.2* 31.0 32.9 29.7*   MCH 27.7 27.6 27.9 27.6 26.4   RDW  --   --   --  15.6*  --    MPV 10.0 9.9 9.9  --  9.6   MCV 91.0 91.2 90.0 83.8 88.9   PLTCNT 464* 454* 490* 506* 496*    Recent Labs     03/22/21  1130 03/24/21  0300 03/28/21  2246 03/30/21  1516   PROTHROMTME 36.9* 34.0* 22.6* 19.5*   INR 4.04* 3.70* 2.39 1.63     Recent Labs     03/22/21  1130 03/24/21  0300 03/28/21  2245 03/28/21  2246 03/30/21  1516   APTT  --   --  49.2*  --  52.6*   INR 4.04* 3.70*  --  2.39 1.63   PROTHROMTME 36.9* 34.0*  --  22.6* 19.5*        BMP Results ABG Results   Recent Labs     03/06/21  0438 03/06/21  0645 03/07/21  0324 03/07/21  0609 03/08/21  0426 03/08/21  0623 03/08/21  1605 03/08/21  2105 03/09/21  0425 03/09/21  0612 03/09/21  1154 03/17/21  1400 03/22/21  1130 03/28/21  2246 03/30/21  1230 03/30/21  1516   SODIUM 134*  --  130*  --  133*  --   --   --  132*  --   --    < > 134* 135* 135* 133*   POTASSIUM 5.3*  --  5.7*  --  6.0*  --   --   --  4.9  --   --    < > 3.1* 4.1 5.7* 5.3*   CHLORIDE 102  --  100  --  102  --   --   --  100  --   --    < > 96 98 99 96   CALCIUM 8.8  --  8.8  --  8.7  --   --   --  8.6  --   --    < > 9.1 9.5 9.3 8.7   TOTALPROTEIN  --   --   --   --   --   --   --   --   --   --   --   --   --  9.2*  --  8.2   MAGNESIUM 1.6*  --  1.6*  --  1.5*  --   --   --  1.9  --   --   --   --   --   --   --    PHOSPHORUS 3.6  --  4.1  --  4.7  --   --   --  3.6  --   --   --   --   --   --   --    ALBUMIN  --   --   --   --   --   --   --   --   --   --   --   --   --  2.6*  --  2.8*   CO2 24  --  22  --  22  --   --   --  25  --   --    < > 29 26 24 22    BUN 35*  --  48*  --  61*  --   --   --  39*  --   --    < > 12 36* 54* 53*   CREATININE 2.63*  --  3.38*  --  3.72*  --   --   --  2.77*  --   --    < > 2.69* 5.74* 7.36* 7.26*   GLUCOSENF 116  --  148*  --  127*  --   --   --  209*   --   --    < > 133* 137* 198* 93   GLUIP  --    < >  --    < >  --    < > 153* 185*  --  255* 144*  --   --   --   --   --     < > = values in this interval not displayed.    Recent Labs     03/02/21  0555 03/03/21  0030 03/22/21  1130 03/28/21  2246 03/30/21  1230 03/30/21  1516   FI02 21.0  --   --   --   --   --    PH 7.38  --   --   --   --   --    PCO2 45  --   --   --   --   --    PO2 47  --   --   --   --   --    BICARBONATE 25.3  --   --   --   --   --    BASEEXCESS 1.0  --   --   --   --   --    POTASSIUM  --    < > 3.1* 4.1 5.7* 5.3*    < > = values in this interval not displayed.        Liver Test Results Cardiac Results   Recent Labs     03/28/21  2246 03/30/21  1516   TOTBILIRUBIN 0.6 0.6   BILIRUBINCON 0.3  --    AST 12 10   ALT 5* 7*   ALKPHOS 281* 322*    Recent Labs     03/28/21  2246 03/29/21  0150 03/30/21  1440 03/30/21  1806   CPK 29*  --   --   --    TROPONINI 33* 29 31* 35*        Pregnancy Test Results              Imaging:  TRANSTHORACIC ECHOCARDIOGRAM - ADULT    Result Date: 03/02/2021  **See full report in linked PDF document**  Transthoracic Echocardiographic Report           ______________________________________________________________________________ Name: JAFARI, MCKILLOP                     MRN: [id number]               Weight: 213 lb Study Date: 03/02/2021 02:19 PM              DOB: [birth year]             Height: 67.5 in Gender: Male                                 Age: 53 yrs                 BSA: 2.1 m2 Accession #: [id number]                   BP: 115/78 mmHg Patient Location: [facility] [place] Ordering Provider: Avel Peace Tech: Shyrell [place]           ______________________________________________________________________________ Procedure:  Transthoracic complete echo with contrast, 2D, spectral and tissue Doppler, color flow Doppler, M-mode. Quality:  The study images were of technically  adequate quality. Indications: CAD (coronary artery disease),Heart failure (CMS HCC) Conclusions: Normal LV size and systolic function. LVEF estimated at 55.2% by BP Simpson method. Basal and mid inferolateral wall HK. Concentric LV hypertrophy is present. Grade III diastolic dysfunction with elevated LAP. Normal RV size and systolic function. Mild bi-atrial dilation. No significant VHD. No TR jet to estimate RVSP. Elevated RAP at 15 mmHg. Normal Ao R size. Poor view of Asc Ao. No pericardial effusion. No prior study for comparison. Findings Left Ventricle:   Normal left ventricular size. Concentric hypertrophy. Left ventricular systolic function is normal. Basal and mid inferolateral wall hypokinesis. Grade III diastolic dysfunction with elevated LAP. Right Ventricle:   The right ventricle is not well visualized. Right ventricular systolic function cannot be assessed. RV systolic pressure is consistent with mild pulmonary hypertension. Left Atrium:   Mildly dilated left atrium. Right Atrium:   The right atrium is mildly dilated. Mitral Valve:   Mitral valve leaflets appear mildly thickened. No evidence of mitral stenosis. There is mild mitral regurgitation. Tricuspid Valve:   There is mild tricuspid regurgitation. Aortic Valve:   Trileaflet aortic valve. The aortic cusps appear mildly thickened. No Aortic valve stenosis. Pulmonic Valve:   The pulmonic valve is not well visualized. Atrial Septum:   The interatrial septum is normal in appearance. IVC/Hepatic Veins:   Dilated IVC with <50% inspiratory collapse (estimated RA pressure: 15 mmHg). Aorta:   The aortic root is of normal size. Pericardium/Pleural space:   Normal pericardium with no pericardial effusion. Electronically signed by: Ward Chatters on 03/02/2021 03:51 PM     XR CHEST AP    Result Date: 03/02/2021  Armen Pickup JR. Male, 51 years old. XR CHEST AP performed on 03/02/2021 6:28 AM. REASON FOR EXAM:  Dyspnea TECHNIQUE: 1 views/1 images submitted for  interpretation. COMPARISON:  02/28/2021 FINDINGS:  Right IJ central venous catheter is stable in positioning with tip at the right atrium. Stable cardiomegaly. Worsening right infrahilar opacities and persistent left retrocardiac opacities. There is progressive left basilar fluid and atelectasis/consolidation. Persistent retrocardiac opacities.     Worsening right infrahilar opacities and persistent left retrocardiac opacities. Progressive left basilar fluid and atelectasis/consolidation.    XR SHOULDER LEFT  Result Date: 03/01/2021  BENETT SWOYER. Male, 51 years old. XR SHOULDER LEFT performed on 02/28/2021 5:20 PM. REASON FOR EXAM:  assess for fracture or dislocation TECHNIQUE: 3 views/3 images submitted for interpretation. COMPARISON: CT chest abdomen pelvis 02/27/2021. FINDINGS: No acute fracture or traumatic malalignment. Moderate degenerative arthrosis of the acromioclavicular joint and mild degenerative arthrosis of the glenohumeral joint. A right IJ dialysis catheter is partially visualized.     1.No acute osseous abnormality. 2.Moderate degenerative arthrosis of the acromioclavicular joint and mild degenerative arthrosis of the glenohumeral joint.     XR AP MOBILE CHEST    Result Date: 03/30/2021  Chest AP portable upright 2:18 PM. Comparison 03/02/2021. HISTORY: Chest pain, since yesterday     1. Tips of right jugular dual-lumen catheter are at the cavoatrial junction, unchanged. 2. Heart is enlarged, with pulmonary vascular congestion (some improvement). 3. There are persistent bibasal opacities, consolidative on the left side, evident previously. There has been partial clearing at the left lateral lung base. No new abnormality is seen. Radiologist location ID: [id number]     XR AP MOBILE CHEST    Result Date: 03/29/2021  Max [name]. RADIOLOGIST: Betsey Amen, MD XR AP MOBILE CHEST performed on 03/28/2021 11:02 PM CLINICAL HISTORY: chest pain, dialysis. Chest Pain TECHNIQUE: Frontal view of the  chest. COMPARISON:  2123 FINDINGS: Right IJ dialysis catheter in place whose tip is in the SVC. Heart size is mildly enlarged.   Mild pulmonary vascular congestion. No pneumothorax. No large effusion.     As above Radiologist location ID: [place] (INCLUDING ABIS)-LOWER (PVR)    Result Date: 03/08/2021  Procedure: A multilevel physiologic examination of the lower extremity arteries was performed using a four cuff technique and continuous wave Doppler to acquire pulse volume recording and segmental limb pressure measurements for evaluation of peripheral arterial disease. Bilateral: RT BKA                            Left ABI: 0.971. Right BKA2. Normal pulse volume recording (PVR) at rest in the left lower extremity. 3. Normal toe pressure/s (>31mmHg) at rest in the left lower extremity suggesting adequate wound healing potential.  Conclusions: 1. Normal pulse volume recording (PVR) at rest left lower extremityand normal toe pressures suggestive of good wound healing potential.2. Right BKA    ECG 12-LEAD    Result Date: 03/09/2021  Normal sinus rhythm Right bundle branch block Abnormal ECG Confirmed by Stefan Church ([id number]) on 03/09/2021 10:23:33 AM    ECG 12-LEAD    Result Date: 03/01/2021  Sinus bradycardia with sinus arrhythmia Possible Left atrial enlargement Right bundle branch block Septal infarct , age undetermined T wave abnormality, consider inferior ischemia Abnormal ECG When compared with ECG of 28-Feb-2021 12:11, Vent. rate has decreased BY  36 BPM Septal infarct is now Present QT has shortened Confirmed by CACCAMO DO, MARCO ([id number]) on 03/01/2021 7:47:25 AM    ECG 12 LEAD    Result Date: 03/30/2021  Normal sinus rhythm Possible Left atrial enlargement Right bundle branch block T wave abnormality, consider inferolateral ischemia Abnormal ECG When compared with ECG of 27-Feb-2021 21:18, Sinus rhythm has replaced Junctional rhythm QRS axis shifted left    ECG 12 LEAD- TIME WITH  TROPONINS    Result Date: 03/30/2021  Normal sinus rhythm Right bundle branch block T wave abnormality, consider lateral ischemia Abnormal ECG NO STEMI REVIEWED BY DR. Anderson Malta  AUXIER  [id number]  Confirmed by Beverely Pace ([id number]), editor [place] Ponder [phone]) on 03/30/2021 4:08:20 PM        Inpatient Medications:  amLODIPine (NORVASC) tablet, 10 mg, Oral, Daily  aspirin chewable tablet 81 mg, 81 mg, Oral, Daily  atorvastatin (LIPITOR) tablet, 80 mg, Oral, QPM  [START ON 03/31/2021] carvedilol (COREG) tablet, 25 mg, Oral, 2x/day-Food  [START ON 03/31/2021] correctional insulin lispro (HUMALOG) 100 units/mL injection, 2-10 Units, Subcutaneous, 3x/day AC  cyclobenzaprine (FLEXERIL) tablet, 5 mg, Oral, 3x/day PRN  docusate sodium (COLACE) capsule, 100 mg, Oral, 2x/day  DULoxetine (CYMBALTA) delayed release capsule, 60 mg, Oral, Daily  epoetin alfa (PROCRIT) 20,000 units/mL injection, 150 Units/kg (Adjusted), Intravenous, Give in Dialysis  ezetimibe (ZETIA) tablet, 10 mg, Oral, QPM  ferrous sulfate 325 mg (elemental IRON 65 mg) tablet, 325 mg, Oral, Daily  gabapentin (NEURONTIN) capsule, 400 mg, Oral, 3x/day  heparin 1,000 units/mL injection IV bolus, 1-5 mL, Intercatheter, Dialysis x1 PRN  heparin 1,000 units/mL injection IV bolus, 1-5 mL, Intercatheter, Dialysis x1 PRN  insulin glargine-yfgn 100 Units/mL SubQ pen, 8 Units, Subcutaneous, 2x/day  [START ON 03/31/2021] isosorbide mononitrate (IMDUR) 24 hr extended release tablet, 60 mg, Oral, QAM  magnesium hydroxide (MILK OF MAGNESIA) 400mg  per 15mL oral liquid, 15 mL, Oral, Daily PRN  NS flush syringe, 3 mL, Intracatheter, Q8HRS  NS flush syringe, 3 mL, Intracatheter, Q1H PRN  NS flush syringe, 3 mL, Intracatheter, Q8HRS  NS flush syringe, 3 mL, Intracatheter, Q1H PRN  ondansetron (ZOFRAN) 2 mg/mL injection, 4 mg, Intravenous, Q6H PRN  oxyCODONE (ROXICODONE) immediate release tablet, 5 mg, Oral, Q6H PRN  [START ON 03/31/2021] pantoprazole (PROTONIX) delayed release tablet, 40 mg,  Oral, Daily  paricalcitol (ZEMPLAR) 5 mcg/mL injection, 2 mcg, Intravenous, Give in Dialysis  polyethylene glycol (MIRALAX) oral packet, 17 g, Oral, Daily  ranolazine (RANEXA) extended release tablet, 500 mg, Oral, 2x/day  [START ON 03/31/2021] sevelamer carbonate (RENVELA) tablet, 800 mg, Oral, 3x/day-Meals  sodium zirconium cyclosilicate (LOKELMA) powder, 10 g, Oral, Daily  [START ON 03/31/2021] sucralfate (CARAFATE) tablet, 1 g, Oral, 3x/day AC  torsemide (DEMADEX) tablet, 20 mg, Oral, 2x/day  warfarin (COUMADIN) tablet, 6 mg, Oral, Once 2100  Warfarin - Pharmacist to Dose per Protocol, , Does not apply, Daily PRN       ______________________________________________________________________  Problem list:  Active Hospital Problems    Diagnosis    Primary Problem: Chest pain at rest    End stage renal disease on dialysis (CMS [facility])    Cardiovascular disease    History of right below knee amputation (CMS HCC)    Hyperkalemia    Anemia       Plan:  1. Chest pain at rest consistent with angina in patient with known coronary disease  Serial troponins  Cardiology has been consulted for their evaluation recommendations    2. End-stage renal disease on dialysis having missed several sessions of dialysis  Nephrology consulted for initiation of inpatient dialysis    3. Cardiovascular disease  Patient has been off of his Coumadin since his blood was "too thin".  Will have pharmacy dose his Coumadin    4. History of right below-knee amputation  Continue wound care    5. Hyperkalemia  Should be rectified with dialysis    6. Anemia of chronic disease    Paula Compton, MD

## 2021-03-30 NOTE — Nurses Notes (Signed)
I HAVE REVIEWED AND AGREE WITH JENNIFER VALENTINE RN DOCUMENTATION.

## 2021-03-30 NOTE — Care Management Notes (Signed)
Patient presents to the ED with complaints of chest pain. Will follow.

## 2021-03-30 NOTE — ED Triage Notes (Signed)
Pt was seen in [facility] ED yesterday for chest pain. Pt was supposed to go to [facility]. Pt signed out AMA because he did not want to go to [place]. Due to being in [place] yesterday pt did not go to his dialysis treatment. Pt attends dialysis on M/W/F schedule. Pt is in the ED today because he did not want to go to [facility] again. Pt is still having chest pain.

## 2021-03-31 DIAGNOSIS — R079 Chest pain, unspecified: Secondary | ICD-10-CM

## 2021-03-31 DIAGNOSIS — I2510 Atherosclerotic heart disease of native coronary artery without angina pectoris: Secondary | ICD-10-CM

## 2021-03-31 DIAGNOSIS — E1151 Type 2 diabetes mellitus with diabetic peripheral angiopathy without gangrene: Secondary | ICD-10-CM

## 2021-03-31 DIAGNOSIS — N186 End stage renal disease: Secondary | ICD-10-CM

## 2021-03-31 DIAGNOSIS — I5030 Unspecified diastolic (congestive) heart failure: Secondary | ICD-10-CM

## 2021-03-31 DIAGNOSIS — R9431 Abnormal electrocardiogram [ECG] [EKG]: Secondary | ICD-10-CM

## 2021-03-31 DIAGNOSIS — I25118 Atherosclerotic heart disease of native coronary artery with other forms of angina pectoris: Secondary | ICD-10-CM

## 2021-03-31 DIAGNOSIS — Z89511 Acquired absence of right leg below knee: Secondary | ICD-10-CM

## 2021-03-31 DIAGNOSIS — I110 Hypertensive heart disease with heart failure: Secondary | ICD-10-CM

## 2021-03-31 DIAGNOSIS — Z794 Long term (current) use of insulin: Secondary | ICD-10-CM

## 2021-03-31 DIAGNOSIS — D631 Anemia in chronic kidney disease: Secondary | ICD-10-CM

## 2021-03-31 LAB — CBC WITH DIFF
BASOPHIL #: <0.1 10*3/uL (ref <=0.20)
BASOPHIL %: 1 %
EOSINOPHIL #: 0.25 10*3/uL (ref <=0.50)
EOSINOPHIL %: 2 %
HCT: 26.1 % — ABNORMAL LOW (ref 38.9–52.0)
HGB: 7.8 g/dL — ABNORMAL LOW (ref 13.4–17.5)
IMMATURE GRANULOCYTE #: <0.1 10*3/uL (ref <0.10)
IMMATURE GRANULOCYTE %: 1 % (ref 0–1)
LYMPHOCYTE #: 1.58 10*3/uL (ref 1.00–4.80)
LYMPHOCYTE %: 15 %
MCH: 26.3 pg (ref 26.0–32.0)
MCHC: 29.9 g/dL — ABNORMAL LOW (ref 31.0–35.5)
MCV: 87.9 fL (ref 78.0–100.0)
MONOCYTE #: 2.21 10*3/uL — ABNORMAL HIGH (ref 0.20–1.10)
MONOCYTE %: 20 %
MPV: 9.4 fL (ref 8.7–12.5)
NEUTROPHIL #: 6.66 10*3/uL (ref 1.50–7.70)
NEUTROPHIL %: 61 %
PLATELETS: 474 10*3/uL — ABNORMAL HIGH (ref 150–400)
RBC: 2.97 10*6/uL — ABNORMAL LOW (ref 4.50–6.10)
RDW-CV: 14.9 % (ref 11.5–15.5)
WBC: 10.8 10*3/uL (ref 3.7–11.0)

## 2021-03-31 LAB — COMPREHENSIVE METABOLIC PANEL, NON-FASTING
ALBUMIN: 2.6 g/dL — ABNORMAL LOW (ref 3.5–5.0)
ALKALINE PHOSPHATASE: 336 U/L — ABNORMAL HIGH (ref 45–115)
ALT (SGPT): 8 U/L — ABNORMAL LOW (ref 10–55)
ANION GAP: 12 mmol/L (ref 4–13)
AST (SGOT): 10 U/L (ref 8–45)
BILIRUBIN TOTAL: 0.6 mg/dL (ref 0.3–1.3)
BUN/CREA RATIO: 7 (ref 6–22)
BUN: 33 mg/dL — ABNORMAL HIGH (ref 8–25)
CALCIUM: 8.6 mg/dL (ref 8.5–10.0)
CHLORIDE: 98 mmol/L (ref 96–111)
CO2 TOTAL: 27 mmol/L (ref 22–30)
CREATININE: 4.94 mg/dL — ABNORMAL HIGH (ref 0.75–1.35)
ESTIMATED GFR: 14 mL/min/BSA — ABNORMAL LOW (ref >=60)
GLUCOSE: 141 mg/dL — ABNORMAL HIGH (ref 65–125)
POTASSIUM: 5 mmol/L (ref 3.5–5.1)
PROTEIN TOTAL: 7.9 g/dL (ref 6.4–8.3)
SODIUM: 137 mmol/L (ref 136–145)

## 2021-03-31 LAB — ECG 12 LEAD
Atrial Rate: 73 {beats}/min
Calculated P Axis: 25 degrees
Calculated R Axis: 100 degrees
Calculated R Axis: 102 degrees
Calculated R Axis: 108 degrees
Calculated T Axis: -17 degrees
Calculated T Axis: -28 degrees
PR Interval: 184 ms
PR Interval: 182 ms
QRS Duration: 156 ms
QRS Duration: 152 ms
QT Interval: 462 ms
QT Interval: 470 ms
QTC Calculation: 508 ms
QTC Calculation: 497 ms
QTC Calculation: 498 ms
Ventricular rate: 73 {beats}/min
Ventricular rate: 74 {beats}/min
Ventricular rate: 77 {beats}/min

## 2021-03-31 LAB — CBC
HCT: 23.3 % — ABNORMAL LOW (ref 38.9–52.0)
HGB: 7.2 g/dL — ABNORMAL LOW (ref 13.4–17.5)
MCH: 26.9 pg (ref 26.0–32.0)
MCHC: 30.9 g/dL — ABNORMAL LOW (ref 31.0–35.5)
MCV: 86.9 fL (ref 78.0–100.0)
MPV: 9.4 fL (ref 8.7–12.5)
PLATELETS: 467 10*3/uL — ABNORMAL HIGH (ref 150–400)
RBC: 2.68 10*6/uL — ABNORMAL LOW (ref 4.50–6.10)
RDW-CV: 14.9 % (ref 11.5–15.5)
WBC: 10.8 10*3/uL (ref 3.7–11.0)

## 2021-03-31 LAB — MAGNESIUM: MAGNESIUM: 1.8 mg/dL (ref 1.8–2.6)

## 2021-03-31 LAB — PT/INR
INR: 1.51 (ref <=5.00)
PROTHROMBIN TIME: 18.1 seconds — ABNORMAL HIGH (ref 9.7–13.6)

## 2021-03-31 LAB — TROPONIN-I
TROPONIN I: 32 ng/L — ABNORMAL HIGH (ref <=30)
TROPONIN I: 33 ng/L — ABNORMAL HIGH (ref <=30)

## 2021-03-31 LAB — PHOSPHORUS: PHOSPHORUS: 5.1 mg/dL — ABNORMAL HIGH (ref 2.4–4.7)

## 2021-03-31 LAB — POC BLOOD GLUCOSE (RESULTS)
GLUCOSE, POC: 264 mg/dl (ref 80–130)
GLUCOSE, POC: 204 mg/dl (ref 80–130)
GLUCOSE, POC: 184 mg/dl (ref 80–130)
GLUCOSE, POC: 148 mg/dl (ref 80–130)

## 2021-03-31 MED ORDER — WARFARIN 3 MG TABLET
3.0000 mg | ORAL_TABLET | Freq: Once | ORAL | Status: AC
Start: 2021-03-31 — End: 2021-03-31
  Administered 2021-03-31: 3 mg via ORAL
  Filled 2021-03-31: qty 1

## 2021-03-31 MED ORDER — MAGNESIUM OXIDE 400 MG (241.3 MG MAGNESIUM) TABLET
400.0000 mg | ORAL_TABLET | Freq: Two times a day (BID) | ORAL | Status: DC
Start: 2021-03-31 — End: 2021-04-06
  Administered 2021-03-31 – 2021-04-06 (×12): 400 mg via ORAL
  Filled 2021-03-31 (×12): qty 1

## 2021-03-31 NOTE — Care Plan (Signed)
Problem: Adult Inpatient Plan of Care  Goal: Plan of Care Review  Outcome: Ongoing (see interventions/notes)  Goal: Patient-Specific Goal (Individualized)  Outcome: Ongoing (see interventions/notes)  Goal: Absence of Hospital-Acquired Illness or Injury  Outcome: Ongoing (see interventions/notes)  Intervention: Identify and Manage Fall Risk  Recent Flowsheet Documentation  Taken 03/30/2021 2000 by Hinda Glatter, RN  Safety Promotion/Fall Prevention: activity supervised  Intervention: Prevent Skin Injury  Recent Flowsheet Documentation  Taken 03/30/2021 2000 by Hinda Glatter, RN  Body Position: positioned/repositioned independently  Skin Protection: adhesive use limited  Intervention: Prevent and Manage VTE (Venous Thromboembolism) Risk  Recent Flowsheet Documentation  Taken 03/30/2021 2000 by Hinda Glatter, RN  VTE Prevention/Management: anticoagulant therapy maintained  Intervention: Prevent Infection  Recent Flowsheet Documentation  Taken 03/30/2021 2000 by Hinda Glatter, RN  Infection Prevention:   promote handwashing   rest/sleep promoted  Goal: Optimal Comfort and Wellbeing  Outcome: Ongoing (see interventions/notes)  Intervention: Provide Person-Centered Care  Recent Flowsheet Documentation  Taken 03/30/2021 2000 by Hinda Glatter, RN  Trust Relationship/Rapport: care explained  Goal: Rounds/Family Conference  Outcome: Ongoing (see interventions/notes)     Problem: Fall Injury Risk  Goal: Absence of Fall and Fall-Related Injury  Outcome: Ongoing (see interventions/notes)  Intervention: Promote Injury-Free Environment  Recent Flowsheet Documentation  Taken 03/30/2021 2000 by Hinda Glatter, RN  Safety Promotion/Fall Prevention: activity supervised     Problem: Skin Injury Risk Increased  Goal: Skin Health and Integrity  Outcome: Ongoing (see interventions/notes)  Intervention: Optimize Skin Protection  Recent Flowsheet Documentation  Taken 03/30/2021 2000 by Hinda Glatter, RN  Pressure Reduction  Techniques: frequent weight shift encouraged  Pressure Reduction Devices: (M,H,VH) Use Repositioning Devices or Pillows  Skin Protection: adhesive use limited  Head of Bed (HOB) Positioning: HOB elevated     Problem: Chest Pain  Goal: Resolution of Chest Pain Symptoms  Outcome: Ongoing (see interventions/notes)     Problem: Gas Exchange Impaired  Goal: Optimal Gas Exchange  Outcome: Ongoing (see interventions/notes)  Intervention: Optimize Oxygenation and Ventilation  Recent Flowsheet Documentation  Taken 03/30/2021 2000 by Hinda Glatter, RN  Head of Bed [facility]) Positioning: HOB elevated

## 2021-03-31 NOTE — Care Plan (Signed)
Pt had chest pain 9/10 this morning. EKG results were okay and vitals stable. Gave Oxycodone for pain but Pt still complains of pain. Medications administered per physicians order. Stress test scheduled for tomorrow 04/01/21. If results come back fine should be able to go home tomorrow. Safety measures maintained. Will continue to monitor. Education done per care plan. Derald Macleod, GN  03/31/2021, 17:38     Problem: Adult Inpatient Plan of Care  Goal: Plan of Care Review  Outcome: Ongoing (see interventions/notes)  Flowsheets (Taken 03/31/2021 1729)  Progress: improving  Plan of Care Reviewed With: patient  Goal: Patient-Specific Goal (Individualized)  Outcome: Ongoing (see interventions/notes)  Flowsheets (Taken 03/31/2021 1729)  Patient-Specific Goals (Include Timeframe): Stress test for tomorrow 04/01/21

## 2021-03-31 NOTE — Care Plan (Deleted)
Pt had chest pain 9/10 this morning. EKG results were okay and vitals stable. Gave Oxycodone for pain but Pt still complains of pain. Medications administered per physicians order. Stress test scheduled for tomorrow 04/01/21. If results come back fine should be able to go home tomorrow. Safety measures maintained. Will continue to monitor. Education done per care plan. Derald Macleod, GN  03/31/2021, 17:38

## 2021-03-31 NOTE — Nurses Notes (Signed)
Hemodialysis treatment initiated. H2O alarm active. Machine set and tested as ordered. Patient to treatment room. Verbal consent obtained. Assessment completed. Patient denies needs. CVC prepped and accessed. Wasted 5ml blood x 2 ports. Treatment initiated at this time. Blood lines secured to patient. Will continue to monitor patient during treatment.

## 2021-03-31 NOTE — Nurses Notes (Signed)
Hemodialysis treatment completed. Pt tolerated 2 kg fluid removed. Blood returned without difficulty. CVC flushed with saline and locked with heparin. Blue sterile caps applied. Patient denies needs. Report given to RN. Patient to return to floor via transport.

## 2021-03-31 NOTE — Care Management Notes (Signed)
[facility]  Care Management Note    Patient Name: Max [name].  Date of Birth: [birth year]  Sex: male  Date/Time of Admission: 03/30/2021  2:32 PM  Room/Bed: 311/B  Payor: HU[employer]NA MEDICARE / Plan: HU[employer]NA MEDICARE / Product Type: MEDICARE [facility] /    LOS: 0 days   Primary Care Providers:  Azzie Roup, MD, MD (General)    Admitting Diagnosis:  Chest pain at rest [R07.9]    Assessment:   Spoke with patient at bedside regarding discharge planning. Pt reports he is from [facility] and wants to return on discharge. Return referral sent. Pt also reports he is on hemodialysis. Attends [facility] in [place]. Chair time MWF at 5 am. Return referral sent. Possible discharge after stress test tomorrow. Matt, [facility] liaison, notified.    Discharge Plan:  SNF Return (Medicare certified) (code 3)    The patient will continue to be evaluated for developing discharge needs.     Case Manager: Corey Skains, RN  Phone: [phone]

## 2021-03-31 NOTE — Progress Notes (Signed)
PATIENT NAME: Max [name], Max [name]   HOSPITAL NUMBER:  [id number]  DATE OF SERVICE: 03/30/2021  DATE OF BIRTH:  Jul 26, 1970    PROGRESS NOTE    A 51 year old male on dialysis.  He is here with visiting.  We did dialysis yesterday because he had hyperkalemia.  It looks like he is on Monday, Wednesday, Friday treatment, so we are going to do dialysis again today.  Labs are okay now.  He also was volume overloaded yesterday, which we did dialysis and removed 2 kg.  We are going to do dialysis again today.  Labs are stable now.  Vital signs looks good.  Lungs: Essentially clear.  Heart:  Regular rate and rhythm.  Extremities:  No edema.      The patient had a chest x-ray yesterday shows some CHF.  Overall, he is doing better and we will continue home medications.  He is on Carafate, but I am going to stop it because it is contraindicated in kidney dialysis patients.  We will monitor him closely.  Currently, his blood pressure is controlled.  He is on Norvasc.  He is on Coreg.  He is on Renvela as a phosphorus binder.  He has diabetes.  He is on insulin.  He takes Lantus 8 units twice a day.  His glucose levels are stable.        Nalayah Hitt Adriana Simas, DO                DD:  03/31/2021 09:26:37  DT:  03/31/2021 13:26:44 RO  D#:  [id number]

## 2021-03-31 NOTE — Progress Notes (Signed)
.      [facility]  Progress Note    Max [name].  Date of service: 03/31/2021  Date of Admission:  03/30/2021  Hospital Day:  LOS: 0 days     Subjective:  The patient was seen examined same.  No acute events overnight.  Complaining of chest pain but sitting there comfortably playing on his phone.  No other associated symptoms.  Told him will await Cardiology evaluation to see if he can be discharged back to his skilled nursing facility today.    Review of Systems:    A complete review of systems was done and negative unless stated above    Objective:      Vital Signs:  Vitals:    03/30/21 2329 03/31/21 0431 03/31/21 0754 03/31/21 0837   BP: 103/68 107/76 107/80 115/69   Pulse: 77 74 77 77   Resp: 16 15 12 14    Temp: 36.7 C (98.1 F) 36.6 C (97.9 F) 36.7 C (98.1 F) 36.8 C (98.2 F)   SpO2: 94% 96% 94% 99%   Weight:       Height:       BMI:                I/O:  I/O last 24 hours:      Intake/Output Summary (Last 24 hours) at 03/31/2021 1113  Last data filed at 03/31/2021 0900  Gross per 24 hour   Intake 240 ml   Output 2000 ml   Net -1760 ml         amLODIPine (NORVASC) tablet, 10 mg, Oral, Daily  aspirin chewable tablet 81 mg, 81 mg, Oral, Daily  atorvastatin (LIPITOR) tablet, 80 mg, Oral, QPM  carvedilol (COREG) tablet, 25 mg, Oral, 2x/day-Food  correctional insulin lispro (HUMALOG) 100 units/mL injection, 2-10 Units, Subcutaneous, 3x/day AC  cyclobenzaprine (FLEXERIL) tablet, 5 mg, Oral, 3x/day PRN  docusate sodium (COLACE) capsule, 100 mg, Oral, 2x/day  DULoxetine (CYMBALTA) delayed release capsule, 60 mg, Oral, Daily  ezetimibe (ZETIA) tablet, 10 mg, Oral, QPM  ferrous sulfate 325 mg (elemental IRON 65 mg) tablet, 325 mg, Oral, Daily  gabapentin (NEURONTIN) capsule, 400 mg, Oral, 3x/day  heparin 1,000 units/mL injection IV bolus, 1-5 mL, Intercatheter, Dialysis x1 PRN  insulin glargine-yfgn 100 Units/mL SubQ pen, 8 Units, Subcutaneous, 2x/day  isosorbide mononitrate (IMDUR) 24 hr extended  release tablet, 60 mg, Oral, QAM  magnesium hydroxide (MILK OF MAGNESIA) 400mg  per 42mL oral liquid, 15 mL, Oral, Daily PRN  NS flush syringe, 3 mL, Intracatheter, Q8HRS  NS flush syringe, 3 mL, Intracatheter, Q1H PRN  NS flush syringe, 3 mL, Intracatheter, Q8HRS  NS flush syringe, 3 mL, Intracatheter, Q1H PRN  ondansetron (ZOFRAN) 2 mg/mL injection, 4 mg, Intravenous, Q6H PRN  oxyCODONE (ROXICODONE) immediate release tablet, 5 mg, Oral, Q6H PRN  pantoprazole (PROTONIX) delayed release tablet, 40 mg, Oral, Daily  polyethylene glycol (MIRALAX) oral packet, 17 g, Oral, Daily  ranolazine (RANEXA) extended release tablet, 500 mg, Oral, 2x/day  sevelamer carbonate (RENVELA) tablet, 800 mg, Oral, 3x/day-Meals  sodium zirconium cyclosilicate (LOKELMA) powder, 10 g, Oral, Daily  torsemide (DEMADEX) tablet, 20 mg, Oral, 2x/day  Warfarin - Pharmacist to Dose per Protocol, , Does not apply, Daily PRN        Physical Exam:    Constitutional.  Chronically ill middle-aged male looks older than stated age no acute distress   HEENT normocephalic atraumatic extraocular muscle intact   Cardiovascular regular rate rhythm normal S1-S2 no murmurs  rubs or gallops   Respiratory good air movement bilaterally no wheezes rales or rhonchi  GI/GU soft nontender nondistended normal bowel sounds   Musculoskeletal no clubbing or cyanosis   Neuro alert oriented x3 slow to answer but I think that is his baseline   Psych cooperative flat affect    Labs:  Results for orders placed or performed during the hospital encounter of 03/30/21 (from the past 24 hour(s))   TROPONIN-I (SERIAL TROPONINS)   Result Value Ref Range    TROPONIN I 31 (HH) <=30 ng/L   TROPONIN-I (SERIAL TROPONINS)   Result Value Ref Range    TROPONIN I 35 (H) <=30 ng/L   TROPONIN-I (SERIAL TROPONINS)   Result Value Ref Range    TROPONIN I 33 (H) <=30 ng/L   CBC/DIFF    Narrative    The following orders were created for panel order CBC/DIFF.  Procedure                                Abnormality         Status                     ---------                               -----------         ------                     CBC WITH DIFF[[id number]]                Abnormal            Final result               MANUAL DIFF AND MORPHOLO.Marland KitchenMarland Kitchen[[id number]]  Abnormal            Final result                 Please view results for these tests on the individual orders.   COMPREHENSIVE METABOLIC PANEL, NON-FASTING   Result Value Ref Range    SODIUM 133 (L) 136 - 145 mmol/L    POTASSIUM 5.3 (H) 3.5 - 5.1 mmol/L    CHLORIDE 96 96 - 111 mmol/L    CO2 TOTAL 22 22 - 30 mmol/L    ANION GAP 15 (H) 4 - 13 mmol/L    BUN 53 (H) 8 - 25 mg/dL    CREATININE 7.26 (H) 0.75 - 1.35 mg/dL    BUN/CREA RATIO 7 6 - 22    ESTIMATED GFR 9 (L) >=60 mL/min/BSA    ALBUMIN 2.8 (L) 3.5 - 5.0 g/dL     CALCIUM 8.7 8.5 - 10.0 mg/dL    GLUCOSE 93 65 - 125 mg/dL    ALKALINE PHOSPHATASE 322 (H) 45 - 115 U/L    ALT (SGPT) 7 (L) 10 - 55 U/L    AST (SGOT)  10 8 - 45 U/L    BILIRUBIN TOTAL 0.6 0.3 - 1.3 mg/dL    PROTEIN TOTAL 8.2 6.4 - 8.3 g/dL   LDL CHOLESTEROL, DIRECT   Result Value Ref Range    LDL DIRECT 29 <100 mg/dL   PT/INR   Result Value Ref Range    PROTHROMBIN TIME 19.5 (H) 9.7 - 13.6 seconds    INR 1.63 <=5.00   PTT (PARTIAL THROMBOPLASTIN  TIME)   Result Value Ref Range    APTT 52.6 (H) 26.0 - 39.0 seconds   CBC WITH DIFF   Result Value Ref Range    WBC 13.8 (H) 3.7 - 11.0 x10^3/uL    RBC 2.88 (L) 4.50 - 6.10 x10^6/uL    HGB 7.6 (L) 13.4 - 17.5 g/dL    HCT 25.6 (L) 38.9 - 52.0 %    MCV 88.9 78.0 - 100.0 fL    MCH 26.4 26.0 - 32.0 pg    MCHC 29.7 (L) 31.0 - 35.5 g/dL    RDW-CV 14.9 11.5 - 15.5 %    PLATELETS 496 (H) 150 - 400 x10^3/uL    MPV 9.6 8.7 - 12.5 fL   MANUAL DIFF AND MORPHOLOGY-SYSMEX   Result Value Ref Range    NEUTROPHIL % 71 %    LYMPHOCYTE %  15 %    MONOCYTE % 12 %    EOSINOPHIL % 1 %    BASOPHIL % 1 %    NEUTROPHIL # 9.80 (H) 1.50 - 7.70 x10^3/uL    LYMPHOCYTE # 2.07 1.00 - 4.80 x10^3/uL    MONOCYTE # 1.66 (H) 0.20 - 1.10  x10^3/uL    EOSINOPHIL # 0.14 <=0.50 x10^3/uL    BASOPHIL # 0.14 <=0.20 x10^3/uL    RBC MORPHOLOGY Normal RBC and PLT Morphology    HEPATITIS B SURFACE ANTIGEN   Result Value Ref Range    HBV SURFACE ANTIGEN QUALITATIVE Negative Negative   CBC - BASELINE   Result Value Ref Range    WBC 10.8 3.7 - 11.0 x10^3/uL    RBC 2.68 (L) 4.50 - 6.10 x10^6/uL    HGB 7.2 (L) 13.4 - 17.5 g/dL    HCT 23.3 (L) 38.9 - 52.0 %    MCV 86.9 78.0 - 100.0 fL    MCH 26.9 26.0 - 32.0 pg    MCHC 30.9 (L) 31.0 - 35.5 g/dL    RDW-CV 14.9 11.5 - 15.5 %    PLATELETS 467 (H) 150 - 400 x10^3/uL    MPV 9.4 8.7 - 12.5 fL   CBC/DIFF    Narrative    The following orders were created for panel order CBC/DIFF.  Procedure                               Abnormality         Status                     ---------                               -----------         ------                     CBC WITH HCWC[[id number]]                Abnormal            Final result                 Please view results for these tests on the individual orders.   COMPREHENSIVE METABOLIC PANEL, NON-FASTING   Result Value Ref Range    SODIUM 137 136 - 145 mmol/L    POTASSIUM 5.0 3.5 - 5.1 mmol/L    CHLORIDE 98 96 - 111 mmol/L  CO2 TOTAL 27 22 - 30 mmol/L    ANION GAP 12 4 - 13 mmol/L    BUN 33 (H) 8 - 25 mg/dL    CREATININE 4.94 (H) 0.75 - 1.35 mg/dL    BUN/CREA RATIO 7 6 - 22    ESTIMATED GFR 14 (L) >=60 mL/min/BSA    ALBUMIN 2.6 (L) 3.5 - 5.0 g/dL     CALCIUM 8.6 8.5 - 10.0 mg/dL    GLUCOSE 141 (H) 65 - 125 mg/dL    ALKALINE PHOSPHATASE 336 (H) 45 - 115 U/L    ALT (SGPT) 8 (L) 10 - 55 U/L    AST (SGOT)  10 8 - 45 U/L    BILIRUBIN TOTAL 0.6 0.3 - 1.3 mg/dL    PROTEIN TOTAL 7.9 6.4 - 8.3 g/dL   MAGNESIUM   Result Value Ref Range    MAGNESIUM 1.8 1.8 - 2.6 mg/dL   PHOSPHORUS   Result Value Ref Range    PHOSPHORUS 5.1 (H) 2.4 - 4.7 mg/dL   TROPONIN-I   Result Value Ref Range    TROPONIN I 32 (H) <=30 ng/L   PT/INR daily x 7   Result Value Ref Range    PROTHROMBIN TIME 18.1 (H) 9.7 - 13.6  seconds    INR 1.51 <=5.00   CBC WITH DIFF   Result Value Ref Range    WBC 10.8 3.7 - 11.0 x10^3/uL    RBC 2.97 (L) 4.50 - 6.10 x10^6/uL    HGB 7.8 (L) 13.4 - 17.5 g/dL    HCT 26.1 (L) 38.9 - 52.0 %    MCV 87.9 78.0 - 100.0 fL    MCH 26.3 26.0 - 32.0 pg    MCHC 29.9 (L) 31.0 - 35.5 g/dL    RDW-CV 14.9 11.5 - 15.5 %    PLATELETS 474 (H) 150 - 400 x10^3/uL    MPV 9.4 8.7 - 12.5 fL    NEUTROPHIL % 61 %    LYMPHOCYTE % 15 %    MONOCYTE % 20 %    EOSINOPHIL % 2 %    BASOPHIL % 1 %    NEUTROPHIL # 6.66 1.50 - 7.70 x10^3/uL    LYMPHOCYTE # 1.58 1.00 - 4.80 x10^3/uL    MONOCYTE # 2.21 (H) 0.20 - 1.10 x10^3/uL    EOSINOPHIL # 0.25 <=0.50 x10^3/uL    BASOPHIL # <0.10 <=0.20 x10^3/uL    IMMATURE GRANULOCYTE % 1 0 - 1 %    IMMATURE GRANULOCYTE # <0.10 <0.10 x10^3/uL   POC BLOOD GLUCOSE (RESULTS)   Result Value Ref Range    GLUCOSE, POC 264 Fasting: 80-130 mg/dL; 2 HR PC: <180 mg/dL mg/dl   Results for orders placed or performed in visit on 03/30/21 (from the past 24 hour(s))   BASIC METABOLIC PANEL   Result Value Ref Range    SODIUM 135 (L) 136 - 145 mmol/L    POTASSIUM 5.7 (H) 3.5 - 5.1 mmol/L    CHLORIDE 99 96 - 111 mmol/L    CO2 TOTAL 24 22 - 30 mmol/L    ANION GAP 12 4 - 13 mmol/L    CALCIUM 9.3 8.5 - 10.0 mg/dL    GLUCOSE 198 (H) 65 - 125 mg/dL    BUN 54 (H) 8 - 25 mg/dL    CREATININE 7.36 (H) 0.75 - 1.35 mg/dL    BUN/CREA RATIO 7 6 - 22    ESTIMATED GFR 8 (L) >=60 mL/min/BSA   Results for orders placed or performed  during the hospital encounter of 03/28/21 (from the past 24 hour(s))   PT/INR   Result Value Ref Range    PROTHROMBIN TIME 20.2 (H) 9.5 - 13.4 seconds    INR 1.74 (L) 2.00 - 3.00            Imaging:    Results for orders placed or performed during the hospital encounter of 03/30/21 (from the past 24 hour(s))   XR AP MOBILE CHEST     Status: None    Narrative    Chest AP portable upright 2:18 PM. Comparison 03/02/2021.  HISTORY: Chest pain, since yesterday      Impression    1. Tips of right jugular dual-lumen  catheter are at the cavoatrial junction, unchanged.  2. Heart is enlarged, with pulmonary vascular congestion (some improvement).  3. There are persistent bibasal opacities, consolidative on the left side, evident previously. There has been partial clearing at the left lateral lung base. No new abnormality is seen.      Radiologist location ID: [facility]           Microbiology:  No results found for any visits on 03/30/21 (from the past 96 hour(s)).        Assessment/ Plan:   Active Hospital Problems    Diagnosis   . Primary Problem: Chest pain at rest   . End stage renal disease on dialysis (CMS HCC)   . Cardiovascular disease   . History of right below knee amputation (CMS HCC)   . Hyperkalemia   . Anemia      Chest pain.  Patient has a history of CAD status post multiple PCI.  EKG showed some lateral T-wave changes and troponins were flat trend.  Await for Cardiology evaluation and further recommendations.  Continue Coreg Imdur.  Continue aspirin.    End-stage renal disease on hemodialysis.  Noncompliant with dialysis.  Nephrology consulted.      History of DVT.  I last saw the patient 2021 he had a history of DVT not on anticoagulation.  Now he is on anticoagulation.  I do not see any vascular ultrasounds that were done within the system during that time.  He is subtherapeutic so his warfarin is being managed by Pharmacy.    Type 2 diabetes.  Continue basal bolus insulin.    Hyperlipidemia/peripheral vascular disease.  Continue aspirin statin and Zetia.    Full code   DVT prophylaxis heparin   Dispo pending cardiology evaluation.    Rockne Coons, DO    This note was partially generated using MModal Fluency Direct system, and there may be some incorrect words, spellings, and punctuation that were not noted in checking the note before saving

## 2021-03-31 NOTE — Consults (Signed)
Interventional Cardiology  Note  [facility]  [address]  [place] [address]  [phone]      Name: Max [name].                       Date of Birth: 29-Aug-1970   MRN:  [id number]                         Date of visit: 03/30/2021     PCP: Azzie Roup, MD     Reason for Consult:     HOPI:        Max [name]. is a  51 y.o. year old male who presents with complaints of    intermittent chest pain.    His medical history significant for chronic kidney disease on dialysis 3 times a week, hypertension, hyperlipidemia, diabetes, heart failure with preserved ejection fraction.  He states that his troubled by nonexertional substernal chest discomfort rate left arm.  He also reports mild exertional shortness of breath.  Troponins mildly elevated.  EKG showed sinus rhythm with right bundle-branch block with nonspecific ST-T changes.  No significant change from the baseline EKG.  H&H is measured 7.2/25.  Cardiology is consulted for further management.  Past Medical History:   Diagnosis Date   . Arthropathy    . CKD (chronic kidney disease), stage III (CMS HCC) 08/25/2019   . Congestive heart failure (CMS HCC)    . Coronary artery disease    . Diabetes mellitus, type 2 (CMS HCC)    . Headache    . HTN (hypertension)    . Hx MRSA infection            Past Surgical History:   Procedure Laterality Date   . AMPUTATION FOOT / TOE Left    . CORONARY ARTERY ANGIOPLASTY     . HX CATARACT REMOVAL Bilateral            No current outpatient medications on file.       Allergies   Allergen Reactions   . Toradol [Ketorolac] Rash   . Ultram [Tramadol] Rash   . Benadryl [Diphenhydramine]  Other Adverse Reaction (Add comment)     irritable   . Penicillin G Benethamine        Family Medical History:     Problem Relation (Age of Onset)    Cancer Father    Coronary Artery Disease Mother, Father            Social History     Tobacco Use   . Smoking status: Never   . Smokeless tobacco: Never   Substance  Use Topics   . Alcohol use: Never        REVIEW OF SYSTEMS:   Eyes:No loss of vision.  ENT:  No abnormality in hearing.  Cardiovascularno palpitations.  Respiratory:  No cough, no hemoptysis.  Gastrointestinal:  no diarrhea.  Central nervous system:  no Headaches, weakness.  Musculoskeletal:  No leg pain, no arm pain  Urinary system:  No hematuria.  Constitutional:weakness.  Psychiatry:  Normal mood.    All other systems reviewed and are negative except as noted in history of presenting illness..    GENERAL EXAMINATION:    Vitals reviewed. BP 101/66   Pulse 75   Temp 36.7 C (98.1 F)   Resp 12   Ht 1.727 m (5\' 8" )   Wt 86.2 kg (190 lb)   SpO2  94%   BMI 28.89 kg/m         General: Alert ,oriented x3.  Neck : supple, JVD enormal.  Heart:  S1 + S2+, murmurs.  Lungs:  No wheeze no rhonchi ,normal breath sounds.  Abdomen:  Soft ,non-tender, no organomegaly.  Neuro :  Cranial nerves intact, no sensory motor deficits.  Musculoskeletal system:  No tenderness.  Skin:  No rash  Right leg above -knee amputation.  Labs:     Results for orders placed or performed during the hospital encounter of 03/30/21 (from the past 24 hour(s))   TROPONIN-I (SERIAL TROPONINS)   Result Value Ref Range    TROPONIN I 31 (HH) <=30 ng/L   TROPONIN-I (SERIAL TROPONINS)   Result Value Ref Range    TROPONIN I 35 (H) <=30 ng/L   TROPONIN-I (SERIAL TROPONINS)   Result Value Ref Range    TROPONIN I 33 (H) <=30 ng/L   CBC/DIFF    Narrative    The following orders were created for panel order CBC/DIFF.  Procedure                               Abnormality         Status                     ---------                               -----------         ------                     CBC WITH DIFF[[id number]]                Abnormal            Final result               MANUAL DIFF AND MORPHOLO.Marland KitchenMarland Kitchen[[id number]]  Abnormal            Final result                 Please view results for these tests on the individual orders.   COMPREHENSIVE METABOLIC PANEL,  NON-FASTING   Result Value Ref Range    SODIUM 133 (L) 136 - 145 mmol/L    POTASSIUM 5.3 (H) 3.5 - 5.1 mmol/L    CHLORIDE 96 96 - 111 mmol/L    CO2 TOTAL 22 22 - 30 mmol/L    ANION GAP 15 (H) 4 - 13 mmol/L    BUN 53 (H) 8 - 25 mg/dL    CREATININE 7.26 (H) 0.75 - 1.35 mg/dL    BUN/CREA RATIO 7 6 - 22    ESTIMATED GFR 9 (L) >=60 mL/min/BSA    ALBUMIN 2.8 (L) 3.5 - 5.0 g/dL     CALCIUM 8.7 8.5 - 10.0 mg/dL    GLUCOSE 93 65 - 125 mg/dL    ALKALINE PHOSPHATASE 322 (H) 45 - 115 U/L    ALT (SGPT) 7 (L) 10 - 55 U/L    AST (SGOT)  10 8 - 45 U/L    BILIRUBIN TOTAL 0.6 0.3 - 1.3 mg/dL    PROTEIN TOTAL 8.2 6.4 - 8.3 g/dL   LDL CHOLESTEROL, DIRECT   Result Value Ref Range    LDL DIRECT 29 <100 mg/dL   PT/INR   Result  Value Ref Range    PROTHROMBIN TIME 19.5 (H) 9.7 - 13.6 seconds    INR 1.63 <=5.00   PTT (PARTIAL THROMBOPLASTIN TIME)   Result Value Ref Range    APTT 52.6 (H) 26.0 - 39.0 seconds   CBC WITH DIFF   Result Value Ref Range    WBC 13.8 (H) 3.7 - 11.0 x10^3/uL    RBC 2.88 (L) 4.50 - 6.10 x10^6/uL    HGB 7.6 (L) 13.4 - 17.5 g/dL    HCT 25.6 (L) 38.9 - 52.0 %    MCV 88.9 78.0 - 100.0 fL    MCH 26.4 26.0 - 32.0 pg    MCHC 29.7 (L) 31.0 - 35.5 g/dL    RDW-CV 14.9 11.5 - 15.5 %    PLATELETS 496 (H) 150 - 400 x10^3/uL    MPV 9.6 8.7 - 12.5 fL   MANUAL DIFF AND MORPHOLOGY-SYSMEX   Result Value Ref Range    NEUTROPHIL % 71 %    LYMPHOCYTE %  15 %    MONOCYTE % 12 %    EOSINOPHIL % 1 %    BASOPHIL % 1 %    NEUTROPHIL # 9.80 (H) 1.50 - 7.70 x10^3/uL    LYMPHOCYTE # 2.07 1.00 - 4.80 x10^3/uL    MONOCYTE # 1.66 (H) 0.20 - 1.10 x10^3/uL    EOSINOPHIL # 0.14 <=0.50 x10^3/uL    BASOPHIL # 0.14 <=0.20 x10^3/uL    RBC MORPHOLOGY Normal RBC and PLT Morphology    HEPATITIS B SURFACE ANTIGEN   Result Value Ref Range    HBV SURFACE ANTIGEN QUALITATIVE Negative Negative   CBC - BASELINE   Result Value Ref Range    WBC 10.8 3.7 - 11.0 x10^3/uL    RBC 2.68 (L) 4.50 - 6.10 x10^6/uL    HGB 7.2 (L) 13.4 - 17.5 g/dL    HCT 23.3 (L) 38.9 - 52.0 %     MCV 86.9 78.0 - 100.0 fL    MCH 26.9 26.0 - 32.0 pg    MCHC 30.9 (L) 31.0 - 35.5 g/dL    RDW-CV 14.9 11.5 - 15.5 %    PLATELETS 467 (H) 150 - 400 x10^3/uL    MPV 9.4 8.7 - 12.5 fL   CBC/DIFF    Narrative    The following orders were created for panel order CBC/DIFF.  Procedure                               Abnormality         Status                     ---------                               -----------         ------                     CBC WITH RUEA[[id number]]                Abnormal            Final result                 Please view results for these tests on the individual orders.   COMPREHENSIVE METABOLIC PANEL, NON-FASTING   Result Value Ref Range    SODIUM  137 136 - 145 mmol/L    POTASSIUM 5.0 3.5 - 5.1 mmol/L    CHLORIDE 98 96 - 111 mmol/L    CO2 TOTAL 27 22 - 30 mmol/L    ANION GAP 12 4 - 13 mmol/L    BUN 33 (H) 8 - 25 mg/dL    CREATININE 4.94 (H) 0.75 - 1.35 mg/dL    BUN/CREA RATIO 7 6 - 22    ESTIMATED GFR 14 (L) >=60 mL/min/BSA    ALBUMIN 2.6 (L) 3.5 - 5.0 g/dL     CALCIUM 8.6 8.5 - 10.0 mg/dL    GLUCOSE 141 (H) 65 - 125 mg/dL    ALKALINE PHOSPHATASE 336 (H) 45 - 115 U/L    ALT (SGPT) 8 (L) 10 - 55 U/L    AST (SGOT)  10 8 - 45 U/L    BILIRUBIN TOTAL 0.6 0.3 - 1.3 mg/dL    PROTEIN TOTAL 7.9 6.4 - 8.3 g/dL   MAGNESIUM   Result Value Ref Range    MAGNESIUM 1.8 1.8 - 2.6 mg/dL   PHOSPHORUS   Result Value Ref Range    PHOSPHORUS 5.1 (H) 2.4 - 4.7 mg/dL   TROPONIN-I   Result Value Ref Range    TROPONIN I 32 (H) <=30 ng/L   PT/INR daily x 7   Result Value Ref Range    PROTHROMBIN TIME 18.1 (H) 9.7 - 13.6 seconds    INR 1.51 <=5.00   CBC WITH DIFF   Result Value Ref Range    WBC 10.8 3.7 - 11.0 x10^3/uL    RBC 2.97 (L) 4.50 - 6.10 x10^6/uL    HGB 7.8 (L) 13.4 - 17.5 g/dL    HCT 26.1 (L) 38.9 - 52.0 %    MCV 87.9 78.0 - 100.0 fL    MCH 26.3 26.0 - 32.0 pg    MCHC 29.9 (L) 31.0 - 35.5 g/dL    RDW-CV 14.9 11.5 - 15.5 %    PLATELETS 474 (H) 150 - 400 x10^3/uL    MPV 9.4 8.7 - 12.5 fL    NEUTROPHIL % 61 %     LYMPHOCYTE % 15 %    MONOCYTE % 20 %    EOSINOPHIL % 2 %    BASOPHIL % 1 %    NEUTROPHIL # 6.66 1.50 - 7.70 x10^3/uL    LYMPHOCYTE # 1.58 1.00 - 4.80 x10^3/uL    MONOCYTE # 2.21 (H) 0.20 - 1.10 x10^3/uL    EOSINOPHIL # 0.25 <=0.50 x10^3/uL    BASOPHIL # <0.10 <=0.20 x10^3/uL    IMMATURE GRANULOCYTE % 1 0 - 1 %    IMMATURE GRANULOCYTE # <0.10 <0.10 x10^3/uL   POC BLOOD GLUCOSE (RESULTS)   Result Value Ref Range    GLUCOSE, POC 264 Fasting: 80-130 mg/dL; 2 HR PC: <180 mg/dL mg/dl   POC BLOOD GLUCOSE (RESULTS)   Result Value Ref Range    GLUCOSE, POC 204 Fasting: 80-130 mg/dL; 2 HR PC: <180 mg/dL mg/dl       Labs:     Lab Results   Component Value Date    HA1C 5.6 02/28/2021    LDLCHOL  08/26/2019      Comment:      CALCULATION FOR LDL/VLDL-CHOLESTEROL IS INACCURATE WHEN  TRIGLYCERIDE RESULT IS GREATER THAN 400 mg/dL  <100 mg/dL, Optimal  100-129 mg/dL, Near/Above Optimal  130-159 mg/dL, Borderline High  160-189 mg/dL, High  >=190 mg/dL, Very high    TRIG 424 (H) 08/26/2019    HDLCHOL  24 (L) 08/26/2019    CHOLESTEROL 185 08/26/2019      Lab Results   Component Value Date    SODIUM 137 03/31/2021    POTASSIUM 5.0 03/31/2021    CHLORIDE 98 03/31/2021    CO2 27 03/31/2021    BUN 33 (H) 03/31/2021    CREATININE 4.94 (H) 03/31/2021    GFR 14 (L) 03/31/2021    CALCIUM 8.6 03/31/2021       I REVIEWED THE PERTINENT LABS.  Imaging:    Assessment:    Assessment/Plan   1. Chest pain at rest    2. ESRD on dialysis (CMS HCC)    3. Hyperkalemia             Plan:    Orders Placed This Encounter   . XR AP MOBILE CHEST   . CANCELED: XR AP MOBILE CHEST   . TROPONIN-I (SERIAL TROPONINS)   . CBC/DIFF   . COMPREHENSIVE METABOLIC PANEL, NON-FASTING   . LDL CHOLESTEROL, DIRECT   . PT/INR   . PTT (PARTIAL THROMBOPLASTIN TIME)   . CBC WITH DIFF   . MANUAL DIFF AND MORPHOLOGY-SYSMEX   . HEPATITIS B SURFACE ANTIGEN   . TROPONIN-I every 3 hours with EKG   . CBC/DIFF   . COMPREHENSIVE METABOLIC PANEL, NON-FASTING   . MAGNESIUM   . PHOSPHORUS   .  TROPONIN-I   . PT/INR daily x 7   . CBC - BASELINE   . CANCELED: CBC - DAY 1   . CBC - DAY 2   . CBC - DAY 3   . OCCULT BLOOD, STOOL   . CBC WITH DIFF   . OXYGEN PROTOCOL   . OXYGEN PROTOCOL   . ECG 12 LEAD- TIME WITH TROPONINS   . ECG 12 LEAD   . ECG 12 LEAD   . INSERT & MAINTAIN PERIPHERAL IV ACCESS   . PERIPHERAL IV DRESSING CHANGE   . INSERT & MAINTAIN PERIPHERAL IV ACCESS   . PERIPHERAL IV DRESSING CHANGE   . HEMODIALYSIS   . DIALYSIS NURSE NOTIFICATION   . CANCELED: HEMODIALYSIS   . DIALYSIS NURSE NOTIFICATION   . HEMODIALYSIS   . CANCELED: PATIENT CLASS/LEVEL OF CARE DESIGNATION - [facility]   . PT CLASS/LEVEL OF CARE CLARIFICATION   . NS flush syringe   . NS flush syringe   . fentaNYL (SUBLIMAZE) 50 mcg/mL injection   . ondansetron (ZOFRAN) 2 mg/mL injection   . heparin 1,000 units/mL injection IV bolus   . heparin 1,000 units/mL injection IV bolus   . epoetin alfa (PROCRIT) 20,000 units/mL injection   . paricalcitol (ZEMPLAR) 5 mcg/mL injection   . carvedilol (COREG) tablet   . ranolazine (RANEXA) extended release tablet   . gabapentin (NEURONTIN) capsule   . atorvastatin (LIPITOR) tablet   . amLODIPine (NORVASC) tablet   . sevelamer carbonate (RENVELA) tablet   . sodium zirconium cyclosilicate (LOKELMA) powder   . ferrous sulfate 325 mg (elemental IRON 65 mg) tablet   . docusate sodium (COLACE) capsule   . polyethylene glycol (MIRALAX) oral packet   . ezetimibe (ZETIA) tablet   . torsemide (DEMADEX) tablet   . oxyCODONE (ROXICODONE) immediate release tablet   . pantoprazole (PROTONIX) delayed release tablet   . DULoxetine (CYMBALTA) delayed release capsule   . cyclobenzaprine (FLEXERIL) tablet   . isosorbide mononitrate (IMDUR) 24 hr extended release tablet   . magnesium hydroxide (MILK OF MAGNESIA) 400mg  per 16mL oral liquid   . ondansetron (ZOFRAN) 2 mg/mL  injection   . NS flush syringe   . NS flush syringe   . aspirin chewable tablet 81 mg   . Warfarin - Pharmacist to Dose per Protocol   . correctional insulin  lispro (HUMALOG) 100 units/mL injection   . insulin glargine-yfgn 100 Units/mL SubQ pen   . warfarin (COUMADIN) tablet       Coronary disease status post PCI  Hypertension   hyperlipidemia  diabetes,  End-stage renal disease on dialysis X 3 days/ week for last 3 months.    Troponins mildly elevated.  EKG showed no significant change.    Plan:  -pharmacological nuclear cardiac stress test tomorrow  -continue aspirin, statin beta-blockers.  -correct anemia.  -outpatient follow-up.  -continue follow-up tomorrow.       The patient was given chance to ask questions regarding medical care.  I have addressed all the questions appropriately.  Patient  understood the discussion and  agrees with the plan.    Thank you very much for involving me in the care of this patient.  Please feel free to call me if I can be of any further assistance.    Electronically signed by     Inocencio Homes) Lissa Hoard, MD, [facility].  Interventional Cardiology ([facility])  CBCCT, CBNC, RPVI, ABVM, NBE.   03/31/2021, 13:47    This note may have been partially generated using MModal Fluency Direct system, and there may be some incorrect words, spellings, and punctuation that were not noted in checking the note before saving, though effort was made to avoid such errors.

## 2021-03-31 NOTE — Nurses Notes (Signed)
pt c/o of chest pain at 0840    EKG and vitals obtained.    0847   chest pain alert sent in epic to Dr Laurena Bering    [phone]   messages back non-stemi

## 2021-03-31 NOTE — Care Management Notes (Signed)
Patient is a 30-day readmit.  Patient is from SNF. Patient is excluded unless otherwise needed.    Debroah Baller  03/31/2021, 09:30

## 2021-04-01 ENCOUNTER — Inpatient Hospital Stay (HOSPITAL_COMMUNITY): Payer: Medicare (Managed Care)

## 2021-04-01 ENCOUNTER — Inpatient Hospital Stay (HOSPITAL_COMMUNITY): Payer: Medicare (Managed Care) | Admitting: Radiology

## 2021-04-01 ENCOUNTER — Other Ambulatory Visit: Payer: Self-pay

## 2021-04-01 DIAGNOSIS — R079 Chest pain, unspecified: Secondary | ICD-10-CM

## 2021-04-01 LAB — CBC WITH DIFF
BASOPHIL #: <0.1 10*3/uL (ref <=0.20)
BASOPHIL %: 0 %
EOSINOPHIL #: 0.16 10*3/uL (ref <=0.50)
EOSINOPHIL %: 1 %
HCT: 26.3 % — ABNORMAL LOW (ref 38.9–52.0)
HGB: 8 g/dL — ABNORMAL LOW (ref 13.4–17.5)
IMMATURE GRANULOCYTE #: <0.1 10*3/uL (ref <0.10)
IMMATURE GRANULOCYTE %: 1 % (ref 0–1)
LYMPHOCYTE #: 1.88 10*3/uL (ref 1.00–4.80)
LYMPHOCYTE %: 16 %
MCH: 26.9 pg (ref 26.0–32.0)
MCHC: 30.4 g/dL — ABNORMAL LOW (ref 31.0–35.5)
MCV: 88.6 fL (ref 78.0–100.0)
MONOCYTE #: 2.32 10*3/uL — ABNORMAL HIGH (ref 0.20–1.10)
MONOCYTE %: 20 %
MPV: 9.4 fL (ref 8.7–12.5)
NEUTROPHIL #: 7.28 10*3/uL (ref 1.50–7.70)
NEUTROPHIL %: 62 %
PLATELETS: 451 10*3/uL — ABNORMAL HIGH (ref 150–400)
RBC: 2.97 10*6/uL — ABNORMAL LOW (ref 4.50–6.10)
RDW-CV: 14.9 % (ref 11.5–15.5)
WBC: 11.8 10*3/uL — ABNORMAL HIGH (ref 3.7–11.0)

## 2021-04-01 LAB — MYOCARDIAL PERFUSION COMPLETE
Angina Index: 0
Baseline HR: 73 {beats}/min
Nuc Stress EF: 46 %
Peak Diastolic BP for Stress Tests: 74 mm/Hg
Peak Systolic BP Stress Test: 106 mm/Hg
Post peak HR: 86 {beats}/min
RPP: 9116
ST DEPRESSION - STRESS: 0 mm
TID: 1.1
Target HR: 146 {beats}/min

## 2021-04-01 LAB — BPAM PACKED CELL ORDER
UNIT DIVISION: 0
UNIT DIVISION: 0

## 2021-04-01 LAB — PT/INR
INR: 1.87 (ref <=5.00)
PROTHROMBIN TIME: 22.5 seconds — ABNORMAL HIGH (ref 9.7–13.6)

## 2021-04-01 LAB — BASIC METABOLIC PANEL
ANION GAP: 12 mmol/L (ref 4–13)
BUN/CREA RATIO: 6 (ref 6–22)
BUN: 21 mg/dL (ref 8–25)
CALCIUM: 8.6 mg/dL (ref 8.5–10.0)
CHLORIDE: 96 mmol/L (ref 96–111)
CO2 TOTAL: 28 mmol/L (ref 22–30)
CREATININE: 3.72 mg/dL — ABNORMAL HIGH (ref 0.75–1.35)
ESTIMATED GFR: 19 mL/min/BSA — ABNORMAL LOW (ref >=60)
GLUCOSE: 159 mg/dL — ABNORMAL HIGH (ref 65–125)
POTASSIUM: 4.4 mmol/L (ref 3.5–5.1)
SODIUM: 136 mmol/L (ref 136–145)

## 2021-04-01 LAB — CBC
HCT: 25.8 % — ABNORMAL LOW (ref 38.9–52.0)
HGB: 8 g/dL — ABNORMAL LOW (ref 13.4–17.5)
MCH: 27 pg (ref 26.0–32.0)
MCHC: 31 g/dL (ref 31.0–35.5)
MCV: 87.2 fL (ref 78.0–100.0)
MPV: 9.4 fL (ref 8.7–12.5)
PLATELETS: 440 10*3/uL — ABNORMAL HIGH (ref 150–400)
RBC: 2.96 10*6/uL — ABNORMAL LOW (ref 4.50–6.10)
RDW-CV: 15 % (ref 11.5–15.5)
WBC: 11.8 10*3/uL — ABNORMAL HIGH (ref 3.7–11.0)

## 2021-04-01 LAB — TYPE AND CROSS RED CELLS - UNITS
ANTIBODY SCREEN: NEGATIVE
UNITS ORDERED: 2

## 2021-04-01 LAB — POC BLOOD GLUCOSE (RESULTS)
GLUCOSE, POC: 267 mg/dl (ref 80–130)
GLUCOSE, POC: 87 mg/dl (ref 80–130)

## 2021-04-01 LAB — PTT (PARTIAL THROMBOPLASTIN TIME)
APTT: 50.5 seconds — ABNORMAL HIGH (ref 26.0–39.0)
APTT: 53.5 seconds — ABNORMAL HIGH (ref 26.0–39.0)

## 2021-04-01 MED ORDER — REGADENOSON 0.4 MG/5 ML INTRAVENOUS SYRINGE
0.4000 mg | INJECTION | INTRAVENOUS | Status: AC
Start: 2021-04-01 — End: 2021-04-01
  Administered 2021-04-01: 0.4 mg via INTRAVENOUS

## 2021-04-01 MED ORDER — REGADENOSON 0.4 MG/5 ML INTRAVENOUS SYRINGE
INJECTION | INTRAVENOUS | Status: AC
Start: 2021-04-01 — End: 2021-04-01
  Filled 2021-04-01: qty 5

## 2021-04-01 MED ORDER — SODIUM CHLORIDE 0.9 % IV BOLUS
40.0000 mL | INJECTION | Freq: Once | Status: AC | PRN
Start: 2021-04-01 — End: 2021-04-01

## 2021-04-01 MED ORDER — WARFARIN 3 MG TABLET
3.0000 mg | ORAL_TABLET | Freq: Once | ORAL | Status: DC
Start: 2021-04-01 — End: 2021-04-01

## 2021-04-01 MED ORDER — HEPARIN 1,000 UNITS/ML BOLUS FOR DOSE ADJUSTMENT
1.0000 mL | Freq: Once | INTRAMUSCULAR | Status: AC | PRN
Start: 2021-04-01 — End: 2021-04-02
  Administered 2021-04-02: 3200 [IU]

## 2021-04-01 MED ORDER — HEPARIN (PORCINE) 25,000 UNIT/250 ML IN 0.45 % SODIUM CHLORIDE IV SOLN
12.0000 [IU]/kg/h | INTRAVENOUS | Status: DC
Start: 2021-04-01 — End: 2021-04-06
  Administered 2021-04-01 – 2021-04-02 (×4): 12 [IU]/kg/h via INTRAVENOUS
  Administered 2021-04-02: 14 [IU]/kg/h via INTRAVENOUS
  Administered 2021-04-02: 0 [IU]/kg/h via INTRAVENOUS
  Administered 2021-04-02 – 2021-04-03 (×3): 14 [IU]/kg/h via INTRAVENOUS
  Administered 2021-04-03 – 2021-04-04 (×5): 18 [IU]/kg/h via INTRAVENOUS
  Administered 2021-04-05: 0 [IU]/kg/h via INTRAVENOUS
  Administered 2021-04-05: 18 [IU]/kg/h via INTRAVENOUS
  Filled 2021-04-01 (×8): qty 250

## 2021-04-01 MED ORDER — EPOETIN ALFA 20,000 UNIT/ML INJECTION
150.0000 [IU]/kg | INTRAMUSCULAR | Status: AC
Start: 2021-04-01 — End: 2021-04-02
  Administered 2021-04-02: 11000 [IU] via INTRAVENOUS
  Filled 2021-04-01: qty 1

## 2021-04-01 MED ORDER — PARICALCITOL 5 MCG/ML INTRAVENOUS SOLUTION
2.0000 ug | INTRAVENOUS | Status: AC
Start: 2021-04-01 — End: 2021-04-02
  Administered 2021-04-02: 2 ug via INTRAVENOUS

## 2021-04-01 MED ORDER — HEPARIN (PORCINE) 5,000 UNITS/ML BOLUS
60.0000 [IU]/kg | Freq: Once | INTRAMUSCULAR | Status: AC
Start: 2021-04-01 — End: 2021-04-01
  Administered 2021-04-01: 4500 [IU] via INTRAVENOUS
  Filled 2021-04-01: qty 1

## 2021-04-01 MED ORDER — SODIUM CHLORIDE 0.9 % IV BOLUS
40.0000 mL | INJECTION | Freq: Once | Status: DC | PRN
Start: 2021-04-01 — End: 2021-04-01

## 2021-04-01 NOTE — Care Plan (Signed)
Pt came back from HD, pulled out 2 L as documented and well tolerated. Kept NPO after mn, plan for stress test in am.   Lina Sar, RN  04/01/2021, 04:14     Problem: Adult Inpatient Plan of Care  Goal: Plan of Care Review  Outcome: Ongoing (see interventions/notes)  Goal: Patient-Specific Goal (Individualized)  Outcome: Ongoing (see interventions/notes)  Goal: Absence of Hospital-Acquired Illness or Injury  Outcome: Ongoing (see interventions/notes)  Intervention: Identify and Manage Fall Risk  Recent Flowsheet Documentation  Taken 03/31/2021 2136 by Lina Sar, RN  Safety Promotion/Fall Prevention:   activity supervised   fall prevention program maintained   muscle strengthening facilitated   motion sensor pad activated   nonskid shoes/slippers when out of bed   safety round/check completed  Intervention: Prevent Skin Injury  Recent Flowsheet Documentation  Taken 04/01/2021 0200 by Lina Sar, RN  Body Position:   side lying, right   positioned/repositioned independently  Taken 04/01/2021 0000 by Lina Sar, RN  Body Position:   positioned/repositioned independently   side lying, left  Taken 03/31/2021 2200 by Lina Sar, RN  Body Position: positioned/repositioned independently  Taken 03/31/2021 2136 by Lina Sar, RN  Body Position: positioned/repositioned independently  Skin Protection: adhesive use limited  Intervention: Prevent and Manage VTE (Venous Thromboembolism) Risk  Recent Flowsheet Documentation  Taken 03/31/2021 2136 by Lina Sar, RN  VTE Prevention/Management: anticoagulant therapy maintained  Intervention: Prevent Infection  Recent Flowsheet Documentation  Taken 03/31/2021 2136 by Lina Sar, RN  Infection Prevention: barrier precautions utilized  Goal: Optimal Comfort and Wellbeing  Outcome: Ongoing (see interventions/notes)  Intervention: Provide Person-Centered Care  Recent Flowsheet Documentation  Taken 03/31/2021 2136 by Lina Sar, [place] Relationship/Rapport: care  explained  Goal: Rounds/Family Conference  Outcome: Ongoing (see interventions/notes)     Problem: Fall Injury Risk  Goal: Absence of Fall and Fall-Related Injury  Outcome: Ongoing (see interventions/notes)  Intervention: Promote Injury-Free Environment  Recent Flowsheet Documentation  Taken 03/31/2021 2136 by Lina Sar, RN  Safety Promotion/Fall Prevention:   activity supervised   fall prevention program maintained   muscle strengthening facilitated   motion sensor pad activated   nonskid shoes/slippers when out of bed   safety round/check completed     Problem: Skin Injury Risk Increased  Goal: Skin Health and Integrity  Outcome: Ongoing (see interventions/notes)  Intervention: Optimize Skin Protection  Recent Flowsheet Documentation  Taken 03/31/2021 2136 by Lina Sar, RN  Pressure Reduction Devices: (M,H,VH) Use Repositioning Devices or Pillows  Skin Protection: adhesive use limited  Activity Management: activity adjusted per tolerance  Head of Bed (HOB) Positioning: HOB at 20-30 degrees     Problem: Chest Pain  Goal: Resolution of Chest Pain Symptoms  Outcome: Ongoing (see interventions/notes)     Problem: Gas Exchange Impaired  Goal: Optimal Gas Exchange  Outcome: Ongoing (see interventions/notes)  Intervention: Optimize Oxygenation and Ventilation  Recent Flowsheet Documentation  Taken 03/31/2021 2136 by Lina Sar, RN  Head of Bed [facility]) Positioning: HOB at 20-30 degrees

## 2021-04-01 NOTE — Nurses Notes (Signed)
APTT therapeutic range next draw 2100

## 2021-04-01 NOTE — Progress Notes (Addendum)
Interventional Cardiology  Note  [facility]  [address]  [place] [address]  [phone]      Name: Max [name].                       Date of Birth: [birth year]   MRN:  [id number]                         Date of visit: 03/30/2021     PCP: Azzie Roup, MD     Reason for Consult:     HOPI:      Max [name]. is a  51 y.o. year old male who presents with complaints of    intermittent chest pain.    His medical history significant for chronic kidney disease on dialysis 3 times a week, hypertension, hyperlipidemia, diabetes, heart failure with preserved ejection fraction.  He states that his troubled by nonexertional substernal chest discomfort rate left arm.  He also reports mild exertional shortness of breath.  Troponins mildly elevated.  EKG showed sinus rhythm with right bundle-branch block with nonspecific ST-T changes.  No significant change from the baseline EKG.  H&H is measured 7.2/25.  Cardiology is consulted for further management.    Remains troubled by continues chest pain.  Stress test showed small area of ischemia.    Past Medical History:   Diagnosis Date   . Arthropathy    . CKD (chronic kidney disease), stage III (CMS HCC) 08/25/2019   . Congestive heart failure (CMS HCC)    . Coronary artery disease    . Diabetes mellitus, type 2 (CMS HCC)    . Headache    . HTN (hypertension)    . Hx MRSA infection            Past Surgical History:   Procedure Laterality Date   . AMPUTATION FOOT / TOE Left    . CORONARY ARTERY ANGIOPLASTY     . HX CATARACT REMOVAL Bilateral            No current outpatient medications on file.       Allergies   Allergen Reactions   . Toradol [Ketorolac] Rash   . Ultram [Tramadol] Rash   . Benadryl [Diphenhydramine]  Other Adverse Reaction (Add comment)     irritable   . Penicillin G Benethamine        Family Medical History:     Problem Relation (Age of Onset)    Cancer Father    Coronary Artery Disease Mother, Father            Social  History     Tobacco Use   . Smoking status: Never   . Smokeless tobacco: Never   Substance Use Topics   . Alcohol use: Never        REVIEW OF SYSTEMS:   Eyes:No loss of vision.  ENT: No abnormality in hearing.  Cardiovascularno palpitations.  Respiratory: No cough, no hemoptysis.  Gastrointestinal: no diarrhea.  Central nervous system:noHeadaches, weakness.  Musculoskeletal: No leg pain, no arm pain  Urinary system:No hematuria.  Constitutional:weakness.  Psychiatry:Normal mood.    All other systems reviewed and are negative except as noted in history of presenting illness..    GENERAL EXAMINATION:    Vitals reviewed. BP 102/67   Pulse 71   Temp 36.7 C (98.1 F)   Resp 15   Ht 1.727 m (5\' 8" )   Wt 87.2  kg (192 lb 3.2 oz)   SpO2 93%   BMI 29.22 kg/m       General:Alert ,oriented x3.  Neck: supple, JVD enormal.  Heart:S1 + S2+, murmurs.  Lungs:No wheeze no rhonchi ,normal breath sounds.  Abdomen: Soft ,non-tender, no organomegaly.  Neuro : Cranial nerves intact, no sensory motor deficits.  Musculoskeletal system:No tenderness.  Skin: No rash  Right leg above -knee amputation  Labs:     Results for orders placed or performed during the hospital encounter of 03/30/21 (from the past 24 hour(s))   PT/INR daily x 7   Result Value Ref Range    PROTHROMBIN TIME 22.5 (H) 9.7 - 13.6 seconds    INR 1.87 <=5.00   CBC/DIFF - AM ONCE    Narrative    The following orders were created for panel order CBC/DIFF - AM ONCE.  Procedure                               Abnormality         Status                     ---------                               -----------         ------                     CBC WITH JYNW[[id number]]                Abnormal            Final result                 Please view results for these tests on the individual orders.   BASIC METABOLIC PANEL - AM ONCE   Result Value Ref Range    SODIUM 136 136 - 145 mmol/L    POTASSIUM 4.4 3.5 - 5.1 mmol/L    CHLORIDE 96 96 - 111 mmol/L    CO2  TOTAL 28 22 - 30 mmol/L    ANION GAP 12 4 - 13 mmol/L    CALCIUM 8.6 8.5 - 10.0 mg/dL    GLUCOSE 159 (H) 65 - 125 mg/dL    BUN 21 8 - 25 mg/dL    CREATININE 3.72 (H) 0.75 - 1.35 mg/dL    BUN/CREA RATIO 6 6 - 22    ESTIMATED GFR 19 (L) >=60 mL/min/BSA   CBC WITH DIFF   Result Value Ref Range    WBC 11.8 (H) 3.7 - 11.0 x10^3/uL    RBC 2.97 (L) 4.50 - 6.10 x10^6/uL    HGB 8.0 (L) 13.4 - 17.5 g/dL    HCT 26.3 (L) 38.9 - 52.0 %    MCV 88.6 78.0 - 100.0 fL    MCH 26.9 26.0 - 32.0 pg    MCHC 30.4 (L) 31.0 - 35.5 g/dL    RDW-CV 14.9 11.5 - 15.5 %    PLATELETS 451 (H) 150 - 400 x10^3/uL    MPV 9.4 8.7 - 12.5 fL    NEUTROPHIL % 62 %    LYMPHOCYTE % 16 %    MONOCYTE % 20 %    EOSINOPHIL % 1 %    BASOPHIL % 0 %    NEUTROPHIL # 7.28 1.50 - 7.70 x10^3/uL  LYMPHOCYTE # 1.88 1.00 - 4.80 x10^3/uL    MONOCYTE # 2.32 (H) 0.20 - 1.10 x10^3/uL    EOSINOPHIL # 0.16 <=0.50 x10^3/uL    BASOPHIL # <0.10 <=0.20 x10^3/uL    IMMATURE GRANULOCYTE % 1 0 - 1 %    IMMATURE GRANULOCYTE # <0.10 <0.10 x10^3/uL   POC BLOOD GLUCOSE (RESULTS)   Result Value Ref Range    GLUCOSE, POC 184 Fasting: 80-130 mg/dL; 2 HR PC: <180 mg/dL mg/dl   POC BLOOD GLUCOSE (RESULTS)   Result Value Ref Range    GLUCOSE, POC 148 Fasting: 80-130 mg/dL; 2 HR PC: <180 mg/dL mg/dl       Labs:     Lab Results   Component Value Date    HA1C 5.6 02/28/2021    LDLCHOL  08/26/2019      Comment:      CALCULATION FOR LDL/VLDL-CHOLESTEROL IS INACCURATE WHEN  TRIGLYCERIDE RESULT IS GREATER THAN 400 mg/dL  <100 mg/dL, Optimal  100-129 mg/dL, Near/Above Optimal  130-159 mg/dL, Borderline High  160-189 mg/dL, High  >=190 mg/dL, Very high    TRIG 424 (H) 08/26/2019    HDLCHOL 24 (L) 08/26/2019    CHOLESTEROL 185 08/26/2019      Lab Results   Component Value Date    SODIUM 136 04/01/2021    POTASSIUM 4.4 04/01/2021    CHLORIDE 96 04/01/2021    CO2 28 04/01/2021    BUN 21 04/01/2021    CREATININE 3.72 (H) 04/01/2021    GFR 19 (L) 04/01/2021    CALCIUM 8.6 04/01/2021       I REVIEWED THE  PERTINENT LABS.  Imaging:    Assessment:    Assessment/Plan   1. Chest pain at rest    2. ESRD on dialysis (CMS HCC)    3. Hyperkalemia             Plan:    Orders Placed This Encounter   . XR AP MOBILE CHEST   . CANCELED: XR AP MOBILE CHEST   . TROPONIN-I (SERIAL TROPONINS)   . CBC/DIFF   . COMPREHENSIVE METABOLIC PANEL, NON-FASTING   . LDL CHOLESTEROL, DIRECT   . PT/INR   . PTT (PARTIAL THROMBOPLASTIN TIME)   . CBC WITH DIFF   . MANUAL DIFF AND MORPHOLOGY-SYSMEX   . HEPATITIS B SURFACE ANTIGEN   . TROPONIN-I every 3 hours with EKG   . CBC/DIFF   . COMPREHENSIVE METABOLIC PANEL, NON-FASTING   . MAGNESIUM   . PHOSPHORUS   . TROPONIN-I   . PT/INR daily x 7   . CBC - BASELINE   . CANCELED: CBC - DAY 1   . CANCELED: CBC - DAY 2   . CBC - DAY 3   . OCCULT BLOOD, STOOL   . CBC WITH DIFF   . CBC/DIFF - AM ONCE   . BASIC METABOLIC PANEL - AM ONCE   . CBC WITH DIFF   . OXYGEN PROTOCOL   . OXYGEN PROTOCOL   . ECG 12 LEAD- TIME WITH TROPONINS   . ECG 12 LEAD   . ECG 12 LEAD   . INSERT & MAINTAIN PERIPHERAL IV ACCESS   . PERIPHERAL IV DRESSING CHANGE   . INSERT & MAINTAIN PERIPHERAL IV ACCESS   . PERIPHERAL IV DRESSING CHANGE   . HEMODIALYSIS   . DIALYSIS NURSE NOTIFICATION   . CANCELED: HEMODIALYSIS   . DIALYSIS NURSE NOTIFICATION   . HEMODIALYSIS   . HEMODIALYSIS   . DIALYSIS NURSE NOTIFICATION   .  CANCELED: PATIENT CLASS/LEVEL OF CARE DESIGNATION - [facility]   . PT CLASS/LEVEL OF CARE CLARIFICATION   . MYOCARDIAL PERFUSION COMPLETE   . NS flush syringe   . NS flush syringe   . fentaNYL (SUBLIMAZE) 50 mcg/mL injection   . ondansetron (ZOFRAN) 2 mg/mL injection   . heparin 1,000 units/mL injection IV bolus   . heparin 1,000 units/mL injection IV bolus   . epoetin alfa (PROCRIT) 20,000 units/mL injection   . paricalcitol (ZEMPLAR) 5 mcg/mL injection   . carvedilol (COREG) tablet   . ranolazine (RANEXA) extended release tablet   . gabapentin (NEURONTIN) capsule   . atorvastatin (LIPITOR) tablet   . amLODIPine (NORVASC) tablet   .  sevelamer carbonate (RENVELA) tablet   . sodium zirconium cyclosilicate (LOKELMA) powder   . ferrous sulfate 325 mg (elemental IRON 65 mg) tablet   . docusate sodium (COLACE) capsule   . polyethylene glycol (MIRALAX) oral packet   . ezetimibe (ZETIA) tablet   . torsemide (DEMADEX) tablet   . oxyCODONE (ROXICODONE) immediate release tablet   . pantoprazole (PROTONIX) delayed release tablet   . DULoxetine (CYMBALTA) delayed release capsule   . cyclobenzaprine (FLEXERIL) tablet   . isosorbide mononitrate (IMDUR) 24 hr extended release tablet   . magnesium hydroxide (MILK OF MAGNESIA) 400mg  per 70mL oral liquid   . ondansetron (ZOFRAN) 2 mg/mL injection   . NS flush syringe   . NS flush syringe   . aspirin chewable tablet 81 mg   . Warfarin - Pharmacist to Dose per Protocol   . correctional insulin lispro (HUMALOG) 100 units/mL injection   . insulin glargine-yfgn 100 Units/mL SubQ pen   . warfarin (COUMADIN) tablet   . warfarin (COUMADIN) tablet   . magnesium oxide (MAG-OX) 400mg  (240mg  elemental magnesium) tablet   . warfarin (COUMADIN) tablet   . regadenoson (LEXISCAN) injection 0.4 mg   . heparin 1,000 units/mL injection IV bolus   . epoetin alfa (PROCRIT) 20,000 units/mL injection   . paricalcitol (ZEMPLAR) 5 mcg/mL injection     Coronary disease status post PCI  Hypertension   hyperlipidemia  diabetes,  End-stage renal disease on dialysis X 3 days/ week for last 3 months.    Troponins mildly elevated.  EKG showed no significant change.  Stress test showed small area of ischemia.    Plan:  --correct anemia.  -continue aspirin, statin beta-blockers.  -continue beta-blockers,  -continue Ranexa  -continue Imdur  - DAPT  Challenge  - if H/H remains stable, will plan for LHC on Monday .   - will continue f/u .      The patient was given chance to ask questions regarding medical care.  I have addressed all the questions appropriately.  Patient  understood the discussion and  agrees with the plan.    Thank you very much  for involving me in the care of this patient.  Please feel free to call me if I can be of any further assistance.    Electronically signed by     Inocencio Homes) Lissa Hoard, MD, [facility].  Interventional Cardiology (ABIM)  CBCCT, CBNC, RPVI, ABVM, NBE.   04/01/2021, 13:28    This note may have been partially generated using MModal Fluency Direct system, and there may be some incorrect words, spellings, and punctuation that were not noted in checking the note before saving, though effort was made to avoid such errors.

## 2021-04-01 NOTE — Progress Notes (Signed)
.      [facility]  Progress Note    Max [name].  Date of service: 04/01/2021  Date of Admission:  03/30/2021  Hospital Day:  LOS: 1 day     Subjective:  The patient was seen examined this a.m..  No acute events overnight.  He continues to have intermittent chest pain.  However it does not seems too concerning because he asked me where he could order intake out when I told him I do not suggest he do that because I like to watch his salt and fluid intake he then wanted to have a 2nd tray.  He did like when he got to eat earlier.    Review of Systems:    A complete review of systems was done and negative unless stated above      Objective:      Vital Signs:  Vitals:    03/31/21 2334 04/01/21 0334 04/01/21 0429 04/01/21 0630   BP: 96/64 102/67     Pulse: 73 71     Resp: 16 16 15     Temp: 36.6 C (97.9 F) 36.7 C (98.1 F)     SpO2: 100% 93%     Weight:    87.2 kg (192 lb 3.2 oz)   Height:       BMI:                I/O:  I/O last 24 hours:      Intake/Output Summary (Last 24 hours) at 04/01/2021 1408  Last data filed at 04/01/2021 0600  Gross per 24 hour   Intake 310 ml   Output 2000 ml   Net -1690 ml         atorvastatin (LIPITOR) tablet, 80 mg, Oral, QPM  carvedilol (COREG) tablet, 25 mg, Oral, 2x/day-Food  correctional insulin lispro (HUMALOG) 100 units/mL injection, 2-10 Units, Subcutaneous, 3x/day AC  cyclobenzaprine (FLEXERIL) tablet, 5 mg, Oral, 3x/day PRN  docusate sodium (COLACE) capsule, 100 mg, Oral, 2x/day  DULoxetine (CYMBALTA) delayed release capsule, 60 mg, Oral, Daily  epoetin alfa (PROCRIT) 20,000 units/mL injection, 150 Units/kg (Adjusted), Intravenous, Give in Dialysis  ezetimibe (ZETIA) tablet, 10 mg, Oral, QPM  ferrous sulfate 325 mg (elemental IRON 65 mg) tablet, 325 mg, Oral, Daily  gabapentin (NEURONTIN) capsule, 400 mg, Oral, 3x/day  heparin 1,000 units/mL injection IV bolus, 1-5 mL, Intercatheter, Dialysis x1 PRN  insulin glargine-yfgn 100 Units/mL SubQ pen, 8 Units,  Subcutaneous, 2x/day  isosorbide mononitrate (IMDUR) 24 hr extended release tablet, 60 mg, Oral, QAM  magnesium hydroxide (MILK OF MAGNESIA) 400mg  per 74mL oral liquid, 15 mL, Oral, Daily PRN  magnesium oxide (MAG-OX) 400mg  (240mg  elemental magnesium) tablet, 400 mg, Oral, 2x/day  NS flush syringe, 3 mL, Intracatheter, Q8HRS  NS flush syringe, 3 mL, Intracatheter, Q1H PRN  NS flush syringe, 3 mL, Intracatheter, Q8HRS  NS flush syringe, 3 mL, Intracatheter, Q1H PRN  ondansetron (ZOFRAN) 2 mg/mL injection, 4 mg, Intravenous, Q6H PRN  oxyCODONE (ROXICODONE) immediate release tablet, 5 mg, Oral, Q6H PRN  pantoprazole (PROTONIX) delayed release tablet, 40 mg, Oral, Daily  paricalcitol (ZEMPLAR) 5 mcg/mL injection, 2 mcg, Intravenous, Give in Dialysis  polyethylene glycol (MIRALAX) oral packet, 17 g, Oral, Daily  ranolazine (RANEXA) extended release tablet, 500 mg, Oral, 2x/day  sevelamer carbonate (RENVELA) tablet, 800 mg, Oral, 3x/day-Meals  sodium zirconium cyclosilicate (LOKELMA) powder, 10 g, Oral, Daily  torsemide (DEMADEX) tablet, 20 mg, Oral, 2x/day  warfarin (COUMADIN) tablet, 3 mg, Oral, Once 2100  Warfarin - Pharmacist to Dose per Protocol, , Does not apply, Daily PRN        Physical Exam:    Constitutional.  Chronically ill no acute distress sitting up on room air   HEENT normocephalic atraumatic extraocular muscle intact  Cardiovascular heart regular rate and rhythm normal S1-S2 no murmurs rubs or gallops   Respiratory good air movement bilaterally no wheezes rales or rhonchi  GI/GU soft nontender nondistended normal bowel sounds   Musculoskeletal no clubbing or cyanosis AKA of the right lower extremity  Neuro alert and oriented x3   Psych cooperative but flat affect      Labs:  Results for orders placed or performed during the hospital encounter of 03/30/21 (from the past 24 hour(s))   PT/INR daily x 7   Result Value Ref Range    PROTHROMBIN TIME 22.5 (H) 9.7 - 13.6 seconds    INR 1.87 <=5.00   CBC/DIFF - AM  ONCE    Narrative    The following orders were created for panel order CBC/DIFF - AM ONCE.  Procedure                               Abnormality         Status                     ---------                               -----------         ------                     CBC WITH WRUE[[id number]]                Abnormal            Final result                 Please view results for these tests on the individual orders.   BASIC METABOLIC PANEL - AM ONCE   Result Value Ref Range    SODIUM 136 136 - 145 mmol/L    POTASSIUM 4.4 3.5 - 5.1 mmol/L    CHLORIDE 96 96 - 111 mmol/L    CO2 TOTAL 28 22 - 30 mmol/L    ANION GAP 12 4 - 13 mmol/L    CALCIUM 8.6 8.5 - 10.0 mg/dL    GLUCOSE 159 (H) 65 - 125 mg/dL    BUN 21 8 - 25 mg/dL    CREATININE 3.72 (H) 0.75 - 1.35 mg/dL    BUN/CREA RATIO 6 6 - 22    ESTIMATED GFR 19 (L) >=60 mL/min/BSA   CBC WITH DIFF   Result Value Ref Range    WBC 11.8 (H) 3.7 - 11.0 x10^3/uL    RBC 2.97 (L) 4.50 - 6.10 x10^6/uL    HGB 8.0 (L) 13.4 - 17.5 g/dL    HCT 26.3 (L) 38.9 - 52.0 %    MCV 88.6 78.0 - 100.0 fL    MCH 26.9 26.0 - 32.0 pg    MCHC 30.4 (L) 31.0 - 35.5 g/dL    RDW-CV 14.9 11.5 - 15.5 %    PLATELETS 451 (H) 150 - 400 x10^3/uL    MPV 9.4 8.7 - 12.5 fL    NEUTROPHIL % 62 %    LYMPHOCYTE %  16 %    MONOCYTE % 20 %    EOSINOPHIL % 1 %    BASOPHIL % 0 %    NEUTROPHIL # 7.28 1.50 - 7.70 x10^3/uL    LYMPHOCYTE # 1.88 1.00 - 4.80 x10^3/uL    MONOCYTE # 2.32 (H) 0.20 - 1.10 x10^3/uL    EOSINOPHIL # 0.16 <=0.50 x10^3/uL    BASOPHIL # <0.10 <=0.20 x10^3/uL    IMMATURE GRANULOCYTE % 1 0 - 1 %    IMMATURE GRANULOCYTE # <0.10 <0.10 x10^3/uL   POC BLOOD GLUCOSE (RESULTS)   Result Value Ref Range    GLUCOSE, POC 184 Fasting: 80-130 mg/dL; 2 HR PC: <180 mg/dL mg/dl   POC BLOOD GLUCOSE (RESULTS)   Result Value Ref Range    GLUCOSE, POC 148 Fasting: 80-130 mg/dL; 2 HR PC: <180 mg/dL mg/dl            Imaging:           Microbiology:  No results found for any visits on 03/30/21 (from the past 96  hour(s)).        Assessment/ Plan:   Active Hospital Problems    Diagnosis   . Primary Problem: Chest pain at rest   . End stage renal disease on dialysis (CMS HCC)   . Cardiovascular disease   . History of right below knee amputation (CMS HCC)   . Hyperkalemia   . Anemia      Chest pain.  Patient has a history of CAD status post multiple PCI.  EKG showed some lateral T-wave changes and troponins were flat trend.  Continue Coreg Imdur.  Continue aspirin.  Continue Ranexa.  New med stress test showed a small reversible defect.  Started heparin drip stopped warfarin.  Find optimize anemia and planning for left heart catheterization on Monday.    End-stage renal disease on hemodialysis.  Noncompliant with dialysis.  Nephrology consulted.      History of DVT.  I last saw the patient 2021 he had a history of DVT not on anticoagulation.  Now he is on anticoagulation.  I do not see any vascular ultrasounds that were done within the system during that time.   holding warfarin for now.    Type 2 diabetes.  Continue basal bolus insulin.    Hyperlipidemia/peripheral vascular disease.  Continue aspirin statin and Zetia.    Full code   DVT prophylaxis heparin   Dispo at least to Monday of next week.    Rockne Coons, DO    This note was partially generated using MModal Fluency Direct system, and there may be some incorrect words, spellings, and punctuation that were not noted in checking the note before saving

## 2021-04-01 NOTE — Care Management Notes (Signed)
[facility]  Care Management Note    Patient Name: Max [name].  Date of Birth: 03-30-1970  Sex: male  Date/Time of Admission: 03/30/2021  2:32 PM  Room/Bed: 311/B  Payor: HU[employer]NA MEDICARE / Plan: HUMANA MEDICARE / Product Type: MEDICARE [facility] /    LOS: 1 day   PCP: Azzie Roup, MD    Admitting Diagnosis:  Chest pain at rest [R07.9]    Assessment:   I let Matt know at [facility] if patient has a negative stress test that he could return to [facility] today.  Matt said he would need a MAR in allscripts.    15:10  I let Matt with [facility] know that patient is going to have a heart cath on Monday because of stress test today.    Discharge Plan:  SNF Return (Medicare certified) (code 3)      The patient will continue to be evaluated for developing discharge needs.     Case Manager: Lynne Leader, CASE MANAGER  Phone: [phone]

## 2021-04-01 NOTE — Progress Notes (Unsigned)
PATIENT NAME: Max [name], Max [name]   HOSPITAL NUMBER:  [id number]  DATE OF SERVICE: 04/01/2021  DATE OF BIRTH:  09-Jun-1970    PROGRESS NOTE    The patient is on dialysis Monday, Wednesday, and Friday.  From [facility].  We did dialysis yesterday.  The patient also having chest pain and has been seen by cardiology.  Cardiac stress test has been ordered.  The patient is status post right-sided below-the-knee amputation, has end-stage renal disease, and anemia.  He has history of DVT.  He has type 2 diabetes hyperlipidemia.    The plan is to proceed with a cardiac stress test.  He has a Permcath for dialysis.  End-stage renal disease due to hypertension and diabetes.  History of coronary artery disease with 12 stents.  Cardiology is on the case.  Troponin is stable.  Potassium is okay.  He had dialysis yesterday.  He looks euvolemic.      Lungs are clear.  Heart is regular rate and rhythm.  Abdomen is soft and nontender.  Bowel sounds are present.  Extremities show no edema.  Vital signs looks good.  Phosphorus is 5.1.  We will do dialysis tomorrow.        Rondel Oh, DO                DD:  04/01/2021 12:06:18  DT:  04/01/2021 22:30:17 [place]  D#:  [id number]

## 2021-04-02 DIAGNOSIS — E785 Hyperlipidemia, unspecified: Secondary | ICD-10-CM

## 2021-04-02 DIAGNOSIS — R079 Chest pain, unspecified: Secondary | ICD-10-CM

## 2021-04-02 DIAGNOSIS — E1122 Type 2 diabetes mellitus with diabetic chronic kidney disease: Secondary | ICD-10-CM

## 2021-04-02 DIAGNOSIS — Z9115 Patient's noncompliance with renal dialysis: Secondary | ICD-10-CM

## 2021-04-02 DIAGNOSIS — I739 Peripheral vascular disease, unspecified: Secondary | ICD-10-CM

## 2021-04-02 DIAGNOSIS — Z992 Dependence on renal dialysis: Secondary | ICD-10-CM

## 2021-04-02 DIAGNOSIS — I2510 Atherosclerotic heart disease of native coronary artery without angina pectoris: Secondary | ICD-10-CM

## 2021-04-02 DIAGNOSIS — Z7901 Long term (current) use of anticoagulants: Secondary | ICD-10-CM

## 2021-04-02 DIAGNOSIS — Z86718 Personal history of other venous thrombosis and embolism: Secondary | ICD-10-CM

## 2021-04-02 DIAGNOSIS — N186 End stage renal disease: Secondary | ICD-10-CM

## 2021-04-02 DIAGNOSIS — D638 Anemia in other chronic diseases classified elsewhere: Secondary | ICD-10-CM

## 2021-04-02 LAB — VITAMIN B12: VITAMIN B 12: 417 pg/mL (ref 200–900)

## 2021-04-02 LAB — CBC
HCT: 25.6 % — ABNORMAL LOW (ref 38.9–52.0)
HGB: 7.8 g/dL — ABNORMAL LOW (ref 13.4–17.5)
MCH: 26.5 pg (ref 26.0–32.0)
MCHC: 30.5 g/dL — ABNORMAL LOW (ref 31.0–35.5)
MCV: 87.1 fL (ref 78.0–100.0)
MPV: 9.4 fL (ref 8.7–12.5)
PLATELETS: 440 10*3/uL — ABNORMAL HIGH (ref 150–400)
RBC: 2.94 10*6/uL — ABNORMAL LOW (ref 4.50–6.10)
RDW-CV: 14.9 % (ref 11.5–15.5)
WBC: 13.7 10*3/uL — ABNORMAL HIGH (ref 3.7–11.0)

## 2021-04-02 LAB — BPAM PACKED CELL ORDER

## 2021-04-02 LAB — FOLATE: FOLATE: 5.4 ng/mL — ABNORMAL LOW (ref 7.0–31.0)

## 2021-04-02 LAB — BASIC METABOLIC PANEL
ANION GAP: 17 mmol/L — ABNORMAL HIGH (ref 4–13)
BUN/CREA RATIO: 5 — ABNORMAL LOW (ref 6–22)
BUN: 27 mg/dL — ABNORMAL HIGH (ref 8–25)
CALCIUM: 8.5 mg/dL (ref 8.5–10.0)
CHLORIDE: 92 mmol/L — ABNORMAL LOW (ref 96–111)
CO2 TOTAL: 28 mmol/L (ref 22–30)
CREATININE: 4.93 mg/dL — ABNORMAL HIGH (ref 0.75–1.35)
ESTIMATED GFR: 14 mL/min/BSA — ABNORMAL LOW (ref >=60)
GLUCOSE: 156 mg/dL — ABNORMAL HIGH (ref 65–125)
POTASSIUM: 4.6 mmol/L (ref 3.5–5.1)
SODIUM: 137 mmol/L (ref 136–145)

## 2021-04-02 LAB — POC BLOOD GLUCOSE (RESULTS)
GLUCOSE, POC: 172 mg/dl (ref 80–130)
GLUCOSE, POC: 228 mg/dl (ref 80–130)
GLUCOSE, POC: 216 mg/dl (ref 80–130)

## 2021-04-02 LAB — PT/INR
INR: 2.14 (ref <=5.00)
PROTHROMBIN TIME: 25.9 seconds — ABNORMAL HIGH (ref 9.7–13.6)

## 2021-04-02 LAB — PTT (PARTIAL THROMBOPLASTIN TIME)
APTT: 57.1 seconds — ABNORMAL HIGH (ref 26.0–39.0)
APTT: 49.5 seconds — ABNORMAL HIGH (ref 26.0–39.0)
APTT: 52.3 seconds — ABNORMAL HIGH (ref 26.0–39.0)

## 2021-04-02 LAB — TYPE AND CROSS RED CELLS - UNITS: ABO/RH(D): O POS

## 2021-04-02 MED ORDER — LIDOCAINE 3.6 %-MENTHOL 1.25 % TOPICAL PATCH
1.0000 | MEDICATED_PATCH | Freq: Every day | CUTANEOUS | Status: DC
Start: 2021-04-02 — End: 2021-04-06
  Administered 2021-04-02 – 2021-04-06 (×5): 1 via TRANSDERMAL
  Filled 2021-04-02 (×5): qty 1

## 2021-04-02 NOTE — Nurses Notes (Signed)
Per verbal order from Dr Abran Richard at bedside, she ok to stop heparin for patient to take bath & then to restart heparin when done. Patient also requested that I remove lidocane patch from L shoulder blade. Basil Dess, RN  04/02/2021, 10:10

## 2021-04-02 NOTE — Care Plan (Signed)
Problem: Adult Inpatient Plan of Care  Goal: Patient-Specific Goal (Individualized)  Outcome: Ongoing (see interventions/notes)  Flowsheets (Taken 04/02/2021 1603)  Individualized Care Needs: pain management  Anxieties, Fears or Concerns: When is my dialysis? I want to take a shower.  Patient-Specific Goals (Include Timeframe): pain management, dressing changes     Patient alert and oriented x 4 during shift. Heparin at 14 units/kg/hr with no therapeutic results. Patient requested dressings to be changed again, as patient stated they were too big. LPN changed dressings. Patient complained of generalized pain during shift. Bed alarm and deroyal refused. Safety measures in place.

## 2021-04-02 NOTE — Care Plan (Signed)
Patient is down at dialysis. He is having 2 units replaced. The nurse went down to give him his evening meds and his pain medication. Sallyanne Havers, LPN  2/35/3614, 43:15    Problem: Adult Inpatient Plan of Care  Goal: Plan of Care Review  Outcome: Ongoing (see interventions/notes)  Flowsheets (Taken 04/02/2021 1737)  Progress: no change  Plan of Care Reviewed With: patient  Goal: Patient-Specific Goal (Individualized)  Outcome: Ongoing (see interventions/notes)  Flowsheets (Taken 04/02/2021 1737)  Patient would like to participate in bedside shift report: Yes  Individualized Care Needs: pain management  Anxieties, Fears or Concerns: none voiced  Patient-Specific Goals (Include Timeframe): dressing changes  Plan of Care Reviewed With: patient  Goal: Absence of Hospital-Acquired Illness or Injury  Outcome: Ongoing (see interventions/notes)  Intervention: Prevent Skin Injury  Recent Flowsheet Documentation  Taken 04/02/2021 1600 by Sallyanne Havers, LPN  Body Position: positioned/repositioned independently  Taken 04/02/2021 1400 by Sallyanne Havers, LPN  Body Position: positioned/repositioned independently  Taken 04/02/2021 1200 by Sallyanne Havers, LPN  Body Position: positioned/repositioned independently  Taken 04/02/2021 1000 by Sallyanne Havers, LPN  Body Position: positioned/repositioned independently  Taken 04/02/2021 0800 by Sallyanne Havers, LPN  Body Position: positioned/repositioned independently  Goal: Optimal Comfort and Wellbeing  Outcome: Ongoing (see interventions/notes)  Goal: Rounds/Family Conference  Outcome: Ongoing (see interventions/notes)     Problem: Fall Injury Risk  Goal: Absence of Fall and Fall-Related Injury  Outcome: Ongoing (see interventions/notes)     Problem: Skin Injury Risk Increased  Goal: Skin Health and Integrity  Outcome: Ongoing (see interventions/notes)     Problem: Chest Pain  Goal: Resolution of Chest Pain Symptoms  Outcome: Ongoing (see interventions/notes)     Problem: Gas Exchange  Impaired  Goal: Optimal Gas Exchange  Outcome: Ongoing (see interventions/notes)

## 2021-04-02 NOTE — Nurses Notes (Signed)
H2O alarm active. Machine set and tested as ordered. Patient to treatment room. Verbal consent obtained. Assessment completed. Patient denies needs. Right tunneled CVC prepped and accessed. Wasted 5ml blood x 2 ports. Treatment initiated at this time. Blood lines secured to patient. Will continue to monitor patient during treatment.

## 2021-04-02 NOTE — Nurses Notes (Signed)
Hemodialysis treatment completed at this time with 3 kg of fluid removed, patient tolerated treatment well. Received 2 units of PRBC's with no reaction noted. Blood returned without difficulty. CVC flushed with saline and locked with heparin. Blue sterile caps applied. Patient denies needs. Report to primary RN. Patient to return to floor via transport.

## 2021-04-02 NOTE — Care Plan (Signed)
Problem: Adult Inpatient Plan of Care  Goal: Plan of Care Review  Outcome: Ongoing (see interventions/notes)  Goal: Patient-Specific Goal (Individualized)  Outcome: Ongoing (see interventions/notes)  Goal: Absence of Hospital-Acquired Illness or Injury  Outcome: Ongoing (see interventions/notes)  Intervention: Identify and Manage Fall Risk  Recent Flowsheet Documentation  Taken 04/01/2021 2020 by Lina Sar, RN  Safety Promotion/Fall Prevention:   activity supervised   fall prevention program maintained   muscle strengthening facilitated   motion sensor pad activated   safety round/check completed   toileting scheduled  Intervention: Prevent Skin Injury  Recent Flowsheet Documentation  Taken 04/02/2021 0400 by Lina Sar, RN  Body Position: positioned/repositioned independently  Taken 04/02/2021 0200 by Lina Sar, RN  Body Position: positioned/repositioned independently  Taken 04/01/2021 2020 by Lina Sar, RN  Body Position: positioned/repositioned independently  Skin Protection: adhesive use limited  Intervention: Prevent and Manage VTE (Venous Thromboembolism) Risk  Recent Flowsheet Documentation  Taken 04/01/2021 2020 by Lina Sar, RN  VTE Prevention/Management: anticoagulant therapy maintained  Intervention: Prevent Infection  Recent Flowsheet Documentation  Taken 04/01/2021 2020 by Lina Sar, RN  Infection Prevention: barrier precautions utilized  Goal: Optimal Comfort and Wellbeing  Outcome: Ongoing (see interventions/notes)  Intervention: Provide Person-Centered Care  Recent Flowsheet Documentation  Taken 04/01/2021 2020 by Lina Sar, RN  Trust Relationship/Rapport: care explained  Goal: Rounds/Family Conference  Outcome: Ongoing (see interventions/notes)     Problem: Fall Injury Risk  Goal: Absence of Fall and Fall-Related Injury  Outcome: Ongoing (see interventions/notes)  Intervention: Promote Injury-Free Environment  Recent Flowsheet Documentation  Taken 04/01/2021 2020 by Lina Sar, RN  Safety Promotion/Fall Prevention:   activity supervised   fall prevention program maintained   muscle strengthening facilitated   motion sensor pad activated   safety round/check completed   toileting scheduled     Problem: Skin Injury Risk Increased  Goal: Skin Health and Integrity  Outcome: Ongoing (see interventions/notes)  Intervention: Optimize Skin Protection  Recent Flowsheet Documentation  Taken 04/01/2021 2020 by Lina Sar, RN  Pressure Reduction Techniques: frequent weight shift encouraged  Pressure Reduction Devices: (M,H,VH) Use Repositioning Devices or Pillows  Skin Protection: adhesive use limited  Activity Management: activity adjusted per tolerance  Head of Bed (HOB) Positioning: HOB elevated     Problem: Chest Pain  Goal: Resolution of Chest Pain Symptoms  Outcome: Ongoing (see interventions/notes)     Problem: Gas Exchange Impaired  Goal: Optimal Gas Exchange  Outcome: Ongoing (see interventions/notes)  Intervention: Optimize Oxygenation and Ventilation  Recent Flowsheet Documentation  Taken 04/01/2021 2020 by Lina Sar, RN  Head of Bed [facility]) Positioning: HOB elevated   Rested well, still complained for pain, prn given 2x, and verbalized relieved after an hour.  Pt signed the blood transfusion consent for blood transfusion during the HD today. Will contniue to monitor.    Lina Sar, RN  04/02/2021, 06:54

## 2021-04-02 NOTE — Progress Notes (Signed)
.      [facility]  Progress Note    Max [name].  Date of service: 04/02/2021  Date of Admission:  03/30/2021  Hospital Day:  LOS: 2 days     Subjective:  The patient was seen examined this a.m..  No acute events overnight.  Denies any chest pain this [name] but is very irritated because he wants the heparin drip off so he can take a shower and change his shirt.  Since his INR was therapeutic today even though will not be tomorrow I said we could turn off the drip long enough for him to change into a gown.    Review of Systems:    A complete review of systems was done and negative unless stated above      Objective:      Vital Signs:  Vitals:    04/02/21 1617 04/02/21 1635 04/02/21 1659 04/02/21 1715   BP: 121/74 127/76 128/64 127/72   Pulse: 76 77 76 78   Resp: 16 16 16 16    Temp:  36.6 C (97.9 F) 36.6 C (97.8 F) 36.6 C (97.9 F)   SpO2:       Weight:       Height:       BMI:                I/O:  I/O last 24 hours:      Intake/Output Summary (Last 24 hours) at 04/02/2021 1727  Last data filed at 04/02/2021 1715  Gross per 24 hour   Intake 867.94 ml   Output --   Net 867.94 ml         atorvastatin (LIPITOR) tablet, 80 mg, Oral, QPM  carvedilol (COREG) tablet, 25 mg, Oral, 2x/day-Food  correctional insulin lispro (HUMALOG) 100 units/mL injection, 2-10 Units, Subcutaneous, 3x/day AC  cyclobenzaprine (FLEXERIL) tablet, 5 mg, Oral, 3x/day PRN  docusate sodium (COLACE) capsule, 100 mg, Oral, 2x/day  DULoxetine (CYMBALTA) delayed release capsule, 60 mg, Oral, Daily  epoetin alfa (PROCRIT) 20,000 units/mL injection, 150 Units/kg (Adjusted), Intravenous, Give in Dialysis  ezetimibe (ZETIA) tablet, 10 mg, Oral, QPM  ferrous sulfate 325 mg (elemental IRON 65 mg) tablet, 325 mg, Oral, Daily  gabapentin (NEURONTIN) capsule, 400 mg, Oral, 3x/day  heparin 1,000 units/mL injection IV bolus, 1-5 mL, Intercatheter, Dialysis x1 PRN  heparin 25,000 units in 0.45% NS 250 mL infusion, 12 Units/kg/hr (Adjusted),  Intravenous, Continuous  insulin glargine-yfgn 100 Units/mL SubQ pen, 8 Units, Subcutaneous, 2x/day  isosorbide mononitrate (IMDUR) 24 hr extended release tablet, 60 mg, Oral, QAM  lidocaine-menthol (LIDOPATCH) 3.6%-1.25% patch, 1 Patch, Transdermal, Daily  magnesium hydroxide (MILK OF MAGNESIA) 400mg  per 21mL oral liquid, 15 mL, Oral, Daily PRN  magnesium oxide (MAG-OX) 400mg  (240mg  elemental magnesium) tablet, 400 mg, Oral, 2x/day  NS flush syringe, 3 mL, Intracatheter, Q8HRS  NS flush syringe, 3 mL, Intracatheter, Q1H PRN  NS flush syringe, 3 mL, Intracatheter, Q8HRS  NS flush syringe, 3 mL, Intracatheter, Q1H PRN  ondansetron (ZOFRAN) 2 mg/mL injection, 4 mg, Intravenous, Q6H PRN  oxyCODONE (ROXICODONE) immediate release tablet, 5 mg, Oral, Q6H PRN  pantoprazole (PROTONIX) delayed release tablet, 40 mg, Oral, Daily  paricalcitol (ZEMPLAR) 5 mcg/mL injection, 2 mcg, Intravenous, Give in Dialysis  polyethylene glycol (MIRALAX) oral packet, 17 g, Oral, Daily  ranolazine (RANEXA) extended release tablet, 500 mg, Oral, 2x/day  sevelamer carbonate (RENVELA) tablet, 800 mg, Oral, 3x/day-Meals  sodium zirconium cyclosilicate (LOKELMA) powder, 10 g, Oral, Daily  torsemide (DEMADEX)  tablet, 20 mg, Oral, 2x/day  [Held by provider] Warfarin - Pharmacist to Dose per Protocol, , Does not apply, Daily PRN        Physical Exam:    Constitutional.  Chronically ill no acute distress middle-aged male looks older than stated age   64 normocephalic atraumatic extraocular muscle intact   Cardiovascular regular rate rhythm normal S1-S2   Respiratory good air movement bilaterally no wheezes rales or rhonchi  GI/GU soft nontender nondistended normal bowel sounds  Musculoskeletal no clubbing cyanosis or edema   Neuro alert orient x3 no focal deficits   Psych cooperative agitated      Labs:  Results for orders placed or performed during the hospital encounter of 03/30/21 (from the past 24 hour(s))   CBC - DAY 3   Result Value Ref Range     WBC 11.8 (H) 3.7 - 11.0 x10^3/uL    RBC 2.96 (L) 4.50 - 6.10 x10^6/uL    HGB 8.0 (L) 13.4 - 17.5 g/dL    HCT 25.8 (L) 38.9 - 52.0 %    MCV 87.2 78.0 - 100.0 fL    MCH 27.0 26.0 - 32.0 pg    MCHC 31.0 31.0 - 35.5 g/dL    RDW-CV 15.0 11.5 - 15.5 %    PLATELETS 440 (H) 150 - 400 x10^3/uL    MPV 9.4 8.7 - 12.5 fL   PTT (PARTIAL THROMBOPLASTIN TIME)   Result Value Ref Range    APTT 53.5 (H) 26.0 - 39.0 seconds   PT/INR daily x 7   Result Value Ref Range    PROTHROMBIN TIME 25.9 (H) 9.7 - 13.6 seconds    INR 2.14 <=5.00   CBC   Result Value Ref Range    WBC 13.7 (H) 3.7 - 11.0 x10^3/uL    RBC 2.94 (L) 4.50 - 6.10 x10^6/uL    HGB 7.8 (L) 13.4 - 17.5 g/dL    HCT 25.6 (L) 38.9 - 52.0 %    MCV 87.1 78.0 - 100.0 fL    MCH 26.5 26.0 - 32.0 pg    MCHC 30.5 (L) 31.0 - 35.5 g/dL    RDW-CV 14.9 11.5 - 15.5 %    PLATELETS 440 (H) 150 - 400 x10^3/uL    MPV 9.4 8.7 - 12.5 fL   BASIC METABOLIC PANEL - AM ONCE   Result Value Ref Range    SODIUM 137 136 - 145 mmol/L    POTASSIUM 4.6 3.5 - 5.1 mmol/L    CHLORIDE 92 (L) 96 - 111 mmol/L    CO2 TOTAL 28 22 - 30 mmol/L    ANION GAP 17 (H) 4 - 13 mmol/L    CALCIUM 8.5 8.5 - 10.0 mg/dL    GLUCOSE 156 (H) 65 - 125 mg/dL    BUN 27 (H) 8 - 25 mg/dL    CREATININE 4.93 (H) 0.75 - 1.35 mg/dL    BUN/CREA RATIO 5 (L) 6 - 22    ESTIMATED GFR 14 (L) >=60 mL/min/BSA   PTT (PARTIAL THROMBOPLASTIN TIME)   Result Value Ref Range    APTT 57.1 (H) 26.0 - 39.0 seconds   PTT (PARTIAL THROMBOPLASTIN TIME)   Result Value Ref Range    APTT 49.5 (H) 26.0 - 39.0 seconds   POC BLOOD GLUCOSE (RESULTS)   Result Value Ref Range    GLUCOSE, POC 87 Fasting: 80-130 mg/dL; 2 HR PC: <180 mg/dL mg/dl   POC BLOOD GLUCOSE (RESULTS)   Result Value Ref Range  GLUCOSE, POC 172 Fasting: 80-130 mg/dL; 2 HR PC: <180 mg/dL mg/dl   POC BLOOD GLUCOSE (RESULTS)   Result Value Ref Range    GLUCOSE, POC 228 Fasting: 80-130 mg/dL; 2 HR PC: <180 mg/dL mg/dl            Imaging:           Microbiology:  No results found for any visits on  03/30/21 (from the past 96 hour(s)).        Assessment/ Plan:   Active Hospital Problems    Diagnosis   . Primary Problem: Chest pain at rest   . End stage renal disease on dialysis (CMS HCC)   . Cardiovascular disease   . History of right below knee amputation (CMS HCC)   . Hyperkalemia   . Anemia      Chest pain. Patient has a history of CAD status post multiple PCI. EKG showed some lateral T-wave changes and troponins were flat trend. Continue Coreg Imdur. Continue aspirin.  Continue Ranexa.  Nec med stress test showed a small reversible defect.  Started heparin drip stopped warfarin.  Planning to optimize anemia and  left heart catheterization on Monday.    End-stage renal disease on hemodialysis. Noncompliant with dialysis. Nephrology consulted.     Microcytic anemia.  Patient has anemia of chronic disease.  Will give 1-2 units of packed red blood cells today with hemodialysis.  Transfusion order already in.  Dr. Adriana Simas aware.  Cardiology would like patient's hemoglobin to be around 9 at the time of a left heart catheterization.    History of DVT. I last saw the patient 2021 he had a history of DVT not on anticoagulation. Now he is on anticoagulation. I do not see any vascular ultrasounds that were done within the system during that time.  holding warfarin for now.    Type 2 diabetes. Continue basal bolus insulin.    Hyperlipidemia/peripheral vascular disease. Continue aspirin statin and Zetia.    Full code   DVT prophylaxis heparin   Dispo at least to Monday of next week.    Rockne Coons, DO      This note was partially generated using MModal Fluency Direct system, and there may be some incorrect words, spellings, and punctuation that were not noted in checking the note before saving

## 2021-04-03 LAB — PT/INR
INR: 1.77 (ref <=5.00)
PROTHROMBIN TIME: 21.3 seconds — ABNORMAL HIGH (ref 9.7–13.6)

## 2021-04-03 LAB — PTT (PARTIAL THROMBOPLASTIN TIME)
APTT: 58.3 seconds — ABNORMAL HIGH (ref 26.0–39.0)
APTT: 60.7 seconds — ABNORMAL HIGH (ref 26.0–39.0)
APTT: 46.7 seconds — ABNORMAL HIGH (ref 26.0–39.0)
APTT: 45.5 seconds — ABNORMAL HIGH (ref 26.0–39.0)

## 2021-04-03 LAB — CBC WITH DIFF
BASOPHIL #: <0.1 10*3/uL (ref <=0.20)
BASOPHIL %: 1 %
EOSINOPHIL #: 0.27 10*3/uL (ref <=0.50)
EOSINOPHIL %: 2 %
HCT: 29.1 % — ABNORMAL LOW (ref 38.9–52.0)
HGB: 9.1 g/dL — ABNORMAL LOW (ref 13.4–17.5)
IMMATURE GRANULOCYTE #: <0.1 10*3/uL (ref <0.10)
IMMATURE GRANULOCYTE %: 0 % (ref 0–1)
LYMPHOCYTE #: 1.56 10*3/uL (ref 1.00–4.80)
LYMPHOCYTE %: 12 %
MCH: 27.5 pg (ref 26.0–32.0)
MCHC: 31.3 g/dL (ref 31.0–35.5)
MCV: 87.9 fL (ref 78.0–100.0)
MONOCYTE #: 2.13 10*3/uL — ABNORMAL HIGH (ref 0.20–1.10)
MONOCYTE %: 16 %
MPV: 9.3 fL (ref 8.7–12.5)
NEUTROPHIL #: 9.47 10*3/uL — ABNORMAL HIGH (ref 1.50–7.70)
NEUTROPHIL %: 69 %
PLATELETS: 416 10*3/uL — ABNORMAL HIGH (ref 150–400)
RBC: 3.31 10*6/uL — ABNORMAL LOW (ref 4.50–6.10)
RDW-CV: 14.6 % (ref 11.5–15.5)
WBC: 13.6 10*3/uL — ABNORMAL HIGH (ref 3.7–11.0)

## 2021-04-03 LAB — BASIC METABOLIC PANEL
ANION GAP: 9 mmol/L (ref 4–13)
BUN/CREA RATIO: 5 — ABNORMAL LOW (ref 6–22)
BUN: 16 mg/dL (ref 8–25)
CALCIUM: 8.5 mg/dL (ref 8.5–10.0)
CHLORIDE: 94 mmol/L — ABNORMAL LOW (ref 96–111)
CO2 TOTAL: 31 mmol/L — ABNORMAL HIGH (ref 22–30)
CREATININE: 3.54 mg/dL — ABNORMAL HIGH (ref 0.75–1.35)
ESTIMATED GFR: 20 mL/min/BSA — ABNORMAL LOW (ref >=60)
GLUCOSE: 144 mg/dL — ABNORMAL HIGH (ref 65–125)
POTASSIUM: 4.1 mmol/L (ref 3.5–5.1)
SODIUM: 134 mmol/L — ABNORMAL LOW (ref 136–145)

## 2021-04-03 LAB — POC BLOOD GLUCOSE (RESULTS)
GLUCOSE, POC: 267 mg/dl (ref 80–130)
GLUCOSE, POC: 176 mg/dl (ref 80–130)
GLUCOSE, POC: 196 mg/dl (ref 80–130)
GLUCOSE, POC: 133 mg/dl (ref 80–130)

## 2021-04-03 MED ORDER — ASPIRIN 81 MG TABLET,DELAYED RELEASE
81.0000 mg | DELAYED_RELEASE_TABLET | Freq: Every day | ORAL | Status: DC
Start: 2021-04-03 — End: 2021-04-06
  Administered 2021-04-03 – 2021-04-06 (×4): 81 mg via ORAL
  Filled 2021-04-03 (×4): qty 1

## 2021-04-03 NOTE — Care Plan (Signed)
Pt came back from dialysis on shift change.PTT was drawn right away since they were not able to draw it during the dialysis. V/S were taken and recorded. Rested good but woke up due to pain. Heparin drip continued with PTT at therapeutic x2 so far. Will continue to monitor.    Lina Sar, RN  04/03/2021, 04:43     Problem: Adult Inpatient Plan of Care  Goal: Plan of Care Review  Outcome: Ongoing (see interventions/notes)  Goal: Patient-Specific Goal (Individualized)  Outcome: Ongoing (see interventions/notes)  Goal: Absence of Hospital-Acquired Illness or Injury  Outcome: Ongoing (see interventions/notes)  Intervention: Identify and Manage Fall Risk  Recent Flowsheet Documentation  Taken 04/02/2021 2020 by Lina Sar, RN  Safety Promotion/Fall Prevention:   activity supervised   fall prevention program maintained   muscle strengthening facilitated   motion sensor pad activated   nonskid shoes/slippers when out of bed   safety round/check completed   toileting scheduled  Intervention: Prevent Skin Injury  Recent Flowsheet Documentation  Taken 04/03/2021 0400 by Lina Sar, RN  Body Position: positioned/repositioned independently  Taken 04/03/2021 0200 by Lina Sar, RN  Body Position: positioned/repositioned independently  Taken 04/03/2021 0000 by Lina Sar, RN  Body Position: positioned/repositioned independently  Taken 04/02/2021 2200 by Lina Sar, RN  Body Position: positioned/repositioned independently  Taken 04/02/2021 2020 by Lina Sar, RN  Body Position: positioned/repositioned independently  Intervention: Prevent and Manage VTE (Venous Thromboembolism) Risk  Recent Flowsheet Documentation  Taken 04/02/2021 2020 by Lina Sar, RN  VTE Prevention/Management: anticoagulant therapy maintained  Intervention: Prevent Infection  Recent Flowsheet Documentation  Taken 04/02/2021 2020 by Lina Sar, RN  Infection Prevention: barrier precautions utilized  Goal: Optimal Comfort and Wellbeing  Outcome:  Ongoing (see interventions/notes)  Intervention: Provide Person-Centered Care  Recent Flowsheet Documentation  Taken 04/02/2021 2020 by Lina Sar, RN  Trust Relationship/Rapport: care explained  Goal: Rounds/Family Conference  Outcome: Ongoing (see interventions/notes)     Problem: Fall Injury Risk  Goal: Absence of Fall and Fall-Related Injury  Outcome: Ongoing (see interventions/notes)  Intervention: Promote Injury-Free Environment  Recent Flowsheet Documentation  Taken 04/02/2021 2020 by Lina Sar, RN  Safety Promotion/Fall Prevention:   activity supervised   fall prevention program maintained   muscle strengthening facilitated   motion sensor pad activated   nonskid shoes/slippers when out of bed   safety round/check completed   toileting scheduled     Problem: Skin Injury Risk Increased  Goal: Skin Health and Integrity  Outcome: Ongoing (see interventions/notes)  Intervention: Optimize Skin Protection  Recent Flowsheet Documentation  Taken 04/02/2021 2020 by Lina Sar, RN  Activity Management: activity adjusted per tolerance  Head of Bed (HOB) Positioning: HOB elevated     Problem: Chest Pain  Goal: Resolution of Chest Pain Symptoms  Outcome: Ongoing (see interventions/notes)     Problem: Gas Exchange Impaired  Goal: Optimal Gas Exchange  Outcome: Ongoing (see interventions/notes)  Intervention: Optimize Oxygenation and Ventilation  Recent Flowsheet Documentation  Taken 04/02/2021 2020 by Lina Sar, RN  Head of Bed [facility]) Positioning: HOB elevated

## 2021-04-03 NOTE — Care Plan (Signed)
Patient up in bed. Alert and oriented x4. Wound care done to below the knee amputation and left outer heel. Cleansed with iodine and wet to dry dressing applied per patient instructions on how wound care done it. Tol well. Safety maintained. Call light within reach.      Problem: Adult Inpatient Plan of Care  Goal: Plan of Care Review  Outcome: Ongoing (see interventions/notes)  Goal: Patient-Specific Goal (Individualized)  Outcome: Ongoing (see interventions/notes)  Goal: Absence of Hospital-Acquired Illness or Injury  Outcome: Ongoing (see interventions/notes)  Intervention: Identify and Manage Fall Risk  Recent Flowsheet Documentation  Taken 04/03/2021 1000 by Joella Prince, RN  Safety Promotion/Fall Prevention: activity supervised  Intervention: Prevent Skin Injury  Recent Flowsheet Documentation  Taken 04/03/2021 1400 by Joella Prince, RN  Body Position: positioned/repositioned independently  Taken 04/03/2021 1200 by Joella Prince, RN  Body Position: positioned/repositioned independently  Taken 04/03/2021 1000 by Joella Prince, RN  Body Position: positioned/repositioned independently  Skin Protection: adhesive use limited  Taken 04/03/2021 0800 by Joella Prince, RN  Body Position: positioned/repositioned independently  Intervention: Prevent and Manage VTE (Venous Thromboembolism) Risk  Recent Flowsheet Documentation  Taken 04/03/2021 1000 by Joella Prince, RN  VTE Prevention/Management: anticoagulant therapy maintained  Intervention: Prevent Infection  Recent Flowsheet Documentation  Taken 04/03/2021 1000 by Joella Prince, RN  Infection Prevention: barrier precautions utilized  Goal: Optimal Comfort and Wellbeing  Outcome: Ongoing (see interventions/notes)  Intervention: Provide Person-Centered Care  Recent Flowsheet Documentation  Taken 04/03/2021 1000 by Joella Prince, RN  Trust Relationship/Rapport: care explained  Goal: Rounds/Family Conference  Outcome: Ongoing (see interventions/notes)     Problem: Fall Injury  Risk  Goal: Absence of Fall and Fall-Related Injury  Outcome: Ongoing (see interventions/notes)  Intervention: Promote Injury-Free Environment  Recent Flowsheet Documentation  Taken 04/03/2021 1000 by Joella Prince, RN  Safety Promotion/Fall Prevention: activity supervised     Problem: Skin Injury Risk Increased  Goal: Skin Health and Integrity  Outcome: Ongoing (see interventions/notes)  Intervention: Optimize Skin Protection  Recent Flowsheet Documentation  Taken 04/03/2021 1000 by Joella Prince, RN  Pressure Reduction Techniques: frequent weight shift encouraged  Pressure Reduction Devices: (M,H,VH) Use Repositioning Devices or Pillows  Skin Protection: adhesive use limited  Head of Bed (HOB) Positioning: HOB elevated     Problem: Chest Pain  Goal: Resolution of Chest Pain Symptoms  Outcome: Ongoing (see interventions/notes)     Problem: Gas Exchange Impaired  Goal: Optimal Gas Exchange  Outcome: Ongoing (see interventions/notes)  Intervention: Optimize Oxygenation and Ventilation  Recent Flowsheet Documentation  Taken 04/03/2021 1000 by Joella Prince, RN  Head of Bed [facility]) Positioning: HOB elevated

## 2021-04-03 NOTE — Progress Notes (Signed)
Interventional Cardiology  Note  [facility]  [address]  [place] [address]  [phone]      Name: Max [name].                       Date of Birth: [birth year]   MRN:  [id number]                         Date of visit: 03/30/2021     PCP: Azzie Roup, MD     Reason for Consult:     HOPI:        Max Harrisis a 51 y.o.year old malewho presents with complaints of intermittent chest pain.    His medical history significant for chronic kidney disease on dialysis 3 times a week, hypertension, hyperlipidemia, diabetes, heart failure with preserved ejection fraction. He states that his troubled by nonexertional substernal chest discomfort rate left arm. He also reports mild exertional shortness of breath. Troponins mildly elevated. EKG showed sinus rhythm with right bundle-branch block with nonspecific ST-T changes. No significant change from the baseline EKG. H&H is measured 7.2/25. Cardiology is consulted for further management.    Remains troubled by continues chest pain.  Stress test showed small area of ischemia.      Remains troubled by chest pain.     Past Medical History:   Diagnosis Date   . Arthropathy    . CKD (chronic kidney disease), stage III (CMS HCC) 08/25/2019   . Congestive heart failure (CMS HCC)    . Coronary artery disease    . Diabetes mellitus, type 2 (CMS HCC)    . Headache    . HTN (hypertension)    . Hx MRSA infection            Past Surgical History:   Procedure Laterality Date   . AMPUTATION FOOT / TOE Left    . CORONARY ARTERY ANGIOPLASTY     . HX CATARACT REMOVAL Bilateral            No current outpatient medications on file.       Allergies   Allergen Reactions   . Toradol [Ketorolac] Rash   . Ultram [Tramadol] Rash   . Benadryl [Diphenhydramine]  Other Adverse Reaction (Add comment)     irritable   . Penicillin G Benethamine        Family Medical History:     Problem Relation (Age of Onset)    Cancer Father    Coronary Artery  Disease Mother, Father            Social History     Tobacco Use   . Smoking status: Never   . Smokeless tobacco: Never   Substance Use Topics   . Alcohol use: Never        REVIEW OF SYSTEMS:   Eyes:No loss of vision.  ENT: No abnormality in hearing.  Cardiovascularno palpitations.  Respiratory: No cough, no hemoptysis.  Gastrointestinal:no diarrhea.  Central nervous system:noHeadaches, weakness.  Musculoskeletal: No leg pain, no arm pain  Urinary system:No hematuria.  Constitutional:weakness.  Psychiatry:Normal mood.      All other systems reviewed and are negative except as noted in history of presenting illness..    GENERAL EXAMINATION:    Vitals reviewed. BP 127/74   Pulse 78   Temp 36.8 C (98.2 F)   Resp 14   Ht 1.727 m (5\' 8" )   Wt 93.8  kg (206 lb 12.8 oz)   SpO2 100%   BMI 31.44 kg/m     General:Alert ,oriented x3.  Neck: supple, JVD enormal.  Heart:S1 + S2+, murmurs.  Lungs:No wheeze no rhonchi ,normal breath sounds.  Abdomen: Soft ,non-tender, no organomegaly.  Neuro : Cranial nerves intact, no sensory motor deficits.  Musculoskeletal system:No tenderness.  Skin: No rash   legabove-knee amputation  Labs:     Results for orders placed or performed during the hospital encounter of 03/30/21 (from the past 24 hour(s))   PTT (PARTIAL THROMBOPLASTIN TIME) - ONCE   Result Value Ref Range    APTT 52.3 (H) 26.0 - 39.0 seconds   VITAMIN B12   Result Value Ref Range    VITAMIN B 12  417 200 - 900 pg/mL   FOLATE   Result Value Ref Range    FOLATE 5.4 (L) 7.0 - 31.0 ng/mL   PTT (PARTIAL THROMBOPLASTIN TIME)   Result Value Ref Range    APTT 58.3 (H) 26.0 - 39.0 seconds   PT/INR daily x 7   Result Value Ref Range    PROTHROMBIN TIME 21.3 (H) 9.7 - 13.6 seconds    INR 1.77 <=5.00   CBC/DIFF - AM ONCE    Narrative    The following orders were created for panel order CBC/DIFF - AM ONCE.  Procedure                               Abnormality         Status                     ---------                                -----------         ------                     CBC WITH FIEP[[id number]]                Abnormal            Final result                 Please view results for these tests on the individual orders.   BASIC METABOLIC PANEL - AM ONCE   Result Value Ref Range    SODIUM 134 (L) 136 - 145 mmol/L    POTASSIUM 4.1 3.5 - 5.1 mmol/L    CHLORIDE 94 (L) 96 - 111 mmol/L    CO2 TOTAL 31 (H) 22 - 30 mmol/L    ANION GAP 9 4 - 13 mmol/L    CALCIUM 8.5 8.5 - 10.0 mg/dL    GLUCOSE 144 (H) 65 - 125 mg/dL    BUN 16 8 - 25 mg/dL    CREATININE 3.54 (H) 0.75 - 1.35 mg/dL    BUN/CREA RATIO 5 (L) 6 - 22    ESTIMATED GFR 20 (L) >=60 mL/min/BSA   CBC WITH DIFF   Result Value Ref Range    WBC 13.6 (H) 3.7 - 11.0 x10^3/uL    RBC 3.31 (L) 4.50 - 6.10 x10^6/uL    HGB 9.1 (L) 13.4 - 17.5 g/dL    HCT 29.1 (L) 38.9 - 52.0 %    MCV 87.9 78.0 - 100.0 fL    MCH 27.5 26.0 -  32.0 pg    MCHC 31.3 31.0 - 35.5 g/dL    RDW-CV 14.6 11.5 - 15.5 %    PLATELETS 416 (H) 150 - 400 x10^3/uL    MPV 9.3 8.7 - 12.5 fL    NEUTROPHIL % 69 %    LYMPHOCYTE % 12 %    MONOCYTE % 16 %    EOSINOPHIL % 2 %    BASOPHIL % 1 %    NEUTROPHIL # 9.47 (H) 1.50 - 7.70 x10^3/uL    LYMPHOCYTE # 1.56 1.00 - 4.80 x10^3/uL    MONOCYTE # 2.13 (H) 0.20 - 1.10 x10^3/uL    EOSINOPHIL # 0.27 <=0.50 x10^3/uL    BASOPHIL # <0.10 <=0.20 x10^3/uL    IMMATURE GRANULOCYTE % 0 0 - 1 %    IMMATURE GRANULOCYTE # <0.10 <0.10 x10^3/uL   PTT (PARTIAL THROMBOPLASTIN TIME)   Result Value Ref Range    APTT 60.7 (H) 26.0 - 39.0 seconds   POC BLOOD GLUCOSE (RESULTS)   Result Value Ref Range    GLUCOSE, POC 216 Fasting: 80-130 mg/dL; 2 HR PC: <180 mg/dL mg/dl   POC BLOOD GLUCOSE (RESULTS)   Result Value Ref Range    GLUCOSE, POC 267 Fasting: 80-130 mg/dL; 2 HR PC: <180 mg/dL mg/dl   POC BLOOD GLUCOSE (RESULTS)   Result Value Ref Range    GLUCOSE, POC 176 Fasting: 80-130 mg/dL; 2 HR PC: <180 mg/dL mg/dl       Labs:     Lab Results   Component Value Date    HA1C 5.6 02/28/2021    LDLCHOL   08/26/2019      Comment:      CALCULATION FOR LDL/VLDL-CHOLESTEROL IS INACCURATE WHEN  TRIGLYCERIDE RESULT IS GREATER THAN 400 mg/dL  <100 mg/dL, Optimal  100-129 mg/dL, Near/Above Optimal  130-159 mg/dL, Borderline High  160-189 mg/dL, High  >=190 mg/dL, Very high    TRIG 424 (H) 08/26/2019    HDLCHOL 24 (L) 08/26/2019    CHOLESTEROL 185 08/26/2019      Lab Results   Component Value Date    SODIUM 134 (L) 04/03/2021    POTASSIUM 4.1 04/03/2021    CHLORIDE 94 (L) 04/03/2021    CO2 31 (H) 04/03/2021    BUN 16 04/03/2021    CREATININE 3.54 (H) 04/03/2021    GFR 20 (L) 04/03/2021    CALCIUM 8.5 04/03/2021       I REVIEWED THE PERTINENT LABS.  Imaging:    Assessment:    Assessment/Plan   1. Chest pain at rest    2. ESRD on dialysis (CMS HCC)    3. Hyperkalemia             Plan:    Orders Placed This Encounter   . XR AP MOBILE CHEST   . CANCELED: XR AP MOBILE CHEST   . TROPONIN-I (SERIAL TROPONINS)   . CBC/DIFF   . COMPREHENSIVE METABOLIC PANEL, NON-FASTING   . LDL CHOLESTEROL, DIRECT   . PT/INR   . PTT (PARTIAL THROMBOPLASTIN TIME)   . CBC WITH DIFF   . MANUAL DIFF AND MORPHOLOGY-SYSMEX   . HEPATITIS B SURFACE ANTIGEN   . CANCELED: TROPONIN-I every 3 hours with EKG   . CBC/DIFF   . COMPREHENSIVE METABOLIC PANEL, NON-FASTING   . MAGNESIUM   . PHOSPHORUS   . TROPONIN-I   . PT/INR daily x 7   . CBC - BASELINE   . CANCELED: CBC - DAY 1   . CANCELED: CBC -  DAY 2   . CBC - DAY 3   . OCCULT BLOOD, STOOL   . CBC WITH DIFF   . CBC/DIFF - AM ONCE   . BASIC METABOLIC PANEL - AM ONCE   . CBC WITH DIFF   . PTT (PARTIAL THROMBOPLASTIN TIME)   . CBC   . BASIC METABOLIC PANEL - AM ONCE   . PTT (PARTIAL THROMBOPLASTIN TIME)   . PTT (PARTIAL THROMBOPLASTIN TIME)   . PTT (PARTIAL THROMBOPLASTIN TIME)   . PTT (PARTIAL THROMBOPLASTIN TIME) - ONCE   . CBC/DIFF - AM ONCE   . BASIC METABOLIC PANEL - AM ONCE   . VITAMIN B12   . FOLATE   . PTT (PARTIAL THROMBOPLASTIN TIME)   . CBC WITH DIFF   . PTT (PARTIAL THROMBOPLASTIN TIME)   . PTT (PARTIAL  THROMBOPLASTIN TIME)   . OXYGEN PROTOCOL   . OXYGEN PROTOCOL   . ECG 12 LEAD- TIME WITH TROPONINS   . ECG 12 LEAD   . ECG 12 LEAD   . CANCELED: CROSSMATCH RED CELLS - UNITS , 1 Units   . CROSSMATCH RED CELLS - UNITS , 2 Units   . TYPE AND CROSS RED CELLS - UNITS , 2 Units   . BPAM PACKED CELL ORDER   . INSERT & MAINTAIN PERIPHERAL IV ACCESS   . PERIPHERAL IV DRESSING CHANGE   . INSERT & MAINTAIN PERIPHERAL IV ACCESS   . PERIPHERAL IV DRESSING CHANGE   . HEMODIALYSIS   . DIALYSIS NURSE NOTIFICATION   . CANCELED: HEMODIALYSIS   . DIALYSIS NURSE NOTIFICATION   . HEMODIALYSIS   . HEMODIALYSIS   . DIALYSIS NURSE NOTIFICATION   . CANCELED: PATIENT CLASS/LEVEL OF CARE DESIGNATION - [facility]   . PT CLASS/LEVEL OF CARE CLARIFICATION   . MYOCARDIAL PERFUSION COMPLETE   . NS flush syringe   . NS flush syringe   . fentaNYL (SUBLIMAZE) 50 mcg/mL injection   . ondansetron (ZOFRAN) 2 mg/mL injection   . heparin 1,000 units/mL injection IV bolus   . heparin 1,000 units/mL injection IV bolus   . epoetin alfa (PROCRIT) 20,000 units/mL injection   . paricalcitol (ZEMPLAR) 5 mcg/mL injection   . carvedilol (COREG) tablet   . ranolazine (RANEXA) extended release tablet   . gabapentin (NEURONTIN) capsule   . atorvastatin (LIPITOR) tablet   . sevelamer carbonate (RENVELA) tablet   . sodium zirconium cyclosilicate (LOKELMA) powder   . ferrous sulfate 325 mg (elemental IRON 65 mg) tablet   . docusate sodium (COLACE) capsule   . polyethylene glycol (MIRALAX) oral packet   . ezetimibe (ZETIA) tablet   . torsemide (DEMADEX) tablet   . oxyCODONE (ROXICODONE) immediate release tablet   . pantoprazole (PROTONIX) delayed release tablet   . DULoxetine (CYMBALTA) delayed release capsule   . cyclobenzaprine (FLEXERIL) tablet   . isosorbide mononitrate (IMDUR) 24 hr extended release tablet   . magnesium hydroxide (MILK OF MAGNESIA) 400mg  per 83mL oral liquid   . ondansetron (ZOFRAN) 2 mg/mL injection   . NS flush syringe   . NS flush syringe   . Warfarin -  Pharmacist to Dose per Protocol   . correctional insulin lispro (HUMALOG) 100 units/mL injection   . insulin glargine-yfgn 100 Units/mL SubQ pen   . warfarin (COUMADIN) tablet   . warfarin (COUMADIN) tablet   . magnesium oxide (MAG-OX) 400mg  (240mg  elemental magnesium) tablet   . regadenoson (LEXISCAN) injection 0.4 mg   . heparin 1,000 units/mL injection IV bolus   .  epoetin alfa (PROCRIT) 20,000 units/mL injection   . paricalcitol (ZEMPLAR) 5 mcg/mL injection   . heparin 5,000 units/mL initial IV BOLUS   . heparin 25,000 units in 0.45% NS 250 mL infusion   . NS bolus infusion 40 mL   . lidocaine-menthol (LIDOPATCH) 3.6%-1.25% patch   . aspirin (ECOTRIN) enteric coated tablet 81 mg     Coronary disease status post PCI  Hypertension  hyperlipidemia  diabetes,  End-stage renal disease on dialysisX 3 days/ week for last 3 months.    Troponins mildly elevated.  EKG showed no significant change.  Stress test showed small area of ischemia.    Plan:  -continue aspirin, statin beta-blockers.  -continue beta-blockers,  -continue Ranexa  -continue Imdur  - DAPT  Challenge  - if H/H remains stable, will plan for LHC on Monday .   - will continue f/u .    The patient was given chance to ask questions regarding medical care.  I have addressed all the questions appropriately.  Patient  understood the discussion and  agrees with the plan.    Thank you very much for involving me in the care of this patient.  Please feel free to call me if I can be of any further assistance.    Electronically signed by     Inocencio Homes) Lissa Hoard, MD, [facility].  Interventional Cardiology ([facility])  CBCCT, CBNC, RPVI, ABVM, NBE.   04/03/2021, 12:15    This note may have been partially generated using MModal Fluency Direct system, and there may be some incorrect words, spellings, and punctuation that were not noted in checking the note before saving, though effort was made to avoid such errors.

## 2021-04-03 NOTE — Progress Notes (Signed)
PATIENT NAME: Max [name], Max [name]   HOSPITAL NUMBER:  [id number]  DATE OF SERVICE: 03/12/2021  DATE OF BIRTH:  26-Jun-1970    PROGRESS NOTE    The patient is on dialysis Monday, Wednesday, and Friday.  He is supposed to get dialysis today.  He had a cardiac stress test.  It shows that he has a partial reversibility at the anterolateral wall.  Ejection fraction is 46%.  Cardiology is on the case.  Lungs are clear.  Heart is regular rate and rhythm.  Electrolytes look good.  He looks euvolemic.  Vital signs are normal.  The patient is status post below-the-knee amputation, end-stage renal disease, coronary artery disease.  He is on a heparin drip.  Plan is to continue the heparin.  Continue dialysis Monday, Wednesday, and Friday.  He denies any chest pain or shortness of breath.  Cardiology is on the case.  He is going to get a heart catheterization on Monday.        Rondel Oh, DO                DD:  04/02/2021 12:11:01  DT:  04/03/2021 16:25:03 TM  D#:  [id number]

## 2021-04-03 NOTE — Progress Notes (Signed)
Hospitalist Progress Note     Max [name]. 51 y.o. male   Date of Birth: 01-01-1971  Date of Admit:   03/30/2021   Date of Service: 04/03/2021     Attending: Tia Alert, MD  Code Status:Full Code   PCP: Azzie Roup, MD       Assessments/plan:  Hospital Problems (* Primary Problem)    Diagnosis Date Noted   . *Chest pain at rest 03/30/2021   . End stage renal disease on dialysis (CMS [place]) 03/30/2021   . Cardiovascular disease 03/30/2021   . History of right below knee amputation (CMS [place]) 03/30/2021   . Hyperkalemia 02/28/2021   . Anemia 02/24/2021      Resolved Hospital Problems   No resolved problems to display.      1. Chest pain  a. Patient has a history of CAD status post multiple PCI  b. EKG showed some lateral T-wave changes and troponins were flat trend  c. Continue ASA, Coreg, Imdur, Ranexa   d. Nec med stress test showed a small reversible defect. Started heparin drip stopped warfarin. Planning to optimize anemia and  left heart catheterization on Monday   2. Normocytic anemia   a. H&H improving s/p PRBC transfusion on 04/02/21   b. Goal Hgb of  > 9 (in preparation of the LHC scheduled for 04/05/21)  3. History of DVT  a. Not on anticoagulation at home & no imaging studies to rove this, will continue holding the warfarin for now (currently on heparin drip for the chest pain)    4. End-stage renal disease on hemodialysis. Noncompliant with dialysis. Nephrology consulted   5. Type 2 diabetes.Continue basal bolus insulin   6. Hyperlipidemia/peripheral vascular disease. Continue aspirin statin and Zetia    Subjective   Hospital Course Summary:  Max [name]. is a 51 y.o. male with PMH of admtitted with c/o intermittent chest pain     Subjective history:  Patient seen and examined at bedside. Pt reports still having intermittent chest pain     Medical History     PMHx:    Past Medical History:   Diagnosis Date   . Arthropathy    . CKD (chronic kidney disease), stage III (CMS HCC)  08/25/2019   . Congestive heart failure (CMS HCC)    . Coronary artery disease    . Diabetes mellitus, type 2 (CMS HCC)    . Headache    . HTN (hypertension)    . Hx MRSA infection      Allergies:    Allergies   Allergen Reactions   . Toradol [Ketorolac] Rash   . Ultram [Tramadol] Rash   . Benadryl [Diphenhydramine]  Other Adverse Reaction (Add comment)     irritable   . Penicillin G Benethamine       Social History  Social History     Tobacco Use   . Smoking status: Never   . Smokeless tobacco: Never   Vaping Use   . Vaping Use: Never used   Substance Use Topics   . Alcohol use: Never   . Drug use: Never     Family History  Family Medical History:     Problem Relation (Age of Onset)    Cancer Father    Coronary Artery Disease Mother, Father       Home Meds:   DULoxetine, Warfarin, amLODIPine, aspirin, atorvastatin, bacitracin, carvediloL, cyclobenzaprine, docusate sodium, esomeprazole magnesium, ezetimibe, ferrous sulfate, gabapentin, insulin glargine-yfgn, insulin lispro, isosorbide mononitrate, lidocaine, oxyCODONE,  polyethylene glycol, ranolazine, sevelamer, sodium zirconium cyclosilicate, sucralfate, and torsemide           Objective    Objective Findings     Physical Exam:  BP 127/74   Pulse 78   Temp 36.8 C (98.2 F)   Resp 14   Ht 1.727 m (_0 )   Wt 93.8 kg (206 lb 12.8 oz)   SpO2 100%   BMI 31.44 kg/m    General: no apparent distress, vital signs reviewed  Resp: normal WOB, CTAB, no r/r/w  CV: RRR, nlS1S2, no m/r/g  GI: +BS, soft, NT, ND   Ext: dry, warm, no c/c/e   Neuro:  Alert and oriented, no focal deficits appreciated   Skin: intact, no erythema, no rash/lesions noted    Inpatient Medications  atorvastatin (LIPITOR) tablet, 80 mg, Oral, QPM  carvedilol (COREG) tablet, 25 mg, Oral, 2x/day-Food  correctional insulin lispro (HUMALOG) 100 units/mL injection, 2-10 Units, Subcutaneous, 3x/day AC  cyclobenzaprine (FLEXERIL) tablet, 5 mg, Oral, 3x/day PRN  docusate sodium (COLACE) capsule, 100 mg,  Oral, 2x/day  DULoxetine (CYMBALTA) delayed release capsule, 60 mg, Oral, Daily  ezetimibe (ZETIA) tablet, 10 mg, Oral, QPM  ferrous sulfate 325 mg (elemental IRON 65 mg) tablet, 325 mg, Oral, Daily  gabapentin (NEURONTIN) capsule, 400 mg, Oral, 3x/day  heparin 25,000 units in 0.45% NS 250 mL infusion, 12 Units/kg/hr (Adjusted), Intravenous, Continuous  insulin glargine-yfgn 100 Units/mL SubQ pen, 8 Units, Subcutaneous, 2x/day  isosorbide mononitrate (IMDUR) 24 hr extended release tablet, 60 mg, Oral, QAM  lidocaine-menthol (LIDOPATCH) 3.6%-1.25% patch, 1 Patch, Transdermal, Daily  magnesium hydroxide (MILK OF MAGNESIA) 461m per 560moral liquid, 15 mL, Oral, Daily PRN  magnesium oxide (MAG-OX) 40071m240m50memental magnesium) tablet, 400 mg, Oral, 2x/day  NS flush syringe, 3 mL, Intracatheter, Q8HRS  NS flush syringe, 3 mL, Intracatheter, Q1H PRN  NS flush syringe, 3 mL, Intracatheter, Q8HRS  NS flush syringe, 3 mL, Intracatheter, Q1H PRN  ondansetron (ZOFRAN) 2 mg/mL injection, 4 mg, Intravenous, Q6H PRN  oxyCODONE (ROXICODONE) immediate release tablet, 5 mg, Oral, Q6H PRN  pantoprazole (PROTONIX) delayed release tablet, 40 mg, Oral, Daily  polyethylene glycol (MIRALAX) oral packet, 17 g, Oral, Daily  ranolazine (RANEXA) extended release tablet, 500 mg, Oral, 2x/day  sevelamer carbonate (RENVELA) tablet, 800 mg, Oral, 3x/day-Meals  sodium zirconium cyclosilicate (LOKELMA) powder, 10 g, Oral, Daily  torsemide (DEMADEX) tablet, 20 mg, Oral, 2x/day  [Held by provider] Warfarin - Pharmacist to Dose per Protocol, , Does not apply, Daily PRN      Summary of Lab Work and Diagnostic Studies:   I/O last 24 hours:      Intake/Output Summary (Last 24 hours) at 04/03/2021 0931  Last data filed at 04/03/2021 0500  Gross per 24 hour   Intake 1648.46 ml   Output 3100 ml   Net -1451.54 ml     Labs:  CBC   Recent Labs     04/01/21  2108 04/02/21  0300 04/03/21  0134   WBC 11.8* 13.7* 13.6*   HGB 8.0* 7.8* 9.1*   HCT 25.8* 25.6*  29.1*   PLTCNT 440* 440* 416*      Chemistries   Recent Labs     04/01/21  0513 04/02/21  0300 04/03/21  0134   SODIUM 136 137 134*   POTASSIUM 4.4 4.6 4.1   CHLORIDE 96 92* 94*   CO2 28 28 31*   BUN 21 27* 16   CREATININE 3.72* 4.93* 3.54*   CALCIUM  8.6 8.5 8.5       Liver Enzymes   No results for input(s): TOTALPROTEIN, ALBUMIN, AST, ALT, ALKPHOS, LIPASE in the last 72 hours.   Inflammatory Markers   No results for input(s): CRP, ESR, LACTICACID, LDH, FERRITIN in the last 72 hours.    Invalid input(s): PROCALCITONIN, D-DIMER   Cardiac and Coags   Recent Labs     04/01/21  0513 04/02/21  0300 04/03/21  0050   INR 1.87 2.14 1.77      Lipid Panel   No results for input(s): HDL in the last 72 hours.    Invalid input(s): LDL, CHOL     Microbiology:  No results found for any visits on 03/30/21 (from the past 96 hour(s)).  Imaging:            Nutrition: DIET RENAL DIABETIC HIGH PROTEIN Carb Amount: 1500 Cal = 45 Carbs per meal- 3 Carb choices; Restrict fluids to: 1400 ML  DVT PPx: heparin   Code Status: Full Code    Disposition:  TBD      Tia Alert, MD  This note may have been partially generated using MModal Fluency Direct system and there may be some incorrect words, spellings, and punctuation that were not noted in checking the note before saving.

## 2021-04-04 DIAGNOSIS — E876 Hypokalemia: Secondary | ICD-10-CM

## 2021-04-04 DIAGNOSIS — D649 Anemia, unspecified: Secondary | ICD-10-CM

## 2021-04-04 LAB — CBC WITH DIFF
BASOPHIL #: 0.1 10*3/uL (ref <=0.20)
BASOPHIL %: 1 %
EOSINOPHIL #: 0.4 10*3/uL (ref <=0.50)
EOSINOPHIL %: 4 %
HCT: 31.1 % — ABNORMAL LOW (ref 38.9–52.0)
HGB: 9.5 g/dL — ABNORMAL LOW (ref 13.4–17.5)
IMMATURE GRANULOCYTE #: <0.1 10*3/uL (ref <0.10)
IMMATURE GRANULOCYTE %: 0 % (ref 0–1)
LYMPHOCYTE #: 2.4 10*3/uL (ref 1.00–4.80)
LYMPHOCYTE %: 21 %
MCH: 27.5 pg (ref 26.0–32.0)
MCHC: 30.5 g/dL — ABNORMAL LOW (ref 31.0–35.5)
MCV: 90.1 fL (ref 78.0–100.0)
MONOCYTE #: 2.21 10*3/uL — ABNORMAL HIGH (ref 0.20–1.10)
MONOCYTE %: 19 %
MPV: 9.6 fL (ref 8.7–12.5)
NEUTROPHIL #: 6.35 10*3/uL (ref 1.50–7.70)
NEUTROPHIL %: 55 %
PLATELETS: 403 10*3/uL — ABNORMAL HIGH (ref 150–400)
RBC: 3.45 10*6/uL — ABNORMAL LOW (ref 4.50–6.10)
RDW-CV: 15.1 % (ref 11.5–15.5)
WBC: 11.5 10*3/uL — ABNORMAL HIGH (ref 3.7–11.0)

## 2021-04-04 LAB — ECG 12 LEAD
Atrial Rate: 77 {beats}/min
Atrial Rate: 75 {beats}/min
Calculated P Axis: 27 degrees
Calculated P Axis: 47 degrees
Calculated R Axis: 98 degrees
Calculated T Axis: -19 degrees
QT Interval: 440 ms
QT Interval: 446 ms
Ventricular rate: 75 {beats}/min

## 2021-04-04 LAB — POC BLOOD GLUCOSE (RESULTS)
GLUCOSE, POC: 214 mg/dl (ref 80–130)
GLUCOSE, POC: 333 mg/dl (ref 80–130)
GLUCOSE, POC: 247 mg/dl (ref 80–130)
GLUCOSE, POC: 173 mg/dl (ref 80–130)

## 2021-04-04 LAB — BASIC METABOLIC PANEL
ANION GAP: 14 mmol/L — ABNORMAL HIGH (ref 4–13)
BUN/CREA RATIO: 5 — ABNORMAL LOW (ref 6–22)
BUN: 26 mg/dL — ABNORMAL HIGH (ref 8–25)
CALCIUM: 8.9 mg/dL (ref 8.5–10.0)
CHLORIDE: 92 mmol/L — ABNORMAL LOW (ref 96–111)
CO2 TOTAL: 27 mmol/L (ref 22–30)
CREATININE: 4.84 mg/dL — ABNORMAL HIGH (ref 0.75–1.35)
ESTIMATED GFR: 14 mL/min/BSA — ABNORMAL LOW (ref >=60)
GLUCOSE: 154 mg/dL — ABNORMAL HIGH (ref 65–125)
POTASSIUM: 4.3 mmol/L (ref 3.5–5.1)
SODIUM: 133 mmol/L — ABNORMAL LOW (ref 136–145)

## 2021-04-04 LAB — PT/INR
INR: 1.38 (ref <=5.00)
PROTHROMBIN TIME: 16.5 seconds — ABNORMAL HIGH (ref 9.7–13.6)

## 2021-04-04 LAB — PHOSPHORUS: PHOSPHORUS: 5.1 mg/dL — ABNORMAL HIGH (ref 2.4–4.7)

## 2021-04-04 LAB — PTT (PARTIAL THROMBOPLASTIN TIME)
APTT: 60.7 seconds — ABNORMAL HIGH (ref 26.0–39.0)
APTT: 59.1 seconds — ABNORMAL HIGH (ref 26.0–39.0)
APTT: 61.3 seconds — ABNORMAL HIGH (ref 26.0–39.0)
APTT: 52.8 seconds — ABNORMAL HIGH (ref 26.0–39.0)

## 2021-04-04 LAB — MAGNESIUM: MAGNESIUM: 1.6 mg/dL — ABNORMAL LOW (ref 1.8–2.6)

## 2021-04-04 MED ORDER — SODIUM CHLORIDE 0.9 % (FLUSH) INJECTION SYRINGE
3.0000 mL | INJECTION | Freq: Three times a day (TID) | INTRAMUSCULAR | Status: DC
Start: 2021-04-04 — End: 2021-04-06
  Administered 2021-04-04 – 2021-04-06 (×6): 0 mL

## 2021-04-04 MED ORDER — OXYCODONE 5 MG TABLET
7.5000 mg | ORAL_TABLET | Freq: Four times a day (QID) | ORAL | Status: DC | PRN
Start: 2021-04-04 — End: 2021-04-06
  Administered 2021-04-04 – 2021-04-06 (×6): 7.5 mg via ORAL
  Filled 2021-04-04 (×8): qty 2

## 2021-04-04 MED ORDER — HEPARIN 1,000 UNITS/ML BOLUS FOR DOSE ADJUSTMENT
1.0000 mL | Freq: Once | INTRAMUSCULAR | Status: AC | PRN
Start: 2021-04-04 — End: 2021-04-05
  Administered 2021-04-05: 3200 [IU]

## 2021-04-04 MED ORDER — EPOETIN ALFA 20,000 UNIT/ML INJECTION
150.0000 [IU]/kg | INTRAMUSCULAR | Status: AC
Start: 2021-04-04 — End: 2021-04-05
  Administered 2021-04-05: 12000 [IU] via INTRAVENOUS
  Filled 2021-04-04: qty 1

## 2021-04-04 MED ORDER — PARICALCITOL 5 MCG/ML INTRAVENOUS SOLUTION
2.0000 ug | INTRAVENOUS | Status: AC
Start: 2021-04-04 — End: 2021-04-05
  Administered 2021-04-05: 2 ug via INTRAVENOUS

## 2021-04-04 MED ORDER — MAGNESIUM SULFATE 2 GRAM/50 ML (4 %) IN WATER INTRAVENOUS PIGGYBACK
2.0000 g | INJECTION | Freq: Once | INTRAVENOUS | Status: AC
Start: 2021-04-04 — End: 2021-04-04
  Administered 2021-04-04: 2 g via INTRAVENOUS
  Administered 2021-04-04: 0 g via INTRAVENOUS
  Filled 2021-04-04: qty 50

## 2021-04-04 MED ORDER — SODIUM CHLORIDE 0.9 % (FLUSH) INJECTION SYRINGE
3.0000 mL | INJECTION | INTRAMUSCULAR | Status: DC | PRN
Start: 2021-04-04 — End: 2021-04-06

## 2021-04-04 NOTE — Care Plan (Signed)
Alert and oriented X4. Pt has been using urinal. Pt has has 20g RAC with Heparin running at 12 units. 20 g LAC that had Mag given. Pt has been resting in bed. C/o back pain but no chest pain or discomfort noted. Pt has a line in R chest for dialysis, pt is scheduled for dialysis tomorrow. Telemetry intact. Pt is NPO after midnight for heart cath, pt took 1 of his baths for tomorrow and advised to take another and he keeps repeating that he will. Call light withn reach, safety maintained.Kerri Perches, LPN  1/70/0174, 94:49    Problem: Adult Inpatient Plan of Care  Goal: Plan of Care Review  Outcome: Ongoing (see interventions/notes)  Goal: Patient-Specific Goal (Individualized)  Outcome: Ongoing (see interventions/notes)  Goal: Absence of Hospital-Acquired Illness or Injury  Outcome: Ongoing (see interventions/notes)  Intervention: Identify and Manage Fall Risk  Recent Flowsheet Documentation  Taken 04/04/2021 1600 by Kerri Perches, LPN  Safety Promotion/Fall Prevention:   activity supervised   nonskid shoes/slippers when out of bed   safety round/check completed  Taken 04/04/2021 1400 by Kerri Perches, LPN  Safety Promotion/Fall Prevention: activity supervised  Taken 04/04/2021 0800 by Kerri Perches, LPN  Safety Promotion/Fall Prevention:   nonskid shoes/slippers when out of bed   safety round/check completed   activity supervised  Intervention: Prevent Skin Injury  Recent Flowsheet Documentation  Taken 04/04/2021 1800 by Kerri Perches, LPN  Body Position: positioned/repositioned independently  Taken 04/04/2021 1600 by Kerri Perches, LPN  Body Position: positioned/repositioned independently  Taken 04/04/2021 1400 by Kerri Perches, LPN  Body Position: positioned/repositioned independently  Taken 04/04/2021 1200 by Kerri Perches, LPN  Body Position: positioned/repositioned independently  Taken 04/04/2021 0952 by Kerri Perches, LPN  Body Position: positioned/repositioned independently  Taken 04/04/2021 0800 by  Kerri Perches, LPN  Body Position: positioned/repositioned independently  Skin Protection: adhesive use limited  Intervention: Prevent and Manage VTE (Venous Thromboembolism) Risk  Recent Flowsheet Documentation  Taken 04/04/2021 0800 by Kerri Perches, LPN  VTE Prevention/Management: anticoagulant therapy maintained

## 2021-04-04 NOTE — Nurses Notes (Signed)
Removed old drsg to right knee. Pt had gauze covered in betadine. Wound cleansed with ns and covered with non adhesice pad covered in betadine secured with hepafix tape. Old drsg removed off left foot. Wound cleansed with normal saline. Applied telfa, betadine and secured with hepafix. Pt tolerated well. No wound orders, pt said what he does at home. Photos taken and in chart.Kerri Perches, LPN  2/59/5638, 75:64

## 2021-04-04 NOTE — Progress Notes (Signed)
Interventional Cardiology  Note  [facility]  [address]  [place] [address]  [phone]      Name: Max [name].                       Date of Birth: [birth year]   MRN:  [id number]                         Date of visit: 03/30/2021     PCP: Azzie Roup, MD     Reason for Consult:     HOPI:        Max Harrisis a 51 y.o.year old malewho presents with complaints of intermittent chest pain.    His medical history significant for chronic kidney disease on dialysis 3 times a week, hypertension, hyperlipidemia, diabetes, heart failure with preserved ejection fraction. He states that his troubled by nonexertional substernal chest discomfort rate left arm. He also reports mild exertional shortness of breath. Troponins mildly elevated. EKG showed sinus rhythm with right bundle-branch block with nonspecific ST-T changes. No significant change from the baseline EKG. H&H is measured 7.2/25. Cardiology is consulted for further management.    Remains troubled by continues chest pain.  Stress test showed small area of ischemia.      Remains troubled by chest pain    Past Medical History:   Diagnosis Date   . Arthropathy    . CKD (chronic kidney disease), stage III (CMS HCC) 08/25/2019   . Congestive heart failure (CMS HCC)    . Coronary artery disease    . Diabetes mellitus, type 2 (CMS HCC)    . Headache    . HTN (hypertension)    . Hx MRSA infection            Past Surgical History:   Procedure Laterality Date   . AMPUTATION FOOT / TOE Left    . CORONARY ARTERY ANGIOPLASTY     . HX CATARACT REMOVAL Bilateral            No current outpatient medications on file.       Allergies   Allergen Reactions   . Toradol [Ketorolac] Rash   . Ultram [Tramadol] Rash   . Benadryl [Diphenhydramine]  Other Adverse Reaction (Add comment)     irritable   . Penicillin G Benethamine        Family Medical History:     Problem Relation (Age of Onset)    Cancer Father    Coronary Artery  Disease Mother, Father            Social History     Tobacco Use   . Smoking status: Never   . Smokeless tobacco: Never   Substance Use Topics   . Alcohol use: Never        REVIEW OF SYSTEMS:   Eyes:No loss of vision.  ENT: No abnormality in hearing.  Cardiovascularno palpitations.  Respiratory: No cough, no hemoptysis.  Gastrointestinal:no diarrhea.  Central nervous system:noHeadaches, weakness.  Musculoskeletal: No leg pain, no arm pain  Urinary system:No hematuria.  Constitutional:weakness.  Psychiatry:Normal mood.    All other systems reviewed and are negative except as noted in history of presenting illness..    GENERAL EXAMINATION:    Vitals reviewed. BP 128/78   Pulse 76   Temp 36.5 C (97.7 F)   Resp 16   Ht 1.727 m (5\' 8" )   Wt 93.8 kg (206 lb  12.8 oz)   SpO2 100%   BMI 31.44 kg/m         General:Alert ,oriented x3.  Neck: supple, JVD enormal.  Heart:S1 + S2+, murmurs.  Lungs:No wheeze no rhonchi ,normal breath sounds.  Abdomen: Soft ,non-tender, no organomegaly.  Neuro : Cranial nerves intact, no sensory motor deficits.  Musculoskeletal system:No tenderness.  Skin: No rash   legabove-knee amputation    Labs:     Results for orders placed or performed during the hospital encounter of 03/30/21 (from the past 24 hour(s))   PTT (PARTIAL THROMBOPLASTIN TIME)   Result Value Ref Range    APTT 46.7 (H) 26.0 - 39.0 seconds   PTT (PARTIAL THROMBOPLASTIN TIME)   Result Value Ref Range    APTT 45.5 (H) 26.0 - 39.0 seconds   PT/INR daily x 7   Result Value Ref Range    PROTHROMBIN TIME 16.5 (H) 9.7 - 13.6 seconds    INR 1.38 <=6.27   BASIC METABOLIC PANEL   Result Value Ref Range    SODIUM 133 (L) 136 - 145 mmol/L    POTASSIUM 4.3 3.5 - 5.1 mmol/L    CHLORIDE 92 (L) 96 - 111 mmol/L    CO2 TOTAL 27 22 - 30 mmol/L    ANION GAP 14 (H) 4 - 13 mmol/L    CALCIUM 8.9 8.5 - 10.0 mg/dL    GLUCOSE 154 (H) 65 - 125 mg/dL    BUN 26 (H) 8 - 25 mg/dL    CREATININE 4.84 (H) 0.75 - 1.35 mg/dL     BUN/CREA RATIO 5 (L) 6 - 22    ESTIMATED GFR 14 (L) >=60 mL/min/BSA   CBC/DIFF    Narrative    The following orders were created for panel order CBC/DIFF.  Procedure                               Abnormality         Status                     ---------                               -----------         ------                     CBC WITH OJJK[[id number]]                Abnormal            Final result                 Please view results for these tests on the individual orders.   MAGNESIUM   Result Value Ref Range    MAGNESIUM 1.6 (L) 1.8 - 2.6 mg/dL   PHOSPHORUS   Result Value Ref Range    PHOSPHORUS 5.1 (H) 2.4 - 4.7 mg/dL   PTT (PARTIAL THROMBOPLASTIN TIME) - ONCE   Result Value Ref Range    APTT 60.7 (H) 26.0 - 39.0 seconds   CBC WITH DIFF   Result Value Ref Range    WBC 11.5 (H) 3.7 - 11.0 x10^3/uL    RBC 3.45 (L) 4.50 - 6.10 x10^6/uL    HGB 9.5 (L) 13.4 - 17.5 g/dL    HCT 31.1 (L) 38.9 - 52.0 %  MCV 90.1 78.0 - 100.0 fL    MCH 27.5 26.0 - 32.0 pg    MCHC 30.5 (L) 31.0 - 35.5 g/dL    RDW-CV 15.1 11.5 - 15.5 %    PLATELETS 403 (H) 150 - 400 x10^3/uL    MPV 9.6 8.7 - 12.5 fL    NEUTROPHIL % 55 %    LYMPHOCYTE % 21 %    MONOCYTE % 19 %    EOSINOPHIL % 4 %    BASOPHIL % 1 %    NEUTROPHIL # 6.35 1.50 - 7.70 x10^3/uL    LYMPHOCYTE # 2.40 1.00 - 4.80 x10^3/uL    MONOCYTE # 2.21 (H) 0.20 - 1.10 x10^3/uL    EOSINOPHIL # 0.40 <=0.50 x10^3/uL    BASOPHIL # 0.10 <=0.20 x10^3/uL    IMMATURE GRANULOCYTE % 0 0 - 1 %    IMMATURE GRANULOCYTE # <0.10 <0.10 x10^3/uL   PTT (PARTIAL THROMBOPLASTIN TIME) - ONCE   Result Value Ref Range    APTT 59.1 (H) 26.0 - 39.0 seconds   POC BLOOD GLUCOSE (RESULTS)   Result Value Ref Range    GLUCOSE, POC 196 Fasting: 80-130 mg/dL; 2 HR PC: <180 mg/dL mg/dl   POC BLOOD GLUCOSE (RESULTS)   Result Value Ref Range    GLUCOSE, POC 133 Fasting: 80-130 mg/dL; 2 HR PC: <180 mg/dL mg/dl   POC BLOOD GLUCOSE (RESULTS)   Result Value Ref Range    GLUCOSE, POC 214 Fasting: 80-130 mg/dL; 2 HR PC: <180 mg/dL  mg/dl   POC BLOOD GLUCOSE (RESULTS)   Result Value Ref Range    GLUCOSE, POC 333 Fasting: 80-130 mg/dL; 2 HR PC: <180 mg/dL mg/dl       Labs:     Lab Results   Component Value Date    HA1C 5.6 02/28/2021    LDLCHOL  08/26/2019      Comment:      CALCULATION FOR LDL/VLDL-CHOLESTEROL IS INACCURATE WHEN  TRIGLYCERIDE RESULT IS GREATER THAN 400 mg/dL  <100 mg/dL, Optimal  100-129 mg/dL, Near/Above Optimal  130-159 mg/dL, Borderline High  160-189 mg/dL, High  >=190 mg/dL, Very high    TRIG 424 (H) 08/26/2019    HDLCHOL 24 (L) 08/26/2019    CHOLESTEROL 185 08/26/2019      Lab Results   Component Value Date    SODIUM 133 (L) 04/04/2021    POTASSIUM 4.3 04/04/2021    CHLORIDE 92 (L) 04/04/2021    CO2 27 04/04/2021    BUN 26 (H) 04/04/2021    CREATININE 4.84 (H) 04/04/2021    GFR 14 (L) 04/04/2021    CALCIUM 8.9 04/04/2021       I REVIEWED THE PERTINENT LABS.  Imaging:    Assessment:    Assessment/Plan   1. Chest pain at rest    2. ESRD on dialysis (CMS HCC)    3. Hyperkalemia             Plan:    Orders Placed This Encounter   . XR AP MOBILE CHEST   . CANCELED: XR AP MOBILE CHEST   . TROPONIN-I (SERIAL TROPONINS)   . CBC/DIFF   . COMPREHENSIVE METABOLIC PANEL, NON-FASTING   . LDL CHOLESTEROL, DIRECT   . PT/INR   . PTT (PARTIAL THROMBOPLASTIN TIME)   . CBC WITH DIFF   . MANUAL DIFF AND MORPHOLOGY-SYSMEX   . HEPATITIS B SURFACE ANTIGEN   . CANCELED: TROPONIN-I every 3 hours with EKG   . CBC/DIFF   . COMPREHENSIVE  METABOLIC PANEL, NON-FASTING   . MAGNESIUM   . PHOSPHORUS   . TROPONIN-I   . PT/INR daily x 7   . CBC - BASELINE   . CANCELED: CBC - DAY 1   . CANCELED: CBC - DAY 2   . CBC - DAY 3   . OCCULT BLOOD, STOOL   . CBC WITH DIFF   . CBC/DIFF - AM ONCE   . BASIC METABOLIC PANEL - AM ONCE   . CBC WITH DIFF   . PTT (PARTIAL THROMBOPLASTIN TIME)   . CBC   . BASIC METABOLIC PANEL - AM ONCE   . PTT (PARTIAL THROMBOPLASTIN TIME)   . PTT (PARTIAL THROMBOPLASTIN TIME)   . PTT (PARTIAL THROMBOPLASTIN TIME)   . PTT (PARTIAL  THROMBOPLASTIN TIME) - ONCE   . CBC/DIFF - AM ONCE   . BASIC METABOLIC PANEL - AM ONCE   . VITAMIN B12   . FOLATE   . PTT (PARTIAL THROMBOPLASTIN TIME)   . CBC WITH DIFF   . PTT (PARTIAL THROMBOPLASTIN TIME)   . PTT (PARTIAL THROMBOPLASTIN TIME)   . PTT (PARTIAL THROMBOPLASTIN TIME)   . BASIC METABOLIC PANEL   . CBC/DIFF   . MAGNESIUM   . PHOSPHORUS   . CANCELED: PTT (PARTIAL THROMBOPLASTIN TIME) - ONCE   . PTT (PARTIAL THROMBOPLASTIN TIME) - ONCE   . CBC WITH DIFF   . PTT (PARTIAL THROMBOPLASTIN TIME) - ONCE   . PTT (PARTIAL THROMBOPLASTIN TIME)   . OXYGEN PROTOCOL   . OXYGEN PROTOCOL   . ECG 12 LEAD- TIME WITH TROPONINS   . ECG 12 LEAD   . ECG 12 LEAD   . CANCELED: CROSSMATCH RED CELLS - UNITS , 1 Units   . CROSSMATCH RED CELLS - UNITS , 2 Units   . TYPE AND CROSS RED CELLS - UNITS , 2 Units   . BPAM PACKED CELL ORDER   . INSERT & MAINTAIN PERIPHERAL IV ACCESS   . PERIPHERAL IV DRESSING CHANGE   . INSERT & MAINTAIN PERIPHERAL IV ACCESS   . PERIPHERAL IV DRESSING CHANGE   . HEMODIALYSIS   . DIALYSIS NURSE NOTIFICATION   . CANCELED: HEMODIALYSIS   . DIALYSIS NURSE NOTIFICATION   . HEMODIALYSIS   . HEMODIALYSIS   . DIALYSIS NURSE NOTIFICATION   . HEMODIALYSIS   . DIALYSIS NURSE NOTIFICATION   . CANCELED: PATIENT CLASS/LEVEL OF CARE DESIGNATION - [facility]   . PT CLASS/LEVEL OF CARE CLARIFICATION   . MYOCARDIAL PERFUSION COMPLETE   . NS flush syringe   . NS flush syringe   . fentaNYL (SUBLIMAZE) 50 mcg/mL injection   . ondansetron (ZOFRAN) 2 mg/mL injection   . heparin 1,000 units/mL injection IV bolus   . heparin 1,000 units/mL injection IV bolus   . epoetin alfa (PROCRIT) 20,000 units/mL injection   . paricalcitol (ZEMPLAR) 5 mcg/mL injection   . carvedilol (COREG) tablet   . ranolazine (RANEXA) extended release tablet   . gabapentin (NEURONTIN) capsule   . atorvastatin (LIPITOR) tablet   . sevelamer carbonate (RENVELA) tablet   . sodium zirconium cyclosilicate (LOKELMA) powder   . ferrous sulfate 325 mg (elemental IRON  65 mg) tablet   . docusate sodium (COLACE) capsule   . polyethylene glycol (MIRALAX) oral packet   . ezetimibe (ZETIA) tablet   . torsemide (DEMADEX) tablet   . pantoprazole (PROTONIX) delayed release tablet   . DULoxetine (CYMBALTA) delayed release capsule   . cyclobenzaprine (FLEXERIL) tablet   . isosorbide  mononitrate (IMDUR) 24 hr extended release tablet   . magnesium hydroxide (MILK OF MAGNESIA) 400mg  per 80mL oral liquid   . ondansetron (ZOFRAN) 2 mg/mL injection   . NS flush syringe   . NS flush syringe   . Warfarin - Pharmacist to Dose per Protocol   . correctional insulin lispro (HUMALOG) 100 units/mL injection   . insulin glargine-yfgn 100 Units/mL SubQ pen   . warfarin (COUMADIN) tablet   . warfarin (COUMADIN) tablet   . magnesium oxide (MAG-OX) 400mg  (240mg  elemental magnesium) tablet   . regadenoson (LEXISCAN) injection 0.4 mg   . heparin 1,000 units/mL injection IV bolus   . epoetin alfa (PROCRIT) 20,000 units/mL injection   . paricalcitol (ZEMPLAR) 5 mcg/mL injection   . heparin 5,000 units/mL initial IV BOLUS   . heparin 25,000 units in 0.45% NS 250 mL infusion   . NS bolus infusion 40 mL   . lidocaine-menthol (LIDOPATCH) 3.6%-1.25% patch   . aspirin (ECOTRIN) enteric coated tablet 81 mg   . magnesium sulfate 2 G in SW 50 mL premix IVPB   . heparin 1,000 units/mL injection IV bolus   . epoetin alfa (PROCRIT) 20,000 units/mL injection   . paricalcitol (ZEMPLAR) 5 mcg/mL injection   . oxyCODONE (ROXICODONE) immediate release tablet     Coronary disease status post PCI  Hypertension  hyperlipidemia  diabetes,  End-stage renal disease on dialysisX 3 days/ week for last 3 months.    Troponins mildly elevated.  EKG showed no significant change.  Stress test showed small area of ischemia.    Plan:  -continue aspirin, statin beta-blockers.  -continue beta-blockers,  -continue Ranexa  -continue Imdur  - DAPT Challenge  - if H/H remains stable, will plan for LHC on Monday .   - NPO after MN.  - will  continue f/u .    The patient was given chance to ask questions regarding medical care.  I have addressed all the questions appropriately.  Patient  understood the discussion and  agrees with the plan.    Thank you very much for involving me in the care of this patient.  Please feel free to call me if I can be of any further assistance.    Electronically signed by     Inocencio Homes) Lissa Hoard, MD, [facility].  Interventional Cardiology ([facility])  CBCCT, CBNC, RPVI, ABVM, NBE.   04/04/2021, 12:16    This note may have been partially generated using MModal Fluency Direct system, and there may be some incorrect words, spellings, and punctuation that were not noted in checking the note before saving, though effort was made to avoid such errors.

## 2021-04-04 NOTE — Care Plan (Signed)
Problem: Adult Inpatient Plan of Care  Goal: Plan of Care Review  Outcome: Ongoing (see interventions/notes)  Flowsheets (Taken 04/04/2021 0348)  Progress: no change  Plan of Care Reviewed With: patient     Problem: Skin Injury Risk Increased  Goal: Skin Health and Integrity  Outcome: Ongoing (see interventions/notes)  Intervention: Optimize Skin Protection  Recent Flowsheet Documentation  Taken 04/03/2021 2000 by Elsie Saas, RN  Pressure Reduction Techniques: frequent weight shift encouraged  Pressure Reduction Devices: (M,H,VH) Use Repositioning Devices or Pillows  Skin Protection: adhesive use limited  Activity Management: activity adjusted per tolerance  Head of Bed (HOB) Positioning: HOB elevated     Problem: Chest Pain  Goal: Resolution of Chest Pain Symptoms  Outcome: Ongoing (see interventions/notes)   Pt up and down for shift.  Vitals stable,  complains of pain see mar.  Pt requested fentanyl, md notified.  Call light within reach bed alarm on will cont to monitor

## 2021-04-04 NOTE — Progress Notes (Addendum)
Hospitalist Progress Note     Max [name]. 51 y.o. male   Date of Birth: 13-Oct-1970  Date of Admit:   03/30/2021   Date of Service: 04/04/2021     Attending: Tia Alert, MD  Code Status:Full Code   PCP: Azzie Roup, MD       Assessments/plan:  Hospital Problems (* Primary Problem)    Diagnosis Date Noted   . *Chest pain at rest 03/30/2021   . Hypomagnesemia 04/04/2021   . End stage renal disease on dialysis (CMS [place]) 03/30/2021   . Cardiovascular disease 03/30/2021   . History of right below knee amputation (CMS [place]) 03/30/2021   . Hyperkalemia 02/28/2021   . Anemia 02/24/2021      Resolved Hospital Problems   No resolved problems to display.      1. Chest pain  a. Patient has a history of CAD status post multiple PCI  b. EKG showed some lateral T-wave changes and troponins were flat trend  c. Continue ASA, Coreg, Imdur, Ranexa   d. Nec med stress test showed a small reversible defect. Started heparin drip stopped warfarin. Planning to optimize anemia for left heart catheterization (schedueld for 04/05/21)    2. Hypomagnesemia & hypokalemia: replace as needed   3. Normocytic anemia   a. H&H improving s/p PRBC transfusion on 04/02/21   b. Goal Hgb of  > 9 (in preparation of the LHC scheduled for 04/05/21)  4. History of DVT  a. Not on anticoagulation at home & no imaging studies to prove this, will continue holding the warfarin for now (currently on heparin drip for the chest pain)    5. End-stage renal disease on hemodialysis. Noncompliant with dialysis. Nephrology consulted   6. Type 2 diabetes.Continue basal bolus insulin   7. Hyperlipidemia/peripheral vascular disease. Continue aspirin statin and Zetia    Subjective   Hospital Course Summary:  Max Savage. is a 51 y.o. male with PMH of admtitted with c/o intermittent chest pain     Subjective history:  Patient reports still having intermittent chest pain      Medical History     PMHx:    Past Medical History:   Diagnosis Date   .  Arthropathy    . CKD (chronic kidney disease), stage III (CMS HCC) 08/25/2019   . Congestive heart failure (CMS HCC)    . Coronary artery disease    . Diabetes mellitus, type 2 (CMS HCC)    . Headache    . HTN (hypertension)    . Hx MRSA infection      Allergies:    Allergies   Allergen Reactions   . Toradol [Ketorolac] Rash   . Ultram [Tramadol] Rash   . Benadryl [Diphenhydramine]  Other Adverse Reaction (Add comment)     irritable   . Penicillin G Benethamine       Social History  Social History     Tobacco Use   . Smoking status: Never   . Smokeless tobacco: Never   Vaping Use   . Vaping Use: Never used   Substance Use Topics   . Alcohol use: Never   . Drug use: Never     Family History  Family Medical History:     Problem Relation (Age of Onset)    Cancer Father    Coronary Artery Disease Mother, Father       Home Meds:   DULoxetine, Warfarin, amLODIPine, aspirin, atorvastatin, bacitracin, carvediloL, cyclobenzaprine, docusate sodium, esomeprazole magnesium, ezetimibe, ferrous  sulfate, gabapentin, insulin glargine-yfgn, insulin lispro, isosorbide mononitrate, lidocaine, oxyCODONE, polyethylene glycol, ranolazine, sevelamer, sodium zirconium cyclosilicate, sucralfate, and torsemide           Objective    Objective Findings     Physical Exam:  BP 128/78   Pulse 76   Temp 36.5 C (97.7 F)   Resp 16   Ht 1.727 m ('5\' 8"'$ )   Wt 93.8 kg (206 lb 12.8 oz)   SpO2 100%   BMI 31.44 kg/m    General: no apparent distress, vital signs reviewed  Resp: normal WOB, CTAB, no r/r/w  CV: RRR, nlS1S2, no m/r/g  GI: +BS, soft, NT, ND   Ext: dry, warm, no c/c/e   Neuro:  Alert and oriented, no focal deficits appreciated   Skin: intact, no erythema, no rash/lesions noted    Inpatient Medications  aspirin (ECOTRIN) enteric coated tablet 81 mg, 81 mg, Oral, Daily  atorvastatin (LIPITOR) tablet, 80 mg, Oral, QPM  carvedilol (COREG) tablet, 25 mg, Oral, 2x/day-Food  correctional insulin lispro (HUMALOG) 100 units/mL injection, 2-10  Units, Subcutaneous, 3x/day AC  cyclobenzaprine (FLEXERIL) tablet, 5 mg, Oral, 3x/day PRN  docusate sodium (COLACE) capsule, 100 mg, Oral, 2x/day  DULoxetine (CYMBALTA) delayed release capsule, 60 mg, Oral, Daily  epoetin alfa (PROCRIT) 20,000 units/mL injection, 150 Units/kg (Adjusted), Intravenous, Give in Dialysis  ezetimibe (ZETIA) tablet, 10 mg, Oral, QPM  ferrous sulfate 325 mg (elemental IRON 65 mg) tablet, 325 mg, Oral, Daily  gabapentin (NEURONTIN) capsule, 400 mg, Oral, 3x/day  heparin 1,000 units/mL injection IV bolus, 1-5 mL, Intercatheter, Dialysis x1 PRN  heparin 25,000 units in 0.45% NS 250 mL infusion, 12 Units/kg/hr (Adjusted), Intravenous, Continuous  insulin glargine-yfgn 100 Units/mL SubQ pen, 8 Units, Subcutaneous, 2x/day  isosorbide mononitrate (IMDUR) 24 hr extended release tablet, 60 mg, Oral, QAM  lidocaine-menthol (LIDOPATCH) 3.6%-1.25% patch, 1 Patch, Transdermal, Daily  magnesium hydroxide (MILK OF MAGNESIA) 463m per 573moral liquid, 15 mL, Oral, Daily PRN  magnesium oxide (MAG-OX) 40032m240m39memental magnesium) tablet, 400 mg, Oral, 2x/day  NS flush syringe, 3 mL, Intracatheter, Q8HRS  NS flush syringe, 3 mL, Intracatheter, Q1H PRN  NS flush syringe, 3 mL, Intracatheter, Q8HRS  NS flush syringe, 3 mL, Intracatheter, Q1H PRN  NS flush syringe, 3 mL, Intracatheter, Q8HRS  NS flush syringe, 3 mL, Intracatheter, Q1H PRN  ondansetron (ZOFRAN) 2 mg/mL injection, 4 mg, Intravenous, Q6H PRN  oxyCODONE (ROXICODONE) immediate release tablet, 7.5 mg, Oral, Q6H PRN  pantoprazole (PROTONIX) delayed release tablet, 40 mg, Oral, Daily  paricalcitol (ZEMPLAR) 5 mcg/mL injection, 2 mcg, Intravenous, Give in Dialysis  polyethylene glycol (MIRALAX) oral packet, 17 g, Oral, Daily  ranolazine (RANEXA) extended release tablet, 500 mg, Oral, 2x/day  sevelamer carbonate (RENVELA) tablet, 800 mg, Oral, 3x/day-Meals  sodium zirconium cyclosilicate (LOKELMA) powder, 10 g, Oral, Daily  torsemide (DEMADEX)  tablet, 20 mg, Oral, 2x/day  [Held by provider] Warfarin - Pharmacist to Dose per Protocol, , Does not apply, Daily PRN      Summary of Lab Work and Diagnostic Studies:   I/O last 24 hours:      Intake/Output Summary (Last 24 hours) at 04/04/2021 1258  Last data filed at 04/04/2021 1206  Gross per 24 hour   Intake 50 ml   Output 300 ml   Net -250 ml     Labs:  CBC   Recent Labs     04/02/21  0300 04/03/21  0134 04/04/21  0300   WBC 13.7* 13.6* 11.5*  HGB 7.8* 9.1* 9.5*   HCT 25.6* 29.1* 31.1*   PLTCNT 440* 416* 403*      Chemistries   Recent Labs     04/02/21  0300 04/03/21  0134 04/04/21  0300   SODIUM 137 134* 133*   POTASSIUM 4.6 4.1 4.3   CHLORIDE 92* 94* 92*   CO2 28 31* 27   BUN 27* 16 26*   CREATININE 4.93* 3.54* 4.84*   CALCIUM 8.5 8.5 8.9   MAGNESIUM  --   --  1.6*   PHOSPHORUS  --   --  5.1*       Liver Enzymes   No results for input(s): TOTALPROTEIN, ALBUMIN, AST, ALT, ALKPHOS, LIPASE in the last 72 hours.   Inflammatory Markers   No results for input(s): CRP, ESR, LACTICACID, LDH, FERRITIN in the last 72 hours.    Invalid input(s): PROCALCITONIN, D-DIMER   Cardiac and Coags   Recent Labs     04/02/21  0300 04/03/21  0050 04/04/21  0300   INR 2.14 1.77 1.38      Lipid Panel   No results for input(s): HDL in the last 72 hours.    Invalid input(s): LDL, CHOL     Microbiology:  No results found for any visits on 03/30/21 (from the past 96 hour(s)).  Imaging:            Nutrition: DIET RENAL DIABETIC HIGH PROTEIN Carb Amount: 1500 Cal = 45 Carbs per meal- 3 Carb choices; Restrict fluids to: 1400 ML  DIET NPO - SPECIFIC DATE & TIME EXCEPT ALL MEDS WITH SIPS OF WATER  DVT PPx: heparin   Code Status: Full Code    Disposition:  TBD       Tia Alert, MD  This note may have been partially generated using MModal Fluency Direct system and there may be some incorrect words, spellings, and punctuation that were not noted in checking the note before saving.

## 2021-04-05 ENCOUNTER — Encounter (HOSPITAL_COMMUNITY)
Admission: EM | Disposition: A | Discharge status: Skilled Nursing Facility | Payer: Self-pay | Source: Rehabilitation Facility | Attending: Internal Medicine

## 2021-04-05 ENCOUNTER — Inpatient Hospital Stay (HOSPITAL_COMMUNITY): Payer: Medicare (Managed Care)

## 2021-04-05 DIAGNOSIS — I249 Acute ischemic heart disease, unspecified: Secondary | ICD-10-CM

## 2021-04-05 DIAGNOSIS — R0789 Other chest pain: Secondary | ICD-10-CM

## 2021-04-05 LAB — CBC WITH DIFF
BASOPHIL #: <0.1 10*3/uL (ref <=0.20)
BASOPHIL %: 1 %
EOSINOPHIL #: 0.34 10*3/uL (ref <=0.50)
EOSINOPHIL %: 3 %
HCT: 30 % — ABNORMAL LOW (ref 38.9–52.0)
HGB: 9 g/dL — ABNORMAL LOW (ref 13.4–17.5)
IMMATURE GRANULOCYTE #: <0.1 10*3/uL (ref <0.10)
IMMATURE GRANULOCYTE %: 0 % (ref 0–1)
LYMPHOCYTE #: 1.9 10*3/uL (ref 1.00–4.80)
LYMPHOCYTE %: 16 %
MCH: 27 pg (ref 26.0–32.0)
MCHC: 30 g/dL — ABNORMAL LOW (ref 31.0–35.5)
MCV: 90.1 fL (ref 78.0–100.0)
MONOCYTE #: 1.97 10*3/uL — ABNORMAL HIGH (ref 0.20–1.10)
MONOCYTE %: 16 %
MPV: 9.9 fL (ref 8.7–12.5)
NEUTROPHIL #: 7.81 10*3/uL — ABNORMAL HIGH (ref 1.50–7.70)
NEUTROPHIL %: 64 %
PLATELETS: 395 10*3/uL (ref 150–400)
RBC: 3.33 10*6/uL — ABNORMAL LOW (ref 4.50–6.10)
RDW-CV: 15.3 % (ref 11.5–15.5)
WBC: 12.2 10*3/uL — ABNORMAL HIGH (ref 3.7–11.0)

## 2021-04-05 LAB — ECG 12 LEAD
Atrial Rate: 72 {beats}/min
Calculated P Axis: 29 degrees
Calculated R Axis: 121 degrees
Calculated T Axis: -5 degrees
PR Interval: 178 ms
QRS Duration: 154 ms
QT Interval: 462 ms
QTC Calculation: 505 ms
Ventricular rate: 72 {beats}/min

## 2021-04-05 LAB — BASIC METABOLIC PANEL
ANION GAP: 15 mmol/L — ABNORMAL HIGH (ref 4–13)
BUN/CREA RATIO: 6 (ref 6–22)
BUN: 33 mg/dL — ABNORMAL HIGH (ref 8–25)
CALCIUM: 9 mg/dL (ref 8.5–10.0)
CHLORIDE: 95 mmol/L — ABNORMAL LOW (ref 96–111)
CO2 TOTAL: 26 mmol/L (ref 22–30)
CREATININE: 5.65 mg/dL — ABNORMAL HIGH (ref 0.75–1.35)
ESTIMATED GFR: 12 mL/min/BSA — ABNORMAL LOW (ref >=60)
GLUCOSE: 119 mg/dL (ref 65–125)
POTASSIUM: 5 mmol/L (ref 3.5–5.1)
SODIUM: 136 mmol/L (ref 136–145)

## 2021-04-05 LAB — POC BLOOD GLUCOSE (RESULTS)
GLUCOSE, POC: 132 mg/dl (ref 80–130)
GLUCOSE, POC: 180 mg/dl (ref 80–130)
GLUCOSE, POC: 177 mg/dl (ref 80–130)

## 2021-04-05 LAB — PT/INR
INR: 1.25 (ref <=5.00)
PROTHROMBIN TIME: 14.9 seconds — ABNORMAL HIGH (ref 9.7–13.6)

## 2021-04-05 LAB — PTT (PARTIAL THROMBOPLASTIN TIME): APTT: 57.6 seconds — ABNORMAL HIGH (ref 26.0–39.0)

## 2021-04-05 LAB — MAGNESIUM: MAGNESIUM: 2 mg/dL (ref 1.8–2.6)

## 2021-04-05 LAB — TYPE AND SCREEN
ABO/RH(D): O POS
ANTIBODY SCREEN: NEGATIVE

## 2021-04-05 SURGERY — CORONARY ANGIOGRAPHY
Laterality: Left

## 2021-04-05 MED ORDER — WARFARIN 3 MG TABLET
6.0000 mg | ORAL_TABLET | Freq: Once | ORAL | Status: AC
Start: 2021-04-05 — End: 2021-04-05
  Administered 2021-04-05: 6 mg via ORAL
  Filled 2021-04-05: qty 2

## 2021-04-05 MED ORDER — LIDOCAINE (PF) 10 MG/ML (1 %) INJECTION SOLUTION
INTRAMUSCULAR | Status: AC
Start: 2021-04-05 — End: 2021-04-05
  Filled 2021-04-05: qty 30

## 2021-04-05 MED ORDER — HEPARIN (PORCINE) 1,000 UNIT/ML INJECTION SOLUTION
Freq: Once | INTRAMUSCULAR | Status: DC | PRN
Start: 2021-04-05 — End: 2021-04-05
  Administered 2021-04-05: 5000 [IU] via INTRAVENOUS

## 2021-04-05 MED ORDER — HYDROMORPHONE 0.5 MG/0.5 ML INJECTION SYRINGE
0.4000 mg | INJECTION | Freq: Once | INTRAMUSCULAR | Status: AC
Start: 2021-04-05 — End: 2021-04-05
  Administered 2021-04-05: 0.4 mg via INTRAVENOUS
  Filled 2021-04-05: qty 0.5

## 2021-04-05 MED ORDER — COLLAGENASE CLOSTRIDIUM HISTOLYTICUM 250 UNIT/GRAM TOPICAL OINTMENT
TOPICAL_OINTMENT | Freq: Every day | CUTANEOUS | Status: DC
Start: 2021-04-05 — End: 2021-04-06
  Administered 2021-04-06: 0 g via TOPICAL
  Filled 2021-04-05: qty 30

## 2021-04-05 MED ORDER — HEPARIN (PORCINE) IN NS (PF) 2 UNIT/ML IV
INTRAVENOUS | Status: AC | PRN
Start: 2021-04-05 — End: 2021-04-05
  Administered 2021-04-05: 3 mL/h via INTRAVENOUS

## 2021-04-05 MED ORDER — LIDOCAINE (PF) 10 MG/ML (1 %) INJECTION SOLUTION
Freq: Once | INTRAMUSCULAR | Status: DC | PRN
Start: 2021-04-05 — End: 2021-04-05
  Administered 2021-04-05: 5 mL via INTRADERMAL

## 2021-04-05 MED ORDER — IOPAMIDOL 370 MG IODINE/ML (76 %) INTRAVENOUS SOLUTION
Freq: Once | INTRAVENOUS | Status: DC | PRN
Start: 2021-04-05 — End: 2021-04-05
  Administered 2021-04-05: 65 mL via INTRA_ARTERIAL

## 2021-04-05 MED ORDER — NITROGLYCERIN 50 MCG/ML IV DILUTION - FOR ANES
INJECTION | Freq: Once | INTRAVENOUS | Status: DC | PRN
Start: 2021-04-05 — End: 2021-04-05
  Administered 2021-04-05: 200 ug via INTRA_ARTERIAL
  Administered 2021-04-05: 100 ug via INTRA_ARTERIAL

## 2021-04-05 MED ORDER — HEPARIN (PORCINE) (PF) 2,000 UNIT/1,000 ML IN 0.9 % SODIUM CHLORIDE IV
Freq: Once | INTRAVENOUS | Status: DC | PRN
Start: 2021-04-05 — End: 2021-04-05
  Administered 2021-04-05: 2000 [IU]

## 2021-04-05 MED ORDER — NICARDIPINE 25 MG/10 ML INTRAVENOUS SOLUTION
Freq: Once | INTRAVENOUS | Status: DC | PRN
Start: 2021-04-05 — End: 2021-04-05
  Administered 2021-04-05: 200 ug via INTRA_ARTERIAL

## 2021-04-05 MED ORDER — FENTANYL (PF) 50 MCG/ML INJECTION SOLUTION
INTRAMUSCULAR | Status: AC
Start: 2021-04-05 — End: 2021-04-05
  Filled 2021-04-05: qty 2

## 2021-04-05 MED ORDER — HEPARIN (PORCINE) 5,000 UNIT/ML INJECTION SOLUTION
INTRAMUSCULAR | Status: AC
Start: 2021-04-05 — End: 2021-04-05
  Filled 2021-04-05: qty 1

## 2021-04-05 MED ORDER — MIDAZOLAM 1 MG/ML INJECTION SOLUTION
INTRAMUSCULAR | Status: AC
Start: 2021-04-05 — End: 2021-04-05
  Filled 2021-04-05: qty 2

## 2021-04-05 MED ORDER — HEPARIN (PORCINE) (PF) 2,000 UNIT/1,000 ML IN 0.9 % SODIUM CHLORIDE IV
INTRAVENOUS | Status: AC
Start: 2021-04-05 — End: 2021-04-05
  Filled 2021-04-05: qty 2000

## 2021-04-05 SURGICAL SUPPLY — 26 items
APPL 70% ISPRP 2% CHG 26ML 13._2X13.2IN CHLRPRP PREP DEHP-FR (MED SURG SUPPLIES) ×1
APPL 70% ISPRP 2% CHG 26ML CHLRPRP HI-LT ORNG PREP STRL LF  DISP CLR (MED SURG SUPPLIES) ×1 IMPLANT
CATH ANGIO 5FR FL3.5 CURVE 100CM EXPO FEM TRILON (CATHETERS) ×1
CATH ANGIO 5FR FL3.5 CURVE 100_CM EXPO FULL LGTH WRE BRD RBST (CATHETERS) ×1
CATH ANGIO 5FR FR4 CURVE 100CM EXPO FULL LGTH WRE BRD RBST SHAFT VENTRIC TRILON (CATHETERS) ×1
CATH ANGIO 5FR FR4 CURVE 100CM_EXPO FULLLGTH WRE BRD RBST (CATHETERS) ×1
CONV USE ITEM 338050 - PACK SURG CSTM CTH LB LF (CUSTOM TRAYS & PACK) ×1
DEVICE COMPRESS TR BAND 24CM 2 BAL TRNSPR ADJ STRAP REG RADIAL ART (MED SURG SUPPLIES) ×1 IMPLANT
DEVICE COMPRESS TR BAND 24CM 2_BAL TRNSPR ADJ STRAP AIR TRTN (MED SURG SUPPLIES) ×1
DISCONTINUED USE 327881 - CATH ANGIO 5FR FL3.5 CURVE 100CM EXPO FEM TRILON (CATHETERS) ×1 IMPLANT
DISCONTINUED USE 329965 - CATH ANGIO 5FR FR4 CURVE 100CM EXPO FULL LGTH WRE BRD RBST SHAFT VENTRIC TRILON (CATHETERS) ×1 IMPLANT
DRAPE OVAL ADH APRTR 30X29IN STRDRP SURG 6X2IN BLU (DRAPE/PACKS/SHEETS/OR TOWEL) ×1 IMPLANT
DRAPE OVAL ADH APRTR 30X29IN S_TRDRP SURG 6X2IN BLU (DRAPE/PACKS/SHEETS/OR TOWEL) ×1
GW START .035IN 4CM 260CM FLXB_SPRG COIL TIP STD SHFT PTFE (WIRE) ×1
GW START RSN .035IN 4CM 260CM PTFE VAS PERI 1.5MM RAD J CURVE TAPER (WIRE) ×1 IMPLANT
KIT INTROD 10CM 6FR 21GA GLIDESHEATH SLNDR .021IN 35MM FLX STR SS HDRPH SHEATH DIL ANT  WL PNCT (INTRODUCER) ×1 IMPLANT
KIT INTROD 10CM 6FR 21GA GLIDE_SHEATH SLNDR .021IN 35MM FLX (INTRODUCER) ×1
KIT INTVN LFT HRT (VASCULAR) ×1
KIT SNGL FLUSH LINE_K0801987 15EA/BX (CATHETERS) ×2 IMPLANT
KIT SURG LFT HRT STRL DISP LF (VASCULAR) ×1 IMPLANT
PACK HEART CATH_DYNJ38683A 6EA/CS (CUSTOM TRAYS & PACK) ×1
PACK SURG CSTM CTH LB LF (CUSTOM TRAYS & PACK) ×1 IMPLANT
SHIELD RAD 17X12IN X-DRP .25MM ABS XRY CTOUT LEADFREE OVRWRAP 35X35IN STRL DISP (MED SURG SUPPLIES) ×1 IMPLANT
SHIELD RAD 17X12IN X-DRP .25MM_ABS XRY CTOUT LEADFREE (MED SURG SUPPLIES) ×1
STOPCOCK ANGIO 400 PSI NAMIC 3W ROT ADPR LFT PORT STRL LF (IV TUBING & ACCESSORIES) ×1 IMPLANT
STOPCOCK ANGIO 400 PSI NAMIC 3_W ROT ADPR LFT PORT STRL (IV TUBING & ACCESSORIES) ×1

## 2021-04-05 NOTE — Brief Op Note (Addendum)
Coronary angiogram performed for ongoing chest pain.  His medical history significant for coronary disease status post PCI and end-stage renal disease on dialysis, hypertension, hyperlipidemia, diabetes, acute on chronic anemia.     Coronary angiogram showed 15-20% mid RCA stenosis, mild InStent restenosis to LAD stents, mild coronary artery disease to left main coronary artery and left circumflex artery.  CTO right PL branch(small, ), and receiving collaterals from left circumflex.  LVEDP 28-30    Plan:    I recommend medical therapy  Chest pain not related to cardiac origin  Evaluate for non cardiac chest pain .   LVEDP is 28-57mm of Hg. I recommend frequent hemodialysis and diuresis.  I will sign off for now. Please call us back if clinical condition changes. Marland Kitchen

## 2021-04-05 NOTE — Progress Notes (Signed)
Hospitalist Progress Note     Max [name]. 51 y.o. male   Date of Birth: 08/06/1970  Date of Admit:   03/30/2021   Date of Service: 04/05/2021     Attending: Zane Herald, MD  Code Status:Full Code   PCP: Azzie Roup, MD       Assessments/plan:  Hospital Problems (* Primary Problem)    Diagnosis Date Noted   . *Chest pain at rest 03/30/2021   . Hypomagnesemia 04/04/2021   . End stage renal disease on dialysis (CMS [place]) 03/30/2021   . Cardiovascular disease 03/30/2021   . History of right below knee amputation (CMS [place]) 03/30/2021   . ACS (acute coronary syndrome) (CMS HCC) 03/30/2021     Added automatically from request for surgery [id number]     . Hyperkalemia 02/28/2021   . Anemia 02/24/2021      Resolved Hospital Problems   No resolved problems to display.      1. Chest pain  a. Patient has a history of CAD status post multiple PCI  b. EKG showed some lateral T-wave changes and troponins were flat trend  c. Continue ASA, Coreg, Imdur, Ranexa   d. Nec med stress test showed a small reversible defect. Started heparin drip stopped warfarin. Planning to optimize anemia for left heart catheterization (schedueld for 04/05/21)  e. Spoke with Cardiology and the felt that patient did not needed any intervention after heart catheterization    2. Hypomagnesemia & hypokalemia: replace as needed   3. Normocytic anemia   a. H&H improving s/p PRBC transfusion on 04/02/21   b. Hemoglobin was 9.0 today  4. History of DVT  a. Not on anticoagulation at home & no imaging studies to prove this, will continue holding the warfarin for now (currently on heparin drip for the chest pain)    5. End-stage renal disease on hemodialysis. Noncompliant with dialysis. Nephrology consulted .  Repeat labs ordered for tomorrow  6. Type 2 diabetes.Continue basal bolus insulin   7. Hyperlipidemia/peripheral vascular disease. Continue aspirin statin and Zetia    Subjective   Hospital Course Summary:  Max Schreur. is a 51 y.o.  male with PMH of admtitted with c/o intermittent chest pain     Subjective history:  Patient reports still having intermittent chest pain    04/05/2021---The patient was advised that I spoke with Cardiology and they felt he could be discharged and there was no need for any intervention at this time from a cardiac standpoint.  Patient will have to have his Coumadin resumed and level therapeutic before stopping the heparin.  Nephrology is following him for dialysis.  Patient complains of some severe headache the left frontoparietal area for which she states he usually goes the emergency room to get a "shot".  I will give him a small 1 time dose of Dilaudid.  Repeat labs have been ordered for tomorrow.    Medical History     PMHx:    Past Medical History:   Diagnosis Date   . Arthropathy    . CKD (chronic kidney disease), stage III (CMS HCC) 08/25/2019   . Congestive heart failure (CMS HCC)    . Coronary artery disease    . Diabetes mellitus, type 2 (CMS HCC)    . Headache    . HTN (hypertension)    . Hx MRSA infection      Allergies:    Allergies   Allergen Reactions   . Toradol [Ketorolac] Rash   . Ultram [  Tramadol] Rash   . Benadryl [Diphenhydramine]  Other Adverse Reaction (Add comment)     irritable   . Penicillin G Benethamine       Social History  Social History     Tobacco Use   . Smoking status: Never   . Smokeless tobacco: Never   Vaping Use   . Vaping Use: Never used   Substance Use Topics   . Alcohol use: Never   . Drug use: Never     Family History  Family Medical History:     Problem Relation (Age of Onset)    Cancer Father    Coronary Artery Disease Mother, Father       Home Meds:   DULoxetine, Warfarin, amLODIPine, aspirin, atorvastatin, bacitracin, carvediloL, cyclobenzaprine, docusate sodium, esomeprazole magnesium, ezetimibe, ferrous sulfate, gabapentin, insulin glargine-yfgn, insulin lispro, isosorbide mononitrate, lidocaine, oxyCODONE, polyethylene glycol, ranolazine, sevelamer, sodium zirconium  cyclosilicate, sucralfate, and torsemide           Objective    Objective Findings     Physical Exam:  BP 112/73   Pulse 70   Temp 36.5 C (97.7 F)   Resp 14   Ht 1.727 m ('5\' 8"'$ )   Wt 93.8 kg (206 lb 12.8 oz)   SpO2 97%   BMI 31.44 kg/m    General: no apparent distress, vital signs reviewed  Resp: normal WOB, CTAB, no r/r/w  CV: RRR, nlS1S2, no m/r/g  GI: +BS, soft, NT, ND   Ext: dry, warm, no c/c/e   Neuro:  Alert and oriented, no focal deficits appreciated   Skin: intact, no erythema, no rash/lesions noted    Inpatient Medications  aspirin (ECOTRIN) enteric coated tablet 81 mg, 81 mg, Oral, Daily  atorvastatin (LIPITOR) tablet, 80 mg, Oral, QPM  carvedilol (COREG) tablet, 25 mg, Oral, 2x/day-Food  correctional insulin lispro (HUMALOG) 100 units/mL injection, 2-10 Units, Subcutaneous, 3x/day AC  cyclobenzaprine (FLEXERIL) tablet, 5 mg, Oral, 3x/day PRN  docusate sodium (COLACE) capsule, 100 mg, Oral, 2x/day  DULoxetine (CYMBALTA) delayed release capsule, 60 mg, Oral, Daily  epoetin alfa (PROCRIT) 20,000 units/mL injection, 150 Units/kg (Adjusted), Intravenous, Give in Dialysis  ezetimibe (ZETIA) tablet, 10 mg, Oral, QPM  ferrous sulfate 325 mg (elemental IRON 65 mg) tablet, 325 mg, Oral, Daily  gabapentin (NEURONTIN) capsule, 400 mg, Oral, 3x/day  heparin 1,000 units/mL injection IV bolus, 1-5 mL, Intercatheter, Dialysis x1 PRN  heparin 25,000 units in 0.45% NS 250 mL infusion, 12 Units/kg/hr (Adjusted), Intravenous, Continuous  insulin glargine-yfgn 100 Units/mL SubQ pen, 8 Units, Subcutaneous, 2x/day  isosorbide mononitrate (IMDUR) 24 hr extended release tablet, 60 mg, Oral, QAM  lidocaine-menthol (LIDOPATCH) 3.6%-1.25% patch, 1 Patch, Transdermal, Daily  magnesium hydroxide (MILK OF MAGNESIA) 469m per 568moral liquid, 15 mL, Oral, Daily PRN  magnesium oxide (MAG-OX) 40061m240m69memental magnesium) tablet, 400 mg, Oral, 2x/day  NS flush syringe, 3 mL, Intracatheter, Q8HRS  NS flush syringe, 3 mL,  Intracatheter, Q1H PRN  NS flush syringe, 3 mL, Intracatheter, Q8HRS  NS flush syringe, 3 mL, Intracatheter, Q1H PRN  NS flush syringe, 3 mL, Intracatheter, Q8HRS  NS flush syringe, 3 mL, Intracatheter, Q1H PRN  ondansetron (ZOFRAN) 2 mg/mL injection, 4 mg, Intravenous, Q6H PRN  oxyCODONE (ROXICODONE) immediate release tablet, 7.5 mg, Oral, Q6H PRN  pantoprazole (PROTONIX) delayed release tablet, 40 mg, Oral, Daily  paricalcitol (ZEMPLAR) 5 mcg/mL injection, 2 mcg, Intravenous, Give in Dialysis  polyethylene glycol (MIRALAX) oral packet, 17 g, Oral, Daily  ranolazine (RANEXA) extended release tablet, 500  mg, Oral, 2x/day  sevelamer carbonate (RENVELA) tablet, 800 mg, Oral, 3x/day-Meals  torsemide (DEMADEX) tablet, 20 mg, Oral, 2x/day  warfarin (COUMADIN) tablet, 6 mg, Oral, Once 2100  Warfarin - Pharmacist to Dose per Protocol, , Does not apply, Daily PRN      Summary of Lab Work and Diagnostic Studies:   I/O last 24 hours:    No intake or output data in the 24 hours ending 04/05/21 1443  Labs:  CBC   Recent Labs     04/03/21  0134 04/04/21  0300 04/05/21  0454   WBC 13.6* 11.5* 12.2*   HGB 9.1* 9.5* 9.0*   HCT 29.1* 31.1* 30.0*   PLTCNT 416* 403* 395      Chemistries   Recent Labs     04/03/21  0134 04/04/21  0300 04/05/21  0453   SODIUM 134* 133* 136   POTASSIUM 4.1 4.3 5.0   CHLORIDE 94* 92* 95*   CO2 31* 27 26   BUN 16 26* 33*   CREATININE 3.54* 4.84* 5.65*   CALCIUM 8.5 8.9 9.0   MAGNESIUM  --  1.6* 2.0   PHOSPHORUS  --  5.1*  --        Liver Enzymes   No results for input(s): TOTALPROTEIN, ALBUMIN, AST, ALT, ALKPHOS, LIPASE in the last 72 hours.   Inflammatory Markers   No results for input(s): CRP, ESR, LACTICACID, LDH, FERRITIN in the last 72 hours.    Invalid input(s): PROCALCITONIN, D-DIMER   Cardiac and Coags   Recent Labs     04/03/21  0050 04/04/21  0300 04/05/21  0453   INR 1.77 1.38 1.25      Lipid Panel   No results for input(s): HDL in the last 72 hours.    Invalid input(s): LDL, CHOL      Microbiology:  No results found for any visits on 03/30/21 (from the past 96 hour(s)).  Imaging:    Results for orders placed or performed during the hospital encounter of 03/30/21 (from the past 24 hour(s))   XR CHEST AP AND LATERAL     Status: None    Narrative    EXAM: XR CHEST AP AND LATERAL    HISTORY: pre op testing    COMPARISON: 03/30/2021.    FINDINGS: A dual lumen right internal jugular central venous catheter is unchanged.    The heart is enlarged. The pulmonary vascularity is mildly accentuated and there are slight increased interstitial changes in the lung. No large pleural effusion.      Impression    1. Cardiomegaly with mild pulmonary vascular congestion slightly increased from the prior study. Question increasing CHF and pulmonary edema.  2. There are linear type interstitial changes in the left lung base and right lung likely representing areas of atelectasis.      Radiologist location ID: [id number]            Nutrition: DIET RENAL Restrict fluids to: 1400 ML  DVT PPx: heparin   Code Status: Full Code    Disposition:  TBD       Paula Compton, MD  This note may have been partially generated using MModal Fluency Direct system and there may be some incorrect words, spellings, and punctuation that were not noted in checking the note before saving.

## 2021-04-05 NOTE — Nurses Notes (Signed)
Covered cath site with dressing and applied splint

## 2021-04-05 NOTE — Nurses Notes (Signed)
Patient back to the floor, TR band in place.

## 2021-04-05 NOTE — Wound Therapy (Signed)
Received referral to see pt regarding impaired skin integrity. Spoke to nurse who stated that pt had been given pain meds earlier and was sleeping soundly. Pt's lunch tray untouched on table. Pt awakened when spoken to, somewhat difficult to understand at times. [place] dressings removed from his rt bka stump wound and the left lateral heel wound. Ulcers cleansed with wound cleanser. Rt bka stump wound measures 0.8 x 0.4 cm with yellow wound bed. Ulcer on left lateral heel meausres 0.5 x 1.0 cm with dry, yellow wound bed. Both ulcers were painted with betadine, covered with optifoam dressings. Pt to have dialysis shortly, for possible d/c back to SNF in [place]. Pt stated that he is from [facility], [place] but became sick and "ended up here". Pt would benefit from santyl ointment or other debriding agent to expedite healing.   1650: Sent epic chat to Dr. Arlan Organ asking about order for santyl ointment.  Threasa Beards, RN  04/05/2021, 17:00

## 2021-04-05 NOTE — Progress Notes (Signed)
PATIENT NAME: PIKE, SCANTLEBURY   HOSPITAL NUMBER:  [id number]  DATE OF SERVICE: 04/03/2021  DATE OF BIRTH:  16-Nov-1970    PROGRESS NOTE    The patient is on dialysis Monday, Wednesday, and Friday, and had dialysis yesterday and did well.  The patient had a reversible defect on the cardiac stress test.  He needs to have a heart catheterization on Monday.  Labs are okay from today.      Lungs are clear.  Heart is regular rate and rhythm.  Extremities shows no edema. Troponin levels are stable.  He is on a heparin drip.  He is on Coreg for hypertension.  He is on Renvela as a phosphorus binder.      Labs looking good right now.  We will do dialysis on Monday.  Possible heart catheterization on Monday.  Hemoglobin is stable.  Lungs are clear.  Heart is regular rate and rhythm.  Extremities shows no edema.  Vital signs look good.        Zoanne Newill Adriana Simas, DO                DD:  04/03/2021 09:54:01  DT:  04/05/2021 04:05:48 [place]  D#:  [id number]

## 2021-04-05 NOTE — Nurses Notes (Signed)
H2O alarm active. Machine set and tested as ordered. Patient to treatment room. Verbal consent obtained. Assessment completed. Patient denies needs. CVC prepped and accessed. Wasted 5ml blood x 2 ports. Treatment initiated at this time. Blood lines secured to patient. Will continue to monitor patient during treatment.

## 2021-04-05 NOTE — Care Management Notes (Addendum)
[facility]  Care Management Note    Patient Name: Max [name].  Date of Birth: [birth year]  Sex: male  Date/Time of Admission: 03/30/2021  2:32 PM  Room/Bed: 311/B  Payor: HUMANA MEDICARE / Plan: HUMANA MEDICARE / Product Type: MEDICARE MC /    LOS: 5 days   PCP: Azzie Roup, MD    Admitting Diagnosis:  Chest pain at rest [R07.9]    Assessment:   Patient's nurse Tim let me know that doctor might discharge patient today.  I called [facility] with [facility] and asked if patient could return today.  He said he could return and he'd need a MAR.  I sent MAR in allscripts.    15:40  I called [facility] in [place] to see if patient still has a chair time.  I've left messages to different people at number [phone] and no return phone call.  I let Matt at [facility] know and Dr. Arlan Organ know in epic chat.    Discharge Plan:  SNF Return (Medicare certified) (code 3)      The patient will continue to be evaluated for developing discharge needs.     Case Manager: Lynne Leader, CASE MANAGER  Phone: [phone]

## 2021-04-05 NOTE — Nurses Notes (Signed)
Hemodialysis treatment completed at this time with 2kg of fluid removed, patient tolerated treatment well. Blood returned without difficulty. CVC flushed with saline and locked with heparin. Blue sterile caps applied. Patient denies needs. Report to primary RN. Patient to return to floor via transport.

## 2021-04-05 NOTE — Care Plan (Signed)
Problem: Adult Inpatient Plan of Care  Goal: Plan of Care Review  Outcome: Ongoing (see interventions/notes)  Flowsheets (Taken 04/05/2021 0339)  Progress: no change  Plan of Care Reviewed With: patient  Goal: Patient-Specific Goal (Individualized)  Outcome: Ongoing (see interventions/notes)  Flowsheets (Taken 04/04/2021 2000)  Patient would like to participate in bedside shift report: Yes  Individualized Care Needs: pain under control  Anxieties, Fears or Concerns: none voiced  Patient-Specific Goals (Include Timeframe): cath tomorrow  Plan of Care Reviewed With: patient  Goal: Absence of Hospital-Acquired Illness or Injury  Outcome: Ongoing (see interventions/notes)  Intervention: Identify and Manage Fall Risk  Recent Flowsheet Documentation  Taken 04/04/2021 2000 by Elsie Saas, RN  Safety Promotion/Fall Prevention: activity supervised  Intervention: Prevent Skin Injury  Recent Flowsheet Documentation  Taken 04/05/2021 0200 by Elsie Saas, RN  Body Position: positioned/repositioned independently  Taken 04/05/2021 0000 by Elsie Saas, RN  Body Position: positioned/repositioned independently  Taken 04/04/2021 2200 by Elsie Saas, RN  Body Position: positioned/repositioned independently  Taken 04/04/2021 2000 by Elsie Saas, RN  Body Position: positioned/repositioned independently  Skin Protection: adhesive use limited  Intervention: Prevent and Manage VTE (Venous Thromboembolism) Risk  Recent Flowsheet Documentation  Taken 04/04/2021 2000 by Elsie Saas, RN  VTE Prevention/Management: anticoagulant therapy maintained  Intervention: Prevent Infection  Recent Flowsheet Documentation  Taken 04/04/2021 2000 by Elsie Saas, RN  Infection Prevention: barrier precautions utilized  Goal: Optimal Comfort and Wellbeing  Outcome: Ongoing (see interventions/notes)  Intervention: Provide Person-Centered Care  Recent Flowsheet Documentation  Taken 04/04/2021 2000 by Elsie Saas, RN  Trust  Relationship/Rapport: care explained   Pt had bath at midnight and NPO after midnight.  Pt slept well thru shift.  Bed al;arm on.  Call light with in reach. Will do final CHG bath at 6am .  Vitals stable will cont to monitor

## 2021-04-05 NOTE — Nurses Notes (Signed)
Have started to remove air from the TR band

## 2021-04-05 NOTE — Progress Notes (Signed)
PATIENT NAME: Max [name], Max [name]   HOSPITAL NUMBER:  [id number]  DATE OF SERVICE: 04/04/2021  DATE OF BIRTH:  [birth year]    PROGRESS NOTE    The patient is on dialysis Monday, Wednesday, Friday.  He came to the hospital because of chest pain.  Cardiology has seen the patient.  He had a right bundle branch block on the EKG.  The patient is having chest pain.  Heart catheterization is scheduled for tomorrow.  We will do dialysis tomorrow.  Currently on beta blocker.  Troponin is mildly elevated.  Complains of chest pain.  Lungs are clear.  Heart is regular rate and rhythm.  Abdomen is soft and nontender.  Bowel sounds are present.  Extremities show no edema.  The patient has a history of coronary artery disease and has had previous stents in the past.  He is on aspirin, Coreg, Imdur, and Ranexa.  He had a small reversible defect on the cardiac stress test. On heparin drip.  Proceed with heart catheterization tomorrow.  Labs are okay. Potassium is okay.  Vital signs are normal.  Currently on heparin drip.  We will continue current management.  Plan on doing dialysis tomorrow.        Bruchy Mikel Adriana Simas, DO                DD:  04/04/2021 09:41:29  DT:  04/05/2021 08:34:51 CB  D#:  [id number]

## 2021-04-06 LAB — COMPREHENSIVE METABOLIC PNL, FASTING
ALBUMIN: 2.5 g/dL — ABNORMAL LOW (ref 3.5–5.0)
ALKALINE PHOSPHATASE: 245 U/L — ABNORMAL HIGH (ref 45–115)
ALT (SGPT): 7 U/L — ABNORMAL LOW (ref 10–55)
ANION GAP: 13 mmol/L (ref 4–13)
AST (SGOT): 8 U/L (ref 8–45)
BILIRUBIN TOTAL: 0.7 mg/dL (ref 0.3–1.3)
BUN/CREA RATIO: 5 — ABNORMAL LOW (ref 6–22)
BUN: 21 mg/dL (ref 8–25)
CALCIUM: 8.4 mg/dL — ABNORMAL LOW (ref 8.5–10.0)
CHLORIDE: 94 mmol/L — ABNORMAL LOW (ref 96–111)
CO2 TOTAL: 31 mmol/L — ABNORMAL HIGH (ref 22–30)
CREATININE: 4.32 mg/dL — ABNORMAL HIGH (ref 0.75–1.35)
ESTIMATED GFR: 16 mL/min/BSA — ABNORMAL LOW (ref >=60)
GLUCOSE: 190 mg/dL — ABNORMAL HIGH (ref 70–99)
POTASSIUM: 4.5 mmol/L (ref 3.5–5.1)
PROTEIN TOTAL: 7.5 g/dL (ref 6.4–8.3)
SODIUM: 138 mmol/L (ref 136–145)

## 2021-04-06 LAB — POC ACT CELITE (RESULTS): ACT CELITE, POC: 181 seconds — ABNORMAL HIGH

## 2021-04-06 LAB — POC BLOOD GLUCOSE (RESULTS)
GLUCOSE, POC: 196 mg/dl (ref 80–130)
GLUCOSE, POC: 236 mg/dl (ref 80–130)

## 2021-04-06 LAB — CBC WITH DIFF
BASOPHIL #: <0.1 10*3/uL (ref <=0.20)
BASOPHIL %: 1 %
EOSINOPHIL #: 0.22 10*3/uL (ref <=0.50)
EOSINOPHIL %: 2 %
HCT: 28.2 % — ABNORMAL LOW (ref 38.9–52.0)
HGB: 8.3 g/dL — ABNORMAL LOW (ref 13.4–17.5)
IMMATURE GRANULOCYTE #: <0.1 10*3/uL (ref <0.10)
IMMATURE GRANULOCYTE %: 0 % (ref 0–1)
LYMPHOCYTE #: 1.81 10*3/uL (ref 1.00–4.80)
LYMPHOCYTE %: 14 %
MCH: 27.2 pg (ref 26.0–32.0)
MCHC: 29.4 g/dL — ABNORMAL LOW (ref 31.0–35.5)
MCV: 92.5 fL (ref 78.0–100.0)
MONOCYTE #: 2.38 10*3/uL — ABNORMAL HIGH (ref 0.20–1.10)
MONOCYTE %: 18 %
MPV: 9.6 fL (ref 8.7–12.5)
NEUTROPHIL #: 8.43 10*3/uL — ABNORMAL HIGH (ref 1.50–7.70)
NEUTROPHIL %: 65 %
PLATELETS: 345 10*3/uL (ref 150–400)
RBC: 3.05 10*6/uL — ABNORMAL LOW (ref 4.50–6.10)
RDW-CV: 15.9 % — ABNORMAL HIGH (ref 11.5–15.5)
WBC: 13 10*3/uL — ABNORMAL HIGH (ref 3.7–11.0)

## 2021-04-06 LAB — MAGNESIUM: MAGNESIUM: 2 mg/dL (ref 1.8–2.6)

## 2021-04-06 LAB — PT/INR
INR: 1.23 (ref <=5.00)
PROTHROMBIN TIME: 14.6 seconds — ABNORMAL HIGH (ref 9.7–13.6)

## 2021-04-06 LAB — PHOSPHORUS: PHOSPHORUS: 4.9 mg/dL — ABNORMAL HIGH (ref 2.4–4.7)

## 2021-04-06 MED ORDER — ENOXAPARIN 80 MG/0.8 ML SUBCUTANEOUS SYRINGE
80.0000 mg | INJECTION | SUBCUTANEOUS | 0 refills | Status: AC
Start: 2021-04-06 — End: ?

## 2021-04-06 MED ORDER — OXYCODONE 5 MG TABLET
7.5000 mg | ORAL_TABLET | Freq: Four times a day (QID) | ORAL | 0 refills | Status: AC | PRN
Start: 2021-04-06 — End: 2021-04-13

## 2021-04-06 MED ORDER — ENOXAPARIN 80 MG/0.8 ML SUBCUTANEOUS SYRINGE
80.0000 mg | INJECTION | SUBCUTANEOUS | Status: DC
Start: 2021-04-06 — End: 2021-04-06
  Administered 2021-04-06: 80 mg via SUBCUTANEOUS
  Filled 2021-04-06: qty 0.8

## 2021-04-06 MED ORDER — WARFARIN 3 MG TABLET
6.0000 mg | ORAL_TABLET | Freq: Once | ORAL | Status: DC
Start: 2021-04-06 — End: 2021-04-06

## 2021-04-06 MED ORDER — MAGNESIUM OXIDE 400 MG (241.3 MG MAGNESIUM) TABLET
400.0000 mg | ORAL_TABLET | Freq: Two times a day (BID) | ORAL | Status: AC
Start: 2021-04-06 — End: ?

## 2021-04-06 MED ORDER — WARFARIN - PHARMACIST TO DOSE PER PROTOCOL
Status: AC
Start: 2021-04-06 — End: ?

## 2021-04-06 MED ORDER — WARFARIN 6 MG TABLET
6.0000 mg | ORAL_TABLET | ORAL | 0 refills | Status: DC
Start: 2021-04-06 — End: 2021-05-18

## 2021-04-06 MED ORDER — COLLAGENASE CLOSTRIDIUM HISTOLYTICUM 250 UNIT/GRAM TOPICAL OINTMENT
TOPICAL_OINTMENT | Freq: Every day | CUTANEOUS | Status: AC
Start: 2021-04-06 — End: 2021-04-19

## 2021-04-06 NOTE — Care Management Notes (Signed)
Referral Information  ++++++ Placed Provider #1 ++++++  Case Manager: Raleigh Callas  Provider Type: Dialysis Center  Provider Name: [facility] [phone] D-AV)  Address:  [address]  [place], [place] [id number]  Contact: [facility] [place]    Phone: [phone] x  Fax:   Fax: [phone]

## 2021-04-06 NOTE — Care Plan (Signed)
Problem: Adult Inpatient Plan of Care  Goal: Plan of Care Review  Outcome: Ongoing (see interventions/notes)  Flowsheets (Taken 04/06/2021 0420)  Progress: no change  Plan of Care Reviewed With: patient  Goal: Patient-Specific Goal (Individualized)  Outcome: Ongoing (see interventions/notes)  Goal: Absence of Hospital-Acquired Illness or Injury  Outcome: Ongoing (see interventions/notes)  Intervention: Identify and Manage Fall Risk  Recent Flowsheet Documentation  Taken 04/05/2021 2000 by Elsie Saas, RN  Safety Promotion/Fall Prevention: activity supervised  Intervention: Prevent Skin Injury  Recent Flowsheet Documentation  Taken 04/06/2021 0400 by Elsie Saas, RN  Body Position: positioned/repositioned independently  Taken 04/06/2021 0200 by Elsie Saas, RN  Body Position: positioned/repositioned independently  Taken 04/06/2021 0000 by Elsie Saas, RN  Body Position: positioned/repositioned independently  Taken 04/05/2021 2200 by Elsie Saas, RN  Body Position: positioned/repositioned independently  Taken 04/05/2021 2000 by Elsie Saas, RN  Body Position: positioned/repositioned independently  Skin Protection: adhesive use limited  Intervention: Prevent and Manage VTE (Venous Thromboembolism) Risk  Recent Flowsheet Documentation  Taken 04/05/2021 2000 by Elsie Saas, RN  VTE Prevention/Management: compression stockings initiated  Intervention: Prevent Infection  Recent Flowsheet Documentation  Taken 04/05/2021 2000 by Elsie Saas, RN  Infection Prevention: barrier precautions utilized   Pt up and down all night.  Pt complains of pain.  Vitals stable call light with in reach will cont to monitor. Bed alarm on

## 2021-04-06 NOTE — Nurses Notes (Signed)
Called report to nurse rose

## 2021-04-06 NOTE — Care Management Notes (Signed)
Referral Information  ++++++ Placed Provider #1 ++++++  Case Manager: Raleigh Callas  Provider Type: Nursing Home/SNF-Return  Provider Name: [facility]  Address:  [address]  [place], [place] [address]  Contact: Rudolpho Sevin Days    Phone: [phone] x  Fax:   Fax: [phone]

## 2021-04-06 NOTE — Care Management Notes (Addendum)
[facility]  Care Management Note    Patient Name: Max [name].  Date of Birth: December 02, 1970  Sex: male  Date/Time of Admission: 03/30/2021  2:32 PM  Room/Bed: 311/B  Payor: HUMANA MEDICARE / Plan: HUMANA MEDICARE / Product Type: MEDICARE MC /    LOS: 6 days   PCP: Azzie Roup, MD    Admitting Diagnosis:  Chest pain at rest [R07.9]    Assessment:   Matt with [facility] said patient can return to [facility] center today.  I will send a new MAR in allscripts.  [place] [facility] said OK for patient to return to schedule MWF at 5 am.  Patient's INR low and Matt with [facility] said patient can still return and they can do coumadin bridging with Lovenox and labs at snf.  I let Dr. Arlan Organ and Octavia Bruckner, RN know in epic chat.    11:25 I sent MAR in allscripts to [facility] at [facility].    Discharge Plan:  SNF Return (Medicare certified) (code 3)      The patient will continue to be evaluated for developing discharge needs.     Case Manager: Lynne Leader, CASE MANAGER  Phone: [phone]

## 2021-04-06 NOTE — Progress Notes (Signed)
PATIENT NAME: Max [name], Max [name]   HOSPITAL NUMBER:  [id number]  DATE OF SERVICE: 04/05/2021  DATE OF BIRTH:  Jan 07, 1971    PROGRESS NOTE    The patient is status post heart catheterization.  He has stenosis of the RCA and in-stent restenosis of LAD.  They are going to do medical therapy.  He is on dialysis Monday, Wednesday, and Friday, and we will continue that.  Labs are stable right now.  Potassium is 5.0.  He is going to get dialysis today.  Vital signs are good.  He looks euvolemic status post BKA.  He has comorbidities of coronary artery disease, end-stage renal disease, right BKA, anemia.  Chest x-ray done today shows cardiomegaly, mild vascular congestion, and increasing CHF.  We will proceed with dialysis today, fluid removal, and correction of her hyperkalemia and monitoring closely.  We will maximize medical therapy with aspirin, statins, and beta blocker.  Blood pressure looks good right now on Coreg.        Hendrik Donath Adriana Simas, DO                DD:  04/05/2021 11:53:18  DT:  04/06/2021 06:59:48 [place]  D#:  [id number]

## 2021-04-07 NOTE — Progress Notes (Signed)
PATIENT NAME: Max, [name]   HOSPITAL NUMBER:  [id number]  DATE OF SERVICE: 04/06/2021  DATE OF BIRTH:  Jul 29, 1970    PROGRESS NOTE    A 51 year old male.  He is here with end-stage renal disease, getting dialysis Monday, Wednesday, and Friday.  He had dialysis yesterday.  He did well.  The patient is here with chest pain, coronary artery disease, history of stents.  History of BKA.  The patient had a heart catheterization here.  The plan is to optimize medical management.  He did not need any intervention.  He has anemia, end-stage renal disease, type 2 diabetes, hyperlipidemia, and hypertension.  We will continue dialysis as outpatient.  He does dialysis at [facility].  The patient had a heart catheterization.  The patient has 20% blockage on the RCA, has restenoses of LAD stents.  Currently, he is doing well.  He looks euvolemic.  Lungs are clear.  Heart is regular rate and rhythm.  Extremities show no edema.  He will be discharged home and do dialysis as outpatient.        Quartez Lagos Adriana Simas, DO                DD:  04/06/2021 11:20:56  DT:  04/07/2021 06:11:03 LL  D#:  [id number]

## 2021-04-07 NOTE — Discharge Summary (Signed)
[facility]  DISCHARGE SUMMARY    Max [name].                          05/03/1970 [id number]   Admission date: 03/30/2021 Discharge date: 04/06/2021   Azzie Roup, MD Prior       Discharge Diagnoses:  Active Hospital Problems    Diagnosis   . Primary Problem: Chest pain at rest   . Hypomagnesemia   . End stage renal disease on dialysis (CMS HCC)   . Cardiovascular disease   . History of right below knee amputation (CMS HCC)   . ACS (acute coronary syndrome) (CMS HCC)   . Hyperkalemia   . Anemia       Active Non-Hospital Problems    Diagnosis Date Noted   . Chest pain 08/25/2019         Hospital Course:   1. Chest pain  a. Patient has a history of CAD status post multiple PCI  b. EKG showed some lateral T-wave changes and troponins were flat trend  c. Continue ASA, Coreg, Imdur, Ranexa   d. Necmed stress test showed a small reversible defect. Started heparin drip stopped warfarin.Planning tooptimize anemia for left heart catheterization (schedueld for 04/05/21)  e. Spoke with Cardiology and the felt that patient did not needed any intervention after heart catheterization    2. Hypomagnesemia & hypokalemia: replace as needed   3. Normocytic anemia  a. H&H improving s/p PRBC transfusion on 04/02/21   b. Hemoglobin was 9.0 today  4. History of DVT  a. Not on anticoagulation at home & no imaging studies to prove this, will continue holding the warfarin for now (currently on heparin drip for the chest pain)    5. End-stage renal disease on hemodialysis. Noncompliant with dialysis. Nephrology consulted .  Repeat labs ordered for tomorrow  6. Type 2 diabetes.Continue basal bolus insulin   7. Hyperlipidemia/peripheral vascular disease. Continue aspirin statin and [facility] Course Summary:  Max [name]. is a 51 y.o. male with PMH of admtitted with c/o intermittent chest pain     Subjective history:  Patient reports still having intermittent chest pain                   04/05/2021---The patient was advised that I spoke with Cardiology and they felt he could be discharged and there was no need for any intervention at this time from a cardiac standpoint.  Patient will have to have his Coumadin resumed and level therapeutic before stopping the heparin.  Nephrology is following him for dialysis.  Patient complains of some severe headache the left frontoparietal area for which she states he usually goes the emergency room to get a "shot".  I will give him a small 1 time dose of Dilaudid.  Repeat labs have been ordered for tomorrow.   04/06/2021---The patient is feeling better and was felt to be stable for discharge from cardiac standpoint.  Nephrology is also cleared him for discharge.  He will need to continue his dialysis 3 days a week.  His headache seems to be better today.  He will be sent to [facility] for continued rehab regarding his leg amputation.  He will be sent on Lovenox bridging but and a decreased dose given his renal failure.  He can get 80 mg every other day until his INR is therapeutic.  Pharmacy is adjusting his  dose.  He is advised that he can return to the emergency room for any deterioration in condition.      PHYSICAL EXAM:  VITALS: BP 103/66   Pulse 73   Temp 36.8 C (98.2 F)   Resp 18   Ht 1.727 m (5\' 8" )   Wt 93.8 kg (206 lb 12.8 oz)   SpO2 99%   BMI 31.44 kg/m       General: no apparent distress, vital signs reviewed  Resp: normal WOB, CTAB, no r/r/w  CV: RRR, nlS1S2, no m/r/g  GI: +BS, soft, NT, ND   Ext: dry, warm, no c/c/e .  Status post BKA on right  Neuro:  Alert and oriented, no focal deficits appreciated   Skin: intact, no erythema, no rash/lesions noted        CBC Results Coag Results   Recent Labs     04/03/21  0134 04/04/21  0300 04/05/21  0454 04/06/21  0533   WBC 13.6* 11.5* 12.2* 13.0*   HGB 9.1* 9.5* 9.0* 8.3*   HCT 29.1* 31.1* 30.0* 28.2*   PLTCNT 416* 403* 395 345     Recent Labs     03/28/21  2324 03/30/21  1516  04/03/21  0134 04/04/21  0300 04/05/21  0454 04/06/21  0533   RBC 2.84*   < > 3.31* 3.45* 3.33* 3.05*   HCT 23.8*   < > 29.1* 31.1* 30.0* 28.2*   HGB 7.8*   < > 9.1* 9.5* 9.0* 8.3*   WBC 12.3*   < > 13.6* 11.5* 12.2* 13.0*   MCHC 32.9   < > 31.3 30.5* 30.0* 29.4*   MCH 27.6   < > 27.5 27.5 27.0 27.2   RDW 15.6*  --   --   --   --   --    MPV  --    < > 9.3 9.6 9.9 9.6   MCV 83.8   < > 87.9 90.1 90.1 92.5   PLTCNT 506*   < > 416* 403* 395 345    < > = values in this interval not displayed.    Recent Labs     04/03/21  0050 04/04/21  0300 04/05/21  0453 04/05/21  0455 04/06/21  0533   PROTHROMTME 21.3* 16.5* 14.9*  --  14.6*   INR 1.77 1.38 1.25  --  1.23   ABORHD  --   --   --  O POSITIVE  --    ABSCR  --   --   --  NEGATIVE  --    UNTORDERED  --   --   --  NOT STATED  --      Recent Labs     04/03/21  0050 04/03/21  0641 04/04/21  0300 04/04/21  0944 04/04/21  1613 04/04/21  2116 04/05/21  0453 04/06/21  0533   APTT 58.3*   < > 60.7*   < > 61.3* 52.8* 57.6*  --    INR 1.77  --  1.38  --   --   --  1.25 1.23   PROTHROMTME 21.3*  --  16.5*  --   --   --  14.9* 14.6*    < > = values in this interval not displayed.        BMP Results ABG Results   Recent Labs     03/09/21  0425 03/09/21  0612 03/28/21  2246 03/30/21  1230 03/30/21  1516 03/31/21  0501 03/31/21  7782  04/03/21  0134 04/03/21  6295 04/04/21  0300 04/04/21  0807 04/05/21  0453 04/05/21  0746 04/05/21  1208 04/05/21  2220 04/06/21  0533 04/06/21  0751 04/06/21  1137   SODIUM 132*   < > 135*   < > 133* 137   < > 134*  --  133*  --  136  --   --   --  138  --   --    POTASSIUM 4.9   < > 4.1   < > 5.3* 5.0   < > 4.1  --  4.3  --  5.0  --   --   --  4.5  --   --    CHLORIDE 100   < > 98   < > 96 98   < > 94*  --  92*  --  95*  --   --   --  94*  --   --    CALCIUM 8.6   < > 9.5   < > 8.7 8.6   < > 8.5  --  8.9  --  9.0  --   --   --  8.4*  --   --    TOTALPROTEIN  --   --  9.2*  --  8.2 7.9  --   --   --   --   --   --   --   --   --  7.5  --   --    MAGNESIUM  1.9  --   --   --   --  1.8  --   --   --  1.6*  --  2.0  --   --   --  2.0  --   --    PHOSPHORUS 3.6  --   --   --   --  5.1*  --   --   --  5.1*  --   --   --   --   --  4.9*  --   --    ALBUMIN  --   --  2.6*  --  2.8* 2.6*  --   --   --   --   --   --   --   --   --  2.5*  --   --    CO2 25   < > 26   < > 22 27   < > 31*  --  27  --  26  --   --   --  31*  --   --    BUN 39*   < > 36*   < > 53* 33*   < > 16  --  26*  --  33*  --   --   --  21  --   --    CREATININE 2.77*   < > 5.74*   < > 7.26* 4.94*   < > 3.54*  --  4.84*  --  5.65*  --   --   --  4.32*  --   --    GLUCOSENF 209*   < > 137*   < > 93 141*   < > 144*  --  154*  --  119  --   --   --  190*  --   --    GLUIP  --    < >  --   --   --   --    < >  --    < >  --    < >  --    < >  180 177  --  196 236    < > = values in this interval not displayed.    Recent Labs     04/03/21  0134 04/04/21  0300 04/05/21  0453 04/06/21  0533   POTASSIUM 4.1 4.3 5.0 4.5        Liver Test Results Cardiac Results   Recent Labs     03/28/21  2246 03/30/21  1516 03/31/21  0501 04/06/21  0533   TOTBILIRUBIN 0.6 0.6 0.6 0.7   BILIRUBINCON 0.3  --   --   --    AST 12 10 10 8    ALT 5* 7* 8* 7*   ALKPHOS 281* 322* 336* 245*    Recent Labs     03/28/21  2246 03/29/21  0150 03/30/21  1440 03/30/21  1806 03/30/21  2349 03/31/21  0501   CPK 29*  --   --   --   --   --    TROPONINI 33*   < > 31* 35* 33* 32*    < > = values in this interval not displayed.        Pregnancy Test Results              Imaging:  XR CHEST AP AND LATERAL    Result Date: 04/05/2021  EXAM: XR CHEST AP AND LATERAL HISTORY: pre op testing COMPARISON: 03/30/2021. FINDINGS: A dual lumen right internal jugular central venous catheter is unchanged. The heart is enlarged. The pulmonary vascularity is mildly accentuated and there are slight increased interstitial changes in the lung. No large pleural effusion.     1. Cardiomegaly with mild pulmonary vascular congestion slightly increased from the prior study. Question  increasing CHF and pulmonary edema. 2. There are linear type interstitial changes in the left lung base and right lung likely representing areas of atelectasis. Radiologist location ID: [id number]     XR AP MOBILE CHEST    Result Date: 03/30/2021  Chest AP portable upright 2:18 PM. Comparison 03/02/2021. HISTORY: Chest pain, since yesterday     1. Tips of right jugular dual-lumen catheter are at the cavoatrial junction, unchanged. 2. Heart is enlarged, with pulmonary vascular congestion (some improvement). 3. There are persistent bibasal opacities, consolidative on the left side, evident previously. There has been partial clearing at the left lateral lung base. No new abnormality is seen. Radiologist location ID: [id number]     XR AP MOBILE CHEST    Result Date: 03/29/2021  [name]. RADIOLOGIST: Betsey Amen, MD XR AP MOBILE CHEST performed on 03/28/2021 11:02 PM CLINICAL HISTORY: chest pain, dialysis. Chest Pain TECHNIQUE: Frontal view of the chest. COMPARISON:  2123 FINDINGS: Right IJ dialysis catheter in place whose tip is in the SVC. Heart size is mildly enlarged.   Mild pulmonary vascular congestion. No pneumothorax. No large effusion.     As above Radiologist location ID: [place] (INCLUDING ABIS)-LOWER (PVR)    Result Date: 03/08/2021  Procedure: A multilevel physiologic examination of the lower extremity arteries was performed using a four cuff technique and continuous wave Doppler to acquire pulse volume recording and segmental limb pressure measurements for evaluation of peripheral arterial disease. Bilateral: RT BKA                            Left ABI: 0.971. Right BKA2. Normal pulse volume recording (PVR) at rest in the left lower extremity. 3.  Normal toe pressure/s (>94mmHg) at rest in the left lower extremity suggesting adequate wound healing potential.  Conclusions: 1. Normal pulse volume recording (PVR) at rest left lower extremityand normal toe pressures  suggestive of good wound healing potential.2. Right BKA    INVASIVE CARDIOLOGY PROCEDURE    Result Date: 04/05/2021  -Coronary angiogram showed 15-20% mid RCA stenosis, mild- moderate In-Stent restenosis noted in proximal to mid LAD stents, mild coronary artery disease to left main coronary artery and left circumflex artery. Mild coronary artery disease noted in diagonals and obtuse marginal's. CTO right PL branch(small, ), and receiving collaterals from left circumflex.LVEDP 28-30 mm  of Hg -I recommend medical therapy -Evaluate for non cardiac chest pain . -LVEDP is 28-20mm of Hg. I recommend frequent hemodialysis and diuresis.    MYOCARDIAL PERFUSION COMPLETE    Result Date: 04/01/2021  .  Abnormal regadenoson SPECT 74mTc- Sestamibi myocardial perfusion study. .  Clinically and electrocardiographically negative regadenoson ECG portion of the study. .  Post-stress ejection fraction was 46 %. .  RestingECG: ECG is abnormal.  EKG reveals sinus rhythm with right bundle branch block. RADIOLOGY: A small partially reversible defect seen ant/ant-lat wall, question small area of ischemia. Mildly decreased wall motion at the same area, EF 46%. TID 1.10 CAC     ECG 12 LEAD    Result Date: 03/31/2021  Normal sinus rhythm Possible Left atrial enlargement Right bundle branch block T wave abnormality, consider inferolateral ischemia Abnormal ECG When compared with ECG of 27-Feb-2021 21:18, Sinus rhythm has replaced Junctional rhythm  Confirmed by Maureen Ralphs ([id number]) on 03/31/2021 11:50:06 AM    ECG 12 LEAD    Result Date: 04/05/2021  Normal sinus rhythm Right bundle branch block , plus right ventricular hypertrophy Nonspecific ST and T wave abnormality Abnormal ECG Confirmed by Lissa Hoard, Maheswara ([id number]) on 04/05/2021 5:05:03 PM    ECG 12 LEAD    Result Date: 04/04/2021  Normal sinus rhythm Right bundle branch block Abnormal ECG  Confirmed by Nadyne Coombes ([id number]) on 04/04/2021 4:15:44 PM    ECG 12 LEAD    Result Date:  04/04/2021  Normal sinus rhythm Possible Left atrial enlargement Right bundle branch block T wave abnormality, consider lateral ischemia Abnormal ECG  Confirmed by Nadyne Coombes ([id number]) on 04/04/2021 3:14:01 PM    ECG 12 LEAD- TIME WITH TROPONINS    Result Date: 03/31/2021  Normal sinus rhythm Left atrial enlargement Right bundle branch block  Confirmed by Nanci Pina, Kristopher ([id number]) on 03/31/2021 6:48:28 PM    ECG 12 LEAD- TIME WITH TROPONINS    Result Date: 03/30/2021  Normal sinus rhythm Right bundle branch block Abnormal ECG NO STEMI REVIEWED BY DR. Beverely Pace  [id number]  Confirmed by Beverely Pace ([id number]), editor [place] Crane, Uncertain [phone]) on 03/30/2021 6:09:27 PM    ECG 12 LEAD- TIME WITH TROPONINS    Result Date: 03/30/2021  Normal sinus rhythm Right bundle branch block T wave abnormality, consider lateral ischemia Abnormal ECG NO STEMI REVIEWED BY DR. Beverely Pace  [id number]  Confirmed by Beverely Pace ([id number]), editor [place] Crane, Rachael [phone]) on 03/30/2021 4:08:20 PM        Discharge Medications:     Current Discharge Medication List      START taking these medications.      Details   * collagenase ointment 250 unit/gram Ointment  Commonly known as: SANTYL   Apply Topically, DAILY  Refills: 0     enoxaparin 80 mg/0.8 mL Syringe  Commonly known as: LOVENOX   80  mg, Subcutaneous, EVERY 24 HOURS, For "bridging" until INR is therapeutic  Qty: 7 Each  Refills: 0     magnesium oxide 400 mg Tablet  Commonly known as: MAG-OX   400 mg, Oral, 2 TIMES DAILY  Refills: 0     Pharmacy to Dose Warfarin   Pharmacy to dose  Refills: 0         * This list has 1 medication(s) that are the same as other medications prescribed for you. Read the directions carefully, and ask your doctor or other care provider to review them with you.            CONTINUE these medications which have CHANGED during your visit.      Details   oxyCODONE 5 mg Tablet  Commonly known as: ROXICODONE  What changed: how much to take   7.5 mg, Oral, EVERY 6 HOURS PRN  Qty:  21 Tablet  Refills: 0     * Warfarin 3 mg Tablet  Commonly known as: COUMADIN  What changed: Another medication with the same name was added. Make sure you understand how and when to take each.   3 mg, Oral, DAILY  Refills: 0     * Warfarin 6 mg Tablet  Commonly known as: COUMADIN  What changed: You were already taking a medication with the same name, and this prescription was added. Make sure you understand how and when to take each.   6 mg, Oral, NOW  Qty: 1 Tablet  Refills: 0         * This list has 2 medication(s) that are the same as other medications prescribed for you. Read the directions carefully, and ask your doctor or other care provider to review them with you.            CONTINUE these medications - NO CHANGES were made during your visit.      Details   aspirin 81 mg Tablet, Delayed Release (E.C.)  Commonly known as: ECOTRIN   81 mg, Oral, DAILY  Refills: 0     atorvastatin 80 mg Tablet  Commonly known as: LIPITOR   80 mg, Oral, EVERY EVENING  Refills: 0     bacitracin 500 unit/gram Packet   1 Packet, Topical, 2 TIMES DAILY PRN  Refills: 0     carvediloL 25 mg Tablet  Commonly known as: COREG   25 mg, Oral, 2 TIMES DAILY WITH FOOD  Refills: 0     cyclobenzaprine 5 mg Tablet  Commonly known as: FLEXERIL   5 mg, Oral, 3 TIMES DAILY PRN  Refills: 0     docusate sodium 100 mg Capsule  Commonly known as: COLACE   100 mg, Oral, 2 TIMES DAILY  Refills: 0     DULoxetine 60 mg Capsule, Delayed Release(E.C.)  Commonly known as: CYMBALTA DR   60 mg, Oral, DAILY  Refills: 0     esomeprazole magnesium 20 mg Capsule, Delayed Release(E.C.)  Commonly known as: NEXIUM   20 mg, Oral, EVERY MORNING BEFORE BREAKFAST  Refills: 0     ezetimibe 10 mg Tablet  Commonly known as: ZETIA   10 mg, Oral, EVERY EVENING  Refills: 0     ferrous sulfate 324 mg (65 mg iron) Tablet, Delayed Release (E.C.)  Commonly known as: FERATAB   324 mg, Oral  Refills: 0     gabapentin 400 mg Capsule  Commonly known as: NEURONTIN   400 mg, Oral, 3  TIMES DAILY  Refills: 0  insulin glargine-yfgn 100 unit/mL   8 Units, Subcutaneous, DAILY  Refills: 0     insulin lispro 100 units/mL Injectable   0-12 Units, Subcutaneous, EVERY 4 HOURS PRN  Refills: 0     isosorbide mononitrate 60 mg Tablet Sustained Release 24 hr  Commonly known as: IMDUR   60 mg, Oral, EVERY MORNING  Refills: 0     lidocaine 5 % Adhesive Patch, Medicated  Commonly known as: LIDODERM   700 mg, Transdermal, DAILY  Refills: 0     Lokelma 10 gram Powder in Packet  Generic drug: sodium zirconium cyclosilicate   10 g, Oral, DAILY  Refills: 0     polyethylene glycol 17 gram Powder in Packet  Commonly known as: MIRALAX   17 g, Oral, DAILY  Refills: 0     ranolazine 500 mg Tablet Sustained Release 12 hr  Commonly known as: RANEXA   500 mg, Oral, 2 TIMES DAILY  Refills: 0     sevelamer 800 mg Tablet  Commonly known as: RENAGEL   800 mg, Oral, 3 TIMES DAILY WITH MEALS  Refills: 0     sucralfate 1 gram Tablet  Commonly known as: CARAFATE   1 g, Oral, 3 TIMES DAILY BEFORE MEALS  Refills: 0     torsemide 20 mg Tablet  Commonly known as: DEMADEX   20 mg, Oral, 2 TIMES DAILY  Refills: 0        STOP taking these medications.    amLODIPine 10 mg Tablet  Commonly known as: [place] your doctor about these medications.      Details   * collagenase ointment 250 unit/gram Ointment  Commonly known as: SANTYL  Ask about: Should I take this medication?   Apply Topically, DAILY  Refills: 0         * This list has 1 medication(s) that are the same as other medications prescribed for you. Read the directions carefully, and ask your doctor or other care provider to review them with you.                  Discharge Instructions:     DISCHARGE INSTRUCTION - DIET     Diet: RESUME HOME DIET      DISCHARGE INSTRUCTION - ACTIVITY AS TOLERATED     Activity: AS TOLERATED      DISCHARGE INSTRUCTION - MISC    1. Lovenox bridging until INR is therapeutic  2. Follow-up wound care regarding right leg stump  3. Continue  dialysis-consider Procrit with dialysis  4. Follow-up with dialysis as scheduled and with Nephrology         D/C CONDITION: stable  RISK: Pt is at increased risk for decompensation & readmission d/t multiple comorbidities.  TIME OF DISCHARGE: 58min        CC to the following:   Follow-up Information     Azzie Roup, MD Follow up in 3 day(s).    Specialty: HOSPITALIST-INTERNAL MEDICINE  Why: Office is closed , please call next business day.  Contact information:  [address]  [address]  [place] [address]  [phone]                               Zane Herald, MD    Time spent on discharge greater than 30 minutes? Yes      Copies sent to Care Team  Relationship Specialty Notifications Start End    Azzie Roup, MD PCP - General HOSPITALIST-INTERNAL MEDICINE Admissions 02/27/21     Phone: [phone] Fax: [phone]         [address] [place] [address]          Referring providers can utilize [email or web address] to access their referred [place] patient's information.

## 2021-04-09 ENCOUNTER — Encounter (INDEPENDENT_AMBULATORY_CARE_PROVIDER_SITE_OTHER): Payer: Self-pay | Admitting: Cardiovascular Disease

## 2021-04-10 ENCOUNTER — Other Ambulatory Visit: Payer: Medicare (Managed Care) | Attending: Internal Medicine | Admitting: Internal Medicine

## 2021-04-10 ENCOUNTER — Other Ambulatory Visit (HOSPITAL_COMMUNITY): Payer: Self-pay | Admitting: Internal Medicine

## 2021-04-10 DIAGNOSIS — N186 End stage renal disease: Secondary | ICD-10-CM | POA: Insufficient documentation

## 2021-04-10 DIAGNOSIS — I513 Intracardiac thrombosis, not elsewhere classified: Secondary | ICD-10-CM | POA: Insufficient documentation

## 2021-04-10 LAB — BASIC METABOLIC PANEL
ANION GAP: 9 mmol/L (ref 4–13)
BUN/CREA RATIO: 5 — ABNORMAL LOW (ref 6–22)
BUN: 17 mg/dL (ref 8–25)
CALCIUM: 9 mg/dL (ref 8.5–10.0)
CHLORIDE: 98 mmol/L (ref 96–111)
CO2 TOTAL: 28 mmol/L (ref 22–30)
CREATININE: 3.56 mg/dL — ABNORMAL HIGH (ref 0.75–1.35)
ESTIMATED GFR: 20 mL/min/BSA — ABNORMAL LOW (ref >=60)
GLUCOSE: 184 mg/dL — ABNORMAL HIGH (ref 65–125)
POTASSIUM: 3.8 mmol/L (ref 3.5–5.1)
SODIUM: 135 mmol/L — ABNORMAL LOW (ref 136–145)

## 2021-04-10 LAB — PT/INR
INR: 1.51 (ref 0.00–3.50)
PROTHROMBIN TIME: 14.7 seconds — ABNORMAL HIGH (ref 10.0–11.0)

## 2021-04-16 ENCOUNTER — Other Ambulatory Visit: Payer: Medicare (Managed Care) | Attending: Internal Medicine | Admitting: Internal Medicine

## 2021-04-16 ENCOUNTER — Other Ambulatory Visit (HOSPITAL_COMMUNITY): Payer: Self-pay | Admitting: Internal Medicine

## 2021-04-16 DIAGNOSIS — E119 Type 2 diabetes mellitus without complications: Secondary | ICD-10-CM | POA: Insufficient documentation

## 2021-04-16 DIAGNOSIS — I482 Chronic atrial fibrillation, unspecified: Secondary | ICD-10-CM | POA: Insufficient documentation

## 2021-04-16 LAB — BASIC METABOLIC PANEL
ANION GAP: 9 mmol/L (ref 4–13)
BUN/CREA RATIO: 3 — ABNORMAL LOW (ref 6–22)
BUN: 8 mg/dL (ref 8–25)
CALCIUM: 9.1 mg/dL (ref 8.5–10.0)
CHLORIDE: 98 mmol/L (ref 96–111)
CO2 TOTAL: 27 mmol/L (ref 22–30)
CREATININE: 2.68 mg/dL — ABNORMAL HIGH (ref 0.75–1.35)
ESTIMATED GFR: 28 mL/min/BSA — ABNORMAL LOW (ref >=60)
GLUCOSE: 173 mg/dL — ABNORMAL HIGH (ref 65–125)
POTASSIUM: 3.6 mmol/L (ref 3.5–5.1)
SODIUM: 134 mmol/L — ABNORMAL LOW (ref 136–145)

## 2021-04-16 LAB — PT/INR
INR: 2.4 (ref 0.00–3.50)
PROTHROMBIN TIME: 22.7 seconds — ABNORMAL HIGH (ref 10.0–11.0)

## 2021-04-19 ENCOUNTER — Encounter (INDEPENDENT_AMBULATORY_CARE_PROVIDER_SITE_OTHER): Payer: Medicare (Managed Care) | Admitting: Cardiovascular Disease

## 2021-04-23 ENCOUNTER — Other Ambulatory Visit: Payer: Self-pay

## 2021-04-23 ENCOUNTER — Other Ambulatory Visit: Payer: Medicare (Managed Care) | Attending: Internal Medicine | Admitting: Internal Medicine

## 2021-04-23 ENCOUNTER — Other Ambulatory Visit (HOSPITAL_COMMUNITY): Payer: Self-pay | Admitting: Internal Medicine

## 2021-04-23 DIAGNOSIS — N183 Chronic kidney disease, stage 3 unspecified: Secondary | ICD-10-CM | POA: Insufficient documentation

## 2021-04-23 LAB — BASIC METABOLIC PANEL
ANION GAP: 9 mmol/L (ref 4–13)
BUN/CREA RATIO: 4 — ABNORMAL LOW (ref 6–22)
BUN: 8 mg/dL (ref 8–25)
CALCIUM: 8.8 mg/dL (ref 8.5–10.0)
CHLORIDE: 96 mmol/L (ref 96–111)
CO2 TOTAL: 30 mmol/L (ref 22–30)
CREATININE: 2.27 mg/dL — ABNORMAL HIGH (ref 0.75–1.35)
ESTIMATED GFR: 35 mL/min/BSA — ABNORMAL LOW (ref >=60)
GLUCOSE: 116 mg/dL (ref 65–125)
POTASSIUM: 3.6 mmol/L (ref 3.5–5.1)
SODIUM: 135 mmol/L — ABNORMAL LOW (ref 136–145)

## 2021-04-23 LAB — PT/INR
INR: 3.53 — ABNORMAL HIGH (ref 0.00–3.50)
PROTHROMBIN TIME: 32.5 seconds — ABNORMAL HIGH (ref 10.0–11.0)

## 2021-05-09 ENCOUNTER — Other Ambulatory Visit (HOSPITAL_COMMUNITY): Payer: Self-pay | Admitting: Internal Medicine

## 2021-05-09 ENCOUNTER — Other Ambulatory Visit: Payer: Medicare (Managed Care) | Attending: Internal Medicine | Admitting: Internal Medicine

## 2021-05-09 DIAGNOSIS — N390 Urinary tract infection, site not specified: Secondary | ICD-10-CM | POA: Insufficient documentation

## 2021-05-09 LAB — URINALYSIS, MACRO/MICRO
BILIRUBIN: NEGATIVE mg/dL
BLOOD: NEGATIVE mg/dL
GLUCOSE: NEGATIVE mg/dL
KETONES: NEGATIVE mg/dL
LEUKOCYTES: NEGATIVE WBCs/uL
NITRITE: NEGATIVE
PH: 7 (ref 5.0–8.5)
PROTEIN: 100 mg/dL — AB
SPECIFIC GRAVITY: 1.015 (ref 1.005–1.020)
UROBILINOGEN: 0.2 mg/dL

## 2021-05-11 LAB — URINE CULTURE: URINE CULTURE: NO GROWTH

## 2021-05-17 ENCOUNTER — Telehealth (INDEPENDENT_AMBULATORY_CARE_PROVIDER_SITE_OTHER): Payer: Self-pay | Admitting: Specialist

## 2021-05-17 NOTE — Telephone Encounter (Signed)
Called [facility], spoke to [place] to remind of upcoming appointment in vascular surgery office. Facility is aware of and intends to keep the appointment.       Providence Crosby, CMA

## 2021-05-18 ENCOUNTER — Other Ambulatory Visit: Payer: Medicare (Managed Care) | Attending: Internal Medicine | Admitting: Internal Medicine

## 2021-05-18 ENCOUNTER — Other Ambulatory Visit: Payer: Self-pay

## 2021-05-18 ENCOUNTER — Other Ambulatory Visit (HOSPITAL_COMMUNITY): Payer: Self-pay | Admitting: Internal Medicine

## 2021-05-18 ENCOUNTER — Encounter (INDEPENDENT_AMBULATORY_CARE_PROVIDER_SITE_OTHER): Payer: Self-pay | Admitting: Specialist

## 2021-05-18 ENCOUNTER — Ambulatory Visit (INDEPENDENT_AMBULATORY_CARE_PROVIDER_SITE_OTHER): Payer: Medicare (Managed Care) | Admitting: Specialist

## 2021-05-18 ENCOUNTER — Encounter (HOSPITAL_COMMUNITY): Payer: Self-pay

## 2021-05-18 ENCOUNTER — Ambulatory Visit: Payer: Medicare (Managed Care) | Attending: Nephrology

## 2021-05-18 VITALS — BP 158/98 | Ht 68.0 in | Wt 165.0 lb

## 2021-05-18 DIAGNOSIS — N186 End stage renal disease: Secondary | ICD-10-CM | POA: Insufficient documentation

## 2021-05-18 DIAGNOSIS — Z89511 Acquired absence of right leg below knee: Secondary | ICD-10-CM

## 2021-05-18 DIAGNOSIS — Z992 Dependence on renal dialysis: Secondary | ICD-10-CM

## 2021-05-18 DIAGNOSIS — Z4901 Encounter for fitting and adjustment of extracorporeal dialysis catheter: Secondary | ICD-10-CM

## 2021-05-18 DIAGNOSIS — E119 Type 2 diabetes mellitus without complications: Secondary | ICD-10-CM | POA: Insufficient documentation

## 2021-05-18 DIAGNOSIS — I770 Arteriovenous fistula, acquired: Secondary | ICD-10-CM

## 2021-05-18 LAB — PT/INR
INR: 2.02 (ref 0.00–3.50)
PROTHROMBIN TIME: 19.3 seconds — ABNORMAL HIGH (ref 10.0–11.0)

## 2021-05-18 NOTE — Progress Notes (Signed)
[place]  [place] [id number]    Name: Max [name]. MRN:  [id number]   Date: 05/18/2021 Age: 51 y.o.     Referring Provider: [facility]    Chief Complaint: Other (Discuss permanent dialysis ) and Follow-up After Testing (Vein mapping today)    History of Present Illness:   Patient is a very nice and pleasant 51 year old gentleman with multiple serious medical problems including longstanding insulin-dependent diabetes for more than 30 years, diabetic nephropathy, diabetic neuropathy, end-stage renal failure on hemodialysis via a right jugular Ash catheter placed at an outside hospital 4 months ago. He has been on dialysis for the past 4 months. He has a history significant for serious coronary artery disease with multiple myocardial infarctions in the past, multiple percutaneous coronary angioplasty and stenting in the past. Patient states he has 12 heart stents in total. His last myocardial infarction was 4 months ago at an outside [facility]) hospital at the time of his right below-knee amputation. He also has a history of bilateral great toe amputation, one year ago on the right and 2 years ago on the left, and a history of DVT after foot surgery in the remote past. He was previously treated with Coumadin anticoagulation which he is now no longer taking.    Patient has a history of cardiomyopathy with diminished ejection fraction with his most recent echo showing an ejection fraction of 55%. He currently resides at [facility].    Patient is here today for evaluation for permanent AV access. He denies history of upper extremity DVT, phlebitis, pulmonary embolism or other hypercoagulability. He has had a left jugular dialysis catheter in the past which is no longer in place and currently has a right internal jugular vein tunneled cuff permacath which is functioning well.    Patient denies history of stroke, TIA or  amaurosis. He denies history of smoking, alcohol or drug use. He has not been fitted for a prosthetic for his right below-knee amputation.     Patient denies current chest pain, back pain, abdominal pain, shortness of breath, nausea, vomiting, fever, cough, throat pain or COVID-19 symptoms.    Past Surgical History:   Procedure Laterality Date   . AMPUTATION FOOT / TOE Left    . CORONARY ARTERY ANGIOPLASTY      12 stents placed   . HX CATARACT REMOVAL Bilateral    . LEG AMPUTATION Right 02/2021    Below the knee amputation         Allergies   Allergen Reactions   . Toradol [Ketorolac] Rash   . Ultram [Tramadol] Rash   . Benadryl [Diphenhydramine]  Other Adverse Reaction (Add comment)     irritable   . Penicillin G Benethamine      Patient Active Problem List    Diagnosis   . Hypomagnesemia   . Chest pain at rest   . End stage renal disease on dialysis (CMS HCC)   . Cardiovascular disease   . History of right below knee amputation (CMS HCC)   . ACS (acute coronary syndrome) (CMS HCC)   . Hyperkalemia   . Anemia   . Chest pain     Past Medical History:   Diagnosis Date   . Arthropathy    . CKD (chronic kidney disease), stage III (CMS HCC) 08/25/2019   . Congestive heart failure (CMS HCC)    . Coronary artery disease    .  Diabetes mellitus, type 2 (CMS HCC)    . Headache    . Heart attack (CMS [place]) 2020   . Heart attack (CMS [place]) 2021   . Heart attack (CMS [place]) 09/2020   . HTN (hypertension)    . Hx MRSA infection          Social History     Tobacco Use   . Smoking status: Never   . Smokeless tobacco: Never   Substance Use Topics   . Alcohol use: Never      Current Outpatient Medications   Medication Sig   . aspirin (ECOTRIN) 81 mg Oral Tablet, Delayed Release (E.C.) Take 1 Tablet (81 mg total) by mouth Once a day   . atorvastatin (LIPITOR) 80 mg Oral Tablet Take 1 Tablet (80 mg total) by mouth Every evening   . bacitracin 500 unit/gram Packet Apply 1 Packet topically Twice per day as needed for Other (Patient not  taking: Reported on 05/18/2021)   . carvediloL (COREG) 25 mg Oral Tablet Take 1 Tablet (25 mg total) by mouth Twice daily with food   . cyclobenzaprine (FLEXERIL) 5 mg Oral Tablet Take 1 Tablet (5 mg total) by mouth Three times a day as needed for Muscle spasms   . docusate sodium (COLACE) 100 mg Oral Capsule Take 1 Capsule (100 mg total) by mouth Twice daily (Patient not taking: Reported on 05/18/2021)   . DULoxetine (CYMBALTA DR) 60 mg Oral Capsule, Delayed Release(E.C.) Take 1 Capsule (60 mg total) by mouth Once a day (Patient not taking: Reported on 05/18/2021)   . enoxaparin (LOVENOX) 80 mg/0.8 mL Subcutaneous Syringe Inject 0.8 mL (80 mg total) under the skin Every 24 hours For "bridging" until INR is therapeutic (Patient not taking: Reported on 05/18/2021)   . esomeprazole magnesium (NEXIUM) 20 mg Oral Capsule, Delayed Release(E.C.) Take 1 Capsule (20 mg total) by mouth Every morning before breakfast   . ezetimibe (ZETIA) 10 mg Oral Tablet Take 1 Tablet (10 mg total) by mouth Every evening   . ferrous sulfate (FERATAB) 324 mg (65 mg iron) Oral Tablet, Delayed Release (E.C.) Take 1 Tablet (324 mg total) by mouth (Patient not taking: Reported on 05/18/2021)   . gabapentin (NEURONTIN) 400 mg Oral Capsule Take 1 Capsule (400 mg total) by mouth Three times a day   . insulin glargine-yfgn 100 unit/mL Subcutaneous Inject 8 Units under the skin Once a day (Patient not taking: Reported on 05/18/2021)   . insulin lispro 100 units/mL Subcutaneous Injectable Inject 0-12 Units under the skin Every 4 hours as needed for Other   . isosorbide mononitrate (IMDUR) 60 mg Oral Tablet Sustained Release 24 hr Take 1 Tablet (60 mg total) by mouth Every morning   . lidocaine (LIDODERM) 5 % Adhesive Patch, Medicated Place 1 Patch (700 mg total) on the skin Once a day   . magnesium oxide (MAG-OX) 400 mg Oral Tablet Take 1 Tablet (400 mg total) by mouth Twice daily   . oxyCODONE (OXY IR) 5 mg Oral Capsule Take 1 Capsule (5 mg total) by mouth Every  8 hours as needed for Pain   . Pharmacy to Dose Warfarin Pharmacy to dose   . polyethylene glycol (MIRALAX) 17 gram Oral Powder in Packet Take 1 Packet (17 g total) by mouth Once a day   . ranolazine (RANEXA) 500 mg Oral Tablet Sustained Release 12 hr Take 1 Tablet (500 mg total) by mouth Twice daily   . sevelamer (RENAGEL) 800 mg Oral Tablet Take  1 Tablet (800 mg total) by mouth Three times daily with meals   . sodium zirconium cyclosilicate (LOKELMA) 10 gram Oral Powder in Packet Take 1 Packet (10 g total) by mouth Once a day   . sucralfate (CARAFATE) 1 gram Oral Tablet Take 1 Tablet (1 g total) by mouth Three times daily before meals   . torsemide (DEMADEX) 20 mg Oral Tablet Take 1 Tablet (20 mg total) by mouth Twice daily   . Warfarin (COUMADIN) 3 mg Oral Tablet Take 1 Tablet (3 mg total) by mouth Once a day     Family Medical History:     Problem Relation (Age of Onset)    Cancer Father    Coronary Artery Disease Mother, Father          Review of Systems:  10+ systems review was performed and is negative except as noted in the HPI.     Physical Examination:  BP (!) 158/98 (Site: Manual, Patient Position: Sitting, Cuff Size: Adult) Comment: left arm Comment (Site): right arm 160/100  Ht 1.727 m (5\' 8" )   Wt 74.8 kg (165 lb)   BMI 25.09 kg/m   GENERAL:  The patient is alert and oriented, does not appear to be in any acute distress. He is wheelchair bound, but able to stand on his own with good upper body strength and left leg strength.  HEENT:  Pupils are equal, round and reactive.  Extraocular movements are intact.  NEUROLOGICALLY:  Cranial nerves II-XII are grossly intact.  No motor sensory deficits.  HEART:  Regular rate and rhythm.  No murmurs present.  LUNGS:  Clear to auscultation bilaterally.  ABDOMEN:  Soft, nontender and nondistended.   NECK:  No bruits noted over the carotids.  EXTREMITIES:  Well-healed right below-knee amputation, intact. No dehiscence, breakdown, erythema or infection. Good knee  straightening, range of motion and strength of both lower extremities. There is no palpable pedal pulse in the left foot. Healed left great toe amputation without digital cyanosis, ischemia or emboli. No phlegmasia is seen. He has palpable radial artery pulses in both wrists with scarring throughout both forearms and decubitus from multiple previous IV access and blood draw procedures presumably. No significant edema.    DATA REVIEWED:  Patient's vein mapping study which was done today in our vascular lab, 05/18/21, showed adequate cephalic and basilic vein in both upper extremities, better in the upper arm than the forearms bilaterally.    DIAGNOSIS:  N18.6, ESRD    IMPRESSION & PLAN:  End-stage renal failure, on hemodialysis via right jugular cuffed tunnel permacath, in need of permanent AV access. He is a right-handed gentleman and I can offer him left upper extremity exploration with possible cephalic or basilic vein AV fistula at the antecubitus versus AV graft placement. He appeared to understand our discussion and agrees with this plan.    I will also arrange appropriate prosthetic fitting for his right below-knee amputation site.     Patient states he no longer follows up with a cardiologist. Given his extensive cardiac history and recent myocardial infarction, I will obtain cardiac risk evaluation prior to AV access surgery.    Patient is in agreement. I will keep you updated.      I personally performed the services described in this documentation, as scribed  in my presence, and it is both accurate  and complete.    On the day of the encounter, a total of  39 minutes was spent on this patient encounter  including review of historical information, examination, documentation and post-visit activities.             Aquilla Solian, MD      I am scribing for, and in the presence of, Dr. Aquilla Solian, for services provided on 05/18/2021.  Terri L. [employer]

## 2021-05-19 ENCOUNTER — Emergency Department
Admission: EM | Admit: 2021-05-19 | Discharge: 2021-05-19 | Disposition: A | Discharge status: Home / Self Care | Payer: Medicare (Managed Care) | Attending: Student in an Organized Health Care Education/Training Program | Admitting: Student in an Organized Health Care Education/Training Program

## 2021-05-19 ENCOUNTER — Emergency Department (HOSPITAL_COMMUNITY): Payer: Medicare (Managed Care)

## 2021-05-19 ENCOUNTER — Other Ambulatory Visit: Payer: Self-pay

## 2021-05-19 DIAGNOSIS — Z992 Dependence on renal dialysis: Secondary | ICD-10-CM

## 2021-05-19 DIAGNOSIS — I2510 Atherosclerotic heart disease of native coronary artery without angina pectoris: Secondary | ICD-10-CM

## 2021-05-19 DIAGNOSIS — E1122 Type 2 diabetes mellitus with diabetic chronic kidney disease: Secondary | ICD-10-CM | POA: Insufficient documentation

## 2021-05-19 DIAGNOSIS — N183 Chronic kidney disease, stage 3 unspecified (CMS HCC): Secondary | ICD-10-CM

## 2021-05-19 DIAGNOSIS — I4510 Unspecified right bundle-branch block: Secondary | ICD-10-CM

## 2021-05-19 DIAGNOSIS — I252 Old myocardial infarction: Secondary | ICD-10-CM

## 2021-05-19 DIAGNOSIS — Z955 Presence of coronary angioplasty implant and graft: Secondary | ICD-10-CM

## 2021-05-19 DIAGNOSIS — R079 Chest pain, unspecified: Secondary | ICD-10-CM

## 2021-05-19 DIAGNOSIS — I130 Hypertensive heart and chronic kidney disease with heart failure and stage 1 through stage 4 chronic kidney disease, or unspecified chronic kidney disease: Secondary | ICD-10-CM | POA: Insufficient documentation

## 2021-05-19 LAB — TROPONIN-I
TROPONIN I: 25 ng/L (ref <=30)
TROPONIN I: 35 ng/L (ref <=30)
TROPONIN I: 29 ng/L (ref <=30)

## 2021-05-19 LAB — SCAN DIFFERENTIAL
PLATELET ESTIMATE: DECREASED
RBC MORPHOLOGY COMMENT: NORMAL
WBC MORPHOLOGY COMMENT: NORMAL

## 2021-05-19 LAB — HEPATIC FUNCTION PANEL
ALBUMIN: 3.3 g/dL — ABNORMAL LOW (ref 3.5–5.0)
ALKALINE PHOSPHATASE: 212 U/L — ABNORMAL HIGH (ref 45–115)
ALT (SGPT): 9 U/L — ABNORMAL LOW (ref 10–55)
AST (SGOT): 16 U/L (ref 8–45)
BILIRUBIN DIRECT: 0.3 mg/dL (ref 0.1–0.4)
BILIRUBIN TOTAL: 0.6 mg/dL (ref 0.3–1.3)
PROTEIN TOTAL: 8.7 g/dL — ABNORMAL HIGH (ref 6.4–8.3)

## 2021-05-19 LAB — BASIC METABOLIC PANEL
ANION GAP: 8 mmol/L (ref 4–13)
BUN/CREA RATIO: 6 (ref 6–22)
BUN: 14 mg/dL (ref 8–25)
CALCIUM: 9.1 mg/dL (ref 8.5–10.0)
CHLORIDE: 99 mmol/L (ref 96–111)
CO2 TOTAL: 28 mmol/L (ref 22–30)
CREATININE: 2.52 mg/dL — ABNORMAL HIGH (ref 0.75–1.35)
ESTIMATED GFR: 31 mL/min/BSA — ABNORMAL LOW (ref >=60)
GLUCOSE: 181 mg/dL — ABNORMAL HIGH (ref 65–125)
POTASSIUM: 3.8 mmol/L (ref 3.5–5.1)
SODIUM: 135 mmol/L — ABNORMAL LOW (ref 136–145)

## 2021-05-19 LAB — CBC WITH DIFF
BASOPHIL #: 0.1 10*3/uL (ref 0.00–0.30)
BASOPHIL %: 1 % (ref 0–3)
EOSINOPHIL #: 0.4 10*3/uL (ref 0.00–0.50)
EOSINOPHIL %: 6 % (ref 0–7)
HCT: 30.2 % — ABNORMAL LOW (ref 43.5–53.7)
HGB & HCT check: -0.2
HGB: 10 g/dL — ABNORMAL LOW (ref 14.1–18.1)
LYMPHOCYTE #: 1.5 10*3/uL (ref 0.90–4.80)
LYMPHOCYTE %: 23 % (ref 10–50)
MCH: 27.6 pg (ref 27.0–31.2)
MCHC: 33 g/dL (ref 31.8–35.4)
MCV: 83.8 fL (ref 80.0–97.0)
MONOCYTE #: 1.2 10*3/uL — ABNORMAL HIGH (ref 0.30–0.90)
MONOCYTE %: 19 % — ABNORMAL HIGH (ref 0–12)
NEUTROPHIL #: 3.3 10*3/uL (ref 1.70–7.00)
NEUTROPHIL %: 51 % (ref 37–80)
PLATELETS: 253 10*3/uL (ref 142–424)
RBC: 3.61 10*6/uL — ABNORMAL LOW (ref 4.69–6.13)
RDW: 17.6 % — ABNORMAL HIGH (ref 11.6–14.9)
WBC: 6.5 10*3/uL (ref 4.6–10.2)

## 2021-05-19 LAB — ECG 12 LEAD
Atrial Rate: 68 {beats}/min
Calculated P Axis: 27 degrees
Calculated R Axis: 116 degrees
PR Interval: 190 ms
QT Interval: 500 ms
QT Interval: 480 ms
QTC Calculation: 531 ms

## 2021-05-19 LAB — MAGNESIUM: MAGNESIUM: 1.8 mg/dL (ref 1.8–2.6)

## 2021-05-19 LAB — PT/INR
INR: 1.99 (ref 0.00–3.50)
PROTHROMBIN TIME: 19 seconds — ABNORMAL HIGH (ref 10.0–11.0)

## 2021-05-19 LAB — THYROID STIMULATING HORMONE WITH FREE T4 REFLEX: TSH: 2.101 u[IU]/mL (ref 0.430–3.550)

## 2021-05-19 MED ORDER — GABAPENTIN 400 MG CAPSULE
400.0000 mg | ORAL_CAPSULE | ORAL | Status: AC
Start: 2021-05-19 — End: 2021-05-19
  Administered 2021-05-19: 400 mg via ORAL
  Filled 2021-05-19: qty 1

## 2021-05-19 MED ORDER — ASPIRIN 81 MG CHEWABLE TABLET
324.0000 mg | CHEWABLE_TABLET | ORAL | Status: AC
Start: 2021-05-19 — End: 2021-05-19
  Administered 2021-05-19: 243 mg via ORAL
  Filled 2021-05-19: qty 3

## 2021-05-19 MED ORDER — SODIUM CHLORIDE 0.9 % IV BOLUS
500.0000 mL | INJECTION | Status: AC
Start: 2021-05-19 — End: 2021-05-19
  Administered 2021-05-19: 500 mL via INTRAVENOUS
  Administered 2021-05-19: 0 mL via INTRAVENOUS

## 2021-05-19 MED ORDER — OXYCODONE 5 MG TABLET
5.0000 mg | ORAL_TABLET | ORAL | Status: AC
Start: 2021-05-19 — End: 2021-05-19
  Administered 2021-05-19: 5 mg via ORAL
  Filled 2021-05-19: qty 1

## 2021-05-19 NOTE — ED Nurses Note (Signed)
Pt provided with dinner tray and has been sitting comfortably in bed, [facility] states that the transport Lucianne Lei has not arrived back from [place] yet

## 2021-05-19 NOTE — ED Nurses Note (Signed)
Patient provided with two warm blankets for comfort.

## 2021-05-19 NOTE — ED Triage Notes (Signed)
Patient presented to ed via ems from dialysis for chest pain, lethargy, and diarrhea that began when he was at dialysis. Patient stated he was having midsternal chest pain radiating to left neck and left arm with left arm numbness.

## 2021-05-19 NOTE — Ancillary Notes (Signed)
Dr August Albino speaking with Dr. Wyline Copas

## 2021-05-19 NOTE — Discharge Instructions (Signed)
Continue all medications as prescribed.  Follow-up with cardiology and PCP in the next 2-3 days for recheck.  Seek medical attention if symptoms return or worsen.

## 2021-05-19 NOTE — ED Nurses Note (Signed)
Pt has refused final ECG Physician notified

## 2021-05-19 NOTE — ED Nurses Note (Signed)
Attempted call to [facility] a 2nd time with no success

## 2021-05-19 NOTE — ED Nurses Note (Signed)
Pt asleep in bed at this time.

## 2021-05-19 NOTE — ED Nurses Note (Signed)
Attempted to call report to [facility] with no success

## 2021-05-19 NOTE — Ancillary Notes (Signed)
BLS TONES TO [place] RN REPORTED [facility] AT [facility] DECLINED.  1928 - [name] - PENDING A CALL BACK  1932 - I CANCEL BECAUSE THE NEW [facility] WAS ABLE TO SEND THERE TRUCK TO PICK UP THE PATIENT.

## 2021-05-19 NOTE — ED Nurses Note (Signed)
Report called to amy RN

## 2021-05-19 NOTE — ED Nurses Note (Signed)
Call placed to [facility] facility to inquire about transportation, The facility representative stated that the transport Lucianne Lei is still in [place] and that they are unaware of when it will be back.

## 2021-05-19 NOTE — ED Nurses Note (Signed)
Put patient on bedpan. 

## 2021-05-19 NOTE — ED Nurses Note (Signed)
Attempted to call [facility] about transport van ETA, no answer

## 2021-05-19 NOTE — Ancillary Notes (Signed)
Paged WPM Dr. Wyline Copas for consult with Dr. August Albino

## 2021-05-19 NOTE — ED Provider Notes (Signed)
Max [name].   [birth year]   51 y.o.   male     Chief Complaint:   Chief Complaint   Patient presents with   . Chest Pain    . Lethargy   . Diarrhea        HPI: This is a 51 y.o. male who presents to the emergency department via EMS from dialysis with chief complain of chest pain.  Patient had received 3-1/2 hours of his 4 hour dialysis treatment when he developed worsening lethargy and chest pain.  Patient admits to radiation to his neck, left shoulder, left arm with some numbness and tingling of the left arm as well, some shortness of breath, nausea, vomiting, and developed diarrhea this morning.  Patient does have a history of CAD and has 12 stents with history of 3 MIs.  ?   ?   ?   Past Medical History:   Past Medical History:   Diagnosis Date   . Arthropathy    . CKD (chronic kidney disease), stage III (CMS HCC) 08/25/2019   . Congestive heart failure (CMS HCC)    . Coronary artery disease    . Diabetes mellitus, type 2 (CMS HCC)    . Headache    . Heart attack (CMS [place]) 2020   . Heart attack (CMS [place]) 2021   . Heart attack (CMS [place]) 09/2020   . HTN (hypertension)    . Hx MRSA infection         Past Surgical History:   Past Surgical History:   Procedure Laterality Date   . Amputation foot / toe Left    . Coronary artery angioplasty     . Hx cataract removal Bilateral    . Leg amputation Right 02/2021        Social History:   Social History     Tobacco Use   . Smoking status: Never   . Smokeless tobacco: Never   Vaping Use   . Vaping Use: Never used   Substance Use Topics   . Alcohol use: Never   . Drug use: Never      Social History     Substance and Sexual Activity   Drug Use Never         Allergies:   Allergies   Allergen Reactions   . Toradol [Ketorolac] Rash   . Ultram [Tramadol] Rash   . Benadryl [Diphenhydramine]  Other Adverse Reaction (Add comment)     irritable   . Penicillin G Benethamine       ?     Review of Systems: Other than pertinent positives discussed in HPI, all other systems reviewed and  are negative.  ?     Physical Exam:     General: Patient alert and oriented x4. No acute distress. BP (!) 161/99 Comment: physician notified  Pulse 72   Temp 35.9 C (96.7 F)   Resp 16   Wt 77.1 kg (170 lb)   SpO2 92%   BMI 25.85 kg/m          HEENT: Head: Normocephalic, atraumatic. Eyes: Normal conjunctiva. Oral mucosa moist.  CV: Regular rate and rhythm without murmurs, rubs, gallops  Respiratory: No respiratory distress noted.  Diminished air movement in all lung lobes without crackles, wheeze, rhonchi  GI:  Normal bowel sounds, soft, nontender, nondistended  MS: Appropriate ROM. Normal Strength.  Right BKA  Neuro: Alert and oriented x4. No focal motor or sensory deficits. GCS 15.  Skin: Warm and  dry. No rashes or lesions appreciated.  Capillary refill 3 seconds.  ?   ?   ?   Medical Decision Making: Patient was triaged, vital signs were obtained, patient was placed in a room. I did examine the patient.  Vitals were stable upon arrival.  After examining the patient concern for dehydration, mi, angina, other.  EKG showed sinus rhythm rate of 60 with a right bundle-branch block and nonspecific T-waves especially in the inferior lateral leads.  Patient was given 243 mg of chewable aspirin due to taking his previous aspirin this morning.  He was also ordered 500 cc normal saline.  Lab work showed improved cell counts, electrolytes, improving kidney function, thyroid, troponin of 25 which is in normal range, and stable liver test.  Chest x-ray read showed chronic changes but no acute abnormalities.  Patient did request his oxycodone and was given 1 dose while in the ED. repeat EKG showed sinus rhythm with a right bundle-branch block with continued nonspecific T-waves but no ST elevations or changes.  Repeat troponin was 35.  Patient stated his chest pain had resolved.  Patient was much more awake after taking a nap as well.  Case discussed with Dr. Earlie Counts, cardiology at [facility] who stated that he considered a  minimal increase as long as the patient was chest pain-free he could be good to go home.  Findings and plan were discussed with patient.  Patient stated he felt much better on would rather go home.  Return precautions all follow-up with his primary care doctor and cardiologist were discussed.  He expressed understanding of plan all questions were answered to satisfaction.?   ?   ?     ?   Clinical Impression:   Clinical Impression   Chest pain, unspecified type (Primary)   CKD (chronic kidney disease) stage 3, GFR 30-59 ml/min (CMS HCC)      ?   ?   Disposition: Discharged     Nelva Nay, DO

## 2021-05-19 NOTE — ED Nurses Note (Signed)
Pt provided with warm blanket and light has been turned out per pt request

## 2021-05-21 DIAGNOSIS — I517 Cardiomegaly: Secondary | ICD-10-CM

## 2021-05-21 LAB — ECG 12 LEAD
Atrial Rate: 68 {beats}/min
Calculated P Axis: 37 degrees
Calculated R Axis: 99 degrees
Calculated T Axis: -81 degrees
Calculated T Axis: -64 degrees
PR Interval: 184 ms
QRS Duration: 158 ms
QRS Duration: 158 ms
QTC Calculation: 510 ms
Ventricular rate: 68 {beats}/min
Ventricular rate: 68 {beats}/min

## 2021-05-31 ENCOUNTER — Encounter (INDEPENDENT_AMBULATORY_CARE_PROVIDER_SITE_OTHER): Payer: Self-pay | Admitting: Cardiovascular Disease

## 2021-05-31 ENCOUNTER — Ambulatory Visit (INDEPENDENT_AMBULATORY_CARE_PROVIDER_SITE_OTHER): Payer: Medicare (Managed Care) | Admitting: Cardiovascular Disease

## 2021-05-31 ENCOUNTER — Other Ambulatory Visit: Payer: Self-pay

## 2021-05-31 VITALS — BP 138/80 | HR 78 | Ht 68.0 in | Wt 182.0 lb

## 2021-05-31 DIAGNOSIS — I10 Essential (primary) hypertension: Secondary | ICD-10-CM

## 2021-05-31 DIAGNOSIS — R002 Palpitations: Secondary | ICD-10-CM

## 2021-05-31 DIAGNOSIS — R079 Chest pain, unspecified: Secondary | ICD-10-CM

## 2021-05-31 DIAGNOSIS — I2510 Atherosclerotic heart disease of native coronary artery without angina pectoris: Secondary | ICD-10-CM

## 2021-05-31 DIAGNOSIS — Z9861 Coronary angioplasty status: Secondary | ICD-10-CM

## 2021-05-31 LAB — ECG 12 LEAD - (AMB USE ONLY)(MUSE, IN CLINIC)
Atrial Rate: 78 {beats}/min
Calculated P Axis: 40 degrees
Calculated R Axis: 112 degrees
Calculated T Axis: -49 degrees
PR Interval: 186 ms
QRS Duration: 144 ms
QT Interval: 464 ms
QTC Calculation: 528 ms
Ventricular rate: 78 {beats}/min

## 2021-05-31 NOTE — Progress Notes (Signed)
[place]  [place] [id number]  Cardiology Progress Note    Max [name]., 51 y.o. male  MRN: [id number]  CSN: [id number]  Date of Birth: 1970/10/31  PCP: Azzie Roup, MD    Assessment/Plan/Recommendations:  Chest discomfort / palpitations / coronary artery disease / history of multiple PCI and stents in [place]:  This is a 51 year old gentleman with premature coronary artery disease and multiple percutaneous coronary interventions.  He has been experiencing noncardiac chest discomfort.  His discomfort during his prior MI and PCI was different than the discomfort that he is experiencing at this time.  He has been on nitrate.  He has had good blood pressure control.  His cardiac catheterization revealed significantly elevated left ventricular end-diastolic pressure in keeping with significant LV diastolic dysfunction.  He however is clinically compensated without any arrhythmias and has had no electrocardiographic abnormalities.  He has had a recent catheterization 2 months ago which did not reveal any obstructive epicardial coronary artery disease.  He may have an element of microvascular dysfunction but by history his chest discomfort sounds more noncardiac in nature.  I have therefore made no changes to his current medical therapy.  He is for ongoing observation.  The other option is should he have ongoing chest discomfort to switch his nitrate to a calcium channel blocker like amlodipine or nifedipine for both blood pressure and angina control.  He is unlikely to be benefited by ranolazine based on the history of his chest discomfort.  I have discussed these options with the patient.  He is agreeable for ongoing observation at this time.  The patient intends to move to [place] area and I would recommend that he follow-up with a cardiologist in [place] area.  Should he not move we will review him in the office in 3 months  time.      Chief Complaint: New Patient, Hypertension, Hyperlipidemia, and Diabetes      HPI: Max [name]. is a 51 y.o. year old male here today for cardiac assessment and management of known coronary artery disease.  This is a 51 year old gentleman with premature coronary artery disease, history of multiple percutaneous coronary interventions while in [place] the details of which were not available, recent coronary angiography from [place] center showing patent stents in LAD, mild disease of left main and circumflex coronary artery, chronic total occlusion of posterolateral branch with left-to-right collaterals and mild LV systolic dysfunction, hypertension, dyslipidemia, peripheral vascular disease, end-stage renal disease on hemodialysis, status post right below-knee amputation was here for management of chest discomfort.  The patient typically has 2-3 episodes of chest discomfort in a week.  It is situated in the left anterior chest wall.  There is occasional radiation of discomfort to the left shoulder and left upper extremity.  It lasts for a day or 2.  It is not relieved by nitroglycerin or rest.  He just tries to put up with it and the discomfort eases with time.  His blood pressure was acceptably controlled.  His physical exam revealed no evidence of heart failure.  He also volunteered a history of occasional palpitations, lightheadedness and feeling presyncopal.  He has had no complete loss of consciousness or any new focal neurologic deficits.  His electrocardiogram showed right bundle-branch block with nonspecific infero apical ST T wave abnormality which was unchanged from before.      PMH:  Past Medical History:   Diagnosis Date   .  Arthropathy    . CKD (chronic kidney disease), stage III (CMS HCC) 08/25/2019   . Congestive heart failure (CMS HCC)    . Coronary artery disease    . Diabetes mellitus, type 2 (CMS HCC)    . Headache    . Heart attack (CMS [facility]) 2020   . Heart attack  (CMS [place]) 2021   . Heart attack (CMS [place]) 09/2020   . History of stress test    . HTN (hypertension)    . Hx MRSA infection    . MI (myocardial infarction) (CMS HCC)            PSH:  Past Surgical History:   Procedure Laterality Date   . AMPUTATION FOOT / TOE Left    . CARDIAC CATHETERIZATION     . CORONARY ARTERY ANGIOPLASTY      12 stents placed   . HX CATARACT REMOVAL Bilateral    . LEG AMPUTATION Right 02/2021    Below the knee amputation           Medications:  Prior to Admission medications    Medication Sig Start Date End Date Taking? Authorizing Provider   aspirin (ECOTRIN) 81 mg Oral Tablet, Delayed Release (E.C.) Take 1 Tablet (81 mg total) by mouth Once a day    Provider, Historical   atorvastatin (LIPITOR) 80 mg Oral Tablet Take 1 Tablet (80 mg total) by mouth Every evening    Provider, Historical   bacitracin 500 unit/gram Packet Apply 1 Packet topically Twice per day as needed for Other  Patient not taking: Reported on 05/18/2021 03/09/21   Avel Peace, MD   carvediloL (COREG) 25 mg Oral Tablet Take 1 Tablet (25 mg total) by mouth Twice daily with food    Provider, Historical   cyclobenzaprine (FLEXERIL) 5 mg Oral Tablet Take 1 Tablet (5 mg total) by mouth Three times a day as needed for Muscle spasms 03/09/21   Avel Peace, MD   docusate sodium (COLACE) 100 mg Oral Capsule Take 1 Capsule (100 mg total) by mouth Twice daily  Patient not taking: Reported on 05/18/2021    Provider, Historical   DULoxetine (CYMBALTA DR) 60 mg Oral Capsule, Delayed Release(E.C.) Take 1 Capsule (60 mg total) by mouth Once a day  Patient not taking: Reported on 05/18/2021    Provider, Historical   enoxaparin (LOVENOX) 80 mg/0.8 mL Subcutaneous Syringe Inject 0.8 mL (80 mg total) under the skin Every 24 hours For "bridging" until INR is therapeutic  Patient not taking: Reported on 05/18/2021 04/06/21   Zane Herald, MD   esomeprazole magnesium (NEXIUM) 20 mg Oral Capsule, Delayed Release(E.C.) Take 1 Capsule (20 mg total) by mouth  Every morning before breakfast    Provider, Historical   ezetimibe (ZETIA) 10 mg Oral Tablet Take 1 Tablet (10 mg total) by mouth Every evening    Provider, Historical   ferrous sulfate (FERATAB) 324 mg (65 mg iron) Oral Tablet, Delayed Release (E.C.) Take 1 Tablet (324 mg total) by mouth  Patient not taking: Reported on 05/18/2021    Provider, Historical   gabapentin (NEURONTIN) 400 mg Oral Capsule Take 1 Capsule (400 mg total) by mouth Three times a day    Provider, Historical   insulin glargine-yfgn 100 unit/mL Subcutaneous Inject 8 Units under the skin Once a day  Patient not taking: Reported on 05/18/2021 03/10/21   Avel Peace, MD   insulin lispro 100 units/mL Subcutaneous Injectable Inject 0-12 Units under the skin Every 4 hours as  needed for Other 03/09/21   Avel Peace, MD   isosorbide mononitrate (IMDUR) 60 mg Oral Tablet Sustained Release 24 hr Take 1 Tablet (60 mg total) by mouth Every morning    Provider, Historical   lidocaine (LIDODERM) 5 % Adhesive Patch, Medicated Place 1 Patch (700 mg total) on the skin Once a day 03/09/21   Avel Peace, MD   magnesium oxide (MAG-OX) 400 mg Oral Tablet Take 1 Tablet (400 mg total) by mouth Twice daily 04/06/21   Zane Herald, MD   oxyCODONE (OXY IR) 5 mg Oral Capsule Take 1 Capsule (5 mg total) by mouth Every 8 hours as needed for Pain    Provider, Historical   Pharmacy to Dose Warfarin Pharmacy to dose 04/06/21   Zane Herald, MD   polyethylene glycol (MIRALAX) 17 gram Oral Powder in Packet Take 1 Packet (17 g total) by mouth Once a day 03/10/21   Avel Peace, MD   ranolazine (RANEXA) 500 mg Oral Tablet Sustained Release 12 hr Take 1 Tablet (500 mg total) by mouth Twice daily    Provider, Historical   sevelamer (RENAGEL) 800 mg Oral Tablet Take 1 Tablet (800 mg total) by mouth Three times daily with meals    Provider, Historical   sodium zirconium cyclosilicate (LOKELMA) 10 gram Oral Powder in Packet Take 1 Packet (10 g total) by mouth Once a day    Provider,  Historical   sucralfate (CARAFATE) 1 gram Oral Tablet Take 1 Tablet (1 g total) by mouth Three times daily before meals    Provider, Historical   torsemide (DEMADEX) 20 mg Oral Tablet Take 1 Tablet (20 mg total) by mouth Twice daily    Provider, Historical   Warfarin (COUMADIN) 3 mg Oral Tablet Take 1 Tablet (3 mg total) by mouth Once a day 03/09/21   Avel Peace, MD   Warfarin (COUMADIN) 6 mg Oral Tablet Take 1 Tablet (6 mg total) by mouth Now for 1 dose 04/06/21 05/18/21  Zane Herald, MD       Allergies:  Allergies   Allergen Reactions   . Toradol [Ketorolac] Rash   . Ultram [Tramadol] Rash   . Benadryl [Diphenhydramine]  Other Adverse Reaction (Add comment)     irritable   . Penicillin G Benethamine        Family History:  Family Medical History:     Problem Relation (Age of Onset)    Cancer Father    Coronary Artery Disease Mother, Father            Social History:  Social History     Tobacco Use   . Smoking status: Never   . Smokeless tobacco: Never   Substance Use Topics   . Alcohol use: Not Currently       ROS:    Cardiac:   Has chest discomfort, no dyspnea at rest, has palpitations, has felt presyncopal but no loss of consciousness    Respiratory:  No history of cough, no history of fever, no history of wheezing     GI:  No symptoms    Neurologic:  No history of visual or speech difficulty, no focal neurologic deficits    Musculoskeletal :  Has had right below-knee amputation, has low back ache    EXAM:    BP 138/80   Pulse 78   Ht 1.727 m (5\' 8" )   Wt 82.6 kg (182 lb)   SpO2 97%   BMI 27.67 kg/m     Constitutional: Comfortable in  no obvious distress:  Overweight for height    Eyes: No pallor,  No icterus.     Neck:  No JVD,  No audible carotid bruit.    Lungs: Air entry normal  bilaterally,  Normal breath sounds heard,  No adventitious sounds audible,      Cardiovascular:  PMI not felt,   Normal first and physiologically split second heart sounds, No obvious gallop,  No rub,  No audible  murmurs    Abdomen: Soft and non-tender abdomen,  Normal bowel sounds heard.    CNS: Nonfocal examination    Extremities: Warm extremities,  No edema left lower extremity, status post below-knee amputation right lower extremity,  upper extremity pulses equal bilaterally    Cardiac Studies:    ECGfrom May 31, 2021:  Sinus rhythm, left atrial abnormality, right bundle-branch block, rightward axis, nonspecific ST T wave abnormality - no significant change from May 21, 2021    ECG from May 21, 2021:  Sinus rhythm, complete right bundle-branch block with repolarization abnormality, low voltage in the standard limb leads, nonspecific inferior and apical T-wave abnormality    Echocardiogram from 03/02/2021:  Normal left ventricular size, concentric LVH, grade 3 LV diastolic dysfunction, RV not well visualized, mildly elevated RVSP,  Mild left atrial dilatation, mild degenerative changes noted in the mitral and aortic valves without stenosis, mild mitral and tricuspid regurgitation    Myocardial perfusion imaging from 03/23 2023:  Small partially reversible anterolateral defect, mild systolic dysfunction with estimated EF of 46%    Coronary angiography from 04/05/2021:  Left main coronary artery - mild disease:  Left anterior descending artery - mild InStent restenosis of proximal and mid LAD stents:  Circumflex coronary artery - mild disease:  Right coronary artery - 15-20% mid artery stenosis and chronic total occlusion of posterolateral branch with left-to-right collaterals:  LVEDP 28-30 mmHg    Labs:    CBC  Diff   Lab Results   Component Value Date/Time    WBC 6.5 05/19/2021 10:15 AM    HGB 10.0 (L) 05/19/2021 10:15 AM    HCT 30.2 (L) 05/19/2021 10:15 AM    PLTCNT 253 05/19/2021 10:15 AM    RBC 3.61 (L) 05/19/2021 10:15 AM    MCV 83.8 05/19/2021 10:15 AM    MCHC 33.0 05/19/2021 10:15 AM    MCH 27.6 05/19/2021 10:15 AM    RDW 17.6 (H) 05/19/2021 10:15 AM    MPV 9.6 04/06/2021 05:33 AM    Lab Results   Component Value  Date/Time    PMNS 51 05/19/2021 10:15 AM    LYMPHOCYTES 23 05/19/2021 10:15 AM    EOSINOPHIL 6 05/19/2021 10:15 AM    MONOCYTES 19 (H) 05/19/2021 10:15 AM    BASOPHILS 1 05/19/2021 10:15 AM    BASOPHILS 0.10 05/19/2021 10:15 AM    PMNABS 3.30 05/19/2021 10:15 AM    LYMPHSABS 1.50 05/19/2021 10:15 AM    EOSABS 0.40 05/19/2021 10:15 AM    MONOSABS 1.20 (H) 05/19/2021 10:15 AM          BASIC METABOLIC PANEL  Lab Results   Component Value Date    SODIUM 135 (L) 05/19/2021    POTASSIUM 3.8 05/19/2021    CHLORIDE 99 05/19/2021    CO2 28 05/19/2021    ANIONGAP 8 05/19/2021    BUN 14 05/19/2021    CREATININE 2.52 (H) 05/19/2021    BUNCRRATIO 6 05/19/2021    GFR 31 (L) 05/19/2021    CALCIUM 9.1 05/19/2021  GLUCOSENF 181 (H) 05/19/2021        Lab Results   Component Value Date    CHOLESTEROL 185 08/26/2019    LDLCHOL  08/26/2019      Comment:      CALCULATION FOR LDL/VLDL-CHOLESTEROL IS INACCURATE WHEN  TRIGLYCERIDE RESULT IS GREATER THAN 400 mg/dL  <100 mg/dL, Optimal  100-129 mg/dL, Near/Above Optimal  130-159 mg/dL, Borderline High  160-189 mg/dL, High  >=190 mg/dL, Very high    TRIG 424 (H) 08/26/2019       Maureen Ralphs MD [place] [facility]

## 2021-06-15 ENCOUNTER — Encounter (HOSPITAL_BASED_OUTPATIENT_CLINIC_OR_DEPARTMENT_OTHER): Payer: Medicare (Managed Care)

## 2022-09-13 ENCOUNTER — Encounter (HOSPITAL_COMMUNITY): Payer: Self-pay

## 2023-09-11 DEATH — deceased
